# Patient Record
Sex: Male | Born: 1965 | Race: Black or African American | Hispanic: No | Marital: Married | Smoking: Former smoker
Health system: Southern US, Community
[De-identification: ages and names within clinical notes are randomized; demographics above are authoritative.]

## PROBLEM LIST (undated history)

## (undated) DIAGNOSIS — C9110 Chronic lymphocytic leukemia of B-cell type not having achieved remission: Secondary | ICD-10-CM

## (undated) DIAGNOSIS — T4145XA Adverse effect of unspecified anesthetic, initial encounter: Secondary | ICD-10-CM

## (undated) DIAGNOSIS — I10 Essential (primary) hypertension: Secondary | ICD-10-CM

## (undated) DIAGNOSIS — T8859XA Other complications of anesthesia, initial encounter: Secondary | ICD-10-CM

## (undated) DIAGNOSIS — N529 Male erectile dysfunction, unspecified: Secondary | ICD-10-CM

## (undated) DIAGNOSIS — E785 Hyperlipidemia, unspecified: Secondary | ICD-10-CM

## (undated) DIAGNOSIS — G4730 Sleep apnea, unspecified: Secondary | ICD-10-CM

## (undated) DIAGNOSIS — E119 Type 2 diabetes mellitus without complications: Secondary | ICD-10-CM

## (undated) HISTORY — PX: KNEE SURGERY: SHX244

## (undated) HISTORY — DX: Sleep apnea, unspecified: G47.30

## (undated) HISTORY — PX: ORIF PATELLA: SHX5033

## (undated) HISTORY — PX: COLONOSCOPY: SHX174

## (undated) HISTORY — PX: OTHER SURGICAL HISTORY: SHX169

## (undated) MED FILL — Dexamethasone Sodium Phosphate Inj 100 MG/10ML: INTRAMUSCULAR | Qty: 2 | Status: AC

---
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
## 2019-04-15 DIAGNOSIS — Z8616 Personal history of COVID-19: Secondary | ICD-10-CM

## 2019-04-15 HISTORY — DX: Personal history of COVID-19: Z86.16

## 2019-05-11 ENCOUNTER — Ambulatory Visit (HOSPITAL_COMMUNITY)
Admission: EM | Admit: 2019-05-11 | Discharge: 2019-05-11 | Disposition: A | Discharge status: Home / Self Care | Payer: 59 | Attending: Emergency Medicine | Admitting: Emergency Medicine

## 2019-05-11 ENCOUNTER — Other Ambulatory Visit: Payer: Self-pay

## 2019-05-11 ENCOUNTER — Encounter (HOSPITAL_COMMUNITY): Payer: Self-pay | Admitting: *Deleted

## 2019-05-11 DIAGNOSIS — J302 Other seasonal allergic rhinitis: Secondary | ICD-10-CM | POA: Insufficient documentation

## 2019-05-11 DIAGNOSIS — Z20828 Contact with and (suspected) exposure to other viral communicable diseases: Secondary | ICD-10-CM | POA: Diagnosis present

## 2019-05-11 DIAGNOSIS — Z20822 Contact with and (suspected) exposure to covid-19: Secondary | ICD-10-CM

## 2019-05-11 DIAGNOSIS — U071 COVID-19: Secondary | ICD-10-CM | POA: Insufficient documentation

## 2019-05-11 HISTORY — DX: Essential (primary) hypertension: I10

## 2019-05-11 HISTORY — DX: Hyperlipidemia, unspecified: E78.5

## 2019-05-11 HISTORY — DX: Type 2 diabetes mellitus without complications: E11.9

## 2019-05-11 NOTE — ED Provider Notes (Signed)
[place]    CSN: [id number] Arrival date & time: 05/11/19  3716     History   Chief Complaint No chief complaint on file.   HPI Ramces Shomaker is a 53 y.o. male with history of tobacco use, type 2 diabetes, hyperlipidemia, hypertension presenting for COVID testing.  States that his son, with whom he lives, tested positive on Saturday.  Patient has been well until 2 days ago, at which point he developed some nasal congestion, itchy/watery eyes.  Patient states that he sleeps under the fan at home and tends to get allergy flares around this time a year: Has not taken allergy medications yet.  Patient denies fever, myalgias, cough, shortness of breath.   Past Medical History:  Diagnosis Date  . Diabetes mellitus without complication ([place])   . Hyperlipidemia   . Hypertension     There are no active problems to display for this patient.   Past Surgical History:  Procedure Laterality Date  . ORIF PATELLA    . QUADRICEPS REPAIR         Home Medications    Prior to Admission medications   Medication Sig Start Date End Date Taking? Authorizing Provider  AMLODIPINE BENZOATE PO Take by mouth.   Yes [provider]  ASPIRIN PO Take by mouth.   Yes [provider]  Atorvastatin Calcium (LIPITOR PO) Take by mouth.   Yes [provider]  Empagliflozin (JARDIANCE PO) Take by mouth.   Yes [provider]  HYDROCHLOROTHIAZIDE PO Take by mouth.   Yes [provider]  Insulin Glargine (LANTUS [place]) Inject into the skin.   Yes [provider]  Liraglutide (VICTOZA [place]) Inject into the skin.   Yes [provider]  LOSARTAN POTASSIUM PO Take by mouth.   Yes [provider]  METFORMIN HCL PO Take by mouth.   Yes [provider]  METOPROLOL SUCCINATE PO Take by mouth.   Yes [provider]  SPIRONOLACTONE PO Take by mouth.   Yes [provider]  Tadalafil (CIALIS PO) Take by mouth.    Yes [provider]    Family History Family History  Problem Relation Age of Onset  . Cancer Mother   . Hypertension Mother   . Diabetes Mother   . Venous thrombosis Son     Social History Social History   Tobacco Use  . Smoking status: Current Every Day Smoker  . Smokeless tobacco: Never Used  Substance Use Topics  . Alcohol use: Yes    Comment: occasionally  . Drug use: Never     Allergies   Patient has no known allergies.   Review of Systems Review of Systems  Constitutional: Positive for fever. Negative for activity change and fatigue.  HENT: Positive for congestion, postnasal drip and rhinorrhea. Negative for dental problem, drooling, ear discharge, ear pain, facial swelling, hearing loss, mouth sores, nosebleeds, sinus pressure, sinus pain, sneezing, sore throat, tinnitus, trouble swallowing and voice change.   Eyes: Positive for discharge and itching. Negative for pain and redness.       Clear/watery discharge bilaterally  Respiratory: Negative for cough and shortness of breath.   Cardiovascular: Negative for chest pain and palpitations.  Gastrointestinal: Negative for abdominal pain, diarrhea, nausea and vomiting.  Endocrine: Negative for polydipsia, polyphagia and polyuria.  Genitourinary: Negative for dysuria, frequency and urgency.  Musculoskeletal: Negative for arthralgias and myalgias.  Skin: Negative for rash and wound.  Neurological: Negative for speech difficulty, light-headedness and headaches.  All other systems reviewed and are negative.    Physical Exam Triage Vital Signs ED Triage Vitals  Enc Vitals Group     BP      Pulse      Resp      Temp      Temp src      SpO2      Weight      Height      Head Circumference      Peak Flow      Pain Score      Pain Loc      Pain Edu?      Excl. in [place]?    No data found.  Updated Vital Signs BP 138/85   Pulse 74   Temp 99.5 F (37.5 C) (Temporal)   Resp 18   SpO2 99%     Physical Exam Constitutional:      General: He is not in acute distress. HENT:     Head: Normocephalic and atraumatic.     Right Ear: Tympanic membrane, ear canal and external ear normal.     Left Ear: Tympanic membrane, ear canal and external ear normal.     Nose: Rhinorrhea present.     Comments: Bilateral turbinate edema with mucosal pallor    Mouth/Throat:     Mouth: Mucous membranes are moist.     Pharynx: Oropharynx is clear. No oropharyngeal exudate or posterior oropharyngeal erythema.  Eyes:     General: No scleral icterus.    Extraocular Movements: Extraocular movements intact.     Pupils: Pupils are equal, round, and reactive to light.     Comments: Trace conjunctival injection bilaterally without edema or purulent discharge.  Neck:     Musculoskeletal: Neck supple. No muscular tenderness.  Cardiovascular:     Rate and Rhythm: Normal rate.  Pulmonary:     Effort: Pulmonary effort is normal.  Lymphadenopathy:     Cervical: No cervical adenopathy.  Skin:    Coloration: Skin is not jaundiced or pale.  Neurological:     Mental Status: He is alert and oriented to person, place, and time.      UC Treatments / Results  Labs (all labs ordered are listed, but only abnormal results are displayed) Labs Reviewed  NOVEL CORONAVIRUS, NAA (HOSPITAL ORDER, SEND-OUT TO REF LAB)    EKG   Radiology No results found.  Procedures Procedures (including critical care time)  Medications Ordered in UC Medications - No data to display  Initial Impression / Assessment and Plan / UC Course  I have reviewed the triage vital signs and the nursing notes.  Pertinent labs & imaging results that were available during my care of the patient were reviewed by me and considered in my medical decision making (see chart for details).     1.  Seasonal allergic rhinitis Will treat with OTC allergy occasions including Flonase, oral and topical ophthalmic antihistamines.  2.  Close  exposure to COVID-19 COVID test pending, encourage patient to take Tylenol for any elevated temperatures, or should he develop myalgias.  Return precautions discussed, patient verbalized understanding and is agreeable to plan. Final Clinical Impressions(s) / UC Diagnoses   Final diagnoses:  Seasonal allergic rhinitis, unspecified trigger  Close Exposure to Covid-19 Virus     Discharge Instructions     COVID test pending: Important to stay home and isolate until test results are back. Return if you develop fever, cough, shortness of breath.  Important to take allergy medication,  Flonase intranasal steroid, allergy eyedrops for symptom relief.    ED Prescriptions    None     Controlled Substance Prescriptions [place] Controlled Substance Registry consulted? Not Applicable   Quincy Sheehan, [place] 05/11/19 9791

## 2019-05-11 NOTE — ED Triage Notes (Addendum)
Requesting Covid test, as son (living in same household) tested positive.  Denies any c/o's, but noted bilat eye redness and some nasal congestion.

## 2019-05-11 NOTE — Discharge Instructions (Addendum)
COVID test pending: Important to stay home and isolate until test results are back. Return if you develop fever, cough, shortness of breath.  Important to take allergy medication, Flonase intranasal steroid, allergy eyedrops for symptom relief.

## 2019-05-13 LAB — NOVEL CORONAVIRUS, NAA (HOSP ORDER, SEND-OUT TO REF LAB; TAT 18-24 HRS): SARS-CoV-2, NAA: DETECTED — AB

## 2019-05-14 ENCOUNTER — Telehealth (HOSPITAL_COMMUNITY): Payer: Self-pay | Admitting: Emergency Medicine

## 2019-05-14 NOTE — Telephone Encounter (Signed)
Your test for COVID-19 was positive, meaning that you were infected with the novel coronavirus and could give the germ to others.  Please continue isolation at home, for at least 10 days since the start of your fever/cough/breathlessness and until you have had 3 consecutive days without fever (without taking a fever reducer) and with cough/breathlessness improving. Please continue good preventive care measures, including:  frequent hand-washing, avoid touching your face, cover coughs/sneezes, stay out of crowds and keep a 6 foot distance from others.  Recheck or go to the nearest hospital ED tent for re-assessment if fever/cough/breathlessness return.  Patient contacted and made aware of    results, all questions answered

## 2019-05-22 ENCOUNTER — Ambulatory Visit (HOSPITAL_COMMUNITY)
Admission: EM | Admit: 2019-05-22 | Discharge: 2019-05-22 | Disposition: A | Discharge status: Home / Self Care | Payer: 59 | Attending: Family Medicine | Admitting: Family Medicine

## 2019-05-22 ENCOUNTER — Other Ambulatory Visit: Payer: Self-pay

## 2019-05-22 ENCOUNTER — Encounter (HOSPITAL_COMMUNITY): Payer: Self-pay | Admitting: Emergency Medicine

## 2019-05-22 DIAGNOSIS — E119 Type 2 diabetes mellitus without complications: Secondary | ICD-10-CM | POA: Diagnosis not present

## 2019-05-22 DIAGNOSIS — Z7982 Long term (current) use of aspirin: Secondary | ICD-10-CM | POA: Insufficient documentation

## 2019-05-22 DIAGNOSIS — Z794 Long term (current) use of insulin: Secondary | ICD-10-CM | POA: Diagnosis not present

## 2019-05-22 DIAGNOSIS — U071 COVID-19: Secondary | ICD-10-CM | POA: Diagnosis present

## 2019-05-22 DIAGNOSIS — Z79899 Other long term (current) drug therapy: Secondary | ICD-10-CM | POA: Diagnosis not present

## 2019-05-22 DIAGNOSIS — Z20828 Contact with and (suspected) exposure to other viral communicable diseases: Secondary | ICD-10-CM

## 2019-05-22 DIAGNOSIS — I10 Essential (primary) hypertension: Secondary | ICD-10-CM

## 2019-05-22 DIAGNOSIS — F17200 Nicotine dependence, unspecified, uncomplicated: Secondary | ICD-10-CM | POA: Insufficient documentation

## 2019-05-22 DIAGNOSIS — Z20822 Contact with and (suspected) exposure to covid-19: Secondary | ICD-10-CM

## 2019-05-22 DIAGNOSIS — Z0289 Encounter for other administrative examinations: Secondary | ICD-10-CM | POA: Diagnosis not present

## 2019-05-22 DIAGNOSIS — E785 Hyperlipidemia, unspecified: Secondary | ICD-10-CM | POA: Insufficient documentation

## 2019-05-22 NOTE — Discharge Instructions (Signed)
MAY RETURN TO WORK NEXT WEEK ONCE CONFIRMATORY NEGATIVE TEST RETURNS

## 2019-05-22 NOTE — ED Provider Notes (Signed)
[place]    CSN: [id number] Arrival date & time: 05/22/19  9767      History   Chief Complaint Chief Complaint  Patient presents with  . covid test  . Letter for School/Work    HPI Lilburn Straw is a 53 y.o. male history of hypertension, hyperlipidemia, DM type II presenting today for evaluation of cover testing.  Patient tested positive on 7/27 has been quarantining at home for the past 2 weeks.  Initially had some mild URI symptoms and some mild diarrhea, but this resolved after 3 to 4 days and has felt normal over the past 7 to 10 days.  He denies any fevers.  Denies chills, body aches.  Tolerating oral intake.  Denies cough, congestion or sore throat.  His son is also tested positive, but recently had a negative result.  He is returning for COVID testing to ensure safety to return to work.   HPI  Past Medical History:  Diagnosis Date  . Diabetes mellitus without complication ([place])   . Hyperlipidemia   . Hypertension     There are no active problems to display for this patient.   Past Surgical History:  Procedure Laterality Date  . ORIF PATELLA    . QUADRICEPS REPAIR         Home Medications    Prior to Admission medications   Medication Sig Start Date End Date Taking? Authorizing Provider  AMLODIPINE BENZOATE PO Take by mouth.    [provider]  ASPIRIN PO Take by mouth.    [provider]  Atorvastatin Calcium (LIPITOR PO) Take by mouth.    [provider]  Empagliflozin (JARDIANCE PO) Take by mouth.    [provider]  HYDROCHLOROTHIAZIDE PO Take by mouth.    [provider]  Insulin Glargine (LANTUS [place]) Inject into the skin.    [provider]  Liraglutide (VICTOZA [place]) Inject into the skin.    [provider]  LOSARTAN POTASSIUM PO Take by mouth.    [provider]  METFORMIN HCL PO Take by mouth.    [provider]  METOPROLOL SUCCINATE PO Take by mouth.     [provider]  SPIRONOLACTONE PO Take by mouth.    [provider]  Tadalafil (CIALIS PO) Take by mouth.    [provider]    Family History Family History  Problem Relation Age of Onset  . Cancer Mother   . Hypertension Mother   . Diabetes Mother   . Venous thrombosis Son     Social History Social History   Tobacco Use  . Smoking status: Current Every Day Smoker  . Smokeless tobacco: Never Used  Substance Use Topics  . Alcohol use: Yes    Comment: occasionally  . Drug use: Never     Allergies   Patient has no known allergies.   Review of Systems Review of Systems  Constitutional: Negative for activity change, appetite change, chills, fatigue and fever.  HENT: Negative for congestion, ear pain, rhinorrhea, sinus pressure, sore throat and trouble swallowing.   Eyes: Negative for discharge and redness.  Respiratory: Negative for cough, chest tightness and shortness of breath.   Cardiovascular: Negative for chest pain.  Gastrointestinal: Negative for abdominal pain, diarrhea, nausea and vomiting.  Musculoskeletal: Negative for myalgias.  Skin: Negative for rash.  Neurological: Negative for dizziness, light-headedness and headaches.     Physical Exam Triage Vital Signs ED Triage Vitals [05/22/19 1022]  Enc Vitals Group  BP (!) 154/95     Pulse Rate 67     Resp 18     Temp 98.1 F (36.7 C)     Temp Source Temporal     SpO2 97 %     Weight      Height      Head Circumference      Peak Flow      Pain Score 0     Pain Loc      Pain Edu?      Excl. in [place]?    No data found.  Updated Vital Signs BP (!) 154/95 (BP Location: Left Arm)   Pulse 67   Temp 98.1 F (36.7 C) (Temporal)   Resp 18   SpO2 97%   Visual Acuity Right Eye Distance:   Left Eye Distance:   Bilateral Distance:    Right Eye Near:   Left Eye Near:    Bilateral Near:     Physical Exam Vitals signs and nursing note reviewed.  Constitutional:       Appearance: He is well-developed.     Comments: No acute distress, well-appearing  HENT:     Head: Normocephalic and atraumatic.  Eyes:     Conjunctiva/sclera: Conjunctivae normal.  Neck:     Musculoskeletal: Neck supple.  Cardiovascular:     Rate and Rhythm: Normal rate and regular rhythm.     Heart sounds: No murmur.  Pulmonary:     Effort: Pulmonary effort is normal. No respiratory distress.     Breath sounds: Normal breath sounds.     Comments: Breathing comfortably at rest, CTABL, no wheezing, rales or other adventitious sounds auscultated  No coughing during visit Abdominal:     Palpations: Abdomen is soft.     Tenderness: There is no abdominal tenderness.  Skin:    General: Skin is warm and dry.  Neurological:     Mental Status: He is alert.      UC Treatments / Results  Labs (all labs ordered are listed, but only abnormal results are displayed) Labs Reviewed  NOVEL CORONAVIRUS, NAA (HOSPITAL ORDER, SEND-OUT TO REF LAB)    EKG   Radiology No results found.  Procedures Procedures (including critical care time)  Medications Ordered in UC Medications - No data to display  Initial Impression / Assessment and Plan / UC Course  I have reviewed the triage vital signs and the nursing notes.  Pertinent labs & imaging results that were available during my care of the patient were reviewed by me and considered in my medical decision making (see chart for details).     Repeat COVID testing obtained.  Vital signs stable.  Advised patient he may return to remote pending negative testing.  Work forms filled out, feel patient is safe to return with negative testing.Discussed strict return precautions. Patient verbalized understanding and is agreeable with plan.  Final Clinical Impressions(s) / UC Diagnoses   Final diagnoses:  Suspected Covid-19 Virus Infection     Discharge Instructions     MAY RETURN TO WORK NEXT WEEK ONCE CONFIRMATORY NEGATIVE TEST RETURNS    ED Prescriptions    None     Controlled Substance Prescriptions [place] Controlled Substance Registry consulted? Not Applicable   Janith Lima, [place] 05/22/19 1049

## 2019-05-22 NOTE — ED Triage Notes (Signed)
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

## 2019-05-25 ENCOUNTER — Telehealth (HOSPITAL_COMMUNITY): Payer: Self-pay | Admitting: Emergency Medicine

## 2019-05-25 LAB — NOVEL CORONAVIRUS, NAA (HOSP ORDER, SEND-OUT TO REF LAB; TAT 18-24 HRS): SARS-CoV-2, NAA: DETECTED — AB

## 2019-05-25 NOTE — Telephone Encounter (Signed)
Spoke with patient on the phone, pt states he has no symptoms whatsoever and hasn't had symptoms for over ten days. Per CDC guidelines, detected after this time could be false positive from old virus, it does NOT mean pt is till contagious. Reviewed chart with Dr. Meda Coffee, pt meets criteria to return to work based on no symptoms for over 10 days, no fever, and symptoms 100% resolved. Wrote patient a work note stating he meets CDC guidelines and no longer needs to quarantine. All questions answered.

## 2019-07-09 ENCOUNTER — Other Ambulatory Visit: Payer: Self-pay

## 2019-07-13 ENCOUNTER — Ambulatory Visit: Payer: 59 | Admitting: Internal Medicine

## 2019-07-13 ENCOUNTER — Other Ambulatory Visit: Payer: Self-pay

## 2019-07-13 ENCOUNTER — Encounter: Payer: Self-pay | Admitting: Internal Medicine

## 2019-07-13 VITALS — BP 138/86 | HR 86 | Temp 98.3°F | Ht 71.89 in | Wt 265.8 lb

## 2019-07-13 DIAGNOSIS — R739 Hyperglycemia, unspecified: Secondary | ICD-10-CM

## 2019-07-13 DIAGNOSIS — E1165 Type 2 diabetes mellitus with hyperglycemia: Secondary | ICD-10-CM

## 2019-07-13 DIAGNOSIS — Z794 Long term (current) use of insulin: Secondary | ICD-10-CM | POA: Diagnosis not present

## 2019-07-13 LAB — POCT GLYCOSYLATED HEMOGLOBIN (HGB A1C): Hemoglobin A1C: 7.7 % — AB (ref 4.0–5.6)

## 2019-07-13 MED ORDER — OZEMPIC (0.25 OR 0.5 MG/DOSE) 2 MG/1.5ML ~~LOC~~ SOPN: 0.5000 mg | PEN_INJECTOR | SUBCUTANEOUS | 6 refills | Status: DC

## 2019-07-13 NOTE — Patient Instructions (Addendum)
-   Stop Victoza  - Start Ozempic 0.5 mg weekly  - Continue Jardiance 25 mg daily  - Continue Metformin 1000 mg Twice daily  - Decrease Lantus to 36 units daily     - Check sugar fasting and bedtime    - HOW TO TREAT LOW BLOOD SUGARS (Blood sugar LESS THAN 70 MG/DL)  Please follow the RULE OF 15 for the treatment of hypoglycemia treatment (when your (blood sugars are less than 70 mg/dL)    STEP 1: Take 15 grams of carbohydrates when your blood sugar is low, which includes:   3-4 GLUCOSE TABS  OR  3-4 OZ OF JUICE OR REGULAR SODA OR  ONE TUBE OF GLUCOSE GEL     STEP 2: RECHECK blood sugar in 15 MINUTES STEP 3: If your blood sugar is still low at the 15 minute recheck --> then, go back to STEP 1 and treat AGAIN with another 15 grams of carbohydrates.

## 2019-07-13 NOTE — Progress Notes (Signed)
Name: James Parrish  MRN/ DOB: [id number], 1966/07/21   Age/ Sex: 53 y.o., male    PCP: System, Provider Not In   Reason for Endocrinology Evaluation: Type 2 Diabetes Mellitus     Date of Initial Endocrinology Visit: 07/13/2019     PATIENT IDENTIFIER: James Parrish is a 53 y.o. male with a past medical history of HTN, Dyslipidemia . The patient presented for initial endocrinology clinic visit on 07/13/2019 for consultative assistance with his diabetes management.    HPI: James Parrish was    Diagnosed with DM > 10 yrs ago  Prior Medications tried/Intolerance: insulin started in 2013 Currently checking blood sugars 3 x / day,  before meals  Hypoglycemia episodes : no                Hemoglobin A1c has ranged from 7.2% in 2018, peaking at 8.8% in 2019. Patient required assistance for hypoglycemia: no Patient has required hospitalization within the last 1 year from hyper or hypoglycemia: no  In terms of diet, the patient eats 2-3 meals, snacks at bedtime . Avoids sugar-sweetened beverages.    Truck driver    HOME DIABETES REGIMEN: Lantus 40 units daily  Victoza 1.8 mg daily  Metformin 1000 mg BID  Jardiance 25 mg daily   Statin: Yes  ACE-I/ARB: Yes Prior Diabetic Education: No   METER DOWNLOAD SUMMARY:unable to download 102- 180's   Mg/dL   DIABETIC COMPLICATIONS: Microvascular complications:    Denies: CKD, retinopathy, neuropathy   Last eye exam: Completed 2020  Macrovascular complications:    Denies: CAD, PVD, CVA   PAST HISTORY: Past Medical History:  Past Medical History:  Diagnosis Date  . Diabetes mellitus without complication ([place])   . Hyperlipidemia   . Hypertension     Past Surgical History:  Past Surgical History:  Procedure Laterality Date  . ORIF PATELLA    . QUADRICEPS REPAIR        Social History:  reports that he has been smoking. He has never used smokeless tobacco. He reports current alcohol use. He reports that he does not use  drugs. Family History:  Family History  Problem Relation Age of Onset  . Cancer Mother   . Hypertension Mother   . Diabetes Mother   . Venous thrombosis Son       HOME MEDICATIONS: Allergies as of 07/13/2019   No Known Allergies     Medication List       Accurate as of July 13, 2019  2:55 PM. If you have any questions, ask your nurse or doctor.        AMLODIPINE BENZOATE PO Take by mouth.   ASPIRIN PO Take 81 mg by mouth daily.   CIALIS PO Take by mouth.   HYDROCHLOROTHIAZIDE PO Take by mouth.   JARDIANCE PO Take by mouth.   LANTUS [place] Inject 40 Units into the skin.   LIPITOR PO Take by mouth.   LOSARTAN POTASSIUM PO Take by mouth.   METFORMIN HCL PO Take 1,000 mg by mouth. 2 pills 2 times daily   METOPROLOL SUCCINATE PO Take by mouth.   SPIRONOLACTONE PO Take by mouth.   VICTOZA [place] Inject 1.8 Units into the skin.        ALLERGIES: No Known Allergies   REVIEW OF SYSTEMS: A comprehensive ROS was conducted with the patient and is negative except as per HPI and below:  Review of Systems  Constitutional: Negative for chills and fever.  HENT: Negative for congestion  and sore throat.   Respiratory: Negative for cough and hemoptysis.   Cardiovascular: Negative for chest pain and palpitations.  Gastrointestinal: Negative for nausea and vomiting.  Genitourinary: Negative for frequency.  Skin: Negative.   Neurological: Negative for tingling and tremors.  Endo/Heme/Allergies: Negative for polydipsia.  Psychiatric/Behavioral: Negative for depression.      OBJECTIVE:   VITAL SIGNS: BP 138/86 (BP Location: Left Arm, Patient Position: Sitting, Cuff Size: Large)   Pulse 86   Temp 98.3 F (36.8 C)   Ht 5' 11.89" (1.826 m)   Wt 265 lb 12.8 oz (120.6 kg)   SpO2 98%   BMI 36.16 kg/m    PHYSICAL EXAM:  General: Pt appears well and is in NAD  Hydration: Well-hydrated with moist mucous membranes and good skin turgor  HEENT: Head:  Unremarkable with good dentition. Oropharynx clear without exudate.  Eyes: External eye exam normal without stare, lid lag or exophthalmos.  EOM intact.  Neck: General: Supple without adenopathy or carotid bruits. Thyroid: Thyroid size normal.  No goiter or nodules appreciated. No thyroid bruit.  Lungs: Clear with good BS bilat with no rales, rhonchi, or wheezes  Heart: RRR with normal S1 and S2 and no gallops; no murmurs; no rub  Abdomen: Normoactive bowel sounds, soft, nontender, without masses or organomegaly palpable  Extremities:  Lower extremities - No pretibial edema. No lesions.  Skin: Normal texture and temperature to palpation. No rash noted. No Acanthosis nigricans/skin tags. Bilateral lower quadrant  lipohypertrophy.  Neuro: MS is good with appropriate affect, pt is alert and Ox3    DM foot exam: 07/13/2019  The skin of the feet is intact without sores or ulcerations. The pedal pulses are 2+ on right and 2+ on left. The sensation is intact to a screening 5.07, 10 gram monofilament bilaterally   DATA REVIEWED:    Results for James Parrish, James Parrish (MRN [id number]) as of 07/14/2019 15:04  Ref. Range 07/13/2019 16:09  Sodium Latest Ref Range: 135 - 145 mEq/L 136  Potassium Latest Ref Range: 3.5 - 5.1 mEq/L 3.9  Chloride Latest Ref Range: 96 - 112 mEq/L 101  CO2 Latest Ref Range: 19 - 32 mEq/L 24  Glucose Latest Ref Range: 70 - 99 mg/dL 127 (H)  BUN Latest Ref Range: 6 - 23 mg/dL 21  Creatinine Latest Ref Range: 0.40 - 1.50 mg/dL 1.10  Calcium Latest Ref Range: 8.4 - 10.5 mg/dL 10.1  GFR Latest Ref Range: >60.00 mL/min 83.92   ASSESSMENT / PLAN / RECOMMENDATIONS:   1) Type 2 Diabetes Mellitus, Sub-optimally controlled, Without complications - Most recent A1c of 7.7  %. Goal A1c < 7.0 %.    - Praised the patient on improved glucose control , his A1c has trended down from 8.2% - Pt does need dietary education, will refer him to our CDE.  - We discussed switching victoza to a once  weekly GLP-1 agonist, which he agreed to . - We also discussed avoiding the lower abdominal quadrants as injection sites for the next 6 months due to lipohyptrophy  MEDICATIONS: - Stop Victoza  - Start Ozempic 0.5 mg weekly  - Continue Jardiance 25 mg daily  - Continue Metformin 1000 mg Twice daily  - Decrease Lantus to 36 units daily   EDUCATION / INSTRUCTIONS:  BG monitoring instructions: Patient is instructed to check his blood sugars 2 times a day, fasting and bedtime.  Call [facility] if: BG persistently < 70 or > 300. . I reviewed the Rule of  15 for the treatment of hypoglycemia in detail with the patient. Literature supplied.   2) Diabetic complications:   Eye: Does not have known diabetic retinopathy.   Neuro/ Feet: Does not have known diabetic peripheral neuropathy.  Renal: Patient does not have known baseline CKD. He is on an ACEI/ARB at present.Check urine albumin/creatinine ratio yearly starting at time of diagnosis. If albuminuria is positive, treatment is geared toward better glucose, blood pressure control and use of ACE inhibitors or ARBs. Monitor electrolytes and creatinine once to twice yearly.   3) Lipids: Patient is  on a statin.    4) Hypertension: He is  at goal of < 140/90 mmHg.    F/u in 3 months    Signed electronically by: Mack Guise, MD  [facility] End[facility]  [facility] [address] [place], [place] [address] Phone: [phone] FAX: [phone]   CC: System, Provider Not In No address on file Phone: None  Fax: None    Return to Endocrinology clinic as below: Future Appointments  Date Time Provider [place]  07/13/2019  3:20 PM Shamleffer, Melanie Crazier, MD [facility] None

## 2019-07-14 ENCOUNTER — Encounter: Payer: Self-pay | Admitting: Internal Medicine

## 2019-07-14 LAB — BASIC METABOLIC PANEL
BUN: 21 mg/dL (ref 6–23)
CO2: 24 mEq/L (ref 19–32)
Calcium: 10.1 mg/dL (ref 8.4–10.5)
Chloride: 101 mEq/L (ref 96–112)
Creatinine, Ser: 1.1 mg/dL (ref 0.40–1.50)
GFR: 83.92 mL/min (ref >60.00)
Glucose, Bld: 127 mg/dL — ABNORMAL HIGH (ref 70–99)
Potassium: 3.9 mEq/L (ref 3.5–5.1)
Sodium: 136 mEq/L (ref 135–145)

## 2019-08-25 ENCOUNTER — Ambulatory Visit: Payer: 59 | Admitting: Registered"

## 2019-09-08 ENCOUNTER — Ambulatory Visit: Payer: 59 | Admitting: Dietician

## 2019-10-13 ENCOUNTER — Ambulatory Visit: Payer: 59 | Admitting: Internal Medicine

## 2019-10-13 ENCOUNTER — Other Ambulatory Visit: Payer: Self-pay

## 2019-10-13 VITALS — BP 138/92 | HR 93 | Temp 98.1°F | Ht 73.0 in | Wt 273.6 lb

## 2019-10-13 DIAGNOSIS — Z794 Long term (current) use of insulin: Secondary | ICD-10-CM | POA: Diagnosis not present

## 2019-10-13 DIAGNOSIS — E1165 Type 2 diabetes mellitus with hyperglycemia: Secondary | ICD-10-CM | POA: Diagnosis not present

## 2019-10-13 LAB — POCT GLYCOSYLATED HEMOGLOBIN (HGB A1C): Hemoglobin A1C: 7.2 % — AB (ref 4.0–5.6)

## 2019-10-13 NOTE — Progress Notes (Signed)
Name: Danial Rueff  Age/ Sex: 53 y.o., male   MRN/ DOB: [id number], April 02, 1966     PCP: Craig Guess, MD   Reason for Endocrinology Evaluation: Type 2 Diabetes Mellitus  Initial Endocrine Consultative Visit: 07/13/2019    PATIENT IDENTIFIER: Mr. Harjot Palmateer is a 53 y.o. male with a past medical history of T2Dm, HTN and Dyslipidemia. The patient has followed with Endocrinology clinic since 07/13/2019 for consultative assistance with management of his diabetes.  DIABETIC HISTORY:  Mr. Stallard was diagnosed with DM prior to 2010, was initially on oral glycemic agents, insulin started in 2013. His hemoglobin A1c has ranged from 7.2% in 2018, peaking at 8.8% in 2019.  On his initial visit to our clinic, his A1c 7.7% . He was on lantus, victoza, metformin and Jardiance. We switched Victoza to Ozempic.    He is a Administrator SUBJECTIVE:   During the last visit (07/13/2019): A1c 7.7% Switched Victoza to Ozempic, continued metformin, jardiance and lantus   Today (10/13/2019): Mr. Mcguigan is here for a 3 month follow up on diabetes.  He checks his blood sugars   2 times . The patient has not had hypoglycemic episodes since the last clinic visit. Otherwise, the patient has not required any recent emergency interventions for hypoglycemia and has not had recent hospitalizations secondary to hyper or hypoglycemic episodes.    ROS: As per HPI and as detailed below: Review of Systems  Constitutional: Negative for chills and fever.  Respiratory: Negative for cough and shortness of breath.   Cardiovascular: Negative for chest pain and palpitations.  Gastrointestinal: Negative for diarrhea and nausea.      HOME DIABETES REGIMEN:  Ozempic 0.5 mg weekly  Jardiance 25 mg daily  Metformin 1000 mg Twice daily  Lantus 36 units daily      DIABETIC COMPLICATIONS: Microvascular complications:   Denies: CKD, retinopathy, neuropathy  Last eye exam: Completed 2020  Macrovascular  complications:   Denies: CAD, PVD, CVA  METER DOWNLOAD SUMMARY: 12/2-12/29/20 Fingerstick Blood Glucose Tests = 42 Average Number Tests/Day = 1.5 Overall Mean FS Glucose = 138 Standard Deviation = 25  BG Ranges: Low = 88 High = 184  BG Target % Results: % In target = 98 % Over target = 2 % Under target = 0  Hypoglycemic Events/30 Days: BG < 50 = 0 Episodes of symptomatic severe hypoglycemia = 0    HISTORY:  Past Medical History:  Past Medical History:  Diagnosis Date  . Diabetes mellitus without complication ([place])   . Hyperlipidemia   . Hypertension    Past Surgical History:  Past Surgical History:  Procedure Laterality Date  . ORIF PATELLA    . QUADRICEPS REPAIR     Social History:  reports that he has been smoking. He has never used smokeless tobacco. He reports current alcohol use. He reports that he does not use drugs. Family History:  Family History  Problem Relation Age of Onset  . Cancer Mother   . Hypertension Mother   . Diabetes Mother   . Venous thrombosis Son      HOME MEDICATIONS: Allergies as of 10/13/2019   No Known Allergies     Medication List       Accurate as of October 13, 2019  3:43 PM. If you have any questions, ask your nurse or doctor.        AMLODIPINE BENZOATE PO Take by mouth.   ASPIRIN PO Take 81 mg by mouth daily.   CIALIS  PO Take by mouth.   HYDROCHLOROTHIAZIDE PO Take by mouth.   JARDIANCE PO Take by mouth.   LANTUS [place] Inject 36 Units into the skin.   LIPITOR PO Take by mouth.   LOSARTAN POTASSIUM PO Take by mouth.   METFORMIN HCL PO Take 1,000 mg by mouth. 2 pills 2 times daily   METOPROLOL SUCCINATE PO Take by mouth.   Ozempic (0.25 or 0.5 MG/DOSE) 2 MG/1.5ML Sopn Generic drug: Semaglutide(0.25 or 0.5MG /DOS) Inject 0.5 mg into the skin once a week.   SPIRONOLACTONE PO Take by mouth.   VICTOZA [place] Inject 1.8 Units into the skin.        OBJECTIVE:   Vital Signs: BP (!) 138/92 (BP  Location: Left Arm, Patient Position: Sitting, Cuff Size: Large)   Pulse 93   Temp 98.1 F (36.7 C)   Ht 6\' 1"  (1.854 m)   Wt 273 lb 9.6 oz (124.1 kg)   SpO2 96%   BMI 36.10 kg/m   Wt Readings from Last 3 Encounters:  10/13/19 273 lb 9.6 oz (124.1 kg)  07/13/19 265 lb 12.8 oz (120.6 kg)     Exam: General: Pt appears well and is in NAD  Neck: General: Supple without adenopathy. Thyroid: Thyroid size normal.  No goiter or nodules appreciated. No thyroid bruit.  Lungs: Clear with good BS bilat with no rales, rhonchi, or wheezes  Heart: RRR with normal S1 and S2 and no gallops; no murmurs; no rub  Abdomen: Normoactive bowel sounds, soft, nontender, without masses or organomegaly palpable  Extremities: No pretibial edema. No tremor. Normal strength and motion throughout. See detailed diabetic foot exam below.  Skin: Normal texture and temperature to palpation. No rash noted. No Acanthosis nigricans/skin tags. No lipohypertrophy.  Neuro: MS is good with appropriate affect, pt is alert and Ox3    DM foot exam: 07/13/2019  The skin of the feet is intact without sores or ulcerations. The pedal pulses are 2+ on right and 2+ on left. The sensation is intact to a screening 5.07, 10 gram monofilament bilaterally    DATA REVIEWED:  Lab Results  Component Value Date   HGBA1C 7.7 (A) 07/13/2019   Lab Results  Component Value Date   CREATININE 1.10 07/13/2019    08/05/19  Microalb/cr ratio 5.5  LDL 44 mg/dL   ASSESSMENT / PLAN / RECOMMENDATIONS:   1) Type 2 Diabetes Mellitus, acceptable control, Without  complications - Most recent A1c of 7.2 %. Goal A1c < 7.0 %.    - Praised pt on the great work in improving glycemic control. He believed there's room to improve especially at evening snacks.  - His current meter is not covered, he was instructed to contact the insurance company and notify us with the preferred glucose meter, will be happy to write a prescription for one.  -  We discussed low carb options for snacks or limiting amount of fruits at bedtime - No changes will be made today    MEDICATIONS: - Ozempic 0.5 mg weekly  - Jardiance 25 mg daily  - Metformin 1000 mg Twice daily  - Lantus 36 units daily    EDUCATION / INSTRUCTIONS: BG monitoring instructions: Patient is instructed to check his blood sugars 2 times a day, fasting and bedtime. Call [facility] if: BG persistently < 70 or > 300. I reviewed the Rule of 15 for the treatment of hypoglycemia in detail with the patient. Literature supplied.      F/U in 3  months    Signed electronically by: Mack Guise, MD  [facility] Endocrinology  [facility] [address] [place], [place] [address] Phone: [phone] FAX: [phone]   CC: Craig Guess, MD [place] [address] Phone: [phone]  Fax: [phone]  Return to Endocrinology clinic as below: No future appointments.

## 2019-10-13 NOTE — Patient Instructions (Signed)
-   Ozempic 0.5 mg weekly  - Jardiance 25 mg daily  - Metformin 1000 mg Twice daily  - Lantus 36 units daily       - HOW TO TREAT LOW BLOOD SUGARS (Blood sugar LESS THAN 70 MG/DL)  Please follow the RULE OF 15 for the treatment of hypoglycemia treatment (when your (blood sugars are less than 70 mg/dL)    STEP 1: Take 15 grams of carbohydrates when your blood sugar is low, which includes:   3-4 GLUCOSE TABS  OR  3-4 OZ OF JUICE OR REGULAR SODA OR  ONE TUBE OF GLUCOSE GEL     STEP 2: RECHECK blood sugar in 15 MINUTES STEP 3: If your blood sugar is still low at the 15 minute recheck --> then, go back to STEP 1 and treat AGAIN with another 15 grams of carbohydrates.

## 2019-10-14 ENCOUNTER — Encounter: Payer: Self-pay | Admitting: Internal Medicine

## 2019-10-27 ENCOUNTER — Ambulatory Visit (HOSPITAL_COMMUNITY)
Admission: EM | Admit: 2019-10-27 | Discharge: 2019-10-27 | Disposition: A | Discharge status: Home / Self Care | Payer: 59 | Attending: Family Medicine | Admitting: Family Medicine

## 2019-10-27 ENCOUNTER — Encounter (HOSPITAL_COMMUNITY): Payer: Self-pay

## 2019-10-27 ENCOUNTER — Other Ambulatory Visit: Payer: Self-pay

## 2019-10-27 DIAGNOSIS — Z20822 Contact with and (suspected) exposure to covid-19: Secondary | ICD-10-CM | POA: Diagnosis present

## 2019-10-27 DIAGNOSIS — Z8616 Personal history of COVID-19: Secondary | ICD-10-CM | POA: Insufficient documentation

## 2019-10-27 NOTE — Discharge Instructions (Signed)
If your Covid-19 test is positive, you will receive a phone call from [facility] regarding your results. Negative test results are not called. Both positive and negative results area always visible on MyChart. If you do not have a MyChart account, sign up instructions are in your discharge papers.   Persons who are directed to care for themselves at home may discontinue isolation under the following conditions:   At least 10 days have passed since symptom onset and  At least 24 hours have passed without running a fever (this means without the use of fever-reducing medications) and  Other symptoms have improved.  Persons infected with COVID-19 who never develop symptoms may discontinue isolation and other precautions 10 days after the date of their first positive COVID-19 test.  

## 2019-10-27 NOTE — ED Provider Notes (Signed)
[facility]    CSN: [id number] Arrival date & time: 10/27/19  1820      History   Chief Complaint No chief complaint on file.   HPI James Parrish is a 54 y.o. male.   Patient reports to urgent care today for covid testing due to recent exposure to covid positive person. His wife accompanies him for testing as well, and she has had some mild symptoms as well. The exposure was from a sick child in the family. Patient currently denies all symptoms.  Of note, he was covid positive in July of this year.      Past Medical History:  Diagnosis Date  . Diabetes mellitus without complication ([place])   . Hyperlipidemia   . Hypertension     There are no problems to display for this patient.   Past Surgical History:  Procedure Laterality Date  . ORIF PATELLA    . QUADRICEPS REPAIR         Home Medications    Prior to Admission medications   Medication Sig Start Date End Date Taking? Authorizing Provider  AMLODIPINE BENZOATE PO Take by mouth.    [provider]  ASPIRIN PO Take 81 mg by mouth daily.     [provider]  Atorvastatin Calcium (LIPITOR PO) Take by mouth.    [provider]  Empagliflozin (JARDIANCE PO) Take by mouth.    [provider]  HYDROCHLOROTHIAZIDE PO Take by mouth.    [provider]  Insulin Glargine (LANTUS [place]) Inject 36 Units into the skin.     [provider]  LOSARTAN POTASSIUM PO Take by mouth.    [provider]  METFORMIN HCL PO Take 1,000 mg by mouth. 2 pills 2 times daily    [provider]  METOPROLOL SUCCINATE PO Take by mouth.    [provider]  Semaglutide,0.25 or 0.5MG /DOS, (OZEMPIC, 0.25 OR 0.5 MG/DOSE,) 2 MG/1.5ML SOPN Inject 0.5 mg into the skin once a week. 07/13/19   Shamleffer, Melanie Crazier, MD  SPIRONOLACTONE PO Take by mouth.    [provider]  Tadalafil (CIALIS PO) Take by mouth.    [provider]    Family  History Family History  Problem Relation Age of Onset  . Cancer Mother   . Hypertension Mother   . Diabetes Mother   . Venous thrombosis Son     Social History Social History   Tobacco Use  . Smoking status: Current Every Day Smoker  . Smokeless tobacco: Never Used  Substance Use Topics  . Alcohol use: Yes    Comment: occasionally  . Drug use: Never     Allergies   Patient has no known allergies.   Review of Systems Review of Systems  Constitutional: Negative for chills and fever.  HENT: Negative for congestion, ear pain, rhinorrhea, sinus pressure, sinus pain and sore throat.   Eyes: Negative for pain and visual disturbance.  Respiratory: Negative for cough and shortness of breath.   Cardiovascular: Negative for chest pain and palpitations.  Gastrointestinal: Negative for abdominal pain, diarrhea, nausea and vomiting.  Genitourinary: Negative for dysuria and hematuria.  Musculoskeletal: Negative for arthralgias, back pain and myalgias.  Skin: Negative for color change and rash.  Neurological: Negative for seizures, syncope and headaches.  All other systems reviewed and are negative.    Physical Exam Triage Vital Signs ED Triage Vitals  Enc Vitals Group     BP      Pulse  Resp      Temp      Temp src      SpO2      Weight      Height      Head Circumference      Peak Flow      Pain Score      Pain Loc      Pain Edu?      Excl. in [place]?    No data found.  Updated Vital Signs There were no vitals taken for this visit.  Visual Acuity Right Eye Distance:   Left Eye Distance:   Bilateral Distance:    Right Eye Near:   Left Eye Near:    Bilateral Near:     Physical Exam Vitals and nursing note reviewed.  Constitutional:      General: He is not in acute distress.    Appearance: He is well-developed. He is not ill-appearing.  HENT:     Head: Normocephalic and atraumatic.  Eyes:     Conjunctiva/sclera: Conjunctivae normal.  Cardiovascular:       Rate and Rhythm: Normal rate and regular rhythm.     Heart sounds: No murmur. No friction rub. No gallop.   Pulmonary:     Effort: Pulmonary effort is normal. No respiratory distress.     Breath sounds: Normal breath sounds.  Abdominal:     Palpations: Abdomen is soft.     Tenderness: There is no abdominal tenderness.  Musculoskeletal:     Cervical back: Neck supple.     Right lower leg: No edema.     Left lower leg: No edema.  Skin:    General: Skin is warm and dry.  Neurological:     Mental Status: He is alert.  Psychiatric:        Mood and Affect: Mood normal.        Behavior: Behavior normal.        Thought Content: Thought content normal.        Judgment: Judgment normal.      UC Treatments / Results  Labs (all labs ordered are listed, but only abnormal results are displayed) Labs Reviewed - No data to display  EKG   Radiology No results found.  Procedures Procedures (including critical care time)  Medications Ordered in UC Medications - No data to display  Initial Impression / Assessment and Plan / UC Course  I have reviewed the triage vital signs and the nursing notes.  Pertinent labs & imaging results that were available during my care of the patient were reviewed by me and considered in my medical decision making (see chart for details).     #Covid Exposure #History of COVID - Covid history was discussed after testing. However, will send PCR due to unclear guidelines on natural immunity from disease. He is asymptomatic. Precautions discussed.    Final Clinical Impressions(s) / UC Diagnoses   Final diagnoses:  None   Discharge Instructions   None    ED Prescriptions    None     PDMP not reviewed this encounter.   Purnell Shoemaker, PA-C 10/27/19 2059

## 2019-10-27 NOTE — ED Triage Notes (Signed)
Pt presents to UC for COVID test after exposure 3 days ago. Pt denies any signs and symptoms.

## 2019-10-31 LAB — NOVEL CORONAVIRUS, NAA (HOSP ORDER, SEND-OUT TO REF LAB; TAT 18-24 HRS): SARS-CoV-2, NAA: NOT DETECTED

## 2019-11-09 ENCOUNTER — Encounter: Payer: Self-pay | Admitting: Internal Medicine

## 2019-11-10 ENCOUNTER — Other Ambulatory Visit: Payer: Self-pay

## 2019-11-10 MED ORDER — GLUCOSE BLOOD VI STRP: ORAL_STRIP | 12 refills | Status: AC

## 2019-12-15 ENCOUNTER — Other Ambulatory Visit: Payer: Self-pay | Admitting: Orthopaedic Surgery

## 2019-12-15 DIAGNOSIS — M25511 Pain in right shoulder: Secondary | ICD-10-CM

## 2019-12-16 ENCOUNTER — Telehealth: Payer: Self-pay | Admitting: Internal Medicine

## 2019-12-16 NOTE — Telephone Encounter (Signed)
Tashia,    I received a clearance paper for his shoulder surgery which is 4/21st.  His appointment with me is not until 4/9th  See if he wants to come sooner so we can have another A1c ?   Thanks    Abby Nena Jordan, MD  [facility] Endocrinology  [facility] [address] [place], [place] [address] Phone: [phone] FAX: [phone]

## 2019-12-16 NOTE — Telephone Encounter (Signed)
Rescheduled for 12/25/2019 @ 3:20

## 2019-12-17 ENCOUNTER — Ambulatory Visit
Admission: RE | Admit: 2019-12-17 | Discharge: 2019-12-17 | Disposition: A | Discharge status: Home / Self Care | Payer: 59 | Source: Ambulatory Visit | Attending: Orthopaedic Surgery | Admitting: Orthopaedic Surgery

## 2019-12-17 DIAGNOSIS — M25511 Pain in right shoulder: Secondary | ICD-10-CM

## 2019-12-23 ENCOUNTER — Other Ambulatory Visit: Payer: Self-pay

## 2019-12-25 ENCOUNTER — Ambulatory Visit (INDEPENDENT_AMBULATORY_CARE_PROVIDER_SITE_OTHER): Payer: 59 | Admitting: Internal Medicine

## 2019-12-25 ENCOUNTER — Encounter: Payer: Self-pay | Admitting: Internal Medicine

## 2019-12-25 ENCOUNTER — Other Ambulatory Visit: Payer: Self-pay

## 2019-12-25 VITALS — BP 138/88 | HR 96 | Temp 98.2°F | Ht 73.0 in | Wt 270.0 lb

## 2019-12-25 DIAGNOSIS — Z794 Long term (current) use of insulin: Secondary | ICD-10-CM

## 2019-12-25 DIAGNOSIS — E1165 Type 2 diabetes mellitus with hyperglycemia: Secondary | ICD-10-CM

## 2019-12-25 LAB — POCT GLYCOSYLATED HEMOGLOBIN (HGB A1C): Hemoglobin A1C: 7.5 % — AB (ref 4.0–5.6)

## 2019-12-25 NOTE — Patient Instructions (Addendum)
-   Increase  Ozempic back to  0.5 mg weekly  - Jardiance 25 mg daily  - Metformin 1000 mg Twice daily  - Lantus 38 units daily    - HOLD Jardiance 2 days before surgery      - HOW TO TREAT LOW BLOOD SUGARS (Blood sugar LESS THAN 70 MG/DL)  Please follow the RULE OF 15 for the treatment of hypoglycemia treatment (when your (blood sugars are less than 70 mg/dL)    STEP 1: Take 15 grams of carbohydrates when your blood sugar is low, which includes:   3-4 GLUCOSE TABS  OR  3-4 OZ OF JUICE OR REGULAR SODA OR  ONE TUBE OF GLUCOSE GEL     STEP 2: RECHECK blood sugar in 15 MINUTES STEP 3: If your blood sugar is still low at the 15 minute recheck --> then, go back to STEP 1 and treat AGAIN with another 15 grams of carbohydrates.

## 2019-12-25 NOTE — Progress Notes (Signed)
Name: James Parrish  Age/ Sex: 54 y.o., male   MRN/ DOB: [id number], 08/23/1966     PCP: Sueanne Margarita, DO   Reason for Endocrinology Evaluation: Type 2 Diabetes Mellitus  Initial Endocrine Consultative Visit: 07/13/2019    PATIENT IDENTIFIER: [name] is a 54 y.o. male with a past medical history of T2Dm, HTN and Dyslipidemia. The patient has followed with Endocrinology clinic since 07/13/2019 for consultative assistance with management of his diabetes.  DIABETIC HISTORY:  [name] was diagnosed with DM prior to 2010, was initially on oral glycemic agents, insulin started in 2013. His hemoglobin A1c has ranged from 7.2% in 2018, peaking at 8.8% in 2019.  On his initial visit to our clinic, his A1c 7.7% . He was on lantus, victoza, metformin and Jardiance. We switched Victoza to Ozempic.    He is a truck driver SUBJECTIVE:   During the last visit (10/13/2019): A1c 7.2% . We continued Ozempic, Jardiance, Metformin and Lantus   Today (12/28/2019): [name] is here for a 3 month follow up on diabetes.  He checks his blood sugars 2 times . The patient has not had hypoglycemic episodes since the last clinic visit. Otherwise, the patient has not required any recent emergency interventions for hypoglycemia and has not had recent hospitalizations secondary to hyper or hypoglycemic episodes.    He is pending right  shoulder surgery. Last week he injured left shoulder   ROS: As per HPI and as detailed below: Review of Systems  Constitutional: Negative for chills and fever.  Respiratory: Negative for cough and shortness of breath.   Cardiovascular: Negative for chest pain and palpitations.  Gastrointestinal: Negative for diarrhea and nausea.      HOME DIABETES REGIMEN:  Ozempic 0.5 mg weekly ( Monday ) - taking 0.25 mg  Jardiance 25 mg daily  Metformin 1000 mg Twice daily  Lantus 36 units daily     DIABETIC COMPLICATIONS: Microvascular complications:   Denies:  CKD, retinopathy, neuropathy  Last eye exam: Completed 2020  Macrovascular complications:   Denies: CAD, PVD, CVA  METER DOWNLOAD SUMMARY: 2/13-3/09/2020 Fingerstick Blood Glucose Tests = 48 Average Number Tests/Day = 1.7 Overall Mean FS Glucose = 129 Standard Deviation = 25.3  BG Ranges: Low = 87 High = 203  BG Target % Results: % In target = 96 % Over target = 4 % Under target = 0  Hypoglycemic Events/30 Days: BG < 50 = 0 Episodes of symptomatic severe hypoglycemia = 0    HISTORY:  Past Medical History:  Past Medical History:  Diagnosis Date  . Diabetes mellitus without complication ([place])   . Hyperlipidemia   . Hypertension    Past Surgical History:  Past Surgical History:  Procedure Laterality Date  . ORIF PATELLA    . QUADRICEPS REPAIR     Social History:  reports that he has been smoking. He has never used smokeless tobacco. He reports current alcohol use. He reports that he does not use drugs. Family History:  Family History  Problem Relation Age of Onset  . Cancer Mother   . Hypertension Mother   . Diabetes Mother   . Venous thrombosis Son      HOME MEDICATIONS: Allergies as of 12/25/2019   No Known Allergies     Medication List       Accurate as of December 25, 2019 11:59 PM. If you have any questions, ask your nurse or doctor.        AMLODIPINE BENZOATE PO  Take by mouth.   ASPIRIN PO Take 81 mg by mouth daily.   CIALIS PO Take by mouth.   glucose blood test strip Use as instructed to test blood sugar 2 times daily E11.65    Onetouch strips   HYDROCHLOROTHIAZIDE PO Take by mouth.   JARDIANCE PO Take by mouth.   LANTUS [place] Inject 38 Units into the skin.   LIPITOR PO Take by mouth.   LOSARTAN POTASSIUM PO Take by mouth.   METFORMIN HCL PO Take 1,000 mg by mouth. 2 pills 2 times daily   METOPROLOL SUCCINATE PO Take by mouth.   Ozempic (0.25 or 0.5 MG/DOSE) 2 MG/1.5ML Sopn Generic drug: Semaglutide(0.25 or  0.5MG /DOS) Inject 0.5 mg into the skin once a week.   SPIRONOLACTONE PO Take by mouth.        OBJECTIVE:   Vital Signs: BP 138/88   Pulse 96   Temp 98.2 F (36.8 C) (Oral)   Ht 6\' 1"  (1.854 m)   Wt 270 lb (122.5 kg)   SpO2 96%   BMI 35.62 kg/m   Wt Readings from Last 3 Encounters:  12/25/19 270 lb (122.5 kg)  10/13/19 273 lb 9.6 oz (124.1 kg)  07/13/19 265 lb 12.8 oz (120.6 kg)     Exam: General: Pt appears well and is in NAD  Neck: General: Supple without adenopathy. Thyroid: Thyroid size normal.  No goiter or nodules appreciated. No thyroid bruit.  Lungs: Clear with good BS bilat with no rales, rhonchi, or wheezes  Heart: RRR with normal S1 and S2 and no gallops; no murmurs; no rub  Abdomen: Normoactive bowel sounds, soft, nontender, without masses or organomegaly palpable  Extremities: No pretibial edema. No tremor. Normal strength and motion throughout. See detailed diabetic foot exam below.  Skin: Normal texture and temperature to palpation. No rash noted. No Acanthosis nigricans/skin tags. No lipohypertrophy.  Neuro: MS is good with appropriate affect, pt is alert and Ox3    DM foot exam: 12/28/2019 The skin of the feet is intact without sores or ulcerations. The pedal pulses are 2+ on right and 2+ on left. The sensation is intact to a screening 5.07, 10 gram monofilament bilaterally    DATA REVIEWED:  Lab Results  Component Value Date   HGBA1C 7.5 (A) 12/25/2019   HGBA1C 7.2 (A) 10/13/2019   HGBA1C 7.7 (A) 07/13/2019   Lab Results  Component Value Date   CREATININE 1.10 07/13/2019    08/05/19  Microalb/cr ratio 5.5  LDL 44 mg/dL   ASSESSMENT / PLAN / RECOMMENDATIONS:   1) Type 2 Diabetes Mellitus, acceptable control, Without  complications - Most recent A1c of 7.5 %. Goal A1c < 7.0 %.    -Minimal elevation in his A1c from 7.2% to 7.5%, patient may proceed with shoulder surgery -Somehow he started taking 0.25 mg weekly of Ozempic instead of  the 0.5 mg weekly, he is not sure how that happened which is the reason probably for his minimal elevation in A1c, we will increase Ozempic back to 0.5 mg weekly. -He was advised to hold the Jardiance 48 hours prior to his surgery -I will also increase his Lantus dose as I have noted his morning BG's tend to be higher than bedtime.  MEDICATIONS: -Increase Ozempic back to 0.5 mg weekly  -Continue Jardiance 25 mg daily  -Continue Metformin 1000 mg Twice daily  -Increase Lantus to 38 units daily    EDUCATION / INSTRUCTIONS: BG monitoring instructions: Patient is instructed to check his  blood sugars 2 times a day, fasting and bedtime. Call [facility] clinic if: BG persistently < 70 or > 300. I reviewed the Rule of 15 for the treatment of hypoglycemia in detail with the patient. Literature supplied.      F/U in 3 months    Signed electronically by: Mack Guise, MD  [facility] Endocrinology  [facility] [address] [place], [place] [address] Phone: [phone] FAX: [phone]   CC: Sueanne Margarita, [place] [address] Phone: [phone]  Fax: [phone]  Return to Endocrinology clinic as below: Future Appointments  Date Time Provider [place]  01/25/2020 10:00 AM [facility] PAT 1 [facility] None  01/25/2020 11:30 AM [facility] PAT 6 [facility] None  05/06/2020  3:20 PM Kaliope Quinonez, Melanie Crazier, MD [facility] None

## 2019-12-28 ENCOUNTER — Encounter: Payer: Self-pay | Admitting: Internal Medicine

## 2020-01-16 ENCOUNTER — Ambulatory Visit: Payer: 59

## 2020-01-22 ENCOUNTER — Ambulatory Visit: Payer: 59 | Admitting: Internal Medicine

## 2020-01-22 ENCOUNTER — Encounter (HOSPITAL_COMMUNITY): Payer: Self-pay

## 2020-01-22 NOTE — Patient Instructions (Addendum)
DUE TO COVID-19 ONLY TWO VISITORS ARE ALLOWED TO COME WITH YOU AND STAY IN THE WAITING ROOM ONLY DURING PRE OP AND PROCEDURE. THE TWO VISITORS MAY VISIT WITH YOU IN YOUR PRIVATE ROOM DURING VISITING HOURS ONLY!!   COVID SWAB TESTING MUST BE COMPLETED ON:  Saturday, January 30, 2020 at 9:20 AM   [address], [place] -[facility] enter pre surgical testing line (Must self quarantine after testing. Follow instructions on handout.)             Your procedure is scheduled on: Wednesday, February 03, 2020   Report to [facility] Main  Entrance    Report to admitting at 6:30 AM   Call this number if you have problems the morning of surgery [phone]   Do not eat food:After Midnight.   May have liquids until 5:30 AM day of surgery   CLEAR LIQUID DIET  Foods Allowed                                                                     Foods Excluded  Water, Black Coffee and tea, regular and decaf                             liquids that you cannot  Plain Jell-O in any flavor  (No red)                                           see through such as: Fruit ices (not with fruit pulp)                                     milk, soups, orange juice  Iced Popsicles (No red)                                    All solid food Carbonated beverages, regular and diet                                    Apple juices Sports drinks like Gatorade (No red) Lightly seasoned clear broth or consume(fat free) Sugar, honey syrup  Sample Menu Breakfast                                Lunch                                     Supper Cranberry juice                    Beef broth                            Chicken broth Jell-O  Grape juice                           Apple juice Coffee or tea                        Jell-O                                      Popsicle                                                Coffee or tea                         Coffee or tea   Complete one G2 drink the morning of surgery at 5:30 AM the day of surgery.   Oral Hygiene is also important to reduce your risk of infection.                                    Remember - BRUSH YOUR TEETH THE MORNING OF SURGERY WITH YOUR REGULAR TOOTHPASTE   Do NOT smoke after Midnight   Take these medicines the morning of surgery with A SIP OF WATER: Metoprolol, Atorvastatin   Do NOT take Jardiance the day before surgery   Take 1/2 (half) Insulin Glargine the night before surgery (18 units)   Do NOT take Cialis 24 hours prior to surgery  DO NOT TAKE ANY ORAL DIABETIC MEDICATIONS DAY OF YOUR SURGERY                               You may not have any metal on your body including  jewelry, and body piercings             Do not wear  lotions, powders, perfumes/cologne, or deodorant                          Men may shave face and neck.   Do not bring valuables to the hospital. [place].   Contacts, dentures or bridgework may not be worn into surgery.    Patients discharged the day of surgery will not be allowed to drive home.   Special Instructions: Bring a copy of your healthcare power of attorney and living will documents         the day of surgery if you haven't scanned them in before.              Please read over the following fact sheets you were given: IF YOU HAVE QUESTIONS ABOUT YOUR PRE OP [place] [phone]  How to Manage Your Diabetes Before and After Surgery  Why is it important to control my blood sugar before and after surgery? . Improving blood sugar levels before and after surgery helps healing and can limit problems. . A way of improving blood sugar control is eating a healthy diet by: o  Eating less sugar and carbohydrates o  Increasing activity/exercise o  Talking with your doctor about reaching your blood sugar goals . High blood sugars (greater than 180 mg/dL) can raise  your risk of infections and slow your recovery, so you will need to focus on controlling your diabetes during the weeks before surgery. . Make sure that the doctor who takes care of your diabetes knows about your planned surgery including the date and location.  How do I manage my blood sugar before surgery? . Check your blood sugar at least 4 times a day, starting 2 days before surgery, to make sure that the level is not too high or low. o Check your blood sugar the morning of your surgery when you wake up and every 2 hours until you get to the Short Stay unit. . If your blood sugar is less than 70 mg/dL, you will need to treat for low blood sugar: o Do not take insulin. o Treat a low blood sugar (less than 70 mg/dL) with  cup of clear juice (cranberry or apple), 4 glucose tablets, OR glucose gel. o Recheck blood sugar in 15 minutes after treatment (to make sure it is greater than 70 mg/dL). If your blood sugar is not greater than 70 mg/dL on recheck, call [phone] for further instructions. . Report your blood sugar to the short stay nurse when you get to Short Stay.  . If you are admitted to the hospital after surgery: o Your blood sugar will be checked by the staff and you will probably be given insulin after surgery (instead of oral diabetes medicines) to make sure you have good blood sugar levels. o The goal for blood sugar control after surgery is 80-180 mg/dL.   WHAT DO I DO ABOUT MY DIABETES MEDICATION?  . Do NOT take Jardiance the day before surgery  . Do not take oral diabetes medicines (pills) the morning of surgery.  . THE NIGHT BEFORE SURGERY, take  18   units of   Glargine    insulin.      . The day of surgery, do not take other diabetes injectables, including Byetta (exenatide), Bydureon (exenatide ER), Victoza (liraglutide), or Trulicity (dulaglutide).  Reviewed and Endorsed by [facility] Patient Education Committee, August 2015   [facility]- Preparing for Total  Shoulder Arthroplasty    Before surgery, you can play an important role. Because skin is not sterile, your skin needs to be as free of germs as possible. You can reduce the number of germs on your skin by using the following products. . Benzoyl Peroxide Gel o Reduces the number of germs present on the skin o Applied twice a day to shoulder area starting two days before surgery    ==================================================================  Please follow these instructions carefully:  BENZOYL PEROXIDE 5% GEL  Please do not use if you have an allergy to benzoyl peroxide.   If your skin becomes reddened/irritated stop using the benzoyl peroxide.  Starting two days before surgery, apply as follows: 1. Apply benzoyl peroxide in the morning and at night. Apply after taking a shower. If you are not taking a shower clean entire shoulder front, back, and side along with the armpit with a clean wet washcloth.  2. Place a quarter-sized dollop on your shoulder and rub in thoroughly, making sure to cover the front, back, and side of your shoulder, along with the armpit.   2 days before ____ AM   ____ PM  1 day before ____ AM   ____ PM                         3. Do this twice a day for two days.  (Last application is the night before surgery, AFTER using the CHG soap as described below).  4. Do NOT apply benzoyl peroxide gel on the day of surgery.   [place] - Preparing for Surgery Before surgery, you can play an important role.  Because skin is not sterile, your skin needs to be as free of germs as possible.  You can reduce the number of germs on your skin by washing with CHG (chlorahexidine gluconate) soap before surgery.  CHG is an antiseptic cleaner which kills germs and bonds with the skin to continue killing germs even after washing. Please DO NOT use if you have an allergy to CHG or antibacterial soaps.  If your skin becomes reddened/irritated stop using the CHG and inform  your nurse when you arrive at Short Stay. Do not shave (including legs and underarms) for at least 48 hours prior to the first CHG shower.  You may shave your face/neck.  Please follow these instructions carefully:  1.  Shower with CHG Soap the night before surgery and the  morning of surgery.  2.  If you choose to wash your hair, wash your hair first as usual with your normal  shampoo.  3.  After you shampoo, rinse your hair and body thoroughly to remove the shampoo.                             4.  Use CHG as you would any other liquid soap.  You can apply chg directly to the skin and wash.  Gently with a scrungie or clean washcloth.  5.  Apply the CHG Soap to your body ONLY FROM THE NECK DOWN.   Do   not use on face/ open                           Wound or open sores. Avoid contact with eyes, ears mouth and   genitals (private parts).                       Wash face,  Genitals (private parts) with your normal soap.             6.  Wash thoroughly, paying special attention to the area where your    surgery  will be performed.  7.  Thoroughly rinse your body with warm water from the neck down.  8.  DO NOT shower/wash with your normal soap after using and rinsing off the CHG Soap.                9.  Pat yourself dry with a clean towel.            10.  Wear clean pajamas.            11.  Place clean sheets on your bed the night of your first shower and do not  sleep with pets. Day of Surgery : Do not apply any lotions/deodorants the morning of surgery.  Please wear clean clothes to the hospital/surgery center.  FAILURE TO FOLLOW THESE INSTRUCTIONS MAY RESULT IN THE CANCELLATION OF YOUR SURGERY  PATIENT SIGNATURE_________________________________  NURSE SIGNATURE__________________________________  ________________________________________________________________________   Adam Phenix  An incentive spirometer is a tool that can help keep your lungs clear and active. This tool  measures how well you are filling your lungs with each breath. Taking long deep breaths may help reverse or decrease the chance of developing breathing (pulmonary) problems (especially infection) following:  A long period of time when you are unable to move or be active. BEFORE THE PROCEDURE   If the spirometer includes an indicator to show your best effort, your nurse or respiratory therapist will set it to a desired goal.  If possible, sit up straight or lean slightly forward. Try not to slouch.  Hold the incentive spirometer in an upright position. INSTRUCTIONS FOR USE  1. Sit on the edge of your bed if possible, or sit up as far as you can in bed or on a chair. 2. Hold the incentive spirometer in an upright position. 3. Breathe out normally. 4. Place the mouthpiece in your mouth and seal your lips tightly around it. 5. Breathe in slowly and as deeply as possible, raising the piston or the ball toward the top of the column. 6. Hold your breath for 3-5 seconds or for as long as possible. Allow the piston or ball to fall to the bottom of the column. 7. Remove the mouthpiece from your mouth and breathe out normally. 8. Rest for a few seconds and repeat Steps 1 through 7 at least 10 times every 1-2 hours when you are awake. Take your time and take a few normal breaths between deep breaths. 9. The spirometer may include an indicator to show your best effort. Use the indicator as a goal to work toward during each repetition. 10. After each set of 10 deep breaths, practice coughing to be sure your lungs are clear. If you have an incision (the cut made at the time of surgery), support your incision when coughing by placing a pillow or rolled up towels firmly against it. Once you are able to get out of bed, walk around indoors and cough well. You may stop using the incentive spirometer when instructed by your caregiver.  RISKS AND COMPLICATIONS  Take your time so you do not get dizzy or  light-headed.  If you are in pain, you may need to take or ask for pain medication before doing incentive spirometry. It is harder to take a deep breath if you are having pain. AFTER USE  Rest and breathe slowly and easily.  It can be helpful to keep track of a log of your progress. Your caregiver can provide you with a simple table to help with this. If you are using the spirometer at home, follow these instructions: [place] IF:   You are having difficultly using the spirometer.  You have trouble using the spirometer as often as instructed.  Your pain medication is not giving enough relief while using the spirometer.  You develop fever of 100.5 F (38.1 C) or higher. SEEK IMMEDIATE MEDICAL CARE IF:   You cough up bloody sputum that had not been present before.  You develop fever of 102 F (38.9 C) or greater.  You develop worsening pain at or near the incision site. MAKE SURE YOU:   Understand these instructions.  Will watch your condition.  Will get help right away if you are not doing well or get worse. Document Released: 02/11/2007 Document Revised: 12/24/2011 Document Reviewed: 04/14/2007 Exit[employer] Patient Information 2014 [employer], [place].   ________________________________________________________________________  WHAT IS A  BLOOD TRANSFUSION? Blood Transfusion Information  A transfusion is the replacement of blood or some of its parts. Blood is made up of multiple cells which provide different functions.  Red blood cells carry oxygen and are used for blood loss replacement.  White blood cells fight against infection.  Platelets control bleeding.  Plasma helps clot blood.  Other blood products are available for specialized needs, such as hemophilia or other clotting disorders. BEFORE THE TRANSFUSION  Who gives blood for transfusions?   Healthy volunteers who are fully evaluated to make sure their blood is safe. This is blood bank  blood. Transfusion therapy is the safest it has ever been in the practice of medicine. Before blood is taken from a donor, a complete history is taken to make sure that person has no history of diseases nor engages in risky social behavior (examples are intravenous drug use or sexual activity with multiple partners). The donor's travel history is screened to minimize risk of transmitting infections, such as malaria. The donated blood is tested for signs of infectious diseases, such as HIV and hepatitis. The blood is then tested to be sure it is compatible with you in order to minimize the chance of a transfusion reaction. If you or a relative donates blood, this is often done in anticipation of surgery and is not appropriate for emergency situations. It takes many days to process the donated blood. RISKS AND COMPLICATIONS Although transfusion therapy is very safe and saves many lives, the main dangers of transfusion include:   Getting an infectious disease.  Developing a transfusion reaction. This is an allergic reaction to something in the blood you were given. Every precaution is taken to prevent this. The decision to have a blood transfusion has been considered carefully by your caregiver before blood is given. Blood is not given unless the benefits outweigh the risks. AFTER THE TRANSFUSION  Right after receiving a blood transfusion, you will usually feel much better and more energetic. This is especially true if your red blood cells have gotten low (anemic). The transfusion raises the level of the red blood cells which carry oxygen, and this usually causes an energy increase.  The nurse administering the transfusion will monitor you carefully for complications. HOME CARE INSTRUCTIONS  No special instructions are needed after a transfusion. You may find your energy is better. Speak with your caregiver about any limitations on activity for underlying diseases you may have. SEEK MEDICAL CARE IF:    Your condition is not improving after your transfusion.  You develop redness or irritation at the intravenous (IV) site. SEEK IMMEDIATE MEDICAL CARE IF:  Any of the following symptoms occur over the next 12 hours:  Shaking chills.  You have a temperature by mouth above 102 F (38.9 C), not controlled by medicine.  Chest, back, or muscle pain.  People around you feel you are not acting correctly or are confused.  Shortness of breath or difficulty breathing.  Dizziness and fainting.  You get a rash or develop hives.  You have a decrease in urine output.  Your urine turns a dark color or changes to pink, red, or brown. Any of the following symptoms occur over the next 10 days:  You have a temperature by mouth above 102 F (38.9 C), not controlled by medicine.  Shortness of breath.  Weakness after normal activity.  The white part of the eye turns yellow (jaundice).  You have a decrease in the amount of urine or are urinating less often.  Your urine turns a dark color or changes to pink, red, or brown. Document Released: 09/28/2000 Document Revised: 12/24/2011 Document Reviewed: 05/17/2008 [facility] Patient Information 2014 [place], [place].  _______________________________________________________________________

## 2020-01-25 ENCOUNTER — Encounter (HOSPITAL_COMMUNITY)
Admission: RE | Admit: 2020-01-25 | Discharge: 2020-01-25 | Disposition: A | Discharge status: Home / Self Care | Payer: 59 | Source: Ambulatory Visit | Attending: Orthopaedic Surgery | Admitting: Orthopaedic Surgery

## 2020-01-25 ENCOUNTER — Encounter (HOSPITAL_COMMUNITY): Payer: Self-pay

## 2020-01-25 ENCOUNTER — Encounter (HOSPITAL_COMMUNITY): Payer: Self-pay | Admitting: Physician Assistant

## 2020-01-25 ENCOUNTER — Other Ambulatory Visit: Payer: Self-pay

## 2020-01-25 DIAGNOSIS — Z01812 Encounter for preprocedural laboratory examination: Secondary | ICD-10-CM | POA: Diagnosis present

## 2020-01-25 DIAGNOSIS — Z01810 Encounter for preprocedural cardiovascular examination: Secondary | ICD-10-CM | POA: Diagnosis present

## 2020-01-25 DIAGNOSIS — R9431 Abnormal electrocardiogram [ECG] [EKG]: Secondary | ICD-10-CM | POA: Insufficient documentation

## 2020-01-25 HISTORY — DX: Other complications of anesthesia, initial encounter: T88.59XA

## 2020-01-25 HISTORY — DX: Male erectile dysfunction, unspecified: N52.9

## 2020-01-25 LAB — SURGICAL PCR SCREEN
MRSA, PCR: NEGATIVE
Staphylococcus aureus: NEGATIVE

## 2020-01-25 LAB — BASIC METABOLIC PANEL
Anion gap: 10 (ref 5–15)
BUN: 19 mg/dL (ref 6–20)
CO2: 24 mmol/L (ref 22–32)
Calcium: 9.1 mg/dL (ref 8.9–10.3)
Chloride: 104 mmol/L (ref 98–111)
Creatinine, Ser: 0.87 mg/dL (ref 0.61–1.24)
GFR calc Af Amer: >60 mL/min (ref >60)
GFR calc non Af Amer: >60 mL/min (ref >60)
Glucose, Bld: 137 mg/dL — ABNORMAL HIGH (ref 70–99)
Potassium: 4.4 mmol/L (ref 3.5–5.1)
Sodium: 138 mmol/L (ref 135–145)

## 2020-01-25 LAB — CBC
HCT: 49.5 % (ref 39.0–52.0)
Hemoglobin: 15.6 g/dL (ref 13.0–17.0)
MCH: 24.9 pg — ABNORMAL LOW (ref 26.0–34.0)
MCHC: 31.5 g/dL (ref 30.0–36.0)
MCV: 79.1 fL — ABNORMAL LOW (ref 80.0–100.0)
Platelets: 241 10*3/uL (ref 150–400)
RBC: 6.26 MIL/uL — ABNORMAL HIGH (ref 4.22–5.81)
RDW: 16 % — ABNORMAL HIGH (ref 11.5–15.5)
WBC: 9.6 10*3/uL (ref 4.0–10.5)
nRBC: 0 % (ref 0.0–0.2)

## 2020-01-25 LAB — GLUCOSE, CAPILLARY: Glucose-Capillary: 138 mg/dL — ABNORMAL HIGH (ref 70–99)

## 2020-01-25 NOTE — Progress Notes (Signed)
PCP - Dr. Dannielle Huh last office visit 2/21 Cardiologist - N/A  Chest x-ray - N/A EKG - 01/25/20 in epic Stress Test - greater than 2 years ECHO - greater than 2 years Cardiac Cath -  greater than 2 years  Sleep Study - greater than 2 years CPAP - N/A  Fasting Blood Sugar - 112-130's Checks Blood Sugar __2___ times a day HgbA1c 7.5 on 12/25/19 in epic  Blood Thinner Instructions:N/A Aspirin Instructions: Yes Last Dose: 02/01/20  Anesthesia review: Abnormal EKG 01/25/20, DM, HTN  Patient denies shortness of breath, fever, cough and chest pain at PAT appointment   Patient verbalized understanding of instructions that were given to them at the PAT appointment. Patient was also instructed that they will need to review over the PAT instructions again at home before surgery.

## 2020-01-25 NOTE — Progress Notes (Signed)
PCP - Dr. Dannielle Huh last office visit 2/21 Cardiologist - N/A  Chest x-ray - N/A EKG - 01/25/20 in epic Stress Test - greater than 2 years ECHO - greater than 2 years Cardiac Cath -  greater than 2 years  Sleep Study - greater than 2 years CPAP - N/A  Fasting Blood Sugar - 112-130's Checks Blood Sugar __2___ times a day HgbA1c 7.5 on 12/25/19 in epic  Blood Thinner Instructions:N/A Aspirin Instructions: Yes Last Dose: 02/01/20  Anesthesia review: DM, HTN  Patient denies shortness of breath, fever, cough and chest pain at PAT appointment   Patient verbalized understanding of instructions that were given to them at the PAT appointment. Patient was also instructed that they will need to review over the PAT instructions again at home before surgery.

## 2020-01-27 ENCOUNTER — Other Ambulatory Visit: Payer: Self-pay

## 2020-01-27 ENCOUNTER — Encounter (HOSPITAL_BASED_OUTPATIENT_CLINIC_OR_DEPARTMENT_OTHER): Payer: Self-pay | Admitting: Orthopaedic Surgery

## 2020-01-30 ENCOUNTER — Other Ambulatory Visit (HOSPITAL_COMMUNITY): Payer: 59

## 2020-02-01 ENCOUNTER — Other Ambulatory Visit (HOSPITAL_COMMUNITY)
Admission: RE | Admit: 2020-02-01 | Discharge: 2020-02-01 | Disposition: A | Discharge status: Home / Self Care | Payer: 59 | Source: Ambulatory Visit | Attending: Orthopaedic Surgery | Admitting: Orthopaedic Surgery

## 2020-02-01 DIAGNOSIS — Z01812 Encounter for preprocedural laboratory examination: Secondary | ICD-10-CM | POA: Diagnosis present

## 2020-02-01 DIAGNOSIS — Z20822 Contact with and (suspected) exposure to covid-19: Secondary | ICD-10-CM | POA: Diagnosis not present

## 2020-02-01 LAB — SARS CORONAVIRUS 2 (TAT 6-24 HRS): SARS Coronavirus 2: NEGATIVE

## 2020-02-03 ENCOUNTER — Ambulatory Visit (HOSPITAL_COMMUNITY): Admission: RE | Admit: 2020-02-03 | Payer: 59 | Source: Ambulatory Visit | Admitting: Orthopaedic Surgery

## 2020-02-03 ENCOUNTER — Encounter (HOSPITAL_COMMUNITY): Admission: RE | Payer: Self-pay | Source: Ambulatory Visit

## 2020-02-03 SURGERY — ARTHROPLASTY, SHOULDER, TOTAL, REVERSE
Anesthesia: Choice | Site: Shoulder | Laterality: Right

## 2020-02-03 NOTE — H&P (Signed)
PREOPERATIVE H&P  Chief Complaint: LEFT SHOULDER CARTLAGE DISORDER, IMPINGEMENT SYNDROME  ROTATOR CUFF TEAR. BICEP TENDONITIS  HPI: James Parrish is a 54 y.o. male who is scheduled for SHOULDER ARTHROSCOPY DEBRIDEMENT  WITH SUBACROMIAL DECOMPRESSION AND DISTAL CLAVICLE EXCISION SHOULDER ARTHROSCOPY WITH ROTATOR CUFF REPAIR WITH BICEP TENODESIS.   Patient has a past medical history significant for HTN, hyperlipidemia, and Diabetes mellitus.   Patient had a fall at work onto his left shoulder in the beginning of March. He had immediate pain and inability to raise his arm overhead following this.   His symptoms are rated as moderate to severe, and have been worsening.  This is significantly impairing activities of daily living.    Please see clinic note for further details on this patient's care.    He has elected for surgical management.   Past Medical History:  Diagnosis Date  . Complication of anesthesia    hard to wake up   . Diabetes mellitus without complication ([place])   . ED (erectile dysfunction)   . History of COVID-19 04/2019  . Hyperlipidemia   . Hypertension    Past Surgical History:  Procedure Laterality Date  . COLONOSCOPY    . finger surgery Right    Index  . KNEE SURGERY Left   . KNEE SURGERY Right   . ORIF PATELLA Right    Social History   Socioeconomic History  . Marital status: Married    Spouse name: Not on file  . Number of children: Not on file  . Years of education: Not on file  . Highest education level: Not on file  Occupational History  . Not on file  Tobacco Use  . Smoking status: Former Smoker    Packs/day: 1.00    Years: 30.00    Pack years: 30.00    Types: Cigarettes    Quit date: 12/25/2019    Years since quitting: 0.1  . Smokeless tobacco: Never Used  Substance and Sexual Activity  . Alcohol use: Not Currently    Comment: occasionally  . Drug use: Never  . Sexual activity: Not on file  Other Topics Concern  . Not on file   Social History Narrative  . Not on file   Social Determinants of Health   Financial Resource Strain:   . Difficulty of Paying Living Expenses:   Food Insecurity:   . Worried About Charity fundraiser in the Last Year:   . Arboriculturist in the Last Year:   Transportation Needs:   . Film/video editor (Medical):   [name] Kitchen Lack of Transportation (Non-Medical):   Physical Activity:   . Days of Exercise per Week:   . Minutes of Exercise per Session:   Stress:   . Feeling of Stress :   Social Connections:   . Frequency of Communication with Friends and Family:   . Frequency of Social Gatherings with Friends and Family:   . Attends Religious Services:   . Active Member of Clubs or Organizations:   . Attends Archivist Meetings:   [name] Kitchen Marital Status:    Family History  Problem Relation Age of Onset  . Cancer Mother   . Hypertension Mother   . Diabetes Mother   . Venous thrombosis Son    No Known Allergies Prior to Admission medications   Medication Sig Start Date End Date Taking? Authorizing Provider  amLODipine (NORVASC) 10 MG tablet Take 10 mg by mouth at bedtime. 12/15/19  Yes [provider]  aspirin 81 MG EC tablet Take 81 mg by mouth daily. 12/15/19  Yes [provider]  atorvastatin (LIPITOR) 10 MG tablet Take 10 mg by mouth daily.    Yes [provider]  CINNAMON PO Take 1,000 mg by mouth daily.   Yes [provider]  empagliflozin (JARDIANCE) 10 MG TABS tablet Take 10 mg by mouth daily.    Yes [provider]  hydrochlorothiazide (HYDRODIURIL) 25 MG tablet Take 25 mg by mouth daily.    Yes [provider]  Insulin Glargine (BASAGLAR KWIKPEN) 100 UNIT/ML Inject 38 Units into the skin at bedtime.   Yes [provider]  losartan (COZAAR) 100 MG tablet Take 100 mg by mouth daily.    Yes [provider]  metFORMIN (GLUCOPHAGE) 1000 MG tablet Take 1,000 mg by mouth in the morning and at bedtime.     Yes [provider]  metoprolol tartrate (LOPRESSOR) 100 MG tablet Take 100 mg by mouth in the morning and at bedtime. 12/15/19  Yes [provider]  Multiple Vitamin (MULTIVITAMIN WITH MINERALS) TABS tablet Take 1 tablet by mouth daily.   Yes [provider]  Semaglutide,0.25 or 0.5MG /DOS, (OZEMPIC, 0.25 OR 0.5 MG/DOSE,) 2 MG/1.5ML SOPN Inject 0.5 mg into the skin once a week. 07/13/19  Yes Shamleffer, Melanie Crazier, MD  spironolactone (ALDACTONE) 50 MG tablet Take 50 mg by mouth daily.    Yes [provider]  tadalafil (CIALIS) 20 MG tablet Take 20 mg by mouth daily as needed (ED).    Yes [provider]  Aspirin Buf,CaCarb-MgCarb-MgO, 81 MG TABS Take 81 mg by mouth daily.    [provider]  glucose blood test strip Use as instructed to test blood sugar 2 times daily E11.65    Onetouch strips 11/10/19   Shamleffer, Melanie Crazier, MD    ROS: All other systems have been reviewed and were otherwise negative with the exception of those mentioned in the HPI and as above.  Physical Exam: General: Alert, no acute distress Cardiovascular: No pedal edema Respiratory: No cyanosis, no use of accessory musculature GI: No organomegaly, abdomen is soft and non-tender Skin: No lesions in the area of chief complaint Neurologic: Sensation intact distally Psychiatric: Patient is competent for consent with normal mood and affect Lymphatic: No axillary or cervical lymphadenopathy  MUSCULOSKELETAL:  Left shoulder: extraordinary pain with supraspinatus testing.  Positive drop arm test.  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 He has some fatty atrophy but there is still some muscle bulk noted.  Assessment: LEFT SHOULDER CARTLAGE DISORDER, IMPINGEMENT SYNDROME  ROTATOR CUFF TEAR. BICEP  TENDONITIS  Plan: Plan for Procedure(s): SHOULDER ARTHROSCOPY DEBRIDEMENT  WITH SUBACROMIAL DECOMPRESSION AND DISTAL CLAVICLE EXCISION SHOULDER ARTHROSCOPY WITH ROTATOR CUFF REPAIR WITH BICEP TENODESIS  The risks benefits and alternatives were discussed with the patient including but not limited to the risks of nonoperative treatment, versus surgical intervention including infection, bleeding, nerve injury,  blood clots, cardiopulmonary complications, high risk for re-tear, morbidity, mortality, among others, and they were willing to proceed.   The patient acknowledged the explanation, agreed to proceed with the plan and consent was signed.   Operative Plan: Left shoulder scope with subacromial decompression, distal clavicle excision, biceps tenodesis versus tenotomy and rotator cuff repair versus superior capsular reconstruction Discharge Medications: Standard DVT Prophylaxis: None Physical Therapy: Outpatient PT Special Discharge  needs: Sling   Ethelda Chick, PA-C  02/03/2020 3:40 PM

## 2020-02-04 ENCOUNTER — Encounter (HOSPITAL_BASED_OUTPATIENT_CLINIC_OR_DEPARTMENT_OTHER): Payer: Self-pay | Admitting: Orthopaedic Surgery

## 2020-02-04 ENCOUNTER — Ambulatory Visit (HOSPITAL_BASED_OUTPATIENT_CLINIC_OR_DEPARTMENT_OTHER)
Admission: RE | Admit: 2020-02-04 | Discharge: 2020-02-04 | Disposition: A | Discharge status: Home / Self Care | Payer: 59 | Attending: Orthopaedic Surgery | Admitting: Orthopaedic Surgery

## 2020-02-04 ENCOUNTER — Encounter (HOSPITAL_BASED_OUTPATIENT_CLINIC_OR_DEPARTMENT_OTHER)
Admission: RE | Disposition: A | Discharge status: Home / Self Care | Payer: Self-pay | Source: Home / Self Care | Attending: Orthopaedic Surgery

## 2020-02-04 ENCOUNTER — Ambulatory Visit (HOSPITAL_BASED_OUTPATIENT_CLINIC_OR_DEPARTMENT_OTHER): Payer: 59 | Admitting: Physician Assistant

## 2020-02-04 ENCOUNTER — Other Ambulatory Visit: Payer: Self-pay

## 2020-02-04 DIAGNOSIS — Z87891 Personal history of nicotine dependence: Secondary | ICD-10-CM | POA: Diagnosis not present

## 2020-02-04 DIAGNOSIS — Z794 Long term (current) use of insulin: Secondary | ICD-10-CM | POA: Insufficient documentation

## 2020-02-04 DIAGNOSIS — N529 Male erectile dysfunction, unspecified: Secondary | ICD-10-CM | POA: Insufficient documentation

## 2020-02-04 DIAGNOSIS — M7542 Impingement syndrome of left shoulder: Secondary | ICD-10-CM | POA: Insufficient documentation

## 2020-02-04 DIAGNOSIS — E119 Type 2 diabetes mellitus without complications: Secondary | ICD-10-CM | POA: Diagnosis not present

## 2020-02-04 DIAGNOSIS — M19012 Primary osteoarthritis, left shoulder: Secondary | ICD-10-CM | POA: Diagnosis not present

## 2020-02-04 DIAGNOSIS — E785 Hyperlipidemia, unspecified: Secondary | ICD-10-CM | POA: Diagnosis not present

## 2020-02-04 DIAGNOSIS — Z8616 Personal history of COVID-19: Secondary | ICD-10-CM | POA: Insufficient documentation

## 2020-02-04 DIAGNOSIS — Z833 Family history of diabetes mellitus: Secondary | ICD-10-CM | POA: Insufficient documentation

## 2020-02-04 DIAGNOSIS — S43432A Superior glenoid labrum lesion of left shoulder, initial encounter: Secondary | ICD-10-CM | POA: Insufficient documentation

## 2020-02-04 DIAGNOSIS — Z8249 Family history of ischemic heart disease and other diseases of the circulatory system: Secondary | ICD-10-CM | POA: Insufficient documentation

## 2020-02-04 DIAGNOSIS — Y990 Civilian activity done for income or pay: Secondary | ICD-10-CM | POA: Insufficient documentation

## 2020-02-04 DIAGNOSIS — Z7982 Long term (current) use of aspirin: Secondary | ICD-10-CM | POA: Diagnosis not present

## 2020-02-04 DIAGNOSIS — W19XXXA Unspecified fall, initial encounter: Secondary | ICD-10-CM | POA: Diagnosis not present

## 2020-02-04 DIAGNOSIS — I10 Essential (primary) hypertension: Secondary | ICD-10-CM | POA: Diagnosis not present

## 2020-02-04 DIAGNOSIS — S46012A Strain of muscle(s) and tendon(s) of the rotator cuff of left shoulder, initial encounter: Secondary | ICD-10-CM | POA: Diagnosis present

## 2020-02-04 DIAGNOSIS — Z79899 Other long term (current) drug therapy: Secondary | ICD-10-CM | POA: Insufficient documentation

## 2020-02-04 HISTORY — PX: SHOULDER ARTHROSCOPY WITH ROTATOR CUFF REPAIR: SHX5685

## 2020-02-04 LAB — GLUCOSE, CAPILLARY
Glucose-Capillary: 144 mg/dL — ABNORMAL HIGH (ref 70–99)
Glucose-Capillary: 159 mg/dL — ABNORMAL HIGH (ref 70–99)

## 2020-02-04 SURGERY — SHOULDER ARTHROSCOPY WITH SUBACROMIAL DECOMPRESSION AND DISTAL CLAVICLE EXCISION
Anesthesia: General | Site: Shoulder | Laterality: Left

## 2020-02-04 MED ORDER — ROCURONIUM BROMIDE 100 MG/10ML IV SOLN
INTRAVENOUS | Status: DC | PRN
  Administered 2020-02-04: 20 mg via INTRAVENOUS
  Administered 2020-02-04: 80 mg via INTRAVENOUS

## 2020-02-04 MED ORDER — KETOROLAC TROMETHAMINE 30 MG/ML IJ SOLN: 30.0000 mg | Freq: Once | INTRAMUSCULAR | Status: DC | PRN

## 2020-02-04 MED ORDER — SUGAMMADEX SODIUM 200 MG/2ML IV SOLN
INTRAVENOUS | Status: DC | PRN
  Administered 2020-02-04: 200 mg via INTRAVENOUS

## 2020-02-04 MED ORDER — DEXTROSE 5 % IV SOLN
INTRAVENOUS | Status: DC | PRN
  Administered 2020-02-04: 3 g via INTRAVENOUS

## 2020-02-04 MED ORDER — DEXAMETHASONE SODIUM PHOSPHATE 10 MG/ML IJ SOLN
INTRAMUSCULAR | Status: DC | PRN
  Administered 2020-02-04: 5 mg via INTRAVENOUS

## 2020-02-04 MED ORDER — MIDAZOLAM HCL 2 MG/2ML IJ SOLN
INTRAMUSCULAR | Status: AC
  Filled 2020-02-04: qty 2

## 2020-02-04 MED ORDER — LIDOCAINE 2% (20 MG/ML) 5 ML SYRINGE
INTRAMUSCULAR | Status: DC | PRN
  Administered 2020-02-04: 100 mg via INTRAVENOUS

## 2020-02-04 MED ORDER — CEFAZOLIN SODIUM 1 G IJ SOLR
INTRAMUSCULAR | Status: AC
  Filled 2020-02-04: qty 10

## 2020-02-04 MED ORDER — BUPIVACAINE LIPOSOME 1.3 % IJ SUSP
INTRAMUSCULAR | Status: DC | PRN
  Administered 2020-02-04: 10 mL via PERINEURAL

## 2020-02-04 MED ORDER — CEFAZOLIN SODIUM-DEXTROSE 2-4 GM/100ML-% IV SOLN
INTRAVENOUS | Status: AC
  Filled 2020-02-04: qty 100

## 2020-02-04 MED ORDER — ONDANSETRON HCL 4 MG/2ML IJ SOLN
INTRAMUSCULAR | Status: DC | PRN
  Administered 2020-02-04: 4 mg via INTRAVENOUS

## 2020-02-04 MED ORDER — ACETAMINOPHEN 500 MG PO TABS: 1000.0000 mg | ORAL_TABLET | Freq: Three times a day (TID) | ORAL | 0 refills | Status: AC

## 2020-02-04 MED ORDER — FENTANYL CITRATE (PF) 100 MCG/2ML IJ SOLN
50.0000 ug | INTRAMUSCULAR | Status: DC | PRN
  Administered 2020-02-04: 100 ug via INTRAVENOUS

## 2020-02-04 MED ORDER — MIDAZOLAM HCL 2 MG/2ML IJ SOLN
1.0000 mg | INTRAMUSCULAR | Status: DC | PRN
  Administered 2020-02-04: 2 mg via INTRAVENOUS

## 2020-02-04 MED ORDER — PROPOFOL 500 MG/50ML IV EMUL
INTRAVENOUS | Status: AC
  Filled 2020-02-04: qty 50

## 2020-02-04 MED ORDER — PHENYLEPHRINE 40 MCG/ML (10ML) SYRINGE FOR IV PUSH (FOR BLOOD PRESSURE SUPPORT)
PREFILLED_SYRINGE | INTRAVENOUS | Status: DC | PRN
  Administered 2020-02-04 (×2): 80 ug via INTRAVENOUS

## 2020-02-04 MED ORDER — ONDANSETRON HCL 4 MG/2ML IJ SOLN
INTRAMUSCULAR | Status: AC
  Filled 2020-02-04: qty 2

## 2020-02-04 MED ORDER — ONDANSETRON HCL 4 MG PO TABS: 4.0000 mg | ORAL_TABLET | Freq: Three times a day (TID) | ORAL | 1 refills | Status: AC | PRN

## 2020-02-04 MED ORDER — ROCURONIUM BROMIDE 10 MG/ML (PF) SYRINGE
PREFILLED_SYRINGE | INTRAVENOUS | Status: AC
  Filled 2020-02-04: qty 10

## 2020-02-04 MED ORDER — PROMETHAZINE HCL 25 MG/ML IJ SOLN: 6.2500 mg | INTRAMUSCULAR | Status: DC | PRN

## 2020-02-04 MED ORDER — FENTANYL CITRATE (PF) 100 MCG/2ML IJ SOLN
INTRAMUSCULAR | Status: AC
  Filled 2020-02-04: qty 2

## 2020-02-04 MED ORDER — MELOXICAM 7.5 MG PO TABS: 7.5000 mg | ORAL_TABLET | Freq: Every day | ORAL | 2 refills | Status: DC

## 2020-02-04 MED ORDER — LACTATED RINGERS IV SOLN: INTRAVENOUS | Status: DC

## 2020-02-04 MED ORDER — PHENYLEPHRINE HCL-NACL 10-0.9 MG/250ML-% IV SOLN
INTRAVENOUS | Status: DC | PRN
  Administered 2020-02-04: 40 ug/min via INTRAVENOUS

## 2020-02-04 MED ORDER — LIDOCAINE 2% (20 MG/ML) 5 ML SYRINGE
INTRAMUSCULAR | Status: AC
  Filled 2020-02-04: qty 5

## 2020-02-04 MED ORDER — FENTANYL CITRATE (PF) 100 MCG/2ML IJ SOLN
INTRAMUSCULAR | Status: DC | PRN
  Administered 2020-02-04 (×2): 50 ug via INTRAVENOUS

## 2020-02-04 MED ORDER — BUPIVACAINE HCL (PF) 0.5 % IJ SOLN
INTRAMUSCULAR | Status: DC | PRN
  Administered 2020-02-04: 15 mL via PERINEURAL

## 2020-02-04 MED ORDER — DEXAMETHASONE SODIUM PHOSPHATE 10 MG/ML IJ SOLN
INTRAMUSCULAR | Status: AC
  Filled 2020-02-04: qty 1

## 2020-02-04 MED ORDER — EPINEPHRINE PF 1 MG/ML IJ SOLN
INTRAMUSCULAR | Status: AC
  Filled 2020-02-04: qty 1

## 2020-02-04 MED ORDER — CEFAZOLIN SODIUM-DEXTROSE 2-4 GM/100ML-% IV SOLN: 2.0000 g | INTRAVENOUS | Status: DC

## 2020-02-04 MED ORDER — CEFAZOLIN SODIUM-DEXTROSE 1-4 GM/50ML-% IV SOLN
INTRAVENOUS | Status: AC
  Filled 2020-02-04: qty 50

## 2020-02-04 MED ORDER — PROPOFOL 10 MG/ML IV BOLUS
INTRAVENOUS | Status: DC | PRN
  Administered 2020-02-04: 100 mg via INTRAVENOUS
  Administered 2020-02-04: 200 mg via INTRAVENOUS

## 2020-02-04 MED ORDER — FENTANYL CITRATE (PF) 100 MCG/2ML IJ SOLN: 25.0000 ug | INTRAMUSCULAR | Status: DC | PRN

## 2020-02-04 MED ORDER — OXYCODONE HCL 5 MG PO TABS: ORAL_TABLET | ORAL | 0 refills | Status: AC

## 2020-02-04 SURGICAL SUPPLY — 74 items
ANCHOR SUT 1.8 FBRTK KNTLS 2SU (Anchor) ×15 IMPLANT
ANCHOR SUT BIO SW 4.75X19.1 (Anchor) ×3 IMPLANT
BLADE EXCALIBUR 4.0MM X 13CM (MISCELLANEOUS) ×1
BLADE EXCALIBUR 4.0X13 (MISCELLANEOUS) ×2 IMPLANT
BLADE SURG 10 STRL SS (BLADE) IMPLANT
BURR OVAL 8 FLU 4.0MM X 13CM (MISCELLANEOUS) ×1
BURR OVAL 8 FLU 4.0X13 (MISCELLANEOUS) ×2 IMPLANT
CANNULA 5.75X71 LONG (CANNULA) IMPLANT
CANNULA PASSPORT 5 (CANNULA) ×2 IMPLANT
CANNULA PASSPORT 5CM (CANNULA) ×1
CANNULA PASSPORT BUTTON (MISCELLANEOUS) ×1 IMPLANT
CANNULA PASSPORT BUTTON 10-40 (CANNULA) IMPLANT
CANNULA TWIST IN 8.25X7CM (CANNULA) IMPLANT
CHLORAPREP W/TINT 26 (MISCELLANEOUS) ×3 IMPLANT
CLOSURE STERI-STRIP 1/2X4 (GAUZE/BANDAGES/DRESSINGS) ×1
CLSR STERI-STRIP ANTIMIC 1/2X4 (GAUZE/BANDAGES/DRESSINGS) ×2 IMPLANT
COVER WAND RF STERILE (DRAPES) IMPLANT
DECANTER SPIKE VIAL GLASS SM (MISCELLANEOUS) IMPLANT
DISSECTOR 3.5MM X 13CM CVD (MISCELLANEOUS) IMPLANT
DISSECTOR 4.0MMX13CM CVD (MISCELLANEOUS) IMPLANT
DIVIDER PASSPORT (MISCELLANEOUS) ×3 IMPLANT
DRAPE IMP U-DRAPE 54X76 (DRAPES) ×3 IMPLANT
DRAPE INCISE IOBAN 66X45 STRL (DRAPES) IMPLANT
DRAPE SHOULDER BEACH CHAIR (DRAPES) ×3 IMPLANT
DRSG PAD ABDOMINAL 8X10 ST (GAUZE/BANDAGES/DRESSINGS) ×3 IMPLANT
DW OUTFLOW CASSETTE/TUBE SET (MISCELLANEOUS) ×3 IMPLANT
GAUZE SPONGE 4X4 12PLY STRL (GAUZE/BANDAGES/DRESSINGS) ×3 IMPLANT
GLOVE BIO SURGEON STRL SZ 6.5 (GLOVE) ×2 IMPLANT
GLOVE BIO SURGEONS STRL SZ 6.5 (GLOVE) ×1
GLOVE BIOGEL PI IND STRL 6.5 (GLOVE) ×1 IMPLANT
GLOVE BIOGEL PI IND STRL 7.0 (GLOVE) ×2 IMPLANT
GLOVE BIOGEL PI IND STRL 8 (GLOVE) ×1 IMPLANT
GLOVE BIOGEL PI INDICATOR 6.5 (GLOVE) ×2
GLOVE BIOGEL PI INDICATOR 7.0 (GLOVE) ×4
GLOVE BIOGEL PI INDICATOR 8 (GLOVE) ×2
GLOVE ECLIPSE 6.5 STRL STRAW (GLOVE) ×3 IMPLANT
GLOVE ECLIPSE 8.0 STRL XLNG CF (GLOVE) ×6 IMPLANT
GOWN STRL REUS W/ TWL LRG LVL3 (GOWN DISPOSABLE) ×3 IMPLANT
GOWN STRL REUS W/TWL LRG LVL3 (GOWN DISPOSABLE) ×6
GOWN STRL REUS W/TWL XL LVL3 (GOWN DISPOSABLE) ×3 IMPLANT
IMPL SPEEDBRIDGE KIT (Orthopedic Implant) ×1 IMPLANT
IMPLANT SPEEDBRIDGE KIT (Orthopedic Implant) ×3 IMPLANT
KIT PERC INSERT 3.0 KNTLS (KITS) ×3 IMPLANT
KIT STABILIZATION SHOULDER (MISCELLANEOUS) ×3 IMPLANT
KIT STR SPEAR 1.8 FBRTK DISP (KITS) ×6 IMPLANT
LASSO CRESCENT QUICKPASS (SUTURE) ×3 IMPLANT
MANIFOLD NEPTUNE II (INSTRUMENTS) ×3 IMPLANT
NDL SAFETY ECLIPSE 18X1.5 (NEEDLE) ×1 IMPLANT
NEEDLE HYPO 18GX1.5 SHARP (NEEDLE) ×2
NEEDLE SCORPION MULTI FIRE (NEEDLE) IMPLANT
PACK ARTHROSCOPY DSU (CUSTOM PROCEDURE TRAY) ×3 IMPLANT
PAD ORTHO SHOULDER 7X19 LRG (SOFTGOODS) ×3 IMPLANT
PASSPORT BUTTON CANNULA (MISCELLANEOUS) ×3
PORT APPOLLO RF 90DEGREE MULTI (SURGICAL WAND) ×3 IMPLANT
RESTRAINT HEAD UNIVERSAL NS (MISCELLANEOUS) ×3 IMPLANT
SET BASIN DAY SURGERY F.S. (CUSTOM PROCEDURE TRAY) ×3 IMPLANT
SHEET MEDIUM DRAPE 40X70 STRL (DRAPES) IMPLANT
SLEEVE SCD COMPRESS KNEE MED (MISCELLANEOUS) ×3 IMPLANT
SLING ARM FOAM STRAP LRG (SOFTGOODS) IMPLANT
SUT FIBERWIRE #2 38 T-5 BLUE (SUTURE)
SUT MNCRL AB 4-0 PS2 18 (SUTURE) ×3 IMPLANT
SUT PDS AB 1 CT  36 (SUTURE)
SUT PDS AB 1 CT 36 (SUTURE) IMPLANT
SUT TIGER TAPE 7 IN WHITE (SUTURE) IMPLANT
SUTURE FIBERWR #2 38 T-5 BLUE (SUTURE) IMPLANT
SUTURE TAPE TIGERLINK 1.3MM BL (SUTURE) IMPLANT
SUTURETAPE TIGERLINK 1.3MM BL (SUTURE)
SYR 5ML LL (SYRINGE) ×3 IMPLANT
TAPE FIBER 2MM 7IN #2 BLUE (SUTURE) ×3 IMPLANT
TISSUE ARTHOFLEX THICK 3MM (Tissue) ×3 IMPLANT
TOWEL GREEN STERILE FF (TOWEL DISPOSABLE) ×3 IMPLANT
TUBE CONNECTING 20'X1/4 (TUBING) ×1
TUBE CONNECTING 20X1/4 (TUBING) ×2 IMPLANT
TUBING ARTHROSCOPY IRRIG 16FT (MISCELLANEOUS) ×3 IMPLANT

## 2020-02-04 NOTE — Transfer of Care (Signed)
Immediate Anesthesia Transfer of Care Note  Patient: James Parrish  Procedure(s) Performed: SHOULDER ARTHROSCOPY DEBRIDEMENT  WITH SUBACROMIAL DECOMPRESSION AND DISTAL CLAVICLE EXCISION (Left Shoulder) SHOULDER ARTHROSCOPY WITH ROTATOR CUFF REPAIR WITH BICEP TENODESIS (Left Shoulder)  Patient Location: PACU  Anesthesia Type:General and Regional  Level of Consciousness: drowsy and patient cooperative  Airway & Oxygen Therapy: Patient Spontanous Breathing and Patient connected to face mask oxygen  Post-op Assessment: Report given to RN and Post -op Vital signs reviewed and stable  Post vital signs: Reviewed and stable  Last Vitals:  Vitals Value Taken Time  BP 146/81 02/04/20 1012  Temp    Pulse 82 02/04/20 1015  Resp 24 02/04/20 1015  SpO2 89 % 02/04/20 1015  Vitals shown include unvalidated device data.  Last Pain:  Vitals:   02/04/20 0642  TempSrc: Oral  PainSc: 5       Patients Stated Pain Goal: 3 ([id number])  Complications: No apparent anesthesia complications

## 2020-02-04 NOTE — Anesthesia Procedure Notes (Signed)
Procedure Name: Intubation Date/Time: 02/04/2020 7:35 AM Performed by: Gwyndolyn Saxon, CRNA Pre-anesthesia Checklist: Patient identified, Emergency Drugs available, Suction available and Patient being monitored Patient Re-evaluated:Patient Re-evaluated prior to induction Oxygen Delivery Method: Circle system utilized Preoxygenation: Pre-oxygenation with 100% oxygen Induction Type: IV induction Ventilation: Mask ventilation without difficulty and Oral airway inserted - appropriate to patient size Laryngoscope Size: Mac and 4 Grade View: Grade II Tube type: Oral Tube size: 7.5 mm Number of attempts: 1 Airway Equipment and Method: Patient positioned with wedge pillow and Stylet Placement Confirmation: ETT inserted through vocal cords under direct vision,  positive ETCO2 and breath sounds checked- equal and bilateral Secured at: 24 cm Tube secured with: Tape Dental Injury: Teeth and Oropharynx as per pre-operative assessment

## 2020-02-04 NOTE — Interval H&P Note (Signed)
History and Physical Interval Note:  02/04/2020 7:19 AM  James Parrish  has presented today for surgery, with the diagnosis of LEFT SHOULDER CARTLAGE DISORDER, IMPINGEMENT SYNDROME  ROTATOR CUFF TEAR. BICEP TENDONITIS.  The various methods of treatment have been discussed with the patient and family. After consideration of risks, benefits and other options for treatment, the patient has consented to  Procedure(s): SHOULDER ARTHROSCOPY DEBRIDEMENT  WITH SUBACROMIAL DECOMPRESSION AND DISTAL CLAVICLE EXCISION (Left) SHOULDER ARTHROSCOPY WITH ROTATOR CUFF REPAIR WITH BICEP TENODESIS (Left) as a surgical intervention.  The patient's history has been reviewed, patient examined, no change in status, stable for surgery.  I have reviewed the patient's chart and labs.  Questions were answered to the patient's satisfaction.     Hiram Gash

## 2020-02-04 NOTE — Anesthesia Procedure Notes (Signed)
Anesthesia Procedure Image    

## 2020-02-04 NOTE — Anesthesia Procedure Notes (Signed)
Anesthesia Regional Block: Interscalene brachial plexus block   Pre-Anesthetic Checklist: ,, timeout performed, Correct Patient, Correct Site, Correct Laterality, Correct Procedure, Correct Position, site marked, Risks and benefits discussed,  Surgical consent,  Pre-op evaluation,  At surgeon's request and post-op pain management  Laterality: Left  Prep: chloraprep       Needles:  Injection technique: Single-shot  Needle Type: Echogenic Needle     Needle Length: 9cm      Additional Needles:   Procedures:,,,, ultrasound used (permanent image in chart),,,,  Narrative:  Start time: 02/04/2020 6:53 AM End time: 02/04/2020 7:05 AM Injection made incrementally with aspirations every 5 mL.  Performed by: Personally  Anesthesiologist: Myrtie Soman, MD  Additional Notes: Patient tolerated the procedure well without complications

## 2020-02-04 NOTE — Anesthesia Preprocedure Evaluation (Signed)
Anesthesia Evaluation  Patient identified by MRN, date of birth, ID band Patient awake    Reviewed: Allergy & Precautions, NPO status , Patient's Chart, lab work & pertinent test results  Airway Mallampati: II  TM Distance: >3 FB Neck ROM: Full    Dental no notable dental hx.    Pulmonary neg pulmonary ROS, former smoker,    Pulmonary exam normal breath sounds clear to auscultation       Cardiovascular hypertension, Normal cardiovascular exam Rhythm:Regular Rate:Normal     Neuro/Psych negative neurological ROS  negative psych ROS   GI/Hepatic negative GI ROS, Neg liver ROS,   Endo/Other  diabetes  Renal/GU negative Renal ROS  negative genitourinary   Musculoskeletal negative musculoskeletal ROS (+)   Abdominal   Peds negative pediatric ROS (+)  Hematology negative hematology ROS (+)   Anesthesia Other Findings   Reproductive/Obstetrics negative OB ROS                             Anesthesia Physical Anesthesia Plan  ASA: II  Anesthesia Plan: General   Post-op Pain Management:  Regional for Post-op pain   Induction: Intravenous  PONV Risk Score and Plan: 2 and Ondansetron, Dexamethasone and Treatment may vary due to age or medical condition  Airway Management Planned: Oral ETT  Additional Equipment:   Intra-op Plan:   Post-operative Plan: Extubation in OR  Informed Consent: I have reviewed the patients History and Physical, chart, labs and discussed the procedure including the risks, benefits and alternatives for the proposed anesthesia with the patient or authorized representative who has indicated his/her understanding and acceptance.     Dental advisory given  Plan Discussed with: CRNA and Surgeon  Anesthesia Plan Comments:         Anesthesia Quick Evaluation

## 2020-02-04 NOTE — Progress Notes (Signed)
Assisted Dr. Rose with left, ultrasound guided, interscalene  block. Side rails up, monitors on throughout procedure. See vital signs in flow sheet. Tolerated Procedure well.  

## 2020-02-04 NOTE — Anesthesia Postprocedure Evaluation (Signed)
Anesthesia Post Note  Patient: James Parrish  Procedure(s) Performed: SHOULDER ARTHROSCOPY DEBRIDEMENT  WITH SUBACROMIAL DECOMPRESSION AND DISTAL CLAVICLE EXCISION (Left Shoulder) SHOULDER ARTHROSCOPY WITH ROTATOR CUFF REPAIR WITH BICEP TENODESIS (Left Shoulder)     Patient location during evaluation: PACU Anesthesia Type: General Level of consciousness: awake and alert Pain management: pain level controlled Vital Signs Assessment: post-procedure vital signs reviewed and stable Respiratory status: spontaneous breathing, nonlabored ventilation, respiratory function stable and patient connected to nasal cannula oxygen Cardiovascular status: blood pressure returned to baseline and stable Postop Assessment: no apparent nausea or vomiting Anesthetic complications: no    Last Vitals:  Vitals:   02/04/20 1031 02/04/20 1045  BP: (!) 160/97 118/86  Pulse: 80 85  Resp: 18 19  Temp:    SpO2: 94% (!) 89%    Last Pain:  Vitals:   02/04/20 1042  TempSrc:   PainSc: 0-No pain                 Harland Aguiniga S

## 2020-02-04 NOTE — Op Note (Signed)
Orthopaedic Surgery Operative Note (CSN: [id number])  CINCH ORMOND  04/15/1966 Date of Surgery: 02/04/2020   Diagnoses:  Left shoulder rotator cuff tear, SLAP tear, distal clavicle arthrosis, impingement  Procedure: Arthroscopic extensive debridement Arthroscopic subacromial decompression Arthroscopic rotator cuff repair Arthroscopic biceps tenodesis Arthroscopic distal clavicle excision Arthroscopic superior capsular reconstruction  Modifier 22 for rotator cuff repair x 2 -patient superior capsular reconstruction which is billed as a rotator cuff repair in addition to a subscapularis repair meant that this was much more difficult time-consuming procedure as compared to normal.   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  Successful completion of the planned procedure.  Patient's tear was clearly an acute on chronic type issue and perhaps his subscapularis was the more acute portion that happened while he was at work.  His superior cuff was completely irreparable but his joint looked reasonable.  Based on this we had good fixation of his superior capsular reconstruction.  This is a much more difficult and time-consuming procedure than a typical rotator cuff repair.  We felt that modifier 22 was appropriate based on this.   Post-operative plan: The patient will be non-weightbearing in a sling for 6 weeks  with therapy to start in a delayed fashion.  The patient will be discharged home.  DVT prophylaxis not indicated in ambulatory upper extremity patient without known risk factors.   Pain control with PRN pain medication preferring oral medicines.  Follow up plan will be scheduled in approximately 7 days for incision check and XR.  If he fails this he would be a candidate for reverse total shoulder arthroplasty as he is on the contralateral side.  Post-Op Diagnosis: Same Surgeons:Primary: Hiram Gash, MD Assistants:Caroline McBane PA-C Location: [facility] OR ROOM 6 Anesthesia: General with Exparel interscalene block Antibiotics: Ancef 3 g Tourniquet time: None Estimated Blood Loss: Minimal Complications: None Specimens: None Implants: Implant Name Type Inv. Item Serial No. Manufacturer Lot No. LRB No. Used Action  IMPLANT SPEEDBRIDGE KIT - [id number] Orthopedic Implant IMPLANT SPEEDBRIDGE KIT  [employer] [id number] Left 1 Implanted  ANCHOR SUT 1.8 FIBERTAK 2 SUT - [id number] Anchor ANCHOR SUT 1.8 FIBERTAK 2 SUT  [employer] [id number] Left 1 Implanted  ANCHOR SUT 1.8 FIBERTAK 2 SUT - [id number] Anchor ANCHOR SUT 1.8 FIBERTAK 2 SUT  [employer] [id number] Left 1 Implanted  ANCHOR SUT 1.8 FIBERTAK 2 SUT - [id number] Anchor ANCHOR SUT 1.8 FIBERTAK 2 SUT  [employer] [id number] Left 1 Implanted  ANCHOR SUT BIO SW 4.75X19.1 - [id number] Anchor ANCHOR SUT BIO SW 4.75X19.1  [employer] [id number] Left 1 Implanted  TISSUE ARTHOFLEX THICK 3MM - [id number] Tissue TISSUE ARTHOFLEX THICK 3MM [id number] [employer] [facility] [id number] Left 1 Implanted  ANCHOR SUT 1.8 FIBERTAK 2 SUT - [id number] Anchor ANCHOR SUT 1.8 FIBERTAK 2 SUT  [employer] [id number] Left 1 Implanted  ANCHOR SUT 1.8 FIBERTAK 2 SUT - [id number] Anchor ANCHOR SUT 1.8 FIBERTAK 2 SUT  [employer] [id number] Left 1 Implanted  Indications for Surgery:   James Parrish is a 54 y.o. male with continued shoulder pain refractory to nonoperative  measures for extended period of time.  Patient had a work-related injury that was likely an acute injury on top of a chronic pathology.  The risks and benefits were explained at length including but not limited to continued pain, cuff failure, biceps tenodesis failure, stiffness, need for further surgery and infection.   Procedure:   Patient was correctly identified in the preoperative holding area and operative site marked.  Patient brought to OR and positioned beachchair on an Dearing table ensuring that all bony prominences were padded and the head was in an appropriate location.  Anesthesia was induced and the operative shoulder was prepped and draped in the usual sterile fashion.  Timeout was called preincision.  A standard posterior viewing portal was made after localizing the portal with a spinal needle.  An anterior accessory portal was also made.  After clearing the articular space the camera was positioned in the subacromial space.  Findings above.    Extensive debridement was performed of the anterior interval tissue, labral fraying and the bursa.  Subacromial decompression: We made a lateral portal with spinal needle guidance. We then proceeded to debride bursal tissue extensively with a shaver and arthrocare device. At that point we continued to identify the borders of the acromion and identify the spur. We then carefully preserved the deltoid fascia and used a burr to convert the acromion to a Type 1 flat acromion without issue.  Biceps tenodesis: We marked the tendon and then performed a tenotomy and debridement of the stump in the articular space. We then identified the biceps tendon in its groove suprapec with the arthroscope in the lateral portal taking care to move from lateral to medial to avoid injury to the subscapularis. At that point we unroofed the tendon itself and mobilized it. An accessory anterior portal was made in line with the tendon and we grasped it from the anterior  superior portal and worked from the accessory anterior portal. Two Fibertak 1.76m knotless anchors were placed in the groove and the tendon was secured in a luggage loop style fashion with a pass of the limb of suture through the tendon using a scorpion device to avoid pull-through.  Repair was completed with good tension on the tendon.  Residual stump of the tendon was removed after being resected with a RF ablator.  Distal Clavicle resection:  The scope was placed in the subacromial space from the posterior portal.  A hemostat was placed through the anterior portal and we spread at the [facility].  A burr was then inserted and 10 mm of distal clavicle was resected taking care to avoid damage to the capsule around the joint and avoiding overhanging bone posteriorly.    Subscapularis Repair: We identified a subscapularis tear that involved the upper 25% of the tendon.  Using a grasping device were able to demonstrate that the tendon could be reapproximated to the lesser tuberosity.  We felt that it was repairable.  We then used a RF ablator to open the rotator interval and released the MGHL to allow further translation of the subscapularis.  This point we cleared both anterior and posterior to the tendon taking care to avoid inferior migration to avoid the neurovascular structures as well as the muscular cutaneous nerve.  We then used a lasso to pass a fiber tape in a mattress fashion through the subscapularis and based 4.75 swivel  lock was used to repair back to the lesser tuberosity after prepping the tuberosity extensively.  We had good approximation of the tendon and a recreation of the rolled border.  Superior capsular reconstruction: Once we determined that the rotator cuff superiorly was irreparable and that the joint was amenable to it we decided to proceed with a superior capsular reconstruction.  We cleared the soft tissue and the remaining cuff from both the tuberosity as well as the superior aspect  of the glenoid.  Bone was repaired for healing with cortical abrasion using a shaver.  At that point we were able to start using percutaneously placed portals to place our fiber tack anchors into the glenoid.  These were knotless anchors and each placed x3 without issue encompassing both the posterior and anterior extents of the cuffs origin over the glenoid as well as a central anchor.  We then placed 2 swivel lock anchors at the medial border of the articular surface with tapes loaded from the speed bridge kit.  We placed the arm in neutral rotation and 20-30 degrees of abduction.At this point we used the Arthrex measuring device to measure the dimensions of the glenoid as well as the articular surface and took measurements to use to make the dermal graft.  We added 5 mm of with both to the anterior and posterior dimension of the graft as well as the medial aspect of the graft.  We added 10 to the lateral aspect.    We then prepared the graft and prepared our holes in the graft for our suture placement.  Once this was complete we took great care using a cannula divider to pass each of the medial glenoid based anchors through the graft in typical fashion before placing our medial row of our tuberosity fixation through the graft.  We shuttled the graft into the joint pushing it in with a grasper.  We then used a probe as well as sequentially tightening medial anchors to anchor the graft to the bone of the glenoid.  This point we performed a speed bridge repair in typical fashion forming a box an X configuration of the lateral footprint over the cuff.  We were able to secure the lateral row with 2 4.75 mm swivel locks in typical fashion.  This point we had a good repair and good recreation of the superior capsule.  We then used a scorpion to perform side-to-side repairs of the subscapularis and the posterior cuff with 2 sutures in the posterior cuff and one in the subscapularis.  We had a great overall  reconstruction and good tension   The incisions were closed with absorbable monocryl and steri strips.  A sterile dressing was placed along with a sling. The patient was awoken from general anesthesia and taken to the PACU in stable condition without complication.   Noemi Chapel, PA-C, present and scrubbed throughout the case, critical for completion in a timely fashion, and for retraction, instrumentation, closure.

## 2020-02-04 NOTE — Discharge Instructions (Signed)
Post Anesthesia Home Care Instructions  Activity: Get plenty of rest for the remainder of the day. A responsible individual must stay with you for 24 hours following the procedure.  For the next 24 hours, DO NOT: -Drive a car -Operate machinery -Drink alcoholic beverages -Take any medication unless instructed by your physician -Make any legal decisions or sign important papers.  Meals: Start with liquid foods such as gelatin or soup. Progress to regular foods as tolerated. Avoid greasy, spicy, heavy foods. If nausea and/or vomiting occur, drink only clear liquids until the nausea and/or vomiting subsides. Call your physician if vomiting continues.  Special Instructions/Symptoms: Your throat may feel dry or sore from the anesthesia or the breathing tube placed in your throat during surgery. If this causes discomfort, gargle with warm salt water. The discomfort should disappear within 24 hours.  If you had a scopolamine patch placed behind your ear for the management of post- operative nausea and/or vomiting:  1. The medication in the patch is effective for 72 hours, after which it should be removed.  Wrap patch in a tissue and discard in the trash. Wash hands thoroughly with soap and water. 2. You may remove the patch earlier than 72 hours if you experience unpleasant side effects which may include dry mouth, dizziness or visual disturbances. 3. Avoid touching the patch. Wash your hands with soap and water after contact with the patch.      Regional Anesthesia Blocks  1. Numbness or the inability to move the "blocked" extremity may last from 3-48 hours after placement. The length of time depends on the medication injected and your individual response to the medication. If the numbness is not going away after 48 hours, call your surgeon.  2. The extremity that is blocked will need to be protected until the numbness is gone and the  Strength has returned. Because you cannot feel it, you  will need to take extra care to avoid injury. Because it may be weak, you may have difficulty moving it or using it. You may not know what position it is in without looking at it while the block is in effect.  3. For blocks in the legs and feet, returning to weight bearing and walking needs to be done carefully. You will need to wait until the numbness is entirely gone and the strength has returned. You should be able to move your leg and foot normally before you try and bear weight or walk. You will need someone to be with you when you first try to ensure you do not fall and possibly risk injury.  4. Bruising and tenderness at the needle site are common side effects and will resolve in a few days.  5. Persistent numbness or new problems with movement should be communicated to the surgeon or the [facility] ([phone])/ [facility] ([phone]).  Information for Discharge Teaching: EXPAREL (bupivacaine liposome injectable suspension)   Your surgeon or anesthesiologist gave you EXPAREL(bupivacaine) to help control your pain after surgery.   EXPAREL is a local anesthetic that provides pain relief by numbing the tissue around the surgical site.  EXPAREL is designed to release pain medication over time and can control pain for up to 72 hours.  Depending on how you respond to EXPAREL, you may require less pain medication during your recovery.  Possible side effects:  Temporary loss of sensation or ability to move in the area where bupivacaine was injected.  Nausea, vomiting, constipation  Rarely,   numbness and tingling in your mouth or lips, lightheadedness, or anxiety may occur.  Call your doctor right away if you think you may be experiencing any of these sensations, or if you have other questions regarding possible side effects.  Follow all other discharge instructions given to you by your surgeon or nurse. Eat a healthy diet and drink plenty of water or other  fluids.  If you return to the hospital for any reason within 96 hours following the administration of EXPAREL, it is important for health care providers to know that you have received this anesthetic. A teal colored band has been placed on your arm with the date, time and amount of EXPAREL you have received in order to alert and inform your health care providers. Please leave this armband in place for the full 96 hours following administration, and then you may remove the band. 

## 2020-02-05 ENCOUNTER — Encounter: Payer: Self-pay | Admitting: *Deleted

## 2020-02-13 ENCOUNTER — Ambulatory Visit: Payer: 59 | Attending: Internal Medicine

## 2020-02-13 DIAGNOSIS — Z23 Encounter for immunization: Secondary | ICD-10-CM

## 2020-02-13 NOTE — Progress Notes (Signed)
   Covid-19 Vaccination Clinic  Name:  FENG DEVICH    MRN: [id number] DOB: Jan 24, 1966  02/13/2020  Mr. Puzon was observed post Covid-19 immunization for 15 minutes without incident. He was provided with Vaccine Information Sheet and instruction to access the V-Safe system.   Mr. Chrest was instructed to call 911 with any severe reactions post vaccine: [name] Kitchen Difficulty breathing  . Swelling of face and throat  . A fast heartbeat  . A bad rash all over body  . Dizziness and weakness   Immunizations Administered    Name Date Dose VIS Date Route   Pfizer COVID-19 Vaccine 02/13/2020 11:19 AM 0.3 mL 12/09/2018 Intramuscular   Manufacturer: [place]   Lot: [id number]   [place]: [id number]

## 2020-02-15 ENCOUNTER — Encounter: Payer: Self-pay | Admitting: Internal Medicine

## 2020-02-21 ENCOUNTER — Other Ambulatory Visit: Payer: Self-pay | Admitting: Internal Medicine

## 2020-03-07 ENCOUNTER — Ambulatory Visit: Payer: 59 | Attending: Internal Medicine

## 2020-03-07 DIAGNOSIS — Z23 Encounter for immunization: Secondary | ICD-10-CM

## 2020-03-07 NOTE — Progress Notes (Signed)
   Covid-19 Vaccination Clinic  Name:  James Parrish    MRN: [id number] DOB: Feb 08, 1966  03/07/2020  [name] was observed post Covid-19 immunization for 15 minutes without incident. He was provided with Vaccine Information Sheet and instruction to access the V-Safe system.   [name] was instructed to call 911 with any severe reactions post vaccine: [name] Kitchen Difficulty breathing  . Swelling of face and throat  . A fast heartbeat  . A bad rash all over body  . Dizziness and weakness   Immunizations Administered    Name Date Dose VIS Date Route   Pfizer COVID-19 Vaccine 03/07/2020 11:03 AM 0.3 mL 12/09/2018 Intramuscular   Manufacturer: [place]   Lot: [id number]   [place]: [id number]

## 2020-03-07 NOTE — Progress Notes (Signed)
   Covid-19 Vaccination Clinic  Name:  James Parrish    MRN: [id number] DOB: 1965/12/20  03/07/2020  [name] was observed post Covid-19 immunization for 15 minutes without incident. He was provided with Vaccine Information Sheet and instruction to access the V-Safe system.   [name] was instructed to call 911 with any severe reactions post vaccine: [name] Kitchen Difficulty breathing  . Swelling of face and throat  . A fast heartbeat  . A bad rash all over body  . Dizziness and weakness   Immunizations Administered    Name Date Dose VIS Date Route   Pfizer COVID-19 Vaccine 03/07/2020 11:03 AM 0.3 mL 12/09/2018 Intramuscular   Manufacturer: [place]   Lot: [id number]   [place]: [id number]

## 2020-04-13 ENCOUNTER — Other Ambulatory Visit (HOSPITAL_BASED_OUTPATIENT_CLINIC_OR_DEPARTMENT_OTHER): Payer: Self-pay

## 2020-04-13 DIAGNOSIS — R0681 Apnea, not elsewhere classified: Secondary | ICD-10-CM

## 2020-04-14 ENCOUNTER — Ambulatory Visit (HOSPITAL_BASED_OUTPATIENT_CLINIC_OR_DEPARTMENT_OTHER): Payer: 59 | Attending: Internal Medicine | Admitting: Cardiology

## 2020-04-14 ENCOUNTER — Other Ambulatory Visit: Payer: Self-pay

## 2020-04-14 DIAGNOSIS — G4733 Obstructive sleep apnea (adult) (pediatric): Secondary | ICD-10-CM | POA: Diagnosis present

## 2020-04-14 DIAGNOSIS — R0902 Hypoxemia: Secondary | ICD-10-CM | POA: Insufficient documentation

## 2020-04-14 DIAGNOSIS — R0681 Apnea, not elsewhere classified: Secondary | ICD-10-CM

## 2020-04-20 NOTE — Procedures (Signed)
   Patient Name: James Parrish, James Parrish Date: 04/14/2020 Gender: Male D.O.B: [birth year] Age (years): 54 Referring Provider: Sueanne Margarita DO Height (inches): 74 Interpreting Physician: Fransico Him MD, ABSM Weight (lbs): 267 RPSGT: Jonna Coup BMI: 34 MRN: [id number] Neck Size: 18.00  CLINICAL INFORMATION Sleep Study Type: HST  Indication for sleep study: N/A  Epworth Sleepiness Score: 11  SLEEP STUDY TECHNIQUE A multi-channel overnight portable sleep study was performed. The channels recorded were: nasal airflow, thoracic respiratory movement, and oxygen saturation with a pulse oximetry. Snoring was also monitored.  MEDICATIONS Patient self administered medications include: N/A.  SLEEP ARCHITECTURE Patient was studied for 415.7 minutes. The sleep efficiency was 99.9 % and the patient was supine for 99.6%. The arousal index was 0.0 per hour.  RESPIRATORY PARAMETERS The overall AHI was 79.1 per hour, with a central apnea index of 0.0 per hour.  The oxygen nadir was 77% during sleep.  CARDIAC DATA Mean heart rate during sleep was 73.0 bpm.  IMPRESSIONS - Severe obstructive sleep apnea occurred during this study (AHI = 79.1/h). - No significant central sleep apnea occurred during this study (CAI = 0.0/h). - Severe oxygen desaturation was noted during this study (Min O2 = 77%). - Patient snored 2.6% during the sleep.  DIAGNOSIS - Obstructive Sleep Apnea (327.23 [G47.33 ICD-10]) - Nocturnal Hypoxemia (327.26 [G47.36 ICD-10])  RECOMMENDATIONS - Recommend in lab CPAP titration due to severity of sleep disordered breathing and hypoxemia.  - Positional therapy avoiding supine position during sleep. - Avoid alcohol, sedatives and other CNS depressants that may worsen sleep apnea and disrupt normal sleep architecture. - Sleep hygiene should be reviewed to assess factors that may improve sleep quality. - Weight management and regular exercise should be initiated or  continued.  [Electronically signed] 04/20/2020 10:06 AM  Fransico Him MD, ABSM Diplomate, American Board of Sleep Medicine

## 2020-04-26 ENCOUNTER — Telehealth: Payer: Self-pay | Admitting: *Deleted

## 2020-04-26 NOTE — Telephone Encounter (Signed)
PA submitted to [facility] for CPAP titration via web portal.

## 2020-04-27 ENCOUNTER — Encounter: Payer: Self-pay | Admitting: Internal Medicine

## 2020-04-27 NOTE — Telephone Encounter (Signed)
Can you please reschedule this patient? 

## 2020-05-06 ENCOUNTER — Ambulatory Visit: Payer: 59 | Admitting: Internal Medicine

## 2020-05-11 ENCOUNTER — Ambulatory Visit: Payer: 59 | Admitting: Internal Medicine

## 2020-05-11 ENCOUNTER — Encounter: Payer: Self-pay | Admitting: Internal Medicine

## 2020-05-11 ENCOUNTER — Other Ambulatory Visit: Payer: Self-pay

## 2020-05-11 VITALS — BP 132/88 | HR 89 | Ht 74.0 in | Wt 273.8 lb

## 2020-05-11 DIAGNOSIS — Z794 Long term (current) use of insulin: Secondary | ICD-10-CM | POA: Insufficient documentation

## 2020-05-11 DIAGNOSIS — E119 Type 2 diabetes mellitus without complications: Secondary | ICD-10-CM | POA: Insufficient documentation

## 2020-05-11 LAB — TSH: TSH: 0.9 (ref 0.41–5.90)

## 2020-05-11 LAB — MICROALBUMIN, URINE: Microalb, Ur: 5

## 2020-05-11 LAB — POCT GLYCOSYLATED HEMOGLOBIN (HGB A1C): Hemoglobin A1C: 6.9 % — AB (ref 4.0–5.6)

## 2020-05-11 LAB — LIPID PANEL
Cholesterol: 125 (ref 0–200)
HDL: 29 — AB (ref 35–70)
LDL Cholesterol: 41
Triglycerides: 275 — AB (ref 40–160)

## 2020-05-11 LAB — PSA: PSA: 0.374

## 2020-05-11 MED ORDER — EMPAGLIFLOZIN 25 MG PO TABS: 25.0000 mg | ORAL_TABLET | Freq: Every day | ORAL | 3 refills | Status: DC

## 2020-05-11 MED ORDER — METFORMIN HCL 1000 MG PO TABS: 1000.0000 mg | ORAL_TABLET | Freq: Two times a day (BID) | ORAL | 3 refills | Status: DC

## 2020-05-11 MED ORDER — OZEMPIC (0.25 OR 0.5 MG/DOSE) 2 MG/1.5ML ~~LOC~~ SOPN: 0.5000 mg | PEN_INJECTOR | SUBCUTANEOUS | 2 refills | Status: DC

## 2020-05-11 MED ORDER — BASAGLAR KWIKPEN 100 UNIT/ML ~~LOC~~ SOPN: 38.0000 [IU] | PEN_INJECTOR | Freq: Every day | SUBCUTANEOUS | 6 refills | Status: DC

## 2020-05-11 NOTE — Patient Instructions (Addendum)
-   Keep Up the [place] !!! You have done an amazing work with an A1c of 6.9%    - Continue Ozempic 0.5 mg weekly  - Continue Basaglar 38 units daily  - Continue Metformin 1000 mg, 1 tablet twice daily  - Continue Jardiance 25 mg, 1 tablet with Breakfast      HOW TO TREAT LOW BLOOD SUGARS (Blood sugar LESS THAN 70 MG/DL)  Please follow the RULE OF 15 for the treatment of hypoglycemia treatment (when your (blood sugars are less than 70 mg/dL)    STEP 1: Take 15 grams of carbohydrates when your blood sugar is low, which includes:   3-4 GLUCOSE TABS  OR  3-4 OZ OF JUICE OR REGULAR SODA OR  ONE TUBE OF GLUCOSE GEL     STEP 2: RECHECK blood sugar in 15 MINUTES STEP 3: If your blood sugar is still low at the 15 minute recheck --> then, go back to STEP 1 and treat AGAIN with another 15 grams of carbohydrates.

## 2020-05-11 NOTE — Progress Notes (Signed)
Name: James Parrish  Age/ Sex: 54 y.o., male   MRN/ DOB: [id number], December 18, 1965     PCP: Sueanne Margarita, DO   Reason for Endocrinology Evaluation: Type 2 Diabetes Mellitus  Initial Endocrine Consultative Visit: 07/13/2019    PATIENT IDENTIFIER: James Parrish is a 54 y.o. male with a past medical history of T2Dm, HTN and Dyslipidemia. The patient has followed with Endocrinology clinic since 07/13/2019 for consultative assistance with management of his diabetes.  DIABETIC HISTORY:  James Parrish was diagnosed with DM prior to 2010, was initially on oral glycemic agents, insulin started in 2013. His hemoglobin A1c has ranged from 7.2% in 2018, peaking at 8.8% in 2019.  On his initial visit to our clinic, his A1c 7.7% . He was on lantus, victoza, metformin and Jardiance. We switched Victoza to Ozempic.    He is a truck driver SUBJECTIVE:   During the last visit (12/25/2019): A1c 7.5% . We increased Ozempic, and Lantus, continued Jardiance,and  Metformin  Today (05/11/2020): James Parrish is here for a 4 month follow up on diabetes.  He checks his blood sugars 1 times . The patient has not had hypoglycemic episodes since the last clinic visit.   He had a successful left shoulder surgery, having PT. Pending right shoulder surgery.      HOME DIABETES REGIMEN:  Ozempic 0.5 mg weekly ( Monday )  Jardiance 25 mg daily  Metformin 1000 mg Twice daily  Lantus 38 units daily     DIABETIC COMPLICATIONS: Microvascular complications:    Denies: CKD, retinopathy, neuropathy   Last eye exam: Completed 2020  Macrovascular complications:    Denies: CAD, PVD, CVA  METER DOWNLOAD SUMMARY: 7/15-7/28/2021 Fingerstick Blood Glucose Tests = 18 Average Number Tests/Day = 1.3 Overall Mean FS Glucose = 133   BG Ranges: Low = 102 High = 162   Hypoglycemic Events/30 Days: BG < 50 = 0 Episodes of symptomatic severe hypoglycemia = 0    HISTORY:  Past Medical History:   Past Medical History:  Diagnosis Date   Complication of anesthesia    hard to wake up    Diabetes mellitus without complication [facility])    ED (erectile dysfunction)    History of COVID-19 04/2019   Hyperlipidemia    Hypertension    Past Surgical History:  Past Surgical History:  Procedure Laterality Date   COLONOSCOPY     finger surgery Right    Index   KNEE SURGERY Left    KNEE SURGERY Right    ORIF PATELLA Right    SHOULDER ARTHROSCOPY WITH ROTATOR CUFF REPAIR Left 02/04/2020   Procedure: SHOULDER ARTHROSCOPY WITH ROTATOR CUFF REPAIR WITH BICEP TENODESIS;  Surgeon: Hiram Gash, MD;  Location: [facility];  Service: Orthopedics;  Laterality: Left;    Social History:  reports that he quit smoking about 4 months ago. His smoking use included cigarettes. He has a 30.00 pack-year smoking history. He has never used smokeless tobacco. He reports previous alcohol use. He reports that he does not use drugs. Family History:  Family History  Problem Relation Age of Onset   Cancer Mother    Hypertension Mother    Diabetes Mother    Venous thrombosis Son      HOME MEDICATIONS: Allergies as of 05/11/2020   No Known Allergies     Medication List       Accurate as of May 11, 2020  1:38 PM. If you have any questions,  ask your nurse or doctor.        amLODipine 10 MG tablet Commonly known as: NORVASC Take 10 mg by mouth at bedtime.   aspirin 81 MG EC tablet Take 81 mg by mouth daily.   Aspirin Buf(CaCarb-MgCarb-MgO) 81 MG Tabs Take 81 mg by mouth daily.   Basaglar KwikPen 100 UNIT/ML Inject 38 Units into the skin at bedtime.   Cialis 20 MG tablet Generic drug: tadalafil Take 20 mg by mouth daily as needed (ED).   CINNAMON PO Take 1,000 mg by mouth daily.   glucose blood test strip Use as instructed to test blood sugar 2 times daily E11.65    Onetouch strips   hydrochlorothiazide 25 MG tablet Commonly known as: HYDRODIURIL Take 25  mg by mouth daily.   Jardiance 10 MG Tabs tablet Generic drug: empagliflozin Take 10 mg by mouth daily.   Lipitor 10 MG tablet Generic drug: atorvastatin Take 10 mg by mouth daily.   losartan 100 MG tablet Commonly known as: COZAAR Take 100 mg by mouth daily.   meloxicam 7.5 MG tablet Commonly known as: Mobic Take 1 tablet (7.5 mg total) by mouth daily.   metFORMIN 1000 MG tablet Commonly known as: GLUCOPHAGE Take 1,000 mg by mouth in the morning and at bedtime.   metoprolol tartrate 100 MG tablet Commonly known as: LOPRESSOR Take 100 mg by mouth in the morning and at bedtime.   multivitamin with minerals Tabs tablet Take 1 tablet by mouth daily.   Ozempic (0.25 or 0.5 MG/DOSE) 2 MG/1.5ML Sopn Generic drug: Semaglutide(0.25 or 0.5MG /DOS) DIAL AND INJECT UNDER THE SKIN 0.5 MG WEEKLY   spironolactone 50 MG tablet Commonly known as: ALDACTONE Take 50 mg by mouth daily.        OBJECTIVE:   Vital Signs: BP (!) 132/88 (BP Location: Right Arm, Patient Position: Sitting, Cuff Size: Large)    Pulse 89    Ht 6\' 2"  (1.88 m)    Wt (!) 273 lb 12.8 oz (124.2 kg)    SpO2 97%    BMI 35.15 kg/m   Wt Readings from Last 3 Encounters:  05/11/20 (!) 273 lb 12.8 oz (124.2 kg)  04/14/20 267 lb (121.1 kg)  02/04/20 275 lb 5.7 oz (124.9 kg)     Exam: General: Pt appears well and is in NAD  Lungs: Clear with good BS bilat with no rales, rhonchi, or wheezes  Heart: RRR with normal S1 and S2 and no gallops; no murmurs; no rub  Abdomen: Normoactive bowel sounds, soft, nontender, without masses or organomegaly palpable  Extremities: No pretibial edema.   Neuro: MS is good with appropriate affect, pt is alert and Ox3    DM foot exam: 12/28/2019 The skin of the feet is intact without sores or ulcerations. The pedal pulses are 2+ on right and 2+ on left. The sensation is intact to a screening 5.07, 10 gram monofilament bilaterally     DATA REVIEWED:  Lab Results  Component Value  Date   HGBA1C 6.9 (A) 05/11/2020   HGBA1C 7.5 (A) 12/25/2019   HGBA1C 7.2 (A) 10/13/2019   Lab Results  Component Value Date   CREATININE 0.87 01/25/2020    08/05/19  Microalb/cr ratio 5.5  LDL 44 mg/dL   ASSESSMENT / PLAN / RECOMMENDATIONS:   1) Type 2 Diabetes Mellitus, Optimally controlled, Without  complications - Most recent A1c of 6.9 %. Goal A1c < 7.0 %.    - I have praised the pt on optimal glucose  control - He just returned from vacation and is motivated to start working out again and go back on low carb diet.  - He was advised to contact me should he notice hyperglycemia, so I can increase his Ozempic to 1 mg weekly     MEDICATIONS: -Continue Ozempic back to 0.5 mg weekly  -Continue Jardiance 25 mg daily  -Continue Metformin 1000 mg Twice daily  -Continue Basaglar 38 units daily    EDUCATION / INSTRUCTIONS:  BG monitoring instructions: Patient is instructed to check his blood sugars 1 time a day.  Call [facility] if: BG persistently < 70   I reviewed the Rule of 15 for the treatment of hypoglycemia in detail with the patient. Literature supplied.      F/U in 6 months    Signed electronically by: Mack Guise, MD  [facility] Endocrinology  [facility] [address] [place], [place] [address] Phone: [phone] FAX: [phone]   CC: Sueanne Margarita, [place] [address] Phone: [phone]  Fax: [phone]  Return to Endocrinology clinic as below: Future Appointments  Date Time Provider [place]  06/06/2020  9:00 AM Rigoberto Noel, MD [facility] None

## 2020-05-17 ENCOUNTER — Encounter: Payer: Self-pay | Admitting: Internal Medicine

## 2020-05-18 ENCOUNTER — Telehealth: Payer: Self-pay | Admitting: *Deleted

## 2020-05-18 ENCOUNTER — Telehealth: Payer: Self-pay

## 2020-05-18 DIAGNOSIS — G4733 Obstructive sleep apnea (adult) (pediatric): Secondary | ICD-10-CM

## 2020-05-18 DIAGNOSIS — Z794 Long term (current) use of insulin: Secondary | ICD-10-CM

## 2020-05-18 NOTE — Telephone Encounter (Signed)
Per Dr. Theodosia Blender recommendation patient will need a CPAP titration. Pre-cert initiated on 3/49/17. Sent staff message to Sleep precert to get a status update on patients precert. Order for Titration is in.

## 2020-05-18 NOTE — Telephone Encounter (Signed)
Staff message sent to Gae Bon ok to schedule CPAP titration. [employer] Auth received.Auth #[id number].

## 2020-05-19 ENCOUNTER — Telehealth: Payer: Self-pay | Admitting: Gastroenterology

## 2020-05-19 NOTE — Telephone Encounter (Signed)
Hey Dr. Rush Landmark, this patient is being referred for a colonoscopy. Patient had last colonoscopy in 02/2015. The records are in epic. Please advise on scheduling. Thank you!

## 2020-05-19 NOTE — Telephone Encounter (Signed)
Prior guidelines would have suggested in setting of history of adenomatous colon polyps and also finding of a colon polyp a 5-year follow up would be recommended. Guidelines have changed recently. I still recommend we keep the previous recommendation, since I have not personally done his last colonoscopy. Plan for Colonoscopy now and updated guidelines to proceed based on findings on this procedure. Thank you. GM

## 2020-05-23 ENCOUNTER — Encounter: Payer: Self-pay | Admitting: Gastroenterology

## 2020-05-23 ENCOUNTER — Telehealth: Payer: Self-pay | Admitting: *Deleted

## 2020-05-23 NOTE — Telephone Encounter (Signed)
-----   Message from Lauralee Evener, [place] sent at 05/18/2020  2:56 PM EDT ----- Ok to schedule CPAP titration. Received [employer] auth today. Auth# [id number].  ----- Message ----- From: Lauralee Evener, CMA Sent: 04/26/2020  10:07 AM EDT To: [facility]  CPAP titration

## 2020-05-23 NOTE — Telephone Encounter (Signed)
Patient scheduled for 10/19.

## 2020-05-23 NOTE — Telephone Encounter (Signed)
Patient is scheduled for CPAP Titration on 06/28/20. Patient understands his titration study will be done at [facility] sleep lab. Patient understands he will receive a letter in a week or so detailing appointment, date, time, and location. Patient understands to call if he does not receive the letter  in a timely manner. Patient agrees with treatment and thanked me for call.

## 2020-06-06 ENCOUNTER — Ambulatory Visit (INDEPENDENT_AMBULATORY_CARE_PROVIDER_SITE_OTHER): Payer: 59 | Admitting: Pulmonary Disease

## 2020-06-06 ENCOUNTER — Encounter: Payer: Self-pay | Admitting: Pulmonary Disease

## 2020-06-06 ENCOUNTER — Other Ambulatory Visit: Payer: Self-pay

## 2020-06-06 DIAGNOSIS — G4733 Obstructive sleep apnea (adult) (pediatric): Secondary | ICD-10-CM | POA: Diagnosis not present

## 2020-06-06 NOTE — Assessment & Plan Note (Addendum)
He has severe OSA  The pathophysiology of obstructive sleep apnea , it's cardiovascular consequences & modes of treatment including CPAP were discused with the patient in detail & they evidenced understanding. CPAP titration study has already been scheduled.  Based on this we will get him started on CPAP machine with appropriate mask interface. Desaturations are severe and unclear if COPD plays a role, will ensure that this is corrected on CPAP  Weight loss encouraged, compliance with goal of at least 4-6 hrs every night is the expectation. 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

## 2020-06-06 NOTE — Patient Instructions (Signed)
CPAP titration study has been scheduled. Based on this we will get you started on a CPAP machine Usage of at least 4 to 6 hours would be expected to clear DOT physical

## 2020-06-06 NOTE — Progress Notes (Signed)
Subjective:    Patient ID: James Parrish, male    DOB: 04-20-1966, 54 y.o.   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 He went for his DOT physical and based on his BMI and neck circumference was told that he required a sleep study. He underwent home sleep test which showed severe OSA and presents for further evaluation. Loud snoring has been noted by his wife of 30 years and apneas have been witnessed. He is out of work due to left shoulder surgery and will not be going back to work for at least a few months.  Right shoulder surgery is also planned in the future. Epworth sleepiness score is 8  During his workday, bedtime is around 7 PM and he wakes up around 3 AM.  Now that he is not working, bedtime is between 9:52 PM, sleep latency is 30 minutes to an hour, he sleeps on his side with 3 pillows, reports 1-2 nocturnal awakenings, out of bed around 7 AM feeling tired and exhausted with dryness of mouth but denies headaches.  He has gained 15 pounds in the last 2 years.  There is no history suggestive of cataplexy, sleep paralysis or parasomnias  PMH -diabetes, insulin requiring, hypertension requiring 4 medications Half pack per day smoker, 20 pack years.    Significant tests/ events reviewed  HST 04/2020 severe OSA, AHI 79/hour, lowest desaturation 77%   Past Medical History:  Diagnosis Date  . Complication of anesthesia    hard to wake up   . Diabetes mellitus without complication ([place])   . ED (erectile dysfunction)   . History of COVID-19 04/2019  . Hyperlipidemia   . Hypertension     Past Surgical History:  Procedure Laterality Date  . COLONOSCOPY    . finger surgery Right    Index  . KNEE SURGERY Left   . KNEE SURGERY Right   . ORIF PATELLA Right   . SHOULDER ARTHROSCOPY WITH ROTATOR CUFF REPAIR Left 02/04/2020   Procedure: SHOULDER ARTHROSCOPY WITH ROTATOR CUFF REPAIR WITH BICEP TENODESIS;  Surgeon:  Hiram Gash, MD;  Location: [facility];  Service: Orthopedics;  Laterality: Left;    No Known Allergies  Social History   Socioeconomic History  . Marital status: Married    Spouse name: Not on file  . Number of children: Not on file  . Years of education: Not on file  . Highest education level: Not on file  Occupational History  . Not on file  Tobacco Use  . Smoking status: Former Smoker    Packs/day: 1.00    Years: 30.00    Pack years: 30.00    Types: Cigarettes    Quit date: 12/25/2019    Years since quitting: 0.4  . Smokeless tobacco: Never Used  Vaping Use  . Vaping Use: Never used  Substance and Sexual Activity  . Alcohol use: Not Currently    Comment: occasionally  . Drug use: Never  . Sexual activity: Not on file  Other Topics Concern  . Not on file  Social History Narrative  . Not on file   Social Determinants of Health   Financial Resource Strain:   . Difficulty of Paying Living Expenses: Not on file  Food Insecurity:   . Worried About Charity fundraiser in the Last Year: Not on file  . Ran Out of Food in the Last Year: Not on file  Transportation Needs:   .  Lack of Transportation (Medical): Not on file  . Lack of Transportation (Non-Medical): Not on file  Physical Activity:   . Days of Exercise per Week: Not on file  . Minutes of Exercise per Session: Not on file  Stress:   . Feeling of Stress : Not on file  Social Connections:   . Frequency of Communication with Friends and Family: Not on file  . Frequency of Social Gatherings with Friends and Family: Not on file  . Attends Religious Services: Not on file  . Active Member of Clubs or Organizations: Not on file  . Attends Archivist Meetings: Not on file  . Marital Status: Not on file  Intimate Partner Violence:   . Fear of Current or Ex-Partner: Not on file  . Emotionally Abused: Not on file  . Physically Abused: Not on file  . Sexually Abused: Not on file     Family History  Problem Relation Age of Onset  . Cancer Mother   . Hypertension Mother   . Diabetes Mother   . Venous thrombosis Son       Review of Systems   Constitutional: negative for anorexia, fevers and sweats  Eyes: negative for irritation, redness and visual disturbance  Ears, nose, mouth, throat, and face: negative for earaches, epistaxis, nasal congestion and sore throat  Respiratory: negative for cough, dyspnea on exertion, sputum and wheezing  Cardiovascular: negative for chest pain, dyspnea, lower extremity edema, orthopnea, palpitations and syncope  Gastrointestinal: negative for abdominal pain, constipation, diarrhea, melena, nausea and vomiting  Genitourinary:negative for dysuria, frequency and hematuria  Hematologic/lymphatic: negative for bleeding, easy bruising and lymphadenopathy  Musculoskeletal:negative for arthralgias, muscle weakness and stiff joints  Neurological: negative for coordination problems, gait problems, headaches and weakness  Endocrine: negative for diabetic symptoms including polydipsia, polyuria and weight loss     Objective:   Physical Exam  Gen. Pleasant, obese, in no distress, normal affect ENT - no pallor,icterus, no post nasal drip, class 2-3 airway Neck: No JVD, no thyromegaly, no carotid bruits Lungs: no use of accessory muscles, no dullness to percussion, decreased without rales or rhonchi  Cardiovascular: Rhythm regular, heart sounds  normal, no murmurs or gallops, no peripheral edema Abdomen: soft and non-tender, no hepatosplenomegaly, BS normal. Musculoskeletal: No deformities, no cyanosis or clubbing Neuro:  alert, non focal, no tremors       Assessment & Plan:

## 2020-06-06 NOTE — Addendum Note (Signed)
Addended by: Satira Sark D on: 06/06/2020 10:29 AM   Modules accepted: Orders

## 2020-06-07 ENCOUNTER — Other Ambulatory Visit: Payer: Self-pay

## 2020-06-07 MED ORDER — OZEMPIC (0.25 OR 0.5 MG/DOSE) 2 MG/1.5ML ~~LOC~~ SOPN: 0.5000 mg | PEN_INJECTOR | SUBCUTANEOUS | 0 refills | Status: DC

## 2020-06-28 ENCOUNTER — Ambulatory Visit (HOSPITAL_BASED_OUTPATIENT_CLINIC_OR_DEPARTMENT_OTHER): Payer: 59 | Attending: Cardiology | Admitting: Cardiology

## 2020-06-28 ENCOUNTER — Other Ambulatory Visit: Payer: Self-pay

## 2020-06-28 DIAGNOSIS — G4733 Obstructive sleep apnea (adult) (pediatric): Secondary | ICD-10-CM | POA: Diagnosis present

## 2020-06-28 DIAGNOSIS — E119 Type 2 diabetes mellitus without complications: Secondary | ICD-10-CM | POA: Insufficient documentation

## 2020-06-28 DIAGNOSIS — Z794 Long term (current) use of insulin: Secondary | ICD-10-CM | POA: Diagnosis not present

## 2020-06-30 NOTE — Procedures (Signed)
   Patient Name: James Parrish, James Parrish Date: 06/28/2020 Gender: Male D.O.B: Nov 11, 1965 Age (years): 54 Referring Provider: Sueanne Margarita DO Height (inches): 74 Interpreting Physician: Fransico Him MD, ABSM Weight (lbs): 269 RPSGT: Zadie Rhine BMI: 35 MRN: [id number] Neck Size: 19.00  CLINICAL INFORMATION The patient is referred for a CPAP titration to treat sleep apnea.  SLEEP STUDY TECHNIQUE As per the AASM Manual for the Scoring of Sleep and Associated Events v2.3 (April 2016) with a hypopnea requiring 4% desaturations.  The channels recorded and monitored were frontal, central and occipital EEG, electrooculogram (EOG), submentalis EMG (chin), nasal and oral airflow, thoracic and abdominal wall motion, anterior tibialis EMG, snore microphone, electrocardiogram, and pulse oximetry. Continuous positive airway pressure (CPAP) was initiated at the beginning of the study and titrated to treat sleep-disordered breathing.  MEDICATIONS Medications self-administered by patient taken the night of the study : NORVASC, METFORMIN, LOPRESSOR  TECHNICIAN COMMENTS Comments added by technician: NO REST ROOM VISTED. Patient was restless all through the night. Patient requested to end study early due to discomfort with equipment. Comments added by scorer: N/A  RESPIRATORY PARAMETERS Optimal PAP Pressure (cm):16  AHI at Optimal Pressure (/hr):0.0 Overall Minimal O2 (%):86.0  Supine % at Optimal Pressure (%):0 Minimal O2 at Optimal Pressure (%): 89.0   SLEEP ARCHITECTURE The study was initiated at 10:19:08 PM and ended at 4:17:04 AM.  Sleep onset time was 30.9 minutes and the sleep efficiency was 49.0%. The total sleep time was 175.5 minutes.  The patient spent 23.1% of the night in stage N1 sleep, 62.7% in stage N2 sleep, 0.0% in stage N3 and 14.3% in REM.Stage REM latency was 124.0 minutes  Wake after sleep onset was 151.5. Alpha intrusion was absent. Supine sleep was 45.57%.  CARDIAC  DATA The 2 lead EKG demonstrated sinus rhythm. The mean heart rate was 77.4 beats per minute. Other EKG findings include: None.  LEG MOVEMENT DATA The total Periodic Limb Movements of Sleep (PLMS) were 0. The PLMS index was 0.0. A PLMS index of <15 is considered normal in adults.  IMPRESSIONS - The optimal PAP pressure was 16 cm of water. - Central sleep apnea was not noted during this titration (CAI = 0.3/h). - Moderate oxygen desaturations were observed during this titration (min O2 = 86.0%). - No snoring was audible during this study. - No cardiac abnormalities were observed during this study. - Clinically significant periodic limb movements were not noted during this study. Arousals associated with PLMs were rare.  DIAGNOSIS - Obstructive Sleep Apnea (G47.33)  RECOMMENDATIONS - Trial of CPAP therapy on 16 cm H2O with a Large size Resmed Full Face Mask AirFit F20 mask and heated humidification. - Avoid alcohol, sedatives and other CNS depressants that may worsen sleep apnea and disrupt normal sleep architecture. - Sleep hygiene should be reviewed to assess factors that may improve sleep quality. - Weight management and regular exercise should be initiated or continued. - Return to Sleep Center for re-evaluation after 8 weeks of therapy  [Electronically signed] 06/30/2020 05:02 PM  Fransico Him MD, ABSM Diplomate, American Board of Sleep Medicine

## 2020-07-04 ENCOUNTER — Telehealth: Payer: Self-pay | Admitting: *Deleted

## 2020-07-04 NOTE — Telephone Encounter (Signed)
Informed patient of sleep study results and patient understanding was verbalized. Patient understands his sleep study showed they had a successful PAP titration and let DME know that orders are in EPIC. Please set up 8 week OV with me.  Pt is aware and agreeable to her results.  Upon patient request DME selection is [facility]. Patient understands HE will be contacted by [facility] to set up his cpap. Patient understands to call if [facility] does not contact him with new setup in a timely manner. Patient understands they will be called once confirmation has been received from [facility] that they have received their new machine to schedule 10 week follow up appointment.  [facility] notified of new cpap order  Please add to airview Patient was grateful for the call and thanked me.

## 2020-07-04 NOTE — Telephone Encounter (Signed)
-----   Message from Sueanne Margarita, MD sent at 06/30/2020  5:05 PM EDT ----- Please let patient know that they had a successful PAP titration and let DME know that orders are in EPIC.  Please set up 8 week OV with me.

## 2020-07-19 ENCOUNTER — Other Ambulatory Visit: Payer: Self-pay

## 2020-07-19 ENCOUNTER — Ambulatory Visit (AMBULATORY_SURGERY_CENTER): Payer: Self-pay | Admitting: *Deleted

## 2020-07-19 VITALS — Ht 74.0 in | Wt 267.0 lb

## 2020-07-19 DIAGNOSIS — Z86010 Personal history of colonic polyps: Secondary | ICD-10-CM

## 2020-07-19 NOTE — Progress Notes (Signed)

## 2020-07-22 NOTE — Telephone Encounter (Signed)
Patient has a 10 week follow up appointment scheduled for 09/06/20. Patient understands he needs to keep this appointment for insurance compliance. Patient was grateful for the call and thanked me.

## 2020-08-02 ENCOUNTER — Other Ambulatory Visit: Payer: Self-pay

## 2020-08-02 ENCOUNTER — Encounter: Payer: Self-pay | Admitting: Gastroenterology

## 2020-08-02 ENCOUNTER — Ambulatory Visit (AMBULATORY_SURGERY_CENTER): Payer: 59 | Admitting: Gastroenterology

## 2020-08-02 VITALS — BP 128/95 | HR 72 | Temp 97.3°F | Resp 16 | Ht 74.0 in | Wt 267.0 lb

## 2020-08-02 DIAGNOSIS — Z86010 Personal history of colonic polyps: Secondary | ICD-10-CM | POA: Diagnosis present

## 2020-08-02 DIAGNOSIS — D124 Benign neoplasm of descending colon: Secondary | ICD-10-CM | POA: Diagnosis not present

## 2020-08-02 MED ORDER — SODIUM CHLORIDE 0.9 % IV SOLN: 500.0000 mL | Freq: Once | INTRAVENOUS | Status: DC

## 2020-08-02 NOTE — Patient Instructions (Signed)
Handouts provided on polyps, diverticulosis, hemorrhoids and high-fiber diet.   Recommend a High-fiber diet.  Use FiberCon 1-2 tablets by mouth daily to keep your stools soft and easy to pass to prevent complications from the diverticulosis and hemorrhoids.   YOU HAD AN ENDOSCOPIC PROCEDURE TODAY AT [facility]:   Refer to the procedure report that was given to you for any specific questions about what was found during the examination.  If the procedure report does not answer your questions, please call your gastroenterologist to clarify.  If you requested that your care partner not be given the details of your procedure findings, then the procedure report has been included in a sealed envelope for you to review at your convenience later.  YOU SHOULD EXPECT: Some feelings of bloating in the abdomen. Passage of more gas than usual.  Walking can help get rid of the air that was put into your GI tract during the procedure and reduce the bloating. If you had a lower endoscopy (such as a colonoscopy or flexible sigmoidoscopy) you may notice spotting of blood in your stool or on the toilet paper. If you underwent a bowel prep for your procedure, you may not have a normal bowel movement for a few days.  Please Note:  You might notice some irritation and congestion in your nose or some drainage.  This is from the oxygen used during your procedure.  There is no need for concern and it should clear up in a day or so.  SYMPTOMS TO REPORT IMMEDIATELY:   Following lower endoscopy (colonoscopy or flexible sigmoidoscopy):  Excessive amounts of blood in the stool  Significant tenderness or worsening of abdominal pains  Swelling of the abdomen that is new, acute  Fever of 100F or higher  For urgent or emergent issues, a gastroenterologist can be reached at any hour by calling [phone]. Do not use MyChart messaging for urgent concerns.    DIET:  We do recommend a small meal at first,  but then you may proceed to your regular diet.  Drink plenty of fluids but you should avoid alcoholic beverages for 24 hours.  ACTIVITY:  You should plan to take it easy for the rest of today and you should NOT DRIVE or use heavy machinery until tomorrow (because of the sedation medicines used during the test).    FOLLOW UP: Our staff will call the number listed on your records 48-72 hours following your procedure to check on you and address any questions or concerns that you may have regarding the information given to you following your procedure. If we do not reach you, we will leave a message.  We will attempt to reach you two times.  During this call, we will ask if you have developed any symptoms of COVID 19. If you develop any symptoms (ie: fever, flu-like symptoms, shortness of breath, cough etc.) before then, please call [phone].  If you test positive for Covid 19 in the 2 weeks post procedure, please call and report this information to [place].    If any biopsies were taken you will be contacted by phone or by letter within the next 1-3 weeks.  Please call us at [phone] if you have not heard about the biopsies in 3 weeks.    SIGNATURES/CONFIDENTIALITY: You and/or your care partner have signed paperwork which will be entered into your electronic medical record.  These signatures attest to the fact that that the information above on your After Visit  Summary has been reviewed and is understood.  Full responsibility of the confidentiality of this discharge information lies with you and/or your care-partner.

## 2020-08-02 NOTE — Progress Notes (Signed)
To PACU, VSS. Report to [email or web address] 

## 2020-08-02 NOTE — Progress Notes (Signed)
Vital signs checked by:SP  The patient states no changes in medical or surgical history since pre-visit screening on 07/19/20.

## 2020-08-02 NOTE — Progress Notes (Signed)
Called to room to assist during endoscopic procedure.  Patient ID and intended procedure confirmed with present staff. Received instructions for my participation in the procedure from the performing physician.  

## 2020-08-02 NOTE — Op Note (Signed)
[facility] Patient Name: James Parrish Procedure Date: 08/02/2020 10:40 AM MRN: [id number] Endoscopist: Justice Britain , MD Age: 54 Referring MD:  Date of Birth: [birth year] Gender: Male Account #: [id number] Procedure:                Colonoscopy Indications:              Surveillance: Personal history of adenomatous                            polyps on last colonoscopy 5 years ago Medicines:                Monitored Anesthesia Care Procedure:                Pre-Anesthesia Assessment:                           - Prior to the procedure, a History and Physical                            was performed, and patient medications and                            allergies were reviewed. The patient's tolerance of                            previous anesthesia was also reviewed. The risks                            and benefits of the procedure and the sedation                            options and risks were discussed with the patient.                            All questions were answered, and informed consent                            was obtained. Prior Anticoagulants: The patient has                            taken no previous anticoagulant or antiplatelet                            agents except for aspirin. ASA Grade Assessment:                            III - A patient with severe systemic disease. After                            reviewing the risks and benefits, the patient was                            deemed in satisfactory condition to undergo the  procedure.                           After obtaining informed consent, the colonoscope                            was passed under direct vision. Throughout the                            procedure, the patient's blood pressure, pulse, and                            oxygen saturations were monitored continuously. The                            Colonoscope was introduced through the anus and                             advanced to the the cecum, identified by                            appendiceal orifice and ileocecal valve. The                            colonoscopy was somewhat difficult due to                            significant looping. Successful completion of the                            procedure was aided by changing the patient's                            position, using manual pressure, withdrawing and                            reinserting the scope, straightening and shortening                            the scope to obtain bowel loop reduction and using                            scope torsion. The quality of the bowel preparation                            was adequate. The ileocecal valve, appendiceal                            orifice, and rectum were photographed. Scope In: 10:51:34 AM Scope Out: 11:11:31 AM Scope Withdrawal Time: 0 hours 12 minutes 54 seconds  Total Procedure Duration: 0 hours 19 minutes 57 seconds  Findings:                 The digital rectal exam findings include  hemorrhoids. Pertinent negatives include no                            palpable rectal lesions.                           Extensive amounts of liquid semi-liquid stool was                            found in the entire colon, interfering with                            visualization. Lavage of the area was performed                            using copious amounts, resulting in clearance with                            adequate visualization.                           A 4 mm polyp was found in the descending colon. The                            polyp was sessile. The polyp was removed with a                            cold snare. Resection and retrieval were complete.                           A few small-mouthed diverticula were found in the                            sigmoid colon and descending colon.                           Normal mucosa was  found in the entire colon                            otherwise.                           Non-bleeding non-thrombosed internal hemorrhoids                            were found during retroflexion, during perianal                            exam and during digital exam. The hemorrhoids were                            Grade II (internal hemorrhoids that prolapse but                            reduce spontaneously). Complications:  No immediate complications. Estimated Blood Loss:     Estimated blood loss was minimal. Impression:               - Hemorrhoids found on digital rectal exam.                           - Stool in the entire examined colon.                           - One 4 mm polyp in the descending colon, removed                            with a cold snare. Resected and retrieved.                           - Diverticulosis in the sigmoid colon and in the                            descending colon.                           - Normal mucosa in the entire examined colon                            otherwise.                           - Non-bleeding non-thrombosed internal hemorrhoids. Recommendation:           - The patient will be observed post-procedure,                            until all discharge criteria are met.                           - Discharge patient to home.                           - Patient has a contact number available for                            emergencies. The signs and symptoms of potential                            delayed complications were discussed with the                            patient. Return to normal activities tomorrow.                            Written discharge instructions were provided to the                            patient.                           -  High fiber diet.                           - Use FiberCon 1-2 tablets PO daily.                           - Continue present medications.                           -  Await pathology results.                           - Repeat colonoscopy in 02/18/09 years for                            surveillance based on pathology results and                            findings of adenomatous/serrated tissue.                           - The findings and recommendations were discussed                            with the patient. Justice Britain, MD 08/02/2020 11:18:48 AM

## 2020-08-04 ENCOUNTER — Telehealth: Payer: Self-pay

## 2020-08-04 NOTE — Telephone Encounter (Signed)
  Follow up Call-  Call back number 08/02/2020  Post procedure Call Back phone  # [phone]  Permission to leave phone message Yes     Patient questions:  Do you have a fever, pain , or abdominal swelling? No. Pain Score  0 *  Have you tolerated food without any problems? Yes.    Have you been able to return to your normal activities? Yes.    Do you have any questions about your discharge instructions: Diet   No. Medications  No. Follow up visit  No.  Do you have questions or concerns about your Care? No.  Actions: * If pain score is 4 or above: No action needed, pain <4.  1. Have you developed a fever since your procedure? no  2.   Have you had an respiratory symptoms (SOB or cough) since your procedure? no  3.   Have you tested positive for COVID 19 since your procedure no  4.   Have you had any family members/close contacts diagnosed with the COVID 19 since your procedure?  no   If yes to any of these questions please route to Joylene John, RN and Joella Prince, RN

## 2020-08-05 ENCOUNTER — Encounter: Payer: Self-pay | Admitting: Gastroenterology

## 2020-08-08 ENCOUNTER — Ambulatory Visit: Payer: 59 | Admitting: Pulmonary Disease

## 2020-08-11 ENCOUNTER — Other Ambulatory Visit: Payer: Self-pay

## 2020-08-11 ENCOUNTER — Encounter: Payer: Self-pay | Admitting: Pulmonary Disease

## 2020-08-11 ENCOUNTER — Ambulatory Visit (INDEPENDENT_AMBULATORY_CARE_PROVIDER_SITE_OTHER): Payer: 59 | Admitting: Pulmonary Disease

## 2020-08-11 VITALS — BP 122/82 | HR 82 | Temp 97.8°F | Ht 74.0 in | Wt 277.4 lb

## 2020-08-11 DIAGNOSIS — G4733 Obstructive sleep apnea (adult) (pediatric): Secondary | ICD-10-CM

## 2020-08-11 DIAGNOSIS — Z0000 Encounter for general adult medical examination without abnormal findings: Secondary | ICD-10-CM | POA: Insufficient documentation

## 2020-08-11 DIAGNOSIS — Z87891 Personal history of nicotine dependence: Secondary | ICD-10-CM

## 2020-08-11 DIAGNOSIS — Z23 Encounter for immunization: Secondary | ICD-10-CM

## 2020-08-11 NOTE — Patient Instructions (Addendum)
You were seen today by Lauraine Rinne, NP  for:   1. OSA (obstructive sleep apnea)  We have contacted your DME company to obtain a compliance report  We will also send an order to DME company to have them follow-up with you regarding how to set the humidity on your machine  We recommend that you continue using your CPAP daily >>>Keep up the hard work using your device >>> Goal should be wearing this for the entire night that you are sleeping, at least 4 to 6 hours  Remember:  . Do not drive or operate heavy machinery if tired or drowsy.  . Please notify the supply company and office if you are unable to use your device regularly due to missing supplies or machine being broken.  . Work on maintaining a healthy weight and following your recommended nutrition plan  . Maintain proper daily exercise and movement  . Maintaining proper use of your device can also help improve management of other chronic illnesses such as: Blood pressure, blood sugars, and weight management.   BiPAP/ CPAP Cleaning:  >>>Clean weekly, with Dawn soap, and bottle brush.  Set up to air dry. >>> Wipe mask out daily with wet wipe or towelette  2. Former smoker  - Ambulatory Referral for [employer]  We will refer you today to our lung cancer screening program >>>This is based off of your 30 pack-year smoking history >>> This is a recommendation from the [place] preventative services task force (USPSTF) >>>The USPSTF recommends annual screening for lung cancer with low-dose computed tomography (LDCT) in adults aged 63 to 80 years who have a 20 pack-year smoking history and currently smoke or have quit within the past 15 years. Screening should be discontinued once a person has not smoked for 15 years or develops a health problem that substantially limits life expectancy or the ability or willingness to have curative lung surgery.   Our office will call you and set up an appointment with Eric Form (Nurse Practitioner)  who leads this program.  This appointment takes place in our office.  After completing this meeting with Eric Form NP you will get a low-dose CT as the screening >>>We will call you with those results    3. Healthcare maintenance  Flu vaccine today    We recommend today:  Orders Placed This Encounter  Procedures  . Flu Vaccine QUAD 36+ mos IM (Fluarix, Quad PF)  . Ambulatory Referral for Lung Cancer Scre    Referral Priority:   Routine    Referral Type:   Consultation    Referral Reason:   Specialty Services Required    Number of Visits Requested:   1   Orders Placed This Encounter  Procedures  . Flu Vaccine QUAD 36+ mos IM (Fluarix, Quad PF)  . Ambulatory Referral for Lung Cancer Scre   No orders of the defined types were placed in this encounter.   Follow Up:    Return in about 3 months (around 11/11/2020), or if symptoms worsen or fail to improve, for Follow up with Dr. Elsworth Soho.   Notification of test results are managed in the following manner: If there are  any recommendations or changes to the  plan of care discussed in office today,  we will contact you and let you know what they are. If you do not hear from [place], then your results are normal and you can view them through your  MyChart account , or a letter will  be sent to you. Thank you again for trusting [place] with your care  - Thank you, [facility]    It is flu season:   >>> Best ways to protect herself from the flu: Receive the yearly flu vaccine, practice good hand hygiene washing with soap and also using hand sanitizer when available, eat a nutritious meals, get adequate rest, hydrate appropriately       Please contact the office if your symptoms worsen or you have concerns that you are not improving.   Thank you for choosing [facility] for your healthcare, and for allowing [place] to partner with you on your healthcare journey. I am thankful to be able to provide care to you today.   Wyn Quaker  FNP-C    Sleep Apnea Sleep apnea affects breathing during sleep. It causes breathing to stop for a short time or to become shallow. It can also increase the risk of:  Heart attack.  Stroke.  Being very overweight (obese).  Diabetes.  Heart failure.  Irregular heartbeat. The goal of treatment is to help you breathe normally again. What are the causes? There are three kinds of sleep apnea:  Obstructive sleep apnea. This is caused by a blocked or collapsed airway.  Central sleep apnea. This happens when the brain does not send the right signals to the muscles that control breathing.  Mixed sleep apnea. This is a combination of obstructive and central sleep apnea. The most common cause of this condition is a collapsed or blocked airway. This can happen if:  Your throat muscles are too relaxed.  Your tongue and tonsils are too large.  You are overweight.  Your airway is too small. What increases the risk?  Being overweight.  Smoking.  Having a small airway.  Being older.  Being male.  Drinking alcohol.  Taking medicines to calm yourself (sedatives or tranquilizers).  Having family members with the condition. What are the signs or symptoms?  Trouble staying asleep.  Being sleepy or tired during the day.  Getting angry a lot.  Loud snoring.  Headaches in the morning.  Not being able to focus your mind (concentrate).  Forgetting things.  Less interest in sex.  Mood swings.  Personality changes.  Feelings of sadness (depression).  Waking up a lot during the night to pee (urinate).  Dry mouth.  Sore throat. How is this diagnosed?  Your medical history.  A physical exam.  A test that is done when you are sleeping (sleep study). The test is most often done in a sleep lab but may also be done at home. How is this treated?   Sleeping on your side.  Using a medicine to get rid of mucus in your nose (decongestant).  Avoiding the use of  alcohol, medicines to help you relax, or certain pain medicines (narcotics).  Losing weight, if needed.  Changing your diet.  Not smoking.  Using a machine to open your airway while you sleep, such as: ? An oral appliance. This is a mouthpiece that shifts your lower jaw forward. ? A CPAP device. This device blows air through a mask when you breathe out (exhale). ? An EPAP device. This has valves that you put in each nostril. ? A BPAP device. This device blows air through a mask when you breathe in (inhale) and breathe out.  Having surgery if other treatments do not work. It is important to get treatment for sleep apnea. Without treatment, it can lead to:  High  blood pressure.  Coronary artery disease.  In men, not being able to have an erection (impotence).  Reduced thinking ability. Follow these instructions at home: Lifestyle  Make changes that your doctor recommends.  Eat a healthy diet.  Lose weight if needed.  Avoid alcohol, medicines to help you relax, and some pain medicines.  Do not use any products that contain nicotine or tobacco, such as cigarettes, e-cigarettes, and chewing tobacco. If you need help quitting, ask your doctor. General instructions  Take over-the-counter and prescription medicines only as told by your doctor.  If you were given a machine to use while you sleep, use it only as told by your doctor.  If you are having surgery, make sure to tell your doctor you have sleep apnea. You may need to bring your device with you.  Keep all follow-up visits as told by your doctor. This is important. Contact a doctor if:  The machine that you were given to use during sleep bothers you or does not seem to be working.  You do not get better.  You get worse. Get help right away if:  Your chest hurts.  You have trouble breathing in enough air.  You have an uncomfortable feeling in your back, arms, or stomach.  You have trouble talking.  One side  of your body feels weak.  A part of your face is hanging down. These symptoms may be an emergency. Do not wait to see if the symptoms will go away. Get medical help right away. Call your local emergency services (911 in the [place] Do not drive yourself to the hospital. Summary  This condition affects breathing during sleep.  The most common cause is a collapsed or blocked airway.  The goal of treatment is to help you breathe normally while you sleep. This information is not intended to replace advice given to you by your health care provider. Make sure you discuss any questions you have with your health care provider. Document Revised: 07/18/2018 Document Reviewed: 05/27/2018 Elsevier Patient Education  [place].   Influenza Virus Vaccine injection What is this medicine? INFLUENZA VIRUS VACCINE (in floo EN zuh VAHY ruhs vak SEEN) helps to reduce the risk of getting influenza also known as the flu. The vaccine only helps protect you against some strains of the flu. This medicine may be used for other purposes; ask your health care provider or pharmacist if you have questions. COMMON BRAND NAME(S): Afluria, Afluria Quadrivalent, Agriflu, Alfuria, FLUAD, Fluarix, Fluarix Quadrivalent, Flublok, Flublok Quadrivalent, FLUCELVAX, FLUCELVAX Quadrivalent, Flulaval, Flulaval Quadrivalent, Fluvirin, Fluzone, Fluzone High-Dose, Fluzone Intradermal, Fluzone Quadrivalent What should I tell my health care provider before I take this medicine? They need to know if you have any of these conditions:  bleeding disorder like hemophilia  fever or infection  Guillain-Barre syndrome or other neurological problems  immune system problems  infection with the human immunodeficiency virus (HIV) or AIDS  low blood platelet counts  multiple sclerosis  an unusual or allergic reaction to influenza virus vaccine, latex, other medicines, foods, dyes, or preservatives. Different brands of vaccines  contain different allergens. Some may contain latex or eggs. Talk to your doctor about your allergies to make sure that you get the right vaccine.  pregnant or trying to get pregnant  breast-feeding How should I use this medicine? This vaccine is for injection into a muscle or under the skin. It is given by a health care professional. A copy of Vaccine Information Statements will be given  before each vaccination. Read this sheet carefully each time. The sheet may change frequently. Talk to your healthcare provider to see which vaccines are right for you. Some vaccines should not be used in all age groups. Overdosage: If you think you have taken too much of this medicine contact a poison control center or emergency room at once. NOTE: This medicine is only for you. Do not share this medicine with others. What if I miss a dose? This does not apply. What may interact with this medicine?  chemotherapy or radiation therapy  medicines that lower your immune system like etanercept, anakinra, infliximab, and adalimumab  medicines that treat or prevent blood clots like warfarin  phenytoin  steroid medicines like prednisone or cortisone  theophylline  vaccines This list may not describe all possible interactions. Give your health care provider a list of all the medicines, herbs, non-prescription drugs, or dietary supplements you use. Also tell them if you smoke, drink alcohol, or use illegal drugs. Some items may interact with your medicine. What should I watch for while using this medicine? Report any side effects that do not go away within 3 days to your doctor or health care professional. Call your health care provider if any unusual symptoms occur within 6 weeks of receiving this vaccine. You may still catch the flu, but the illness is not usually as bad. You cannot get the flu from the vaccine. The vaccine will not protect against colds or other illnesses that may cause fever. The vaccine  is needed every year. What side effects may I notice from receiving this medicine? Side effects that you should report to your doctor or health care professional as soon as possible:  allergic reactions like skin rash, itching or hives, swelling of the face, lips, or tongue Side effects that usually do not require medical attention (report to your doctor or health care professional if they continue or are bothersome):  fever  headache  muscle aches and pains  pain, tenderness, redness, or swelling at the injection site  tiredness This list may not describe all possible side effects. Call your doctor for medical advice about side effects. You may report side effects to FDA at 1-800-FDA-1088. Where should I keep my medicine? The vaccine will be given by a health care professional in a clinic, pharmacy, doctor's office, or other health care setting. You will not be given vaccine doses to store at home. NOTE: This sheet is a summary. It may not cover all possible information. If you have questions about this medicine, talk to your doctor, pharmacist, or health care provider.  2020 Elsevier/Gold Standard (2018-08-26 08:45:43)

## 2020-08-11 NOTE — Assessment & Plan Note (Signed)
History of known severe obstructive sleep apnea No CPAP compliance report on file today from DME Reviewed CPAP compliance on patient's phone which showed adequately controlled AHI less than 1  Plan: Continue CPAP therapy Will follow up with patient if any abnormalities are seen on CPAP compliance report

## 2020-08-11 NOTE — Progress Notes (Signed)
@Patient  ID: James Parrish, male    DOB: 12/18/1965, 54 y.o.   MRN: [id number]  Chief Complaint  Patient presents with  . Follow-up    OSA, using CPAP with no issues    Referring provider: Sueanne Margarita, DO  HPI:  54 year old former smoker followed in our office for obstructive sleep apnea  PMH: Type 2 diabetes Smoker/ Smoking History: Former Smoker. March / 2021. 30 pack year smoker.  Maintenance: None Pt of: Dr. Elsworth Soho   08/11/2020  - Visit   54 year old male former smoker followed in our office for obstructive sleep apnea.  He is established with Dr. Elsworth Soho.  Patient was last seen in August/2021.  Plan of care at that office visit was for the patient to obtain a CPAP titration study.  The CPAP titration study was read by Dr. Radford Pax.  Cardiology/Dr. Radford Pax put in new CPAP orders.  Patient is not in [place].  We have contacted the DME company to get [employer] updated.  I have reviewed patient's compliance on his phone.  It shows good compliance as well as a adequately controlled AHI.  Patient recently had left shoulder surgery is currently working with physical therapy.  They are planning on doing a right shoulder surgery when he recovers from his left.  Patient has not yet received his seasonal flu vaccine.  Patient has not in the lung cancer screening program.  We will discuss both of these today.  He has no respiratory complaints today. Questionaires / Pulmonary Flowsheets:   ACT:  No flowsheet data found.  MMRC: No flowsheet data found.  Epworth:  Results of the Epworth flowsheet 06/06/2020  Sitting and reading 3  Watching TV 1  Sitting, inactive in a public place (e.g. a theatre or a meeting) 0  As a passenger in a car for an hour without a break 0  Lying down to rest in the afternoon when circumstances permit 3  Sitting and talking to someone 0  Sitting quietly after a lunch without alcohol 0  In a car, while stopped for a few minutes in traffic 0  Total score 7     Tests:   FENO:  No results found for: NITRICOXIDE  PFT: No flowsheet data found.  WALK:  No flowsheet data found.  Imaging: No results found.  Lab Results:  CBC    Component Value Date/Time   WBC 9.6 01/25/2020 1147   RBC 6.26 (H) 01/25/2020 1147   HGB 15.6 01/25/2020 1147   HCT 49.5 01/25/2020 1147   PLT 241 01/25/2020 1147   MCV 79.1 (L) 01/25/2020 1147   MCH 24.9 (L) 01/25/2020 1147   MCHC 31.5 01/25/2020 1147   RDW 16.0 (H) 01/25/2020 1147    BMET    Component Value Date/Time   NA 138 01/25/2020 1147   K 4.4 01/25/2020 1147   CL 104 01/25/2020 1147   CO2 24 01/25/2020 1147   GLUCOSE 137 (H) 01/25/2020 1147   BUN 19 01/25/2020 1147   CREATININE 0.87 01/25/2020 1147   CALCIUM 9.1 01/25/2020 1147   GFRNONAA >60 01/25/2020 1147   GFRAA >60 01/25/2020 1147    BNP No results found for: BNP  ProBNP No results found for: PROBNP  Specialty Problems      Pulmonary Problems   OSA (obstructive sleep apnea)      No Known Allergies  Immunization History  Administered Date(s) Administered  . Influenza, Seasonal, Injecte, Preservative Fre 08/15/2012, 08/26/2013  . Influenza,inj,Quad PF,6+ Mos 08/07/2017, 08/11/2020  .  Influenza,inj,quad, With Preservative 09/29/2014, 08/10/2015  . PFIZER SARS-COV-2 Vaccination 02/13/2020, 03/07/2020  . Tdap 10/16/2007, 11/24/2015    Past Medical History:  Diagnosis Date  . Complication of anesthesia    hard to wake up   . Diabetes mellitus without complication ([place])   . ED (erectile dysfunction)   . History of COVID-19 04/2019  . Hyperlipidemia   . Hypertension   . Sleep apnea     Tobacco History: Social History   Tobacco Use  Smoking Status Former Smoker  . Packs/day: 1.00  . Years: 30.00  . Pack years: 30.00  . Types: Cigarettes  . Quit date: 12/25/2019  . Years since quitting: 0.6  Smokeless Tobacco Never Used   Counseling given: Not Answered   Continue to not smoke  Outpatient Encounter  Medications as of 08/11/2020  Medication Sig  . amLODipine (NORVASC) 10 MG tablet Take 10 mg by mouth at bedtime.  [name] Kitchen aspirin 81 MG EC tablet Take 81 mg by mouth daily.  [name] Kitchen atorvastatin (LIPITOR) 10 MG tablet Take 10 mg by mouth daily.   [name] Kitchen CINNAMON PO Take 1,000 mg by mouth daily.  . empagliflozin (JARDIANCE) 25 MG TABS tablet Take 1 tablet (25 mg total) by mouth daily before breakfast.  . glucose blood test strip Use as instructed to test blood sugar 2 times daily E11.65    Onetouch strips  . hydrochlorothiazide (HYDRODIURIL) 25 MG tablet Take 25 mg by mouth daily.   . Insulin Glargine (BASAGLAR KWIKPEN) 100 UNIT/ML Inject 0.38 mLs (38 Units total) into the skin at bedtime.  [name] Kitchen losartan (COZAAR) 100 MG tablet Take 100 mg by mouth daily.   . metFORMIN (GLUCOPHAGE) 1000 MG tablet Take 1 tablet (1,000 mg total) by mouth in the morning and at bedtime.  . metoprolol tartrate (LOPRESSOR) 100 MG tablet Take 100 mg by mouth in the morning and at bedtime.  . Multiple Vitamin (MULTIVITAMIN WITH MINERALS) TABS tablet Take 1 tablet by mouth daily.  . nicotine (NICODERM CQ - DOSED IN MG/24 HR) 7 mg/24hr patch Place 7 mg onto the skin daily.  . Semaglutide,0.25 or 0.5MG /DOS, (OZEMPIC, 0.25 OR 0.5 MG/DOSE,) 2 MG/1.5ML SOPN Inject 0.375 mLs (0.5 mg total) into the skin once a week.  . spironolactone (ALDACTONE) 50 MG tablet Take 50 mg by mouth daily.   . tadalafil (CIALIS) 20 MG tablet Take 20 mg by mouth daily as needed (ED).    Facility-Administered Encounter Medications as of 08/11/2020  Medication  . 0.9 %  sodium chloride infusion     Review of Systems  Review of Systems  Constitutional: Negative for activity change, chills, fatigue, fever and unexpected weight change.  HENT: Negative for postnasal drip, rhinorrhea, sinus pressure, sinus pain and sore throat.   Eyes: Negative.   Respiratory: Negative for cough, shortness of breath and wheezing.   Cardiovascular: Negative for chest pain and  palpitations.  Gastrointestinal: Negative for constipation, diarrhea, nausea and vomiting.  Endocrine: Negative.   Genitourinary: Negative.   Musculoskeletal: Negative.   Skin: Negative.   Neurological: Negative for dizziness and headaches.  Psychiatric/Behavioral: Negative.  Negative for dysphoric mood. The patient is not nervous/anxious.   All other systems reviewed and are negative.    Physical Exam  BP 122/82 (BP Location: Left Arm, Cuff Size: Normal)   Pulse 82   Temp 97.8 F (36.6 C) (Oral)   Ht 6\' 2"  (1.88 m)   Wt 277 lb 6.4 oz (125.8 kg)   SpO2 97%   BMI 35.62  kg/m   Wt Readings from Last 5 Encounters:  08/11/20 277 lb 6.4 oz (125.8 kg)  08/02/20 267 lb (121.1 kg)  07/19/20 267 lb (121.1 kg)  06/28/20 269 lb (122 kg)  06/06/20 276 lb 3.2 oz (125.3 kg)    BMI Readings from Last 5 Encounters:  08/11/20 35.62 kg/m  08/02/20 34.28 kg/m  07/19/20 34.28 kg/m  06/28/20 34.54 kg/m  06/06/20 35.46 kg/m     Physical Exam Vitals and nursing note reviewed.  Constitutional:      General: He is not in acute distress.    Appearance: Normal appearance. He is obese.  HENT:     Head: Normocephalic and atraumatic.     Right Ear: Hearing and external ear normal.     Left Ear: Hearing and external ear normal.     Nose: No mucosal edema.     Right Turbinates: Not enlarged.     Left Turbinates: Not enlarged.     Mouth/Throat:     Mouth: Mucous membranes are dry.     Pharynx: Oropharynx is clear. No oropharyngeal exudate.     Comments: MP2 Eyes:     Pupils: Pupils are equal, round, and reactive to light.  Cardiovascular:     Rate and Rhythm: Normal rate and regular rhythm.     Pulses: Normal pulses.     Heart sounds: Normal heart sounds. No murmur heard.   Pulmonary:     Effort: Pulmonary effort is normal.     Breath sounds: Normal breath sounds. No decreased breath sounds, wheezing or rales.  Musculoskeletal:     Cervical back: Normal range of motion.      Right lower leg: No edema.     Left lower leg: No edema.  Lymphadenopathy:     Cervical: No cervical adenopathy.  Skin:    General: Skin is warm and dry.     Capillary Refill: Capillary refill takes less than 2 seconds.     Findings: No erythema or rash.  Neurological:     General: No focal deficit present.     Mental Status: He is alert and oriented to person, place, and time.     Motor: No weakness.     Coordination: Coordination normal.     Gait: Gait is intact. Gait normal.  Psychiatric:        Mood and Affect: Mood normal.        Behavior: Behavior normal. Behavior is cooperative.        Thought Content: Thought content normal.        Judgment: Judgment normal.       Assessment & Plan:   OSA (obstructive sleep apnea) History of known severe obstructive sleep apnea No CPAP compliance report on file today from DME Reviewed CPAP compliance on patient's phone which showed adequately controlled AHI less than 1  Plan: Continue CPAP therapy Will follow up with patient if any abnormalities are seen on CPAP compliance report   Healthcare maintenance Plan: Flu vaccine today  Former smoker 30-pack-year smoker Quit in March/2021  Plan: Referral to lung cancer screening program Flu vaccine today    Return in about 3 months (around 11/11/2020), or if symptoms worsen or fail to improve, for Follow up with Dr. Elsworth Soho.   Lauraine Rinne, NP 08/11/2020   This appointment required 32 minutes of patient care (this includes precharting, chart review, review of results, face-to-face care, etc.).

## 2020-08-11 NOTE — Assessment & Plan Note (Signed)
30-pack-year smoker Quit in March/2021  Plan: Referral to lung cancer screening program Flu vaccine today

## 2020-08-11 NOTE — Assessment & Plan Note (Signed)
Plan: °Flu vaccine today °

## 2020-08-24 ENCOUNTER — Other Ambulatory Visit: Payer: Self-pay

## 2020-08-24 ENCOUNTER — Encounter (HOSPITAL_BASED_OUTPATIENT_CLINIC_OR_DEPARTMENT_OTHER): Payer: Self-pay | Admitting: Orthopaedic Surgery

## 2020-08-27 ENCOUNTER — Inpatient Hospital Stay (HOSPITAL_COMMUNITY): Admission: RE | Admit: 2020-08-27 | Payer: 59 | Source: Ambulatory Visit

## 2020-08-29 ENCOUNTER — Other Ambulatory Visit (HOSPITAL_COMMUNITY)
Admission: RE | Admit: 2020-08-29 | Discharge: 2020-08-29 | Disposition: A | Discharge status: Home / Self Care | Payer: 59 | Source: Ambulatory Visit | Attending: Orthopaedic Surgery | Admitting: Orthopaedic Surgery

## 2020-08-29 ENCOUNTER — Encounter (HOSPITAL_BASED_OUTPATIENT_CLINIC_OR_DEPARTMENT_OTHER)
Admission: RE | Admit: 2020-08-29 | Discharge: 2020-08-29 | Disposition: A | Discharge status: Home / Self Care | Payer: 59 | Source: Ambulatory Visit | Attending: Orthopaedic Surgery | Admitting: Orthopaedic Surgery

## 2020-08-29 DIAGNOSIS — Z01812 Encounter for preprocedural laboratory examination: Secondary | ICD-10-CM | POA: Insufficient documentation

## 2020-08-29 DIAGNOSIS — Z20822 Contact with and (suspected) exposure to covid-19: Secondary | ICD-10-CM | POA: Diagnosis not present

## 2020-08-29 LAB — BASIC METABOLIC PANEL
Anion gap: 10 (ref 5–15)
BUN: 12 mg/dL (ref 6–20)
CO2: 25 mmol/L (ref 22–32)
Calcium: 9 mg/dL (ref 8.9–10.3)
Chloride: 104 mmol/L (ref 98–111)
Creatinine, Ser: 0.89 mg/dL (ref 0.61–1.24)
GFR, Estimated: >60 mL/min (ref >60)
Glucose, Bld: 115 mg/dL — ABNORMAL HIGH (ref 70–99)
Potassium: 4.4 mmol/L (ref 3.5–5.1)
Sodium: 139 mmol/L (ref 135–145)

## 2020-08-29 LAB — SURGICAL PCR SCREEN
MRSA, PCR: NEGATIVE
Staphylococcus aureus: NEGATIVE

## 2020-08-29 LAB — SARS CORONAVIRUS 2 (TAT 6-24 HRS): SARS Coronavirus 2: NEGATIVE

## 2020-08-29 NOTE — H&P (Signed)
PREOPERATIVE H&P  Chief Complaint: OA RIGHT SHOULDER  HPI: James Parrish is a 54 y.o. male who is scheduled for Procedure(s): REVERSE SHOULDER ARTHROPLASTY.   Patient has a past medical history significant for sleep apnea, HTN, HLD, DM type 2.   He is a 54 year old male who recently moved to the area in the last couple of years. He has had pain in his right shoulder for many years.  He was in [place] and he had an MRI. He was told he had a rotator cuff tear on the right side ten years ago. He never did anything about it. 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 He has trouble sleeping. He has tried medications and injections in the past.   His smptoms are rated as moderate to severe, and have been worsening.  This is significantly impairing activities of daily living.    Please see clinic note for further details on this patient's care.    He has elected for surgical management.   Past Medical History:  Diagnosis Date   Complication of anesthesia    hard to wake up    Diabetes mellitus without complication [facility])    ED (erectile dysfunction)    History of COVID-19 04/2019   Hyperlipidemia    Hypertension    Sleep apnea    uses CPAP nightly   Past Surgical History:  Procedure Laterality Date   COLONOSCOPY     finger surgery Right    Index   KNEE SURGERY Left    KNEE SURGERY Right    ORIF PATELLA Right    SHOULDER ARTHROSCOPY WITH ROTATOR CUFF REPAIR Left 02/04/2020   Procedure: SHOULDER ARTHROSCOPY WITH ROTATOR CUFF REPAIR WITH BICEP TENODESIS;  Surgeon: Hiram Gash, MD;  Location: [facility];  Service: Orthopedics;  Laterality: Left;   Social History   Socioeconomic History   Marital status: Married    Spouse name: Not on file   Number of children: Not on file   Years of education: Not on file   Highest education level: Not on file  Occupational History   Not on file  Tobacco Use   Smoking status:  Former Smoker    Packs/day: 1.00    Years: 30.00    Pack years: 30.00    Types: Cigarettes    Quit date: 12/25/2019    Years since quitting: 0.6   Smokeless tobacco: Never Used  Vaping Use   Vaping Use: Never used  Substance and Sexual Activity   Alcohol use: Yes    Comment: occasionally   Drug use: Never   Sexual activity: Yes  Other Topics Concern   Not on file  Social History Narrative   Not on file   Social Determinants of Health   Financial Resource Strain:    Difficulty of Paying Living Expenses: Not on file  Food Insecurity:    Worried About Charity fundraiser in the Last Year: Not on file   [employer] of Food in the Last Year: Not on file  Transportation Needs:    Lack of Transportation (Medical): Not on file   Lack of Transportation (Non-Medical): Not on file  Physical Activity:    Days of Exercise per Week: Not on file   Minutes of Exercise per Session: Not on file  Stress:    Feeling of Stress : Not on file  Social Connections:    Frequency of Communication with Friends and  Family: Not on file   Frequency of Social Gatherings with Friends and Family: Not on file   Attends Religious Services: Not on file   Active Member of Clubs or Organizations: Not on file   Attends Archivist Meetings: Not on file   Marital Status: Not on file   Family History  Problem Relation Age of Onset   Cancer Mother    Hypertension Mother    Diabetes Mother    Venous thrombosis Son    Colon cancer Neg Hx    Esophageal cancer Neg Hx    Stomach cancer Neg Hx    Rectal cancer Neg Hx    No Known Allergies Prior to Admission medications   Medication Sig Start Date End Date Taking? Authorizing Provider  amLODipine (NORVASC) 10 MG tablet Take 10 mg by mouth at bedtime. 12/15/19  Yes [provider]  aspirin 81 MG EC tablet Take 81 mg by mouth daily. 12/15/19  Yes [provider]  atorvastatin (LIPITOR) 10 MG tablet Take 10 mg by  mouth daily.    Yes [provider]  CINNAMON PO Take 1,000 mg by mouth daily.   Yes [provider]  empagliflozin (JARDIANCE) 25 MG TABS tablet Take 1 tablet (25 mg total) by mouth daily before breakfast. 05/11/20  Yes Shamleffer, Melanie Crazier, MD  hydrochlorothiazide (HYDRODIURIL) 25 MG tablet Take 25 mg by mouth daily.    Yes [provider]  Insulin Glargine (BASAGLAR KWIKPEN) 100 UNIT/ML Inject 0.38 mLs (38 Units total) into the skin at bedtime. 05/11/20  Yes Shamleffer, Melanie Crazier, MD  losartan (COZAAR) 100 MG tablet Take 100 mg by mouth daily.    Yes [provider]  metFORMIN (GLUCOPHAGE) 1000 MG tablet Take 1 tablet (1,000 mg total) by mouth in the morning and at bedtime. 05/11/20  Yes Shamleffer, Melanie Crazier, MD  metoprolol tartrate (LOPRESSOR) 100 MG tablet Take 100 mg by mouth in the morning and at bedtime. 12/15/19  Yes [provider]  Multiple Vitamin (MULTIVITAMIN WITH MINERALS) TABS tablet Take 1 tablet by mouth daily.   Yes [provider]  nicotine (NICODERM CQ - DOSED IN MG/24 HR) 7 mg/24hr patch Place 7 mg onto the skin daily. 05/12/20  Yes [provider]  Semaglutide,0.25 or 0.5MG /DOS, (OZEMPIC, 0.25 OR 0.5 MG/DOSE,) 2 MG/1.5ML SOPN Inject 0.375 mLs (0.5 mg total) into the skin once a week. 06/07/20  Yes Shamleffer, Melanie Crazier, MD  spironolactone (ALDACTONE) 50 MG tablet Take 50 mg by mouth daily.    Yes [provider]  glucose blood test strip Use as instructed to test blood sugar 2 times daily E11.65    Onetouch strips 11/10/19   Shamleffer, Melanie Crazier, MD  tadalafil (CIALIS) 20 MG tablet Take 20 mg by mouth daily as needed (ED).     [provider]    ROS: All other systems have been reviewed and were otherwise negative with the exception of those mentioned in the HPI and as above.  Physical Exam: General: Alert, no acute distress Cardiovascular: No pedal edema Respiratory:  No cyanosis, no use of accessory musculature GI: No organomegaly, abdomen is soft and non-tender Skin: No lesions in the area of chief complaint Neurologic: Sensation intact distally Psychiatric: Patient is competent for consent with normal mood and affect Lymphatic: No axillary or cervical lymphadenopathy  MUSCULOSKELETAL:  Right shoulder demonstrates active forward elevation to 160, external rotation 45 and internal rotation to L2. Cuff strength is extraordinarily weak on the right.  Teres minor appears to be intact.   Imaging: X-rays were reviewed of the right shoulder demonstrating a rotator cuff arthropathy with a Miles Costain 4A  Assessment: OA RIGHT SHOULDER  Plan: Plan for Procedure(s): REVERSE SHOULDER ARTHROPLASTY  The risks benefits and alternatives were discussed with the patient including but not limited to the risks of nonoperative treatment, versus surgical intervention including infection, bleeding, nerve injury,  blood clots, cardiopulmonary complications, morbidity, mortality, among others, and they were willing to proceed.   We additionally specifically discussed risks of axillary nerve injury, infection, periprosthetic fracture, continued pain and longevity of implants prior to beginning procedure.    Patient will be closely monitored in PACU for medical stabilization and pain control. If found stable in PACU, patient may be discharged home with outpatient follow-up. If any concerns regarding patient's stabilization patient will be admitted for observation after surgery. The patient is planning to be discharged home with outpatient PT.   The patient acknowledged the explanation, agreed to proceed with the plan and consent was signed.   Operative Plan: Right reverse total shoulder arthroplasty  Discharge Medications: Tylenol, Celebrex, Oxycodone, Zofran, omeprazole DVT Prophylaxis: Aspirin Physical Therapy: Outpatient PT Special Discharge needs: [place], PA-C  08/29/2020 2:07 PM

## 2020-08-29 NOTE — Progress Notes (Signed)
      Enhanced Recovery after Surgery for Orthopedics Enhanced Recovery after Surgery is a protocol used to improve the stress on your body and your recovery after surgery.  Patient Instructions  . The night before surgery:  o No food after midnight. ONLY clear liquids after midnight  . The day of surgery (if you do NOT have diabetes):  o Drink ONE (1) Pre-Surgery Clear Ensure as directed.   o This drink was given to you during your hospital  pre-op appointment visit. o The pre-op nurse will instruct you on the time to drink the  Pre-Surgery Ensure depending on your surgery time. o Finish the drink at the designated time by the pre-op nurse.  o Nothing else to drink after completing the  Pre-Surgery Clear Ensure.  . The day of surgery (if you have diabetes): o Drink ONE (1) Gatorade 2 (G2) as directed. o This drink was given to you during your hospital  pre-op appointment visit.  o The pre-op nurse will instruct you on the time to drink the   Gatorade 2 (G2) depending on your surgery time. o Color of the Gatorade may vary. Red is not allowed. o Nothing else to drink after completing the  Gatorade 2 (G2).         If you have questions, please contact your surgeon's office. Surgical soap given with instructions, pt verbalized understanding. Benzoyl peroxide gel given with instructions, pt verbalized understanding.

## 2020-08-31 ENCOUNTER — Encounter (HOSPITAL_BASED_OUTPATIENT_CLINIC_OR_DEPARTMENT_OTHER): Payer: Self-pay | Admitting: Orthopaedic Surgery

## 2020-08-31 ENCOUNTER — Ambulatory Visit (HOSPITAL_COMMUNITY): Payer: 59

## 2020-08-31 ENCOUNTER — Ambulatory Visit (HOSPITAL_BASED_OUTPATIENT_CLINIC_OR_DEPARTMENT_OTHER)
Admission: RE | Admit: 2020-08-31 | Discharge: 2020-08-31 | Disposition: A | Discharge status: Home / Self Care | Payer: 59 | Source: Ambulatory Visit | Attending: Orthopaedic Surgery | Admitting: Orthopaedic Surgery

## 2020-08-31 ENCOUNTER — Ambulatory Visit (HOSPITAL_BASED_OUTPATIENT_CLINIC_OR_DEPARTMENT_OTHER): Payer: 59 | Admitting: Certified Registered Nurse Anesthetist

## 2020-08-31 ENCOUNTER — Encounter (HOSPITAL_BASED_OUTPATIENT_CLINIC_OR_DEPARTMENT_OTHER)
Admission: RE | Disposition: A | Discharge status: Home / Self Care | Payer: Self-pay | Source: Ambulatory Visit | Attending: Orthopaedic Surgery

## 2020-08-31 ENCOUNTER — Other Ambulatory Visit: Payer: Self-pay

## 2020-08-31 DIAGNOSIS — G4730 Sleep apnea, unspecified: Secondary | ICD-10-CM | POA: Insufficient documentation

## 2020-08-31 DIAGNOSIS — Z7982 Long term (current) use of aspirin: Secondary | ICD-10-CM | POA: Insufficient documentation

## 2020-08-31 DIAGNOSIS — Z87891 Personal history of nicotine dependence: Secondary | ICD-10-CM | POA: Diagnosis not present

## 2020-08-31 DIAGNOSIS — E785 Hyperlipidemia, unspecified: Secondary | ICD-10-CM | POA: Insufficient documentation

## 2020-08-31 DIAGNOSIS — E119 Type 2 diabetes mellitus without complications: Secondary | ICD-10-CM | POA: Insufficient documentation

## 2020-08-31 DIAGNOSIS — I10 Essential (primary) hypertension: Secondary | ICD-10-CM | POA: Insufficient documentation

## 2020-08-31 DIAGNOSIS — Z8616 Personal history of COVID-19: Secondary | ICD-10-CM | POA: Diagnosis not present

## 2020-08-31 DIAGNOSIS — Z794 Long term (current) use of insulin: Secondary | ICD-10-CM | POA: Insufficient documentation

## 2020-08-31 DIAGNOSIS — Z09 Encounter for follow-up examination after completed treatment for conditions other than malignant neoplasm: Secondary | ICD-10-CM

## 2020-08-31 DIAGNOSIS — Z79899 Other long term (current) drug therapy: Secondary | ICD-10-CM | POA: Diagnosis not present

## 2020-08-31 DIAGNOSIS — M19011 Primary osteoarthritis, right shoulder: Secondary | ICD-10-CM | POA: Insufficient documentation

## 2020-08-31 HISTORY — PX: REVERSE SHOULDER ARTHROPLASTY: SHX5054

## 2020-08-31 LAB — GLUCOSE, CAPILLARY
Glucose-Capillary: 129 mg/dL — ABNORMAL HIGH (ref 70–99)
Glucose-Capillary: 150 mg/dL — ABNORMAL HIGH (ref 70–99)

## 2020-08-31 SURGERY — ARTHROPLASTY, SHOULDER, TOTAL, REVERSE
Anesthesia: General | Site: Shoulder | Laterality: Right

## 2020-08-31 MED ORDER — FENTANYL CITRATE (PF) 100 MCG/2ML IJ SOLN
INTRAMUSCULAR | Status: AC
  Filled 2020-08-31: qty 2

## 2020-08-31 MED ORDER — DEXAMETHASONE SODIUM PHOSPHATE 10 MG/ML IJ SOLN
INTRAMUSCULAR | Status: AC
  Filled 2020-08-31: qty 1

## 2020-08-31 MED ORDER — TRANEXAMIC ACID-NACL 1000-0.7 MG/100ML-% IV SOLN
1000.0000 mg | INTRAVENOUS | Status: AC
  Administered 2020-08-31: 1000 mg via INTRAVENOUS

## 2020-08-31 MED ORDER — OMEPRAZOLE 20 MG PO CPDR: 20.0000 mg | DELAYED_RELEASE_CAPSULE | Freq: Every day | ORAL | 0 refills | Status: DC

## 2020-08-31 MED ORDER — PHENYLEPHRINE 40 MCG/ML (10ML) SYRINGE FOR IV PUSH (FOR BLOOD PRESSURE SUPPORT)
PREFILLED_SYRINGE | INTRAVENOUS | Status: DC | PRN
  Administered 2020-08-31: 80 ug via INTRAVENOUS
  Administered 2020-08-31 (×2): 120 ug via INTRAVENOUS
  Administered 2020-08-31: 80 ug via INTRAVENOUS

## 2020-08-31 MED ORDER — MIDAZOLAM HCL 2 MG/2ML IJ SOLN
INTRAMUSCULAR | Status: AC
  Filled 2020-08-31: qty 2

## 2020-08-31 MED ORDER — MEPERIDINE HCL 25 MG/ML IJ SOLN: 6.2500 mg | INTRAMUSCULAR | Status: DC | PRN

## 2020-08-31 MED ORDER — TRANEXAMIC ACID-NACL 1000-0.7 MG/100ML-% IV SOLN
INTRAVENOUS | Status: AC
  Filled 2020-08-31: qty 100

## 2020-08-31 MED ORDER — BUPIVACAINE HCL (PF) 0.5 % IJ SOLN
INTRAMUSCULAR | Status: DC | PRN
  Administered 2020-08-31: 20 mL via PERINEURAL

## 2020-08-31 MED ORDER — ACETAMINOPHEN 500 MG PO TABS: 1000.0000 mg | ORAL_TABLET | Freq: Three times a day (TID) | ORAL | 0 refills | Status: AC

## 2020-08-31 MED ORDER — HYDROMORPHONE HCL 1 MG/ML IJ SOLN: 0.2500 mg | INTRAMUSCULAR | Status: DC | PRN

## 2020-08-31 MED ORDER — VANCOMYCIN HCL 1000 MG IV SOLR
INTRAVENOUS | Status: DC | PRN
  Administered 2020-08-31: 1000 mg via TOPICAL

## 2020-08-31 MED ORDER — ONDANSETRON HCL 4 MG/2ML IJ SOLN
INTRAMUSCULAR | Status: DC | PRN
  Administered 2020-08-31: 4 mg via INTRAVENOUS

## 2020-08-31 MED ORDER — CEFAZOLIN SODIUM 1 G IJ SOLR
INTRAMUSCULAR | Status: AC
  Filled 2020-08-31: qty 10

## 2020-08-31 MED ORDER — ASPIRIN 81 MG PO CHEW: 81.0000 mg | CHEWABLE_TABLET | Freq: Two times a day (BID) | ORAL | 0 refills | Status: AC

## 2020-08-31 MED ORDER — CELECOXIB 100 MG PO CAPS: 100.0000 mg | ORAL_CAPSULE | Freq: Two times a day (BID) | ORAL | 0 refills | Status: AC

## 2020-08-31 MED ORDER — BUPIVACAINE LIPOSOME 1.3 % IJ SUSP
INTRAMUSCULAR | Status: DC | PRN
  Administered 2020-08-31: 10 mL via PERINEURAL

## 2020-08-31 MED ORDER — ROCURONIUM BROMIDE 10 MG/ML (PF) SYRINGE
PREFILLED_SYRINGE | INTRAVENOUS | Status: DC | PRN
  Administered 2020-08-31: 100 mg via INTRAVENOUS

## 2020-08-31 MED ORDER — ONDANSETRON HCL 4 MG PO TABS: 4.0000 mg | ORAL_TABLET | Freq: Three times a day (TID) | ORAL | 1 refills | Status: AC | PRN

## 2020-08-31 MED ORDER — OXYCODONE HCL 5 MG PO TABS
ORAL_TABLET | ORAL | 0 refills | Status: AC
Start: 2020-08-31 — End: 2020-09-05

## 2020-08-31 MED ORDER — SODIUM CHLORIDE 0.9 % IV SOLN
INTRAVENOUS | Status: AC | PRN
  Administered 2020-08-31: 1000 mL

## 2020-08-31 MED ORDER — OXYCODONE HCL 5 MG/5ML PO SOLN: 5.0000 mg | Freq: Once | ORAL | Status: DC | PRN

## 2020-08-31 MED ORDER — PROPOFOL 10 MG/ML IV BOLUS
INTRAVENOUS | Status: DC | PRN
  Administered 2020-08-31: 200 mg via INTRAVENOUS

## 2020-08-31 MED ORDER — OXYCODONE HCL 5 MG PO TABS: 5.0000 mg | ORAL_TABLET | Freq: Once | ORAL | Status: DC | PRN

## 2020-08-31 MED ORDER — PHENYLEPHRINE HCL (PRESSORS) 10 MG/ML IV SOLN
INTRAVENOUS | Status: AC
  Filled 2020-08-31: qty 1

## 2020-08-31 MED ORDER — VANCOMYCIN HCL 500 MG IV SOLR
INTRAVENOUS | Status: AC
  Filled 2020-08-31: qty 500

## 2020-08-31 MED ORDER — DEXAMETHASONE SODIUM PHOSPHATE 10 MG/ML IJ SOLN
INTRAMUSCULAR | Status: DC | PRN
  Administered 2020-08-31: 4 mg via INTRAVENOUS

## 2020-08-31 MED ORDER — PROMETHAZINE HCL 25 MG/ML IJ SOLN: 6.2500 mg | INTRAMUSCULAR | Status: DC | PRN

## 2020-08-31 MED ORDER — LACTATED RINGERS IV SOLN: INTRAVENOUS | Status: DC

## 2020-08-31 MED ORDER — ONDANSETRON HCL 4 MG/2ML IJ SOLN
INTRAMUSCULAR | Status: AC
  Filled 2020-08-31: qty 2

## 2020-08-31 MED ORDER — AMISULPRIDE (ANTIEMETIC) 5 MG/2ML IV SOLN: 10.0000 mg | Freq: Once | INTRAVENOUS | Status: DC | PRN

## 2020-08-31 MED ORDER — VANCOMYCIN HCL 1000 MG IV SOLR
INTRAVENOUS | Status: AC
  Filled 2020-08-31: qty 1000

## 2020-08-31 MED ORDER — PROPOFOL 500 MG/50ML IV EMUL
INTRAVENOUS | Status: AC
  Filled 2020-08-31: qty 50

## 2020-08-31 MED ORDER — SUGAMMADEX SODIUM 200 MG/2ML IV SOLN
INTRAVENOUS | Status: DC | PRN
  Administered 2020-08-31: 300 mg via INTRAVENOUS

## 2020-08-31 MED ORDER — MIDAZOLAM HCL 2 MG/2ML IJ SOLN
2.0000 mg | Freq: Once | INTRAMUSCULAR | Status: AC
  Administered 2020-08-31: 2 mg via INTRAVENOUS

## 2020-08-31 MED ORDER — FENTANYL CITRATE (PF) 100 MCG/2ML IJ SOLN
100.0000 ug | Freq: Once | INTRAMUSCULAR | Status: AC
  Administered 2020-08-31: 100 ug via INTRAVENOUS

## 2020-08-31 MED ORDER — LIDOCAINE 2% (20 MG/ML) 5 ML SYRINGE
INTRAMUSCULAR | Status: AC
  Filled 2020-08-31: qty 5

## 2020-08-31 MED ORDER — PHENYLEPHRINE HCL-NACL 10-0.9 MG/250ML-% IV SOLN
INTRAVENOUS | Status: DC | PRN
  Administered 2020-08-31: 20 ug/min via INTRAVENOUS

## 2020-08-31 MED ORDER — LIDOCAINE 2% (20 MG/ML) 5 ML SYRINGE
INTRAMUSCULAR | Status: DC | PRN
  Administered 2020-08-31: 80 mg via INTRAVENOUS

## 2020-08-31 MED ORDER — CEFAZOLIN SODIUM-DEXTROSE 2-4 GM/100ML-% IV SOLN
2.0000 g | INTRAVENOUS | Status: AC
  Administered 2020-08-31: 3 g via INTRAVENOUS

## 2020-08-31 MED ORDER — FENTANYL CITRATE (PF) 100 MCG/2ML IJ SOLN
INTRAMUSCULAR | Status: DC | PRN
Start: 2020-08-31 — End: 2020-08-31
  Administered 2020-08-31 (×2): 50 ug via INTRAVENOUS

## 2020-08-31 MED ORDER — PHENYLEPHRINE 40 MCG/ML (10ML) SYRINGE FOR IV PUSH (FOR BLOOD PRESSURE SUPPORT)
PREFILLED_SYRINGE | INTRAVENOUS | Status: AC
  Filled 2020-08-31: qty 10

## 2020-08-31 MED ORDER — ROCURONIUM BROMIDE 10 MG/ML (PF) SYRINGE
PREFILLED_SYRINGE | INTRAVENOUS | Status: AC
  Filled 2020-08-31: qty 10

## 2020-08-31 MED ORDER — CEFAZOLIN SODIUM-DEXTROSE 2-4 GM/100ML-% IV SOLN
INTRAVENOUS | Status: AC
  Filled 2020-08-31: qty 100

## 2020-08-31 SURGICAL SUPPLY — 75 items
AID PSTN UNV HD RSTRNT DISP (MISCELLANEOUS) ×1
APL PRP STRL LF DISP 70% ISPRP (MISCELLANEOUS) ×1
BASEPLATE REV SHOULDER 29 OD (Plate) ×2 IMPLANT
BIT DRILL 3.2 PERIPHERAL SCREW (BIT) ×2 IMPLANT
BLADE HEX COATED 2.75 (ELECTRODE) IMPLANT
BLADE SAW SAG 73X25 THK (BLADE) ×1
BLADE SAW SGTL 73X25 THK (BLADE) ×1 IMPLANT
BLADE SURG 10 STRL SS (BLADE) IMPLANT
BLADE SURG 15 STRL LF DISP TIS (BLADE) IMPLANT
BLADE SURG 15 STRL SS (BLADE)
BNDG COHESIVE 4X5 TAN STRL (GAUZE/BANDAGES/DRESSINGS) IMPLANT
BSPLAT GLND STD 29 RVRS SHLDR (Plate) ×1 IMPLANT
CHLORAPREP W/TINT 26 (MISCELLANEOUS) ×2 IMPLANT
CLSR STERI-STRIP ANTIMIC 1/2X4 (GAUZE/BANDAGES/DRESSINGS) ×4 IMPLANT
COOLER ICEMAN CLASSIC (MISCELLANEOUS) ×2 IMPLANT
COVER BACK TABLE 60X90IN (DRAPES) ×2 IMPLANT
COVER MAYO STAND STRL (DRAPES) ×2 IMPLANT
COVER WAND RF STERILE (DRAPES) IMPLANT
DECANTER SPIKE VIAL GLASS SM (MISCELLANEOUS) IMPLANT
DRAPE IMP U-DRAPE 54X76 (DRAPES) IMPLANT
DRAPE INCISE IOBAN 66X45 STRL (DRAPES) ×2 IMPLANT
DRAPE U-SHAPE 76X120 STRL (DRAPES) ×4 IMPLANT
DRSG AQUACEL AG ADV 3.5X 6 (GAUZE/BANDAGES/DRESSINGS) ×2 IMPLANT
ELECT BLADE 4.0 EZ CLEAN MEGAD (MISCELLANEOUS) ×4
ELECT REM PT RETURN 9FT ADLT (ELECTROSURGICAL) ×2
ELECTRODE BLDE 4.0 EZ CLN MEGD (MISCELLANEOUS) ×2 IMPLANT
ELECTRODE REM PT RTRN 9FT ADLT (ELECTROSURGICAL) ×1 IMPLANT
GLENOSPHERE STANDARD 39 (Joint) ×2 IMPLANT
GLENOSPHERE STD 39 (Joint) ×1 IMPLANT
GLOVE BIO SURGEON STRL SZ 6.5 (GLOVE) ×4 IMPLANT
GLOVE BIO SURGEON STRL SZ7 (GLOVE) ×2 IMPLANT
GLOVE BIO SURGEON STRL SZ7.5 (GLOVE) ×2 IMPLANT
GLOVE BIOGEL PI IND STRL 6.5 (GLOVE) ×1 IMPLANT
GLOVE BIOGEL PI IND STRL 7.0 (GLOVE) ×3 IMPLANT
GLOVE BIOGEL PI IND STRL 8 (GLOVE) ×1 IMPLANT
GLOVE BIOGEL PI INDICATOR 6.5 (GLOVE) ×1
GLOVE BIOGEL PI INDICATOR 7.0 (GLOVE) ×3
GLOVE BIOGEL PI INDICATOR 8 (GLOVE) ×1
GLOVE ECLIPSE 6.5 STRL STRAW (GLOVE) ×4 IMPLANT
GLOVE ECLIPSE 8.0 STRL XLNG CF (GLOVE) ×4 IMPLANT
GOWN STRL REUS W/ TWL LRG LVL3 (GOWN DISPOSABLE) ×3 IMPLANT
GOWN STRL REUS W/TWL LRG LVL3 (GOWN DISPOSABLE) ×6
GOWN STRL REUS W/TWL XL LVL3 (GOWN DISPOSABLE) ×2 IMPLANT
GUIDEWIRE GLENOID 2.5X220 (WIRE) ×2 IMPLANT
HANDPIECE INTERPULSE COAX TIP (DISPOSABLE) ×2
IMPL REVERSE SHOULDER 0X3.5 (Shoulder) ×1 IMPLANT
IMPLANT REVERSE SHOULDER 0X3.5 (Shoulder) ×2 IMPLANT
INSERT REV KIT SHOULDER 6X39 (Screw) ×2 IMPLANT
KIT STABILIZATION SHOULDER (MISCELLANEOUS) ×2 IMPLANT
MANIFOLD NEPTUNE II (INSTRUMENTS) ×2 IMPLANT
PACK BASIN DAY SURGERY FS (CUSTOM PROCEDURE TRAY) ×2 IMPLANT
PACK SHOULDER (CUSTOM PROCEDURE TRAY) ×2 IMPLANT
PAD COLD SHLDR WRAP-ON (PAD) ×2 IMPLANT
PENCIL SMOKE EVACUATOR (MISCELLANEOUS) ×2 IMPLANT
RESTRAINT HEAD UNIVERSAL NS (MISCELLANEOUS) ×2 IMPLANT
SCREW 5.5X22 (Screw) ×2 IMPLANT
SCREW BONE THREAD 6.5X35 (Screw) ×2 IMPLANT
SCREW PERIPHERAL 5.0X34 (Screw) ×2 IMPLANT
SET HNDPC FAN SPRY TIP SCT (DISPOSABLE) ×1 IMPLANT
SHEET MEDIUM DRAPE 40X70 STRL (DRAPES) ×2 IMPLANT
SLEEVE SCD COMPRESS KNEE MED (MISCELLANEOUS) ×2 IMPLANT
SPONGE LAP 18X18 RF (DISPOSABLE) IMPLANT
STEM HUMERAL SZ2B STND 70 PTC (Stem) ×2 IMPLANT
STEM HUMERAL SZ2BSTD 70 PTC (Stem) ×1 IMPLANT
SUT ETHIBOND 2 V 37 (SUTURE) ×2 IMPLANT
SUT ETHIBOND NAB CT1 #1 30IN (SUTURE) ×2 IMPLANT
SUT FIBERWIRE #5 38 CONV NDL (SUTURE) ×8
SUT MNCRL AB 4-0 PS2 18 (SUTURE) ×2 IMPLANT
SUT VIC AB 3-0 SH 27 (SUTURE) ×2
SUT VIC AB 3-0 SH 27X BRD (SUTURE) ×1 IMPLANT
SUTURE FIBERWR #5 38 CONV NDL (SUTURE) ×4 IMPLANT
SYR BULB IRRIG 60ML STRL (SYRINGE) IMPLANT
TOWEL GREEN STERILE FF (TOWEL DISPOSABLE) ×6 IMPLANT
TUBE CONNECTING 20X1/4 (TUBING) ×2 IMPLANT
TUBE SUCTION HIGH CAP CLEAR NV (SUCTIONS) ×2 IMPLANT

## 2020-08-31 NOTE — Anesthesia Postprocedure Evaluation (Signed)
Anesthesia Post Note  Patient: James Parrish  Procedure(s) Performed: REVERSE SHOULDER ARTHROPLASTY (Right Shoulder)     Patient location during evaluation: PACU Anesthesia Type: General Level of consciousness: awake and alert Pain management: pain level controlled Vital Signs Assessment: post-procedure vital signs reviewed and stable Respiratory status: spontaneous breathing, nonlabored ventilation and respiratory function stable Cardiovascular status: blood pressure returned to baseline and stable Postop Assessment: no apparent nausea or vomiting Anesthetic complications: no   No complications documented.  Last Vitals:  Vitals:   08/31/20 1038 08/31/20 1045  BP:  (!) 122/92  Pulse:  88  Resp:  (!) 21  Temp:    SpO2: 94% 94%    Last Pain:  Vitals:   08/31/20 1045  TempSrc:   PainSc: 0-No pain                 Lynda Rainwater

## 2020-08-31 NOTE — Interval H&P Note (Signed)
History and Physical Interval Note:  08/31/2020 8:13 AM  James Parrish  has presented today for surgery, with the diagnosis of OA RIGHT SHOULDER.  The various methods of treatment have been discussed with the patient and family. After consideration of risks, benefits and other options for treatment, the patient has consented to  Procedure(s): REVERSE SHOULDER ARTHROPLASTY (Right) as a surgical intervention.  The patient's history has been reviewed, patient examined, no change in status, stable for surgery.  I have reviewed the patient's chart and labs.  Questions were answered to the patient's satisfaction.     Hiram Gash

## 2020-08-31 NOTE — Discharge Instructions (Signed)
°Post Anesthesia Home Care Instructions ° °Activity: °Get plenty of rest for the remainder of the day. A responsible individual must stay with you for 24 hours following the procedure.  °For the next 24 hours, DO NOT: °-Drive a car °-Operate machinery °-Drink alcoholic beverages °-Take any medication unless instructed by your physician °-Make any legal decisions or sign important papers. ° °Meals: °Start with liquid foods such as gelatin or soup. Progress to regular foods as tolerated. Avoid greasy, spicy, heavy foods. If nausea and/or vomiting occur, drink only clear liquids until the nausea and/or vomiting subsides. Call your physician if vomiting continues. ° °Special Instructions/Symptoms: °Your throat may feel dry or sore from the anesthesia or the breathing tube placed in your throat during surgery. If this causes discomfort, gargle with warm salt water. The discomfort should disappear within 24 hours. ° °If you had a scopolamine patch placed behind your ear for the management of post- operative nausea and/or vomiting: ° °1. The medication in the patch is effective for 72 hours, after which it should be removed.  Wrap patch in a tissue and discard in the trash. Wash hands thoroughly with soap and water. °2. You may remove the patch earlier than 72 hours if you experience unpleasant side effects which may include dry mouth, dizziness or visual disturbances. °3. Avoid touching the patch. Wash your hands with soap and water after contact with the patch. °   ° ° °Information for Discharge Teaching: °EXPAREL (bupivacaine liposome injectable suspension)  ° °Your surgeon or anesthesiologist gave you EXPAREL(bupivacaine) to help control your pain after surgery.  °· EXPAREL is a local anesthetic that provides pain relief by numbing the tissue around the surgical site. °· EXPAREL is designed to release pain medication over time and can control pain for up to 72 hours. °· Depending on how you respond to EXPAREL, you may  require less pain medication during your recovery. ° °Possible side effects: °· Temporary loss of sensation or ability to move in the area where bupivacaine was injected. °· Nausea, vomiting, constipation °· Rarely, numbness and tingling in your mouth or lips, lightheadedness, or anxiety may occur. °· Call your doctor right away if you think you may be experiencing any of these sensations, or if you have other questions regarding possible side effects. ° °Follow all other discharge instructions given to you by your surgeon or nurse. Eat a healthy diet and drink plenty of water or other fluids. ° °If you return to the hospital for any reason within 96 hours following the administration of EXPAREL, it is important for health care providers to know that you have received this anesthetic. A teal colored band has been placed on your arm with the date, time and amount of EXPAREL you have received in order to alert and inform your health care providers. Please leave this armband in place for the full 96 hours following administration, and then you may remove the band. ° ° °Regional Anesthesia Blocks ° °1. Numbness or the inability to move the "blocked" extremity may last from 3-48 hours after placement. The length of time depends on the medication injected and your individual response to the medication. If the numbness is not going away after 48 hours, call your surgeon. ° °2. The extremity that is blocked will need to be protected until the numbness is gone and the  Strength has returned. Because you cannot feel it, you will need to take extra care to avoid injury. Because it may be weak, you   may have difficulty moving it or using it. You may not know what position it is in without looking at it while the block is in effect. ° °3. For blocks in the legs and feet, returning to weight bearing and walking needs to be done carefully. You will need to wait until the numbness is entirely gone and the strength has returned. You  should be able to move your leg and foot normally before you try and bear weight or walk. You will need someone to be with you when you first try to ensure you do not fall and possibly risk injury. ° °4. Bruising and tenderness at the needle site are common side effects and will resolve in a few days. ° °5. Persistent numbness or new problems with movement should be communicated to the surgeon or the  Surgery Center ([phone])/ [facility] ([phone]). °

## 2020-08-31 NOTE — Op Note (Signed)
Orthopaedic Surgery Operative Note (CSN: [id number])  James Parrish  1966-01-27 Date of Surgery: 08/31/2020   Diagnoses:  Right shoulder rotator cuff arthropathy  Procedure: Right reverse total Shoulder Arthroplasty   Operative Finding Successful completion of planned procedure.  Good fixation and stable construct with no major abnormalities during surgery.  Cuff was irreparably torn.  Good bone quality.  Post-operative plan: The patient will be NWB in sling.  The patient will be will be discharged from PACU if continues to be stable as was plan prior to surgery.  DVT prophylaxis Aspirin 81 mg twice daily for 6 weeks.  Pain control with PRN pain medication preferring oral medicines.  Follow up plan will be scheduled in approximately 7 days for incision check and XR.  Physical therapy to start after first visit.  Implants: Tornier flex to B stem, high offset 0+ tray, 39+639 standard sphere, 29 baseplate, 6.5 x 35 mm center screw Post-Op Diagnosis: Same Surgeons:Primary: Hiram Gash, MD Assistants:Caroline McBane PA-C Location: [place] OR ROOM 5 Anesthesia: General with Exparel Interscalene Antibiotics: Ancef 2g preop, Vancomycin 1029m locally Tourniquet time: None Estimated Blood Loss: 1381Complications: None Specimens: None Implants: Implant Name Type Inv. Item Serial No. Manufacturer Lot No. LRB No. Used Action  BASEPLATE REV SHOULDER 277NHOD - [id number] BASEPLATE REV SHOULDER 283ANOD [id number]  Right 1 Implanted  BONE SCREW THREAD 6.5X35MM - [id number] BONE SCREW THREAD 6.5X35MM  [employer] STERILIZED ON SET Right 1 Implanted  SCREW PERIPHERAL 5.0X34 - [id number] SCREW PERIPHERAL 5.0X34  [employer] STERILIZED ON SET Right 1 Implanted  SCREW 5.5X22 - [id number] SCREW 5.5X22  [employer] STERILIZED ON SET Right 1 Implanted  Standard Glenosphere   [id number]  Right 1 Implanted  Reversed Inset   [id number]  Right 1 Implanted   IMPLANT REVERSE SHOULDER 0X3.5 - [id number] IMPLANT REVERSE SHOULDER 0X3.5 [id number]  Right 1 Implanted  STEM HUMERAL SZ2B STND 70 PTC - [id number] STEM HUMERAL SZ2B STND 70 PTC [id number]  Right 1 Implanted    Indications for Surgery:   James FLOREAis a 54y.o. male with irreparable rotator cuff tear and early rotator cuff arthropathy pain surgery secondary to failed nonoperative measures.  Benefits and risks of operative and nonoperative management were discussed prior to surgery with patient/guardian(s) and informed consent form was completed.  Infection and need for further surgery were discussed as was prosthetic stability and cuff issues.  We additionally specifically discussed risks of axillary nerve injury, infection, periprosthetic fracture, continued pain and longevity of implants prior to beginning procedure.      Procedure:   The patient was identified in the preoperative holding area where the surgical site was marked. Block placed by anesthesia with exparel.  The patient was taken to the OR where a procedural timeout was called and the above noted anesthesia was induced.  The patient was positioned beachchair on allen table with spider arm positioner.  Preoperative antibiotics were dosed.  The patient's right shoulder was prepped and draped in the usual sterile fashion.  A second preoperative timeout was called.       Standard deltopectoral approach was performed with a #10 blade. We dissected down to the subcutaneous tissues and the cephalic vein was taken laterally with the deltoid. Clavipectoral fascia was incised in line with the incision. Deep retractors were placed. The long of the biceps tendon was identified and there was significant tenosynovitis  present.  Tenodesis was performed to the pectoralis tendon with #2 Ethibond. The remaining biceps was followed up into the rotator interval where it was released.   The subscapularis was taken  down in a full thickness layer with capsule along the humeral neck extending inferiorly around the humeral head. We continued releasing the capsule directly off of the osteophytes inferiorly all the way around the corner. This allowed [place] to dislocate the humeral head.   The humeral head had evidence of severe osteoarthritic wear with full-thickness cartilage loss and exposed subchondral bone.  The rotator cuff was carefully examined and noted to be irreperably torn.  The decision was confirmed that a reverse total shoulder was indicated for this patient.  There were osteophytes along the inferior humeral neck. The osteophytes were removed with an osteotome and a rongeur.  Osteophytes were removed with a rongeur and an osteotome and the anatomic neck was well visualized.     A humeral cutting guide was inserted down the intramedullary canal. The version was set at 20 of retroversion. Humeral osteotomy was performed with an oscillating saw. The head fragment was passed off the back table. A starter awl was used to open the humeral canal. We next used T-handle straight sound reamers to ream up to an appropriate fit. A chisel was used to remove proximal humeral bone. We then broached starting with a size one broach and broaching up to 2 which obtained an appropriate fit. The broach handle was removed. A cut protector was placed. The broach handle was removed and a cut protector was placed. The humerus was retracted posteriorly and we turned our attention to glenoid exposure.  The subscapularis was again identified and immediately we took care to palpate the axillary nerve anteriorly and verify its position with gentle palpation as well as the tug test.  We then released the SGHL with bovie cautery prior to placing a curved mayo at the junction of the anterior glenoid well above the axillary nerve and bluntly dissecting the subscapularis from the capsule.  We then carefully protected the axillary nerve as we  gently released the inferior capsule to fully mobilize the subscapularis.  An anterior deltoid retractor was then placed as well as a small Hohmann retractor superiorly.   Glenoid relatively intact in setting irreparable rotator cuff tear  The glenoid drill guide was placed and used to drill a guide pin in the center, inferior position. The glenoid face was then reamed concentrically over the guide wire. The center hole was drilled over the guidepin in a near anatomic angle of version. Next the  glenoid vault was drilled back to a depth of 40 mm.  We tapped and then placed a 103m size baseplate with 241mlateralization was selected with a 6.5 mm x 35 mm length central screw.  We decided to put in a slightly shorter screw as we were not able to get bicortical and a longer screw may cause issues as we were right down the middle of the scapular body.  Bone quality was such robust bone that we are worried that we may fracture or incompletely seen our baseplate.  The base plate was screwed into the glenoid vault obtaining secure fixation. We next placed superior and inferior locking screws for additional fixation.  Next a 39 mm glenosphere was selected and impacted onto the baseplate. The center screw was tightened.  We turned attention back to the humeral side. The cut protector was removed. We trialed with multiple size tray and  polyethylene options and selected a 6 which provided good stability and range of motion without excess soft tissue tension. The offset was dialed in to match the normal anatomy. The shoulder was trialed.  There was good ROM in all planes and the shoulder was stable with no inferior translation.  The real humeral implants were opened after again confirming sizes.  The trial was removed. #5 Fiberwire x4 sutures passed through the humeral neck for subscap repair. The humeral component was press-fit obtaining a secure fit. A +0 high offset tray was selected and impacted onto the stem.  A  39+6 polyethylene liner was impacted onto the stem.  The joint was reduced and thoroughly irrigated with pulsatile lavage. Subscap was repaired back with #5 Fiberwire sutures through bone tunnels. Hemostasis was obtained. The deltopectoral interval was reapproximated with #1 Ethibond. The subcutaneous tissues were closed with 2-0 Vicryl and the skin was closed with running monocryl.    The wounds were cleaned and dried and an Aquacel dressing was placed. The drapes taken down. The arm was placed into sling with abduction pillow. Patient was awakened, extubated, and transferred to the recovery room in stable condition. There were no intraoperative complications. The sponge, needle, and attention counts were  correct at the end of the case.        Noemi Chapel, PA-C, present and scrubbed throughout the case, critical for completion in a timely fashion, and for retraction, instrumentation, closure.

## 2020-08-31 NOTE — Anesthesia Preprocedure Evaluation (Signed)
Anesthesia Evaluation  Patient identified by MRN, date of birth, ID band Patient awake    Reviewed: Allergy & Precautions, NPO status , Patient's Chart, lab work & pertinent test results, reviewed documented beta blocker date and time   Airway Mallampati: II  TM Distance: >3 FB Neck ROM: Full    Dental no notable dental hx.    Pulmonary sleep apnea , former smoker,    Pulmonary exam normal breath sounds clear to auscultation       Cardiovascular hypertension, Pt. on medications and Pt. on home beta blockers Normal cardiovascular exam Rhythm:Regular Rate:Normal     Neuro/Psych negative neurological ROS  negative psych ROS   GI/Hepatic negative GI ROS, Neg liver ROS,   Endo/Other  diabetes, Type 2, Oral Hypoglycemic Agents  Renal/GU negative Renal ROS  negative genitourinary   Musculoskeletal negative musculoskeletal ROS (+)   Abdominal (+) + obese,   Peds negative pediatric ROS (+)  Hematology negative hematology ROS (+)   Anesthesia Other Findings   Reproductive/Obstetrics negative OB ROS                             Anesthesia Physical  Anesthesia Plan  ASA: III  Anesthesia Plan: General   Post-op Pain Management:  Regional for Post-op pain   Induction: Intravenous  PONV Risk Score and Plan: 2 and Ondansetron, Treatment may vary due to age or medical condition and Midazolam  Airway Management Planned: Oral ETT  Additional Equipment:   Intra-op Plan:   Post-operative Plan: Extubation in OR  Informed Consent: I have reviewed the patients History and Physical, chart, labs and discussed the procedure including the risks, benefits and alternatives for the proposed anesthesia with the patient or authorized representative who has indicated his/her understanding and acceptance.     Dental advisory given  Plan Discussed with: CRNA and Surgeon  Anesthesia Plan Comments:          Anesthesia Quick Evaluation

## 2020-08-31 NOTE — Anesthesia Procedure Notes (Signed)
Anesthesia Regional Block: Interscalene brachial plexus block   Pre-Anesthetic Checklist: ,, timeout performed, Correct Patient, Correct Site, Correct Laterality, Correct Procedure, Correct Position, site marked, Risks and benefits discussed,  Surgical consent,  Pre-op evaluation,  At surgeon's request and post-op pain management  Laterality: Right  Prep: chloraprep       Needles:  Injection technique: Single-shot  Needle Type: Stimiplex     Needle Length: 9cm  Needle Gauge: 21     Additional Needles:   Procedures:,,,, ultrasound used (permanent image in chart),,,,  Narrative:  Start time: 08/31/2020 8:14 AM End time: 08/31/2020 8:19 AM Injection made incrementally with aspirations every 5 mL.  Performed by: Personally  Anesthesiologist: Lynda Rainwater, MD

## 2020-08-31 NOTE — Transfer of Care (Signed)
Immediate Anesthesia Transfer of Care Note  Patient: James Parrish  Procedure(s) Performed: REVERSE SHOULDER ARTHROPLASTY (Right Shoulder)  Patient Location: PACU  Anesthesia Type:GA combined with regional for post-op pain  Level of Consciousness: awake, alert  and oriented  Airway & Oxygen Therapy: Patient Spontanous Breathing and Patient connected to face mask oxygen  Post-op Assessment: Report given to RN and Post -op Vital signs reviewed and stable  Post vital signs: Reviewed and stable  Last Vitals:  Vitals Value Taken Time  BP 146/104 08/31/20 1013  Temp    Pulse 85   Resp 29 08/31/20 1014  SpO2 98%   Vitals shown include unvalidated device data.  Last Pain:  Vitals:   08/31/20 0704  TempSrc: Oral  PainSc: 0-No pain         Complications: No complications documented.

## 2020-08-31 NOTE — Anesthesia Procedure Notes (Signed)

## 2020-08-31 NOTE — Progress Notes (Signed)
Assisted Dr. Miller with right, ultrasound guided, interscalene  block. Side rails up, monitors on throughout procedure. See vital signs in flow sheet. Tolerated Procedure well. 

## 2020-09-01 ENCOUNTER — Encounter (HOSPITAL_BASED_OUTPATIENT_CLINIC_OR_DEPARTMENT_OTHER): Payer: Self-pay | Admitting: Orthopaedic Surgery

## 2020-09-06 ENCOUNTER — Other Ambulatory Visit: Payer: Self-pay

## 2020-09-06 ENCOUNTER — Telehealth (INDEPENDENT_AMBULATORY_CARE_PROVIDER_SITE_OTHER): Payer: 59 | Admitting: Cardiology

## 2020-09-06 ENCOUNTER — Encounter: Payer: Self-pay | Admitting: Cardiology

## 2020-09-06 VITALS — BP 165/100 | HR 86 | Ht 74.0 in | Wt 267.0 lb

## 2020-09-06 DIAGNOSIS — E669 Obesity, unspecified: Secondary | ICD-10-CM | POA: Diagnosis not present

## 2020-09-06 DIAGNOSIS — I10 Essential (primary) hypertension: Secondary | ICD-10-CM | POA: Diagnosis not present

## 2020-09-06 DIAGNOSIS — G4733 Obstructive sleep apnea (adult) (pediatric): Secondary | ICD-10-CM

## 2020-09-06 NOTE — Patient Instructions (Signed)

## 2020-09-06 NOTE — Progress Notes (Signed)
Virtual Visit via Telephone Note   This visit type was conducted due to national recommendations for restrictions regarding the COVID-19 Pandemic (e.g. social distancing) in an effort to limit this patient's exposure and mitigate transmission in our community.  Due to his co-morbid illnesses, this patient is at least at moderate risk for complications without adequate follow up.  This format is felt to be most appropriate for this patient at this time.  The patient did not have access to video technology/had technical difficulties with video requiring transitioning to audio format only (telephone).  All issues noted in this document were discussed and addressed.  No physical exam could be performed with this format.  Please refer to the patient's chart for his  consent to telehealth for [facility].    Date:  09/06/2020   ID:  James Parrish, DOB Dec 28, 1965, MRN [id number] The patient was identified using 2 identifiers.  Patient Location: Home Provider Location: Clinic Office  PCP:  Sueanne Margarita, DO   Evaluation Performed:  Consultation - HISAO DOO was referred by Maebelle Munroe, DO for the evaluation of OSA.  Chief Complaint:  OSA  History of Present Illness:    James Parrish is a 54 y.o. male with a hx of DM, HLD, HTN and recently was referred for home sleep study by Georgina Pillion DO due to excessive daytime sleepiness with an Epworth Sleepiness Score of 11.  He tells me that he went for a DOT PE and took a measurement of his neck along with the high ESS and recommended HST before clearance.  He tells me that he felt tired at the end of the day but did not have any problems falling asleep during the day.  He was told by his wife that he would snore stop breathing during his sleep.  He underwent home sleep study showing severe OSA with an AHI of 79.1/hr and nocturnal hypoxemia with a nadir O2 sat of 77%.  Due to severity of OSA, he was referred for CPAP titration and was titrated  to 16cm H2O.    He is doing well with his CPAP device and thinks that he has gotten used to it.  He tolerates the mask and feels the pressure is adequate.  Since going on CPAP he feels rested in the am and has no significant daytime sleepiness.  He has some problems with mouth dryness.  He does not think that he snores.    The patient does not have symptoms concerning for COVID-19 infection (fever, chills, cough, or new shortness of breath).   Past Medical History:  Diagnosis Date  . Complication of anesthesia    hard to wake up   . Diabetes mellitus without complication ([place])   . ED (erectile dysfunction)   . History of COVID-19 04/2019  . Hyperlipidemia   . Hypertension   . Sleep apnea    uses CPAP nightly   Past Surgical History:  Procedure Laterality Date  . COLONOSCOPY    . finger surgery Right    Index  . KNEE SURGERY Left   . KNEE SURGERY Right   . ORIF PATELLA Right   . REVERSE SHOULDER ARTHROPLASTY Right 08/31/2020   Procedure: REVERSE SHOULDER ARTHROPLASTY;  Surgeon: Hiram Gash, MD;  Location: [facility];  Service: Orthopedics;  Laterality: Right;  . SHOULDER ARTHROSCOPY WITH ROTATOR CUFF REPAIR Left 02/04/2020   Procedure: SHOULDER ARTHROSCOPY WITH ROTATOR CUFF REPAIR WITH BICEP TENODESIS;  Surgeon: Hiram Gash, MD;  Location: [facility];  Service: Orthopedics;  Laterality: Left;     Current Meds  Medication Sig  . acetaminophen (TYLENOL) 500 MG tablet Take 2 tablets (1,000 mg total) by mouth every 8 (eight) hours for 14 days.  [name] Kitchen amLODipine (NORVASC) 10 MG tablet Take 10 mg by mouth at bedtime.  [name] Kitchen aspirin (ASPIRIN CHILDRENS) 81 MG chewable tablet Chew 1 tablet (81 mg total) by mouth 2 (two) times daily. For 6 weeks for DVT prophylaxis after surgery  . atorvastatin (LIPITOR) 10 MG tablet Take 10 mg by mouth daily.   . celecoxib (CELEBREX) 100 MG capsule Take 1 capsule (100 mg total) by mouth 2 (two) times daily.  [name] Kitchen CINNAMON PO Take  1,000 mg by mouth daily.  . empagliflozin (JARDIANCE) 25 MG TABS tablet Take 1 tablet (25 mg total) by mouth daily before breakfast.  . glucose blood test strip Use as instructed to test blood sugar 2 times daily E11.65    Onetouch strips  . hydrochlorothiazide (HYDRODIURIL) 25 MG tablet Take 25 mg by mouth daily.   . Insulin Glargine (BASAGLAR KWIKPEN) 100 UNIT/ML Inject 0.38 mLs (38 Units total) into the skin at bedtime.  [name] Kitchen losartan (COZAAR) 100 MG tablet Take 100 mg by mouth daily.   . metFORMIN (GLUCOPHAGE) 1000 MG tablet Take 1 tablet (1,000 mg total) by mouth in the morning and at bedtime.  . metoprolol tartrate (LOPRESSOR) 100 MG tablet Take 100 mg by mouth in the morning and at bedtime.  . Multiple Vitamin (MULTIVITAMIN WITH MINERALS) TABS tablet Take 1 tablet by mouth daily.  . nicotine (NICODERM CQ - DOSED IN MG/24 HR) 7 mg/24hr patch Place 7 mg onto the skin daily.  [name] Kitchen omeprazole (PRILOSEC) 20 MG capsule Take 1 capsule (20 mg total) by mouth daily. 30 days for gastroprotection while taking NSAIDs.  [name] Kitchen ondansetron (ZOFRAN) 4 MG tablet Take 1 tablet (4 mg total) by mouth every 8 (eight) hours as needed for up to 7 days for nausea or vomiting.  . Semaglutide,0.25 or 0.5MG /DOS, (OZEMPIC, 0.25 OR 0.5 MG/DOSE,) 2 MG/1.5ML SOPN Inject 0.375 mLs (0.5 mg total) into the skin once a week.  . spironolactone (ALDACTONE) 50 MG tablet Take 50 mg by mouth daily.   . tadalafil (CIALIS) 20 MG tablet Take 20 mg by mouth daily as needed (ED).      Allergies:   Patient has no known allergies.   Social History   Tobacco Use  . Smoking status: Former Smoker    Packs/day: 1.00    Years: 30.00    Pack years: 30.00    Types: Cigarettes    Quit date: 12/25/2019    Years since quitting: 0.7  . Smokeless tobacco: Never Used  Vaping Use  . Vaping Use: Never used  Substance Use Topics  . Alcohol use: Yes    Comment: occasionally  . Drug use: Never     Family Hx: The patient's family history includes  Cancer in his mother; Diabetes in his mother; Hypertension in his mother; Venous thrombosis in his son. There is no history of Colon cancer, Esophageal cancer, Stomach cancer, or Rectal cancer.  ROS:   Please see the history of present illness.     All other systems reviewed and are negative.   Prior CV studies:   The following studies were reviewed today:  Home sleep study, CPAP titration and PAP compliance download  Labs/Other Tests and Data Reviewed:    EKG:  No ECG reviewed.  Recent Labs:  01/25/2020: Hemoglobin 15.6; Platelets 241 08/29/2020: BUN 12; Creatinine, Ser 0.89; Potassium 4.4; Sodium 139   Recent Lipid Panel No results found for: CHOL, TRIG, HDL, CHOLHDL, LDLCALC, LDLDIRECT  Wt Readings from Last 3 Encounters:  09/06/20 267 lb (121.1 kg)  08/31/20 274 lb 11.1 oz (124.6 kg)  08/11/20 277 lb 6.4 oz (125.8 kg)     Risk Assessment/Calculations:      Objective:    Vital Signs:  BP (!) 165/100   Pulse 86   Ht 6\' 2"  (1.88 m)   Wt 267 lb (121.1 kg)   BMI 34.28 kg/m    ASSESSMENT & PLAN:    OSA - The pathophysiology of obstructive sleep apnea , it's cardiovascular consequences & modes of treatment including CPAP were discused with the patient in detail & they evidenced understanding.  The patient is tolerating PAP therapy well without any problems. The PAP download was reviewed today and showed an AHI of 1/hr on 16 cm H2O with 70% compliance in using more than 4 hours nightly.  The patient has been using and benefiting from PAP use and will continue to benefit from therapy.  -encouraged him to read his booklet to learn how to adjust the humidity to help with his mouth dryness  2.  HTN -BP poorly controlled on exam today but he had not taken his meds yet>>repeat this am was 138/19mmHg after sitting down and has not taken his meds.  Normally it is 130/80's. -continue amlodipine 10mg  daily, HCTZ 25mg  daily, Losartan 100mg  daily, Lopressor 100mg  BID and spiro 50mg   daily  3.  Obesity -I have encouraged him to get into a routine exercise program and cut back on carbs and portions.   Shared Decision Making/Informed Consent       COVID-19 Education: The signs and symptoms of COVID-19 were discussed with the patient and how to seek care for testing (follow up with PCP or arrange E-visit).  The importance of social distancing was discussed today.  Time:   Today, I have spent 20 minutes with the patient with telehealth technology discussing the above problems.     Medication Adjustments/Labs and Tests Ordered: Current medicines are reviewed at length with the patient today.  Concerns regarding medicines are outlined above.   Tests Ordered: No orders of the defined types were placed in this encounter.   Medication Changes: No orders of the defined types were placed in this encounter.   Follow Up:  In Person in 1 year(s)  Signed, Fransico Him, MD  09/06/2020 8:44 AM    [place]

## 2020-09-16 ENCOUNTER — Ambulatory Visit: Payer: 59 | Attending: Internal Medicine

## 2020-09-16 DIAGNOSIS — Z23 Encounter for immunization: Secondary | ICD-10-CM

## 2020-09-16 NOTE — Progress Notes (Signed)
   Covid-19 Vaccination Clinic  Name:  DEZMEN ALCOCK    MRN: [id number] DOB: July 29, 1966  09/16/2020  Mr. Dhaliwal was observed post Covid-19 immunization for 15 minutes without incident. He was provided with Vaccine Information Sheet and instruction to access the V-Safe system.   Mr. Ostlund was instructed to call 911 with any severe reactions post vaccine: [name] Kitchen Difficulty breathing  . Swelling of face and throat  . A fast heartbeat  . A bad rash all over body  . Dizziness and weakness   Immunizations Administered    Name Date Dose VIS Date Route   Pfizer COVID-19 Vaccine 09/16/2020  2:56 PM 0.3 mL 08/03/2020 Intramuscular   Manufacturer: [place]   Lot: [id number]   NDC: [address]-3

## 2020-10-03 DIAGNOSIS — G4733 Obstructive sleep apnea (adult) (pediatric): Secondary | ICD-10-CM

## 2020-10-04 ENCOUNTER — Telehealth: Payer: Self-pay | Admitting: Pulmonary Disease

## 2020-10-04 DIAGNOSIS — G4733 Obstructive sleep apnea (adult) (pediatric): Secondary | ICD-10-CM

## 2020-10-04 NOTE — Telephone Encounter (Signed)
No calls were made regarding any results. Per chart pt has a recall for 10/2020 with Dr. Elsworth Soho, it appears that the call was regarding this.   Scheduled rov with pt on 2/3 as there are no available appts with RA in January.   While on the phone pt asked [place] to place an order to [facility] for a new cpap mask. RA ok to place order? Thanks!

## 2020-10-04 NOTE — Telephone Encounter (Signed)
Okay for new CPAP mask. He also follows with Fransico Him from cardiology for sleep apnea -please let him know that he does not need to doctors for the same problem.  Okay to cancel appointment with me if he wants to keep follow-up with Dr. Radford Pax

## 2020-10-05 NOTE — Telephone Encounter (Signed)
Spoke with the pt and notified of recs per Dr Elsworth Soho  He verbalized understanding   He prefers to follow here with [place] for his OSA and will not keep any planned appt with Dr Radford Pax  Order sent to [facility] for mask  Nothing further needed

## 2020-10-21 ENCOUNTER — Telehealth: Payer: Self-pay | Admitting: Pulmonary Disease

## 2020-10-24 NOTE — Telephone Encounter (Signed)
Spoke with the pt  He states his insurance changed and [facility] took his CPAP back b/c they are not in contract with his new ins- [place]  He is wanting [place] to order a new CPAP through another DME  His new insurance not active until 11/15/20  Dr Halford Chessman- is this okay to place the order? Thanks!

## 2020-10-25 NOTE — Telephone Encounter (Signed)
So if his new insurance is not active until 2/1 does he want [place] to wait until then to send in the new order?

## 2020-10-25 NOTE — Telephone Encounter (Signed)
It seems like Dr. Radford Pax from cardiology is managing his CPAP  -will defer to them

## 2020-10-25 NOTE — Telephone Encounter (Signed)
Called and spoke with patient who states that we should wait for his new insurance to start on 11/15/20 so that it doesn't come out of his pocket. Advised patient that we would wait to place the order. Reminder has been set to place order.  Nothing further needed at this time.

## 2020-10-25 NOTE — Telephone Encounter (Signed)
Called and spoke with patient who states that he is no longer seeing Dr. Radford Pax to manage his CPAP and wants our office to do it. Stated that on 10/04/2020, see message below. Last OV with our office was 08/11/20.  Dr. Elsworth Soho please advise  Rosana Berger, CMA     10:26 AM Note Spoke with the pt and notified of recs per Dr Elsworth Soho  He verbalized understanding   He prefers to follow here with [place] for his OSA and will not keep any planned appt with Dr Radford Pax  Order sent to [facility] for mask  Nothing further needed

## 2020-10-25 NOTE — Telephone Encounter (Signed)
This is a patient of Dr. Elsworth Soho.  Will route message to Dr. Elsworth Soho.

## 2020-11-02 ENCOUNTER — Encounter: Payer: Self-pay | Admitting: Internal Medicine

## 2020-11-17 ENCOUNTER — Other Ambulatory Visit: Payer: Self-pay

## 2020-11-17 ENCOUNTER — Encounter: Payer: Self-pay | Admitting: Pulmonary Disease

## 2020-11-17 ENCOUNTER — Ambulatory Visit: Payer: 59 | Admitting: Pulmonary Disease

## 2020-11-17 VITALS — BP 124/74 | HR 86 | Temp 97.8°F | Ht 74.0 in | Wt 270.8 lb

## 2020-11-17 DIAGNOSIS — G4733 Obstructive sleep apnea (adult) (pediatric): Secondary | ICD-10-CM

## 2020-11-17 DIAGNOSIS — Z87891 Personal history of nicotine dependence: Secondary | ICD-10-CM | POA: Diagnosis not present

## 2020-11-17 LAB — HM DIABETES EYE EXAM
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
## 2020-11-17 NOTE — Assessment & Plan Note (Signed)
He has finally quit smoking but remains at risk for relapse. We will refer him to the lung cancer screening program

## 2020-11-17 NOTE — Assessment & Plan Note (Signed)
Since this is a new insurance, we will send in prescription for auto CPAP 10 to 18 cm with a full facemask.  We will check a download on the settings and ensure that this is okay.  CPAP titration study showed requirement of 16 cm.  Weight loss encouraged, compliance with goal of at least 4-6 hrs every night is the expectation. Advised against medications with sedative side effects Cautioned against driving when sleepy - understanding that sleepiness will vary on a day to day basis

## 2020-11-17 NOTE — Patient Instructions (Signed)
  We will send in prescription for auto CPAP 10 to 18 cm to DME. We will check report after 1 month of usage to confirm that settings are working well for you.  Referral to lung cancer screening program

## 2020-11-30 ENCOUNTER — Other Ambulatory Visit: Payer: Self-pay

## 2020-11-30 NOTE — Progress Notes (Signed)
Name: James Parrish  Age/ Sex: 55 y.o., male   MRN/ DOB: [id number], November 21, 1965     PCP: Sueanne Margarita, DO   Reason for Endocrinology Evaluation: Type 2 Diabetes Mellitus  Initial Endocrine Consultative Visit: 07/13/2019    PATIENT IDENTIFIER: James Parrish is a 55 y.o. male with a past medical history of T2Dm, HTN and Dyslipidemia. The patient has followed with Endocrinology clinic since 07/13/2019 for consultative assistance with management of his diabetes.  DIABETIC HISTORY:  James Parrish was diagnosed with DM prior to 2010, was initially on oral glycemic agents, insulin started in 2013. His hemoglobin A1c has ranged from 7.2% in 2018, peaking at 8.8% in 2019.  On his initial visit to our clinic, his A1c 7.7% . He was on lantus, victoza, metformin and Jardiance. We switched Victoza to Ozempic.    He is a truck driver SUBJECTIVE:   During the last visit (05/11/2020): A1c 6.9% . We continued Ozempic, Lantus, Jardiance,and  Metformin  Today (12/01/2020): James Parrish is here for a 4 month follow up on diabetes.  He checks his blood sugars 1 times . The patient has not had hypoglycemic episodes since the last clinic visit.   He has both shoulder sx , recovering well   Denies nausea or diarrhea    HOME DIABETES REGIMEN:  Ozempic 0.5 mg weekly ( Sundays  )  Jardiance 25 mg daily  Metformin 1000 mg Twice daily  Basaglar 38 units daily     DIABETIC COMPLICATIONS: Microvascular complications:   Denies: CKD, retinopathy, neuropathy  Last eye exam: Completed 11/2020  Macrovascular complications:   Denies: CAD, PVD, CVA  METER DOWNLOAD SUMMARY:2/4-2/17/2022 Fingerstick Blood Glucose Tests = 14 Average Number Tests/Day = 1 Overall Mean FS Glucose = 123   BG Ranges: Low = 99 High = 150   Hypoglycemic Events/30 Days: BG < 50 = 0 Episodes of symptomatic severe hypoglycemia = 0    HISTORY:  Past Medical History:  Past Medical History:  Diagnosis Date   . Complication of anesthesia    hard to wake up   . Diabetes mellitus without complication ([place])   . ED (erectile dysfunction)   . History of COVID-19 04/2019  . Hyperlipidemia   . Hypertension   . Sleep apnea    uses CPAP nightly   Past Surgical History:  Past Surgical History:  Procedure Laterality Date  . COLONOSCOPY    . finger surgery Right    Index  . KNEE SURGERY Left   . KNEE SURGERY Right   . ORIF PATELLA Right   . REVERSE SHOULDER ARTHROPLASTY Right 08/31/2020   Procedure: REVERSE SHOULDER ARTHROPLASTY;  Surgeon: Hiram Gash, MD;  Location: [facility];  Service: Orthopedics;  Laterality: Right;  . SHOULDER ARTHROSCOPY WITH ROTATOR CUFF REPAIR Left 02/04/2020   Procedure: SHOULDER ARTHROSCOPY WITH ROTATOR CUFF REPAIR WITH BICEP TENODESIS;  Surgeon: Hiram Gash, MD;  Location: [facility];  Service: Orthopedics;  Laterality: Left;   Social History:  reports that he quit smoking about 11 months ago. His smoking use included cigarettes. He has a 30.00 pack-year smoking history. He has never used smokeless tobacco. He reports current alcohol use. He reports that he does not use drugs. Family History:  Family History  Problem Relation Age of Onset  . Cancer Mother   . Hypertension Mother   . Diabetes Mother   . Venous thrombosis Son   . Colon cancer Neg Hx   .  Esophageal cancer Neg Hx   . Stomach cancer Neg Hx   . Rectal cancer Neg Hx      HOME MEDICATIONS: Allergies as of 12/01/2020   No Known Allergies     Medication List       Accurate as of December 01, 2020  9:20 AM. If you have any questions, ask your nurse or doctor.        STOP taking these medications   Ozempic (0.25 or 0.5 MG/DOSE) 2 MG/1.5ML Sopn Generic drug: Semaglutide(0.25 or 0.5MG /DOS) Stopped by: Dorita Sciara, MD     TAKE these medications   amLODipine 10 MG tablet Commonly known as: NORVASC Take 10 mg by mouth at bedtime.   atorvastatin 10 MG  tablet Commonly known as: LIPITOR Take 10 mg by mouth daily.   Basaglar KwikPen 100 UNIT/ML Inject 0.38 mLs (38 Units total) into the skin at bedtime.   CINNAMON PO Take 1,000 mg by mouth daily.   empagliflozin 25 MG Tabs tablet Commonly known as: Jardiance Take 1 tablet (25 mg total) by mouth daily before breakfast.   glucose blood test strip Use as instructed to test blood sugar 2 times daily E11.65    Onetouch strips   hydrochlorothiazide 25 MG tablet Commonly known as: HYDRODIURIL Take 25 mg by mouth daily.   losartan 100 MG tablet Commonly known as: COZAAR Take 100 mg by mouth daily.   metFORMIN 1000 MG tablet Commonly known as: GLUCOPHAGE Take 1 tablet (1,000 mg total) by mouth in the morning and at bedtime.   metoprolol tartrate 100 MG tablet Commonly known as: LOPRESSOR Take 100 mg by mouth in the morning and at bedtime.   multivitamin with minerals Tabs tablet Take 1 tablet by mouth daily.   nicotine 7 mg/24hr patch Commonly known as: NICODERM CQ - dosed in mg/24 hr Place 7 mg onto the skin daily.   spironolactone 50 MG tablet Commonly known as: ALDACTONE Take 50 mg by mouth daily.   tadalafil 20 MG tablet Commonly known as: CIALIS Take 20 mg by mouth daily as needed (ED).        OBJECTIVE:   Vital Signs: BP (!) 146/90   Pulse 93   Ht 6\' 2"  (1.88 m)   Wt 268 lb 4 oz (121.7 kg)   SpO2 98%   BMI 34.44 kg/m   Wt Readings from Last 3 Encounters:  12/01/20 268 lb 4 oz (121.7 kg)  11/17/20 270 lb 12.8 oz (122.8 kg)  09/06/20 267 lb (121.1 kg)     Exam: General: Pt appears well and is in NAD  Lungs: Clear with good BS bilat with no rales, rhonchi, or wheezes  Heart: RRR with normal S1 and S2 and no gallops; no murmurs; no rub  Abdomen: Normoactive bowel sounds, soft, nontender, without masses or organomegaly palpable  Extremities: No pretibial edema.   Neuro: MS is good with appropriate affect, pt is alert and Ox3    DM foot exam:  12/01/2020 The skin of the feet is intact without sores or ulcerations. The pedal pulses are 2+ on right and 2+ on left. The sensation is intact to a screening 5.07, 10 gram monofilament bilaterally     DATA REVIEWED:  Lab Results  Component Value Date   HGBA1C 7.0 (A) 12/01/2020   HGBA1C 6.9 (A) 05/11/2020   HGBA1C 7.5 (A) 12/25/2019   Lab Results  Component Value Date   CREATININE 0.89 08/29/2020    05/11/2020  Microalb/cr ratio 8.9  LDL  41mg /dL  BUN/Cr 12/0.890  ASSESSMENT / PLAN / RECOMMENDATIONS:   1) Type 2 Diabetes Mellitus, Optimally controlled, Without  complications - Most recent A1c of 7.0 %. Goal A1c < 7.0 %.    - A1c continues to be optimal but its at goal and due to concerns of worsening glucose on the future.  - Will increase Ozempic and decrease insulin as below   - DOT forms have been filled out today    MEDICATIONS: -Increase Ozempic 1.0 mg weekly  -Continue Jardiance 25 mg daily  -Continue Metformin 1000 mg Twice daily  -Decrease  Basaglar 35 units daily    EDUCATION / INSTRUCTIONS: BG monitoring instructions: Patient is instructed to check his blood sugars 1 time a day. Call [facility] if: BG persistently < 70  I reviewed the Rule of 15 for the treatment of hypoglycemia in detail with the patient. Literature supplied.      F/U in 4 months    Signed electronically by: Mack Guise, MD  [facility]  [facility] [address] [place], [place] [address] Phone: [phone] FAX: [phone]   CC: Sueanne Margarita, [place] [address] Phone: [phone]  Fax: [phone]  Return to Endocrinology clinic as below: Future Appointments  Date Time Provider [place]  12/20/2020  2:00 PM Janith Lima, MD [facility] None  05/18/2021  2:30 PM Parrett, Fonnie Mu, NP [facility] None

## 2020-12-01 ENCOUNTER — Ambulatory Visit: Payer: 59 | Admitting: Internal Medicine

## 2020-12-01 ENCOUNTER — Encounter: Payer: Self-pay | Admitting: Internal Medicine

## 2020-12-01 ENCOUNTER — Other Ambulatory Visit: Payer: Self-pay | Admitting: *Deleted

## 2020-12-01 VITALS — BP 146/90 | HR 93 | Ht 74.0 in | Wt 268.2 lb

## 2020-12-01 DIAGNOSIS — E1165 Type 2 diabetes mellitus with hyperglycemia: Secondary | ICD-10-CM | POA: Diagnosis not present

## 2020-12-01 DIAGNOSIS — Z794 Long term (current) use of insulin: Secondary | ICD-10-CM

## 2020-12-01 DIAGNOSIS — Z87891 Personal history of nicotine dependence: Secondary | ICD-10-CM

## 2020-12-01 LAB — POCT GLYCOSYLATED HEMOGLOBIN (HGB A1C): Hemoglobin A1C: 7 % — AB (ref 4.0–5.6)

## 2020-12-01 MED ORDER — OZEMPIC (1 MG/DOSE) 4 MG/3ML ~~LOC~~ SOPN: 1.0000 mg | PEN_INJECTOR | SUBCUTANEOUS | 3 refills | Status: DC

## 2020-12-01 MED ORDER — BASAGLAR KWIKPEN 100 UNIT/ML ~~LOC~~ SOPN
35.0000 [IU] | PEN_INJECTOR | Freq: Every day | SUBCUTANEOUS | 6 refills | Status: DC
Start: 2020-12-01 — End: 2020-12-05

## 2020-12-01 NOTE — Patient Instructions (Addendum)
-   Increase Ozempic 1 mg weekly  - Decrease Basaglar 35 units daily  - Continue Metformin 1000 mg, 1 tablet twice daily  - Continue Jardiance 25 mg, 1 tablet with Breakfast      HOW TO TREAT LOW BLOOD SUGARS (Blood sugar LESS THAN 70 MG/DL)  Please follow the RULE OF 15 for the treatment of hypoglycemia treatment (when your (blood sugars are less than 70 mg/dL)    STEP 1: Take 15 grams of carbohydrates when your blood sugar is low, which includes:   3-4 GLUCOSE TABS  OR  3-4 OZ OF JUICE OR REGULAR SODA OR  ONE TUBE OF GLUCOSE GEL     STEP 2: RECHECK blood sugar in 15 MINUTES STEP 3: If your blood sugar is still low at the 15 minute recheck --> then, go back to STEP 1 and treat AGAIN with another 15 grams of carbohydrates.

## 2020-12-05 ENCOUNTER — Telehealth: Payer: Self-pay | Admitting: Internal Medicine

## 2020-12-05 MED ORDER — TRESIBA FLEXTOUCH 100 UNIT/ML ~~LOC~~ SOPN: 35.0000 [IU] | PEN_INJECTOR | Freq: Every day | SUBCUTANEOUS | 3 refills | Status: DC

## 2020-12-05 NOTE — Telephone Encounter (Signed)
Spoken to patient and notified Dr Shamleffer's comments. Verbalized understanding.   

## 2020-12-05 NOTE — Telephone Encounter (Signed)
Received fax from [employer]. Basaglar 100 units/mL is not cover under patient's insurance.  Formulary alternatives include, but not limited to: Lantus, Elenora Fender, Toujeo  Please advise

## 2020-12-06 ENCOUNTER — Encounter: Payer: Self-pay | Admitting: Internal Medicine

## 2020-12-06 NOTE — Telephone Encounter (Signed)
No, he needs a new prescription for the 1 mg, he needs to  pick it up after he is done with the current pens.

## 2020-12-20 ENCOUNTER — Other Ambulatory Visit: Payer: Self-pay

## 2020-12-20 ENCOUNTER — Encounter: Payer: Self-pay | Admitting: Internal Medicine

## 2020-12-20 ENCOUNTER — Ambulatory Visit (INDEPENDENT_AMBULATORY_CARE_PROVIDER_SITE_OTHER): Payer: 59 | Admitting: Internal Medicine

## 2020-12-20 VITALS — BP 160/102 | HR 90 | Temp 98.3°F | Resp 16 | Ht 74.0 in | Wt 273.0 lb

## 2020-12-20 DIAGNOSIS — Z794 Long term (current) use of insulin: Secondary | ICD-10-CM

## 2020-12-20 DIAGNOSIS — E785 Hyperlipidemia, unspecified: Secondary | ICD-10-CM | POA: Diagnosis not present

## 2020-12-20 DIAGNOSIS — E119 Type 2 diabetes mellitus without complications: Secondary | ICD-10-CM | POA: Diagnosis not present

## 2020-12-20 DIAGNOSIS — I10 Essential (primary) hypertension: Secondary | ICD-10-CM

## 2020-12-20 DIAGNOSIS — Z0001 Encounter for general adult medical examination with abnormal findings: Secondary | ICD-10-CM | POA: Insufficient documentation

## 2020-12-20 MED ORDER — LOSARTAN POTASSIUM 100 MG PO TABS: 100.0000 mg | ORAL_TABLET | Freq: Every day | ORAL | 1 refills | Status: DC

## 2020-12-20 MED ORDER — NEBIVOLOL HCL 20 MG PO TABS: 1.0000 | ORAL_TABLET | Freq: Every day | ORAL | 0 refills | Status: DC

## 2020-12-20 MED ORDER — INDAPAMIDE 1.25 MG PO TABS: 1.2500 mg | ORAL_TABLET | Freq: Every day | ORAL | 0 refills | Status: DC

## 2020-12-20 MED ORDER — SPIRONOLACTONE 50 MG PO TABS: 50.0000 mg | ORAL_TABLET | Freq: Every day | ORAL | 1 refills | Status: DC

## 2020-12-20 MED ORDER — AMLODIPINE BESYLATE 10 MG PO TABS: 10.0000 mg | ORAL_TABLET | Freq: Every day | ORAL | 1 refills | Status: DC

## 2020-12-20 NOTE — Progress Notes (Signed)
Subjective:  Patient ID: James Parrish, male    DOB: 18-Apr-1966  Age: 55 y.o. MRN: [id number]  CC: Hypertension  This visit occurred during the SARS-CoV-2 public health emergency.  Safety protocols were in place, including screening questions prior to the visit, additional usage of staff PPE, and extensive cleaning of exam room while observing appropriate contact time as indicated for disinfecting solutions.    HPI James Parrish presents for establishing.  He has a hx of OSA but is not currently using CPAP - He recently reordered supplies and will restart treatment soon. He tells me that he is complaint with his antihypertensives. He is active and denies CP/DOE/Palpitations/edema/fatigue.  History James Parrish has a past medical history of Complication of anesthesia, Diabetes mellitus without complication ([place]), ED (erectile dysfunction), History of COVID-19 (04/2019), Hyperlipidemia, Hypertension, and Sleep apnea.   He has a past surgical history that includes ORIF patella (Right); finger surgery (Right); Knee surgery (Left); Knee surgery (Right); Colonoscopy; Shoulder arthroscopy with rotator cuff repair (Left, 02/04/2020); and Reverse shoulder arthroplasty (Right, 08/31/2020).   His family history includes Cancer in his mother; Diabetes in his mother; Hypertension in his mother; Venous thrombosis in his son.He reports that he quit smoking about a year ago. His smoking use included cigarettes. He has a 30.00 pack-year smoking history. He has never used smokeless tobacco. He reports current alcohol use. He reports that he does not use drugs.  Outpatient Medications Prior to Visit  Medication Sig Dispense Refill  . atorvastatin (LIPITOR) 10 MG tablet Take 10 mg by mouth daily.     [name] Kitchen CINNAMON PO Take 1,000 mg by mouth daily.    . empagliflozin (JARDIANCE) 25 MG TABS tablet Take 1 tablet (25 mg total) by mouth daily before breakfast. 90 tablet 3  . glucose blood test strip Use as instructed to  test blood sugar 2 times daily E11.65    Onetouch strips 100 each 12  . insulin degludec (TRESIBA FLEXTOUCH) 100 UNIT/ML FlexTouch Pen Inject 35 Units into the skin daily. 30 mL 3  . metFORMIN (GLUCOPHAGE) 1000 MG tablet Take 1 tablet (1,000 mg total) by mouth in the morning and at bedtime. 180 tablet 3  . Multiple Vitamin (MULTIVITAMIN WITH MINERALS) TABS tablet Take 1 tablet by mouth daily.    . Semaglutide, 1 MG/DOSE, (OZEMPIC, 1 MG/DOSE,) 4 MG/3ML SOPN Inject 1 mg into the skin once a week. 9 mL 3  . tadalafil (CIALIS) 20 MG tablet Take 20 mg by mouth daily as needed (ED).     [name] Kitchen amLODipine (NORVASC) 10 MG tablet Take 10 mg by mouth at bedtime.    . hydrochlorothiazide (HYDRODIURIL) 25 MG tablet Take 25 mg by mouth daily.     [name] Kitchen losartan (COZAAR) 100 MG tablet Take 100 mg by mouth daily.     . metoprolol tartrate (LOPRESSOR) 100 MG tablet Take 100 mg by mouth in the morning and at bedtime.    . nicotine (NICODERM CQ - DOSED IN MG/24 HR) 7 mg/24hr patch Place 7 mg onto the skin daily.    [name] Kitchen spironolactone (ALDACTONE) 50 MG tablet Take 50 mg by mouth daily.      No facility-administered medications prior to visit.    ROS Review of Systems  Constitutional: Positive for unexpected weight change (wt gain). Negative for appetite change, chills, diaphoresis and fatigue.  HENT: Negative.   Eyes: Negative for visual disturbance.  Respiratory: Positive for apnea. Negative for cough, shortness of breath and wheezing.   Cardiovascular:  Negative for chest pain, palpitations and leg swelling.  Gastrointestinal: Negative for abdominal pain, diarrhea, nausea and vomiting.  Endocrine: Negative.   Genitourinary: Negative.  Negative for difficulty urinating and dysuria.  Musculoskeletal: Negative.  Negative for arthralgias and myalgias.  Skin: Negative.   Neurological: Negative.  Negative for dizziness, weakness and light-headedness.  Hematological: Negative for adenopathy. Does not bruise/bleed easily.   Psychiatric/Behavioral: Negative.     Objective:  BP (!) 160/102   Pulse 90   Temp 98.3 F (36.8 C) (Oral)   Resp 16   Ht 6\' 2"  (1.88 m)   Wt 273 lb (123.8 kg)   SpO2 98%   BMI 35.05 kg/m   Physical Exam Vitals reviewed.  Constitutional:      Appearance: He is obese.  HENT:     Nose: Nose normal.     Mouth/Throat:     Mouth: Mucous membranes are moist.  Eyes:     General: No scleral icterus.    Conjunctiva/sclera: Conjunctivae normal.  Cardiovascular:     Rate and Rhythm: Normal rate and regular rhythm.     Heart sounds: No murmur heard.     Comments: EKG- NSR 90 bpm TWI in lateral leads - no change from the prior EKG No LVH or Q waves Pulmonary:     Effort: Pulmonary effort is normal.     Breath sounds: No stridor. No wheezing, rhonchi or rales.  Abdominal:     General: Abdomen is protuberant. Bowel sounds are normal. There is no distension.     Palpations: Abdomen is soft. There is no hepatomegaly, splenomegaly or mass.     Tenderness: There is no abdominal tenderness.  Musculoskeletal:     Cervical back: Neck supple.     Right lower leg: No edema.     Left lower leg: No edema.  Lymphadenopathy:     Cervical: No cervical adenopathy.  Skin:    General: Skin is warm and dry.     Coloration: Skin is not pale.  Neurological:     General: No focal deficit present.     Mental Status: He is alert.  Psychiatric:        Mood and Affect: Mood normal.        Behavior: Behavior normal.     Lab Results  Component Value Date   WBC 9.6 01/25/2020   HGB 15.6 01/25/2020   HCT 49.5 01/25/2020   PLT 241 01/25/2020   GLUCOSE 115 (H) 08/29/2020   CHOL 125 05/11/2020   TRIG 275 (A) 05/11/2020   HDL 29 (A) 05/11/2020   LDLCALC 41 05/11/2020   NA 139 08/29/2020   K 4.4 08/29/2020   CL 104 08/29/2020   CREATININE 0.89 08/29/2020   BUN 12 08/29/2020   CO2 25 08/29/2020   TSH 0.90 05/11/2020   PSA 0.374 05/11/2020   HGBA1C 7.0 (A) 12/01/2020   MICROALBUR 5.0  05/11/2020      Assessment & Plan:   James Parrish was seen today for hypertension.  Diagnoses and all orders for this visit:  Type 2 diabetes mellitus without complication, with long-term current use of insulin ([place])- His last A1C was 7.0%. His blood sugar is adequately well controlled. -     losartan (COZAAR) 100 MG tablet; Take 1 tablet (100 mg total) by mouth daily. -     HM Diabetes Foot Exam  Hyperlipidemia with target LDL less than 100- He has achieved his LDL goal and is doing well on the statin.  Encounter for  general adult medical examination with abnormal findings  Primary hypertension- His BP is not well controlled. Will upgrade to a more potent thiazide and Beta-blocker. Will continue the current doses of the other meds. He will restart the use of CPAP. -     EKG 12-Lead -     amLODipine (NORVASC) 10 MG tablet; Take 1 tablet (10 mg total) by mouth at bedtime. -     losartan (COZAAR) 100 MG tablet; Take 1 tablet (100 mg total) by mouth daily. -     spironolactone (ALDACTONE) 50 MG tablet; Take 1 tablet (50 mg total) by mouth daily. -     Nebivolol HCl 20 MG TABS; Take 1 tablet (20 mg total) by mouth daily. -     indapamide (LOZOL) 1.25 MG tablet; Take 1 tablet (1.25 mg total) by mouth daily.   I have discontinued Laurier Jasperson. [place]worth's hydrochlorothiazide, metoprolol tartrate, and nicotine. I have also changed his amLODipine, losartan, and spironolactone. Additionally, I am having him start on Nebivolol HCl and indapamide. Lastly, I am having him maintain his tadalafil, atorvastatin, glucose blood, multivitamin with minerals, CINNAMON PO, metFORMIN, empagliflozin, Ozempic (1 MG/DOSE), and Tresiba FlexTouch.  Meds ordered this encounter  Medications  . amLODipine (NORVASC) 10 MG tablet    Sig: Take 1 tablet (10 mg total) by mouth at bedtime.    Dispense:  90 tablet    Refill:  1  . losartan (COZAAR) 100 MG tablet    Sig: Take 1 tablet (100 mg total) by mouth daily.    Dispense:  90  tablet    Refill:  1  . spironolactone (ALDACTONE) 50 MG tablet    Sig: Take 1 tablet (50 mg total) by mouth daily.    Dispense:  90 tablet    Refill:  1  . Nebivolol HCl 20 MG TABS    Sig: Take 1 tablet (20 mg total) by mouth daily.    Dispense:  90 tablet    Refill:  0  . indapamide (LOZOL) 1.25 MG tablet    Sig: Take 1 tablet (1.25 mg total) by mouth daily.    Dispense:  90 tablet    Refill:  0     Follow-up: Return in about 6 weeks (around 01/31/2021).  Scarlette Calico, MD

## 2020-12-20 NOTE — Patient Instructions (Signed)

## 2020-12-21 ENCOUNTER — Telehealth: Payer: Self-pay

## 2020-12-21 NOTE — Telephone Encounter (Signed)
Key: [id number]

## 2020-12-22 ENCOUNTER — Telehealth: Payer: Self-pay | Admitting: Pulmonary Disease

## 2020-12-22 NOTE — Telephone Encounter (Signed)
Pt states [facility] took his cpap machine because they are not in-network with [facility].  They told him [facility] could take him.  They are in-network with [facility].  I gave him [facility]'s phone # so he would have it and am sending cpap order to them.  Nothing further needed.

## 2020-12-30 ENCOUNTER — Telehealth: Payer: Self-pay | Admitting: Pulmonary Disease

## 2020-12-30 ENCOUNTER — Encounter: Payer: Self-pay | Admitting: Internal Medicine

## 2020-12-30 NOTE — Telephone Encounter (Addendum)
Called pt about sending order to [facility].  He states he has someone on hold now from [facility] [employer] & he wants order sent to them.  He is going to pay for it himself.  He was going to go back & finish talking to them and then call me with fax #.

## 2020-12-30 NOTE — Telephone Encounter (Signed)
Pt called back & states to fax order to [facility] - fax # [phone], order # [id number].  Order faxed. Nothing further needed.

## 2021-01-03 NOTE — Telephone Encounter (Signed)
Approved  01/02/21-01/01/22  Determination sent to scan.

## 2021-01-04 ENCOUNTER — Other Ambulatory Visit: Payer: Self-pay

## 2021-01-04 ENCOUNTER — Ambulatory Visit (INDEPENDENT_AMBULATORY_CARE_PROVIDER_SITE_OTHER): Payer: 59 | Admitting: Acute Care

## 2021-01-04 ENCOUNTER — Encounter: Payer: Self-pay | Admitting: Acute Care

## 2021-01-04 ENCOUNTER — Ambulatory Visit
Admission: RE | Admit: 2021-01-04 | Discharge: 2021-01-04 | Disposition: A | Discharge status: Home / Self Care | Payer: 59 | Source: Ambulatory Visit | Attending: Acute Care | Admitting: Acute Care

## 2021-01-04 VITALS — BP 130/80 | HR 84 | Temp 97.3°F | Ht 74.0 in | Wt 270.4 lb

## 2021-01-04 DIAGNOSIS — Z87891 Personal history of nicotine dependence: Secondary | ICD-10-CM | POA: Diagnosis not present

## 2021-01-04 DIAGNOSIS — Z122 Encounter for screening for malignant neoplasm of respiratory organs: Secondary | ICD-10-CM

## 2021-01-04 NOTE — Patient Instructions (Signed)
Thank you for participating in the [facility]. It was our pleasure to meet you today. We will call you with the results of your scan within the next few days. Your scan will be assigned a Lung RADS category score by the physicians reading the scans.  This Lung RADS score determines follow up scanning.  See below for description of categories, and follow up screening recommendations. We will be in touch to schedule your follow up screening annually or based on recommendations of our providers. We will fax a copy of your scan results to your Primary Care Physician, or the physician who referred you to the program, to ensure they have the results. Please call the office if you have any questions or concerns regarding your scanning experience or results.  Our office number is [phone]. Please speak with Denise Phelps, RN. She is our Lung Cancer Screening RN. If she is unavailable when you call, please have the office staff send her a message. She will return your call at her earliest convenience. Remember, if your scan is normal, we will scan you annually as long as you continue to meet the criteria for the program. (Age 55-77, Current smoker or smoker who has quit within the last 15 years). If you are a smoker, remember, quitting is the single most powerful action that you can take to decrease your risk of lung cancer and other pulmonary, breathing related problems. We know quitting is hard, and we are here to help.  Please let us know if there is anything we can do to help you meet your goal of quitting. If you are a former smoker, congratulations. We are proud of you! Remain smoke free! Remember you can refer friends or family members through the number above.  We will screen them to make sure they meet criteria for the program. Thank you for helping us take better care of you by participating in Lung Screening.  Lung RADS Categories:  Lung RADS 1: no nodules  or definitely non-concerning nodules.  Recommendation is for a repeat annual scan in 12 months.  Lung RADS 2:  nodules that are non-concerning in appearance and behavior with a very low likelihood of becoming an active cancer. Recommendation is for a repeat annual scan in 12 months.  Lung RADS 3: nodules that are probably non-concerning , includes nodules with a low likelihood of becoming an active cancer.  Recommendation is for a 6-month repeat screening scan. Often noted after an upper respiratory illness. We will be in touch to make sure you have no questions, and to schedule your 6-month scan.  Lung RADS 4 A: nodules with concerning findings, recommendation is most often for a follow up scan in 3 months or additional testing based on our provider's assessment of the scan. We will be in touch to make sure you have no questions and to schedule the recommended 3 month follow up scan.  Lung RADS 4 B:  indicates findings that are concerning. We will be in touch with you to schedule additional diagnostic testing based on our provider's  assessment of the scan.   

## 2021-01-04 NOTE — Progress Notes (Signed)
Shared Decision Making Visit Lung Cancer Screening Program [id number])   Eligibility:  Age 55 y.o.  Pack Years Smoking History Calculation 30 pack years (# packs/per year x # years smoked)  Recent History of coughing up blood  no  Unexplained weight loss? no ( >Than 15 pounds within the last 6 months )  Prior History Lung / other cancer no (Diagnosis within the last 5 years already requiring surveillance chest CT Scans).  Smoking Status Former Smoker  Former Smokers: Years since quit: < 1 year  Quit Date: 04/2020  Visit Components:  Discussion included one or more decision making aids. yes  Discussion included risk/benefits of screening. yes  Discussion included potential follow up diagnostic testing for abnormal scans. yes  Discussion included meaning and risk of over diagnosis. yes  Discussion included meaning and risk of False Positives. yes  Discussion included meaning of total radiation exposure. yes  Counseling Included:  Importance of adherence to annual lung cancer LDCT screening. yes  Impact of comorbidities on ability to participate in the program. yes  Ability and willingness to under diagnostic treatment. yes  Smoking Cessation Counseling:  Current Smokers:   Discussed importance of smoking cessation. yes  Information about tobacco cessation classes and interventions provided to patient. yes  Patient provided with "ticket" for LDCT Scan. yes  Symptomatic Patient. no  Counseling NA  Diagnosis Code: Tobacco Use Z72.0  Asymptomatic Patient yes  Counseling (Intermediate counseling: > three minutes counseling) L4650  Former Smokers:   Discussed the importance of maintaining cigarette abstinence. yes  Diagnosis Code: Personal History of Nicotine Dependence. P54.656  Information about tobacco cessation classes and interventions provided to patient. Yes  Patient provided with "ticket" for LDCT Scan. yes  Written Order for Lung Cancer Screening  with LDCT placed in Epic. Yes (CT Chest Lung Cancer Screening Low Dose W/O CM) [id number] Z12.2-Screening of respiratory organs Z87.891-Personal history of nicotine dependence  BP 130/80 (BP Location: Left Arm, Cuff Size: Normal)   Pulse 84   Temp (!) 97.3 F (36.3 C) (Oral)   Ht 6\' 2"  (1.88 m)   Wt 270 lb 6.4 oz (122.7 kg)   SpO2 96%   BMI 34.72 kg/m    I spent 25 minutes of face to face time with Mr. Colaizzi discussing the risks and benefits of lung cancer screening. We viewed a power point together that explained in detail the above noted topics. We took the time to pause the power point at intervals to allow for questions to be asked and answered to ensure understanding. We discussed that he had taken the single most powerful action possible to decrease his risk of developing lung cancer when he quit smoking. I counseled him to remain smoke free, and to contact me if he ever had the desire to smoke again so that I can provide resources and tools to help support the effort to remain smoke free. We discussed the time and location of the scan, and that either  Doroteo Glassman RN or I will call with the results within  24-48 hours of receiving them. He has my card and contact information in the event he needs to speak with me, in addition to a copy of the power point we reviewed as a resource. He verbalized understanding of all of the above and had no further questions upon leaving the office.     I explained to the patient that there has been a high incidence of coronary artery disease noted on these exams. I  explained that this is a non-gated exam therefore degree or severity cannot be determined. This patient is currently on statin therapy. I have asked the patient to follow-up with their PCP regarding any incidental finding of coronary artery disease and management with diet or medication as they feel is clinically indicated. The patient verbalized understanding of the above and had no further  questions.     Magdalen Spatz, NP 01/04/2021

## 2021-01-09 ENCOUNTER — Telehealth: Payer: Self-pay | Admitting: Pulmonary Disease

## 2021-01-09 NOTE — Telephone Encounter (Signed)
Please obtain a download from his CPAP machine and we can provide him letter on that basis

## 2021-01-09 NOTE — Telephone Encounter (Signed)
LMTCB

## 2021-01-09 NOTE — Telephone Encounter (Signed)
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

## 2021-01-10 NOTE — Telephone Encounter (Signed)
Pt came into the office asking for letter  Advised need DL done first  He will bring chip from CP[employer] 01/11/21 so we can obtain this

## 2021-01-11 NOTE — Telephone Encounter (Signed)
Letter typed and placed up front behind check in desk. Pt will pick up on Monday 01/16/21. Front staff please schedule pt with NP in 4 weeks to f/u on C-PAP. Nothing further needed at this time.

## 2021-01-11 NOTE — Telephone Encounter (Signed)
Pt brought in [place] card. Download given to Dr. Elsworth Soho as well as message routed to him.

## 2021-01-11 NOTE — Telephone Encounter (Signed)
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

## 2021-01-24 NOTE — Progress Notes (Signed)
Langley Gauss, I will call the patient with his results.  I will let you know if we need to do a PET scan.   Dr. Valeta Harms and Lamonte Sakai This is the patient with the mediastinal mass. Let me know your thoughts

## 2021-01-26 ENCOUNTER — Telehealth: Payer: Self-pay | Admitting: Acute Care

## 2021-01-26 DIAGNOSIS — J9859 Other diseases of mediastinum, not elsewhere classified: Secondary | ICD-10-CM

## 2021-01-26 NOTE — Progress Notes (Signed)
I have called the patient with the results of his low dose CT. I explained that from a lung cancer perspective, his scan was read as a Lung RADS 2: nodules that are benign in appearance and behavior with a very low likelihood of becoming a clinically active cancer due to size or lack of growth. Recommendation per radiology is for a repeat LDCT in 12 months. I explained that there was a concerning mass noted In his chest. I asked him if he has had any trouble swallowing. He states he has not. I told him we will order a PET scan to better evaluate the mass, and that once we have the results of the PET scan we can better determine what we are dealing with.  He is in agreement with this plan. I gave him the office number to ensure he can call and ask questions if he needs to. I explained that I am placing the order for the PET scan now, and that he should get a call to get this scheduled today. He verbalized understanding of the above and had no further questions .

## 2021-01-26 NOTE — Telephone Encounter (Signed)
I have called the patient with the results of his low dose CT. I explained that from a lung cancer perspective, his scan was read as a Lung RADS 2: nodules that are benign in appearance and behavior with a very low likelihood of becoming a clinically active cancer due to size or lack of growth. Recommendation per radiology is for a repeat LDCT in 12 months. I explained that there was a concerning mass noted In his chest. I asked him if he has had any trouble swallowing. He states he has not. I told him we will order a PET scan to better evaluate the mass, and that once we have the results of the PET scan we can better determine what we are dealing with.  He is in agreement with this plan. I gave him the office number to ensure he can call and ask questions if he needs to. I explained that I am placing the order for the PET scan now, and that he should get a call to get this scheduled today. He verbalized understanding of the above and had no further questions .

## 2021-01-30 ENCOUNTER — Other Ambulatory Visit: Payer: Self-pay

## 2021-01-31 ENCOUNTER — Other Ambulatory Visit: Payer: Self-pay

## 2021-01-31 ENCOUNTER — Encounter: Payer: Self-pay | Admitting: Internal Medicine

## 2021-01-31 ENCOUNTER — Ambulatory Visit (INDEPENDENT_AMBULATORY_CARE_PROVIDER_SITE_OTHER): Payer: 59 | Admitting: Internal Medicine

## 2021-01-31 VITALS — BP 136/88 | HR 92 | Temp 98.5°F | Resp 16 | Ht 74.0 in | Wt 276.0 lb

## 2021-01-31 DIAGNOSIS — E291 Testicular hypofunction: Secondary | ICD-10-CM | POA: Diagnosis not present

## 2021-01-31 DIAGNOSIS — N5201 Erectile dysfunction due to arterial insufficiency: Secondary | ICD-10-CM | POA: Insufficient documentation

## 2021-01-31 DIAGNOSIS — M545 Low back pain, unspecified: Secondary | ICD-10-CM | POA: Diagnosis not present

## 2021-01-31 MED ORDER — ETODOLAC ER 400 MG PO TB24: 400.0000 mg | ORAL_TABLET | Freq: Every day | ORAL | 0 refills | Status: DC

## 2021-01-31 NOTE — Patient Instructions (Signed)
Acute Back Pain, Adult Acute back pain is sudden and usually short-lived. It is often caused by an injury to the muscles and tissues in the back. The injury may result from:  A muscle or ligament getting overstretched or torn (strained). Ligaments are tissues that connect bones to each other. Lifting something improperly can cause a back strain.  Wear and tear (degeneration) of the spinal disks. Spinal disks are circular tissue that provide cushioning between the bones of the spine (vertebrae).  Twisting motions, such as while playing sports or doing yard work.  A hit to the back.  Arthritis. You may have a physical exam, lab tests, and imaging tests to find the cause of your pain. Acute back pain usually goes away with rest and home care. Follow these instructions at home: Managing pain, stiffness, and swelling  Treatment may include medicines for pain and inflammation that are taken by mouth or applied to the skin, prescription pain medicine, or muscle relaxants. Take over-the-counter and prescription medicines only as told by your health care provider.  Your health care provider may recommend applying ice during the first 24-48 hours after your pain starts. To do this: ? Put ice in a plastic bag. ? Place a towel between your skin and the bag. ? Leave the ice on for 20 minutes, 2-3 times a day.  If directed, apply heat to the affected area as often as told by your health care provider. Use the heat source that your health care provider recommends, such as a moist heat pack or a heating pad. ? Place a towel between your skin and the heat source. ? Leave the heat on for 20-30 minutes. ? Remove the heat if your skin turns bright red. This is especially important if you are unable to feel pain, heat, or cold. You have a greater risk of getting burned. Activity  Do not stay in bed. Staying in bed for more than 1-2 days can delay your recovery.  Sit up and stand up straight. Avoid leaning  forward when you sit or hunching over when you stand. ? If you work at a desk, sit close to it so you do not need to lean over. Keep your chin tucked in. Keep your neck drawn back, and keep your elbows bent at a 90-degree angle (right angle). ? Sit high and close to the steering wheel when you drive. Add lower back (lumbar) support to your car seat, if needed.  Take short walks on even surfaces as soon as you are able. Try to increase the length of time you walk each day.  Do not sit, drive, or stand in one place for more than 30 minutes at a time. Sitting or standing for long periods of time can put stress on your back.  Do not drive or use heavy machinery while taking prescription pain medicine.  Use proper lifting techniques. When you bend and lift, use positions that put less stress on your back: ? Bend your knees. ? Keep the load close to your body. ? Avoid twisting.  Exercise regularly as told by your health care provider. Exercising helps your back heal faster and helps prevent back injuries by keeping muscles strong and flexible.  Work with a physical therapist to make a safe exercise program, as recommended by your health care provider. Do any exercises as told by your physical therapist.   Lifestyle  Maintain a healthy weight. Extra weight puts stress on your back and makes it difficult to have   good posture.  Avoid activities or situations that make you feel anxious or stressed. Stress and anxiety increase muscle tension and can make back pain worse. Learn ways to manage anxiety and stress, such as through exercise. General instructions  Sleep on a firm mattress in a comfortable position. Try lying on your side with your knees slightly bent. If you lie on your back, put a pillow under your knees.  Follow your treatment plan as told by your health care provider. This may include: ? Cognitive or behavioral therapy. ? Acupuncture or massage therapy. ? Meditation or yoga. Contact  a health care provider if:  You have pain that is not relieved with rest or medicine.  You have increasing pain going down into your legs or buttocks.  Your pain does not improve after 2 weeks.  You have pain at night.  You lose weight without trying.  You have a fever or chills. Get help right away if:  You develop new bowel or bladder control problems.  You have unusual weakness or numbness in your arms or legs.  You develop nausea or vomiting.  You develop abdominal pain.  You feel faint. Summary  Acute back pain is sudden and usually short-lived.  Use proper lifting techniques. When you bend and lift, use positions that put less stress on your back.  Take over-the-counter and prescription medicines and apply heat or ice as directed by your health care provider. This information is not intended to replace advice given to you by your health care provider. Make sure you discuss any questions you have with your health care provider. Document Revised: 06/24/2020 Document Reviewed: 06/24/2020 Elsevier Patient Education  2021 [employer] Inc[employer]  

## 2021-01-31 NOTE — Progress Notes (Signed)
Subjective:  Patient ID: James Parrish, male    DOB: 07-14-1966  Age: 55 y.o. MRN: [id number]  CC: Hypertension and Back Pain  This visit occurred during the SARS-CoV-2 public health emergency.  Safety protocols were in place, including screening questions prior to the visit, additional usage of staff PPE, and extensive cleaning of exam room while observing appropriate contact time as indicated for disinfecting solutions.    HPI [name] presents for f/up -  He complains of ED and that cialis is not effective.  He also complains of recurrence of low back pain.  He has had this before.  He denies any recent trauma or injury.  The pain does not radiate into his extremities and he denies paresthesias.  He tells me his blood pressure is much better.  He denies dizziness, lightheadedness, chest pain, shortness of breath, or diaphoresis.  Outpatient Medications Prior to Visit  Medication Sig Dispense Refill  . amLODipine (NORVASC) 10 MG tablet Take 1 tablet (10 mg total) by mouth at bedtime. 90 tablet 1  . atorvastatin (LIPITOR) 10 MG tablet Take 10 mg by mouth daily.     [name] Kitchen CINNAMON PO Take 1,000 mg by mouth daily.    . empagliflozin (JARDIANCE) 25 MG TABS tablet Take 1 tablet (25 mg total) by mouth daily before breakfast. 90 tablet 3  . glucose blood test strip Use as instructed to test blood sugar 2 times daily E11.65    Onetouch strips 100 each 12  . indapamide (LOZOL) 1.25 MG tablet Take 1 tablet (1.25 mg total) by mouth daily. 90 tablet 0  . insulin degludec (TRESIBA FLEXTOUCH) 100 UNIT/ML FlexTouch Pen Inject 35 Units into the skin daily. 30 mL 3  . losartan (COZAAR) 100 MG tablet Take 1 tablet (100 mg total) by mouth daily. 90 tablet 1  . metFORMIN (GLUCOPHAGE) 1000 MG tablet Take 1 tablet (1,000 mg total) by mouth in the morning and at bedtime. 180 tablet 3  . Multiple Vitamin (MULTIVITAMIN WITH MINERALS) TABS tablet Take 1 tablet by mouth daily.    . Nebivolol HCl 20 MG TABS Take  1 tablet (20 mg total) by mouth daily. 90 tablet 0  . Semaglutide, 1 MG/DOSE, (OZEMPIC, 1 MG/DOSE,) 4 MG/3ML SOPN Inject 1 mg into the skin once a week. 9 mL 3  . spironolactone (ALDACTONE) 50 MG tablet Take 1 tablet (50 mg total) by mouth daily. 90 tablet 1  . tadalafil (CIALIS) 20 MG tablet Take 20 mg by mouth daily as needed (ED).      No facility-administered medications prior to visit.    ROS Review of Systems  Constitutional: Negative.  Negative for chills, diaphoresis, fatigue and fever.  HENT: Negative.   Eyes: Negative.   Respiratory: Negative for cough, chest tightness, shortness of breath and wheezing.   Cardiovascular: Negative for chest pain, palpitations and leg swelling.  Gastrointestinal: Negative for abdominal pain, constipation, diarrhea, nausea and vomiting.  Endocrine: Negative.   Genitourinary: Negative.  Negative for difficulty urinating and dysuria.  Musculoskeletal: Positive for back pain. Negative for myalgias.  Skin: Negative for color change and pallor.  Neurological: Negative.  Negative for dizziness, weakness, light-headedness, numbness and headaches.  Hematological: Negative for adenopathy. Does not bruise/bleed easily.  Psychiatric/Behavioral: Negative.     Objective:  BP 136/88 (BP Location: Right Arm, Patient Position: Sitting, Cuff Size: Large)   Pulse 92   Temp 98.5 F (36.9 C) (Oral)   Resp 16   Ht 6\' 2"  (1.88  m)   Wt 276 lb (125.2 kg)   SpO2 97%   BMI 35.44 kg/m   BP Readings from Last 3 Encounters:  01/31/21 136/88  01/04/21 130/80  12/20/20 (!) 160/102    Wt Readings from Last 3 Encounters:  01/31/21 276 lb (125.2 kg)  01/04/21 270 lb 6.4 oz (122.7 kg)  12/20/20 273 lb (123.8 kg)    Physical Exam Vitals reviewed.  HENT:     Nose: Nose normal.     Mouth/Throat:     Mouth: Mucous membranes are moist.  Eyes:     General: No scleral icterus.    Conjunctiva/sclera: Conjunctivae normal.  Cardiovascular:     Rate and Rhythm:  Normal rate and regular rhythm.     Heart sounds: No murmur heard.   Pulmonary:     Effort: Pulmonary effort is normal.     Breath sounds: No stridor. No wheezing, rhonchi or rales.  Abdominal:     General: Abdomen is flat. Bowel sounds are normal. There is no distension.     Palpations: Abdomen is soft. There is no hepatomegaly, splenomegaly or mass.  Musculoskeletal:        General: Normal range of motion.     Cervical back: Normal and neck supple.     Thoracic back: Normal.     Lumbar back: No swelling, deformity, tenderness or bony tenderness. Normal range of motion. Negative right straight leg raise test and negative left straight leg raise test.     Right lower leg: No edema.     Left lower leg: No edema.  Lymphadenopathy:     Cervical: No cervical adenopathy.  Skin:    General: Skin is warm and dry.  Neurological:     General: No focal deficit present.     Mental Status: He is alert.     Lab Results  Component Value Date   WBC 9.6 01/25/2020   HGB 15.6 01/25/2020   HCT 49.5 01/25/2020   PLT 241 01/25/2020   GLUCOSE 115 (H) 08/29/2020   CHOL 125 05/11/2020   TRIG 275 (A) 05/11/2020   HDL 29 (A) 05/11/2020   LDLCALC 41 05/11/2020   NA 139 08/29/2020   K 4.4 08/29/2020   CL 104 08/29/2020   CREATININE 0.89 08/29/2020   BUN 12 08/29/2020   CO2 25 08/29/2020   TSH 0.90 05/11/2020   PSA 0.374 05/11/2020   HGBA1C 7.0 (A) 12/01/2020   MICROALBUR 5.0 05/11/2020    CT CHEST LUNG CA SCREEN LOW DOSE W/O CM  Result Date: 01/05/2021 CLINICAL DATA:  55 year old male former smoker (quit 1 year ago) with 30 pack-year history of smoking. Lung cancer screening examination. EXAM: CT CHEST WITHOUT CONTRAST LOW-DOSE FOR LUNG CANCER SCREENING TECHNIQUE: Multidetector CT imaging of the chest was performed following the standard protocol without IV contrast. COMPARISON:  No priors. FINDINGS: Cardiovascular: Heart size is normal. There is no significant pericardial fluid, thickening  or pericardial calcification. No atherosclerotic calcifications in the thoracic aorta or the coronary arteries. Mediastinum/Nodes: Large soft tissue mass extending into the middle mediastinum along the left paratracheal region measuring 5.1 x 3.6 x 8.5 cm. This is intimately associated with the left lobe of the thyroid gland, however, on the coronal reconstructions, there does appear to be a potential intervening fat plane. Several other prominent but nonenlarged mediastinal lymph nodes are noted, largest of which measures up to 1.2 cm in the prevascular nodal station. Esophagus is unremarkable in appearance. No axillary lymphadenopathy. Esophagus is unremarkable in  appearance. Multiple borderline enlarged bilateral axillary lymph nodes with enlarged right subpectoral lymph node measuring 2 cm (axial image 12 of series 2). Lungs/Pleura: Tiny pulmonary nodule in the medial aspect of the right lower lobe (axial image 232 of series 3), with a volume derived mean diameter of 2.0 mm. No other larger more suspicious appearing pulmonary nodules or masses are noted. No acute consolidative airspace disease. No pleural effusions. Diffuse bronchial wall thickening with very mild centrilobular and paraseptal emphysema Upper Abdomen: Mild diffuse low attenuation throughout the hepatic parenchyma, indicative of hepatic steatosis. Musculoskeletal: Status post right shoulder arthroplasty. There are no aggressive appearing lytic or blastic lesions noted in the visualized portions of the skeleton. IMPRESSION: 1. Lung-RADS 2S, benign appearance or behavior. Continue annual screening with low-dose chest CT without contrast in 12 months. 2. The "S" modifier above refers to potentially clinically significant non lung cancer related findings. Specifically, lymphadenopathy noted in the mediastinal, axillary and subpectoral nodal stations, as above. Most significantly, there is what appears to be a large middle mediastinal mass measuring  5.1 x 3.6 x 8.5 cm in the left paratracheal nodal station. Underlying lymphoproliferative disease is suspected. Further evaluation with PET-CT should be considered. 3. Mild diffuse bronchial wall thickening with very mild centrilobular and paraseptal emphysema; imaging findings suggestive of underlying COPD. 4. Hepatic steatosis. Emphysema (ICD10-J43.9). Electronically Signed   By: Vinnie Langton M.D.   On: 01/05/2021 11:05    Assessment & Plan:   Suleman was seen today for hypertension and back pain.  Diagnoses and all orders for this visit:  Acute bilateral low back pain without sciatica -     etodolac (LODINE XL) 400 MG 24 hr tablet; Take 1 tablet (400 mg total) by mouth daily.  Erectile dysfunction due to arterial insufficiency- I recommended that he treat the low T. -     Testosterone Total,Free,Bio, Males; Future -     Testosterone Total,Free,Bio, Males  Hypogonadism male -     Testosterone 20.25 MG/ACT (1.62%) GEL; Place 2 Act onto the skin daily.   I am having Princella Pellegrini start on etodolac and Testosterone. I am also having him maintain his tadalafil, atorvastatin, glucose blood, multivitamin with minerals, CINNAMON PO, metFORMIN, empagliflozin, Ozempic (1 MG/DOSE), Tresiba FlexTouch, amLODipine, losartan, spironolactone, Nebivolol HCl, and indapamide.  Meds ordered this encounter  Medications  . etodolac (LODINE XL) 400 MG 24 hr tablet    Sig: Take 1 tablet (400 mg total) by mouth daily.    Dispense:  30 tablet    Refill:  0  . Testosterone 20.25 MG/ACT (1.62%) GEL    Sig: Place 2 Act onto the skin daily.    Dispense:  225 g    Refill:  1     Follow-up: Return in about 3 months (around 05/02/2021).  Scarlette Calico, MD

## 2021-02-01 ENCOUNTER — Telehealth: Payer: Self-pay

## 2021-02-01 DIAGNOSIS — E291 Testicular hypofunction: Secondary | ICD-10-CM | POA: Insufficient documentation

## 2021-02-01 LAB — TESTOSTERONE TOTAL,FREE,BIO, MALES
Albumin: 4.7 g/dL (ref 3.6–5.1)
Sex Hormone Binding: 13 nmol/L — ABNORMAL LOW (ref 22–77)
Testosterone: 131 ng/dL — ABNORMAL LOW (ref 250–827)

## 2021-02-01 MED ORDER — TESTOSTERONE 20.25 MG/ACT (1.62%) TD GEL: 2.0000 | Freq: Every day | TRANSDERMAL | 1 refills | Status: DC

## 2021-02-01 NOTE — Telephone Encounter (Signed)
Approved 02/01/2021 to 01/31/2022

## 2021-02-01 NOTE — Telephone Encounter (Signed)
Key: [id number]

## 2021-02-06 ENCOUNTER — Other Ambulatory Visit: Payer: Self-pay

## 2021-02-06 ENCOUNTER — Ambulatory Visit
Admission: RE | Admit: 2021-02-06 | Discharge: 2021-02-06 | Disposition: A | Discharge status: Home / Self Care | Payer: 59 | Source: Ambulatory Visit | Attending: Acute Care | Admitting: Acute Care

## 2021-02-06 DIAGNOSIS — R590 Localized enlarged lymph nodes: Secondary | ICD-10-CM | POA: Diagnosis not present

## 2021-02-06 DIAGNOSIS — I700 Atherosclerosis of aorta: Secondary | ICD-10-CM | POA: Insufficient documentation

## 2021-02-06 DIAGNOSIS — K760 Fatty (change of) liver, not elsewhere classified: Secondary | ICD-10-CM | POA: Insufficient documentation

## 2021-02-06 DIAGNOSIS — J9859 Other diseases of mediastinum, not elsewhere classified: Secondary | ICD-10-CM | POA: Diagnosis present

## 2021-02-06 LAB — GLUCOSE, CAPILLARY: Glucose-Capillary: 105 mg/dL — ABNORMAL HIGH (ref 70–99)

## 2021-02-06 MED ORDER — FLUDEOXYGLUCOSE F - 18 (FDG) INJECTION
14.3000 | Freq: Once | INTRAVENOUS | Status: AC | PRN
  Administered 2021-02-06: 14.53 via INTRAVENOUS

## 2021-02-09 ENCOUNTER — Telehealth: Payer: Self-pay | Admitting: Acute Care

## 2021-02-09 NOTE — Telephone Encounter (Signed)
Left detailed message for pt to call to schedule nodule consult with Dr Valeta Harms on 02/22/21.

## 2021-02-09 NOTE — Telephone Encounter (Signed)
I have called the patient with the results of his PET scan. I explained that his PET scan showed abnormal adenopathy in the neck. Chest and upper abdomen. Dr. Valeta Harms has reviewed the scan with Dr. Kipp Brood. Pt needs to be scheduled with Dr. Valeta Harms in a nodule slot on 5/11. Langley Gauss, can you please call the patient and schedule him. I have already told him the date, but if you can you get him scheduled for a specific time?  Thanks so much Also, leave on tickle list. If this is not cancer will need to follow up in 1 year with a scan. Thanks

## 2021-02-14 ENCOUNTER — Ambulatory Visit: Payer: 59 | Admitting: Pulmonary Disease

## 2021-02-17 NOTE — Progress Notes (Signed)
Pt. Is scheduled to see Dr. Valeta Harms 5/11 for follow up.  Please keep on tickle list in the event this is not cancer so he can get screened next year

## 2021-02-22 ENCOUNTER — Other Ambulatory Visit: Payer: Self-pay

## 2021-02-22 ENCOUNTER — Ambulatory Visit (INDEPENDENT_AMBULATORY_CARE_PROVIDER_SITE_OTHER): Payer: 59 | Admitting: Pulmonary Disease

## 2021-02-22 ENCOUNTER — Encounter: Payer: Self-pay | Admitting: Pulmonary Disease

## 2021-02-22 VITALS — BP 136/82 | HR 96 | Ht 74.0 in | Wt 273.0 lb

## 2021-02-22 DIAGNOSIS — G4733 Obstructive sleep apnea (adult) (pediatric): Secondary | ICD-10-CM | POA: Diagnosis not present

## 2021-02-22 DIAGNOSIS — Z6835 Body mass index (BMI) 35.0-35.9, adult: Secondary | ICD-10-CM | POA: Diagnosis not present

## 2021-02-22 DIAGNOSIS — Z87891 Personal history of nicotine dependence: Secondary | ICD-10-CM | POA: Diagnosis not present

## 2021-02-22 DIAGNOSIS — J9859 Other diseases of mediastinum, not elsewhere classified: Secondary | ICD-10-CM | POA: Diagnosis not present

## 2021-02-22 NOTE — Progress Notes (Signed)
Synopsis: Referred in May 2022 for mediastinal mass by Janith Lima, MD  Subjective:   PATIENT ID: James Parrish GENDER: male DOB: 04-26-1966, MRN: [id number]  Chief Complaint  Patient presents with  . Consult    Referred by lung cancer screening clinic for lung nodule.     This is a 55 year old gentleman that had his first lung cancer screening CT which revealed a mediastinal mass.  Patient has past medical history of hypertension, hyperlipidemia, COVID-19 also has sleep apnea on CPAP.  Patient's initial lung cancer screening CT was completed on 01/04/2021.  This revealed a 5.1 x 3.6 x 8.5 cm left paratracheal mediastinal mass.  And has tracheal deviation due to its large size concern for potential underlying lymphoproliferative disorder.  Patient had a nuclear medicine pet image completed on 02/06/2021.  This revealed a dominant left-sided left paratracheal mass with low-level activity.  They felt like based on the prior CT that there was a potential small fat plane separating this lesion from the thyroid gland and that this is a separate lesion and not related to the thyroid.   Past Medical History:  Diagnosis Date  . Complication of anesthesia    hard to wake up   . Diabetes mellitus without complication ([place])   . ED (erectile dysfunction)   . History of COVID-19 04/2019  . Hyperlipidemia   . Hypertension   . Sleep apnea    uses CPAP nightly     Family History  Problem Relation Age of Onset  . Cancer Mother        breast cancer   . Hypertension Mother   . Diabetes Mother   . Venous thrombosis Son   . Colon cancer Neg Hx   . Esophageal cancer Neg Hx   . Stomach cancer Neg Hx   . Rectal cancer Neg Hx      Past Surgical History:  Procedure Laterality Date  . COLONOSCOPY    . finger surgery Right    Index  . KNEE SURGERY Left   . KNEE SURGERY Right   . ORIF PATELLA Right   . REVERSE SHOULDER ARTHROPLASTY Right 08/31/2020   Procedure: REVERSE SHOULDER  ARTHROPLASTY;  Surgeon: Hiram Gash, MD;  Location: [facility];  Service: Orthopedics;  Laterality: Right;  . SHOULDER ARTHROSCOPY WITH ROTATOR CUFF REPAIR Left 02/04/2020   Procedure: SHOULDER ARTHROSCOPY WITH ROTATOR CUFF REPAIR WITH BICEP TENODESIS;  Surgeon: Hiram Gash, MD;  Location: [facility];  Service: Orthopedics;  Laterality: Left;    Social History   Socioeconomic History  . Marital status: Married    Spouse name: Not on file  . Number of children: Not on file  . Years of education: Not on file  . Highest education level: Not on file  Occupational History  . Not on file  Tobacco Use  . Smoking status: Former Smoker    Packs/day: 1.50    Years: 25.00    Pack years: 37.50    Types: Cigarettes    Quit date: 04/2020    Years since quitting: 0.8  . Smokeless tobacco: Never Used  Vaping Use  . Vaping Use: Never used  Substance and Sexual Activity  . Alcohol use: Yes    Comment: occasionally  . Drug use: Never  . Sexual activity: Yes  Other Topics Concern  . Not on file  Social History Narrative  . Not on file   Social Determinants of Health   Financial Resource Strain:  Not on file  Food Insecurity: Not on file  Transportation Needs: Not on file  Physical Activity: Not on file  Stress: Not on file  Social Connections: Not on file  Intimate Partner Violence: Not on file     No Known Allergies   Outpatient Medications Prior to Visit  Medication Sig Dispense Refill  . amLODipine (NORVASC) 10 MG tablet Take 1 tablet (10 mg total) by mouth at bedtime. 90 tablet 1  . atorvastatin (LIPITOR) 10 MG tablet Take 10 mg by mouth daily.     [name] Kitchen CINNAMON PO Take 1,000 mg by mouth daily.    . empagliflozin (JARDIANCE) 25 MG TABS tablet Take 1 tablet (25 mg total) by mouth daily before breakfast. 90 tablet 3  . etodolac (LODINE XL) 400 MG 24 hr tablet Take 1 tablet (400 mg total) by mouth daily. 30 tablet 0  . glucose blood test strip Use as  instructed to test blood sugar 2 times daily E11.65    Onetouch strips 100 each 12  . indapamide (LOZOL) 1.25 MG tablet Take 1 tablet (1.25 mg total) by mouth daily. 90 tablet 0  . insulin degludec (TRESIBA FLEXTOUCH) 100 UNIT/ML FlexTouch Pen Inject 35 Units into the skin daily. 30 mL 3  . losartan (COZAAR) 100 MG tablet Take 1 tablet (100 mg total) by mouth daily. 90 tablet 1  . metFORMIN (GLUCOPHAGE) 1000 MG tablet Take 1 tablet (1,000 mg total) by mouth in the morning and at bedtime. 180 tablet 3  . Multiple Vitamin (MULTIVITAMIN WITH MINERALS) TABS tablet Take 1 tablet by mouth daily.    . Nebivolol HCl 20 MG TABS Take 1 tablet (20 mg total) by mouth daily. 90 tablet 0  . Semaglutide, 1 MG/DOSE, (OZEMPIC, 1 MG/DOSE,) 4 MG/3ML SOPN Inject 1 mg into the skin once a week. 9 mL 3  . spironolactone (ALDACTONE) 50 MG tablet Take 1 tablet (50 mg total) by mouth daily. 90 tablet 1  . tadalafil (CIALIS) 20 MG tablet Take 20 mg by mouth daily as needed (ED).     . Testosterone 20.25 MG/ACT (1.62%) GEL Place 2 Act onto the skin daily. 225 g 1   No facility-administered medications prior to visit.    Review of Systems  Constitutional: Negative for chills, fever, malaise/fatigue and weight loss.  HENT: Negative for hearing loss, sore throat and tinnitus.   Eyes: Negative for blurred vision and double vision.  Respiratory: Negative for cough, hemoptysis, sputum production, shortness of breath, wheezing and stridor.   Cardiovascular: Negative for chest pain, palpitations, orthopnea, leg swelling and PND.  Gastrointestinal: Negative for abdominal pain, constipation, diarrhea, heartburn, nausea and vomiting.  Genitourinary: Negative for dysuria, hematuria and urgency.  Musculoskeletal: Negative for joint pain and myalgias.  Skin: Negative for itching and rash.  Neurological: Negative for dizziness, tingling, weakness and headaches.  Endo/Heme/Allergies: Negative for environmental allergies. Does not  bruise/bleed easily.  Psychiatric/Behavioral: Negative for depression. The patient is not nervous/anxious and does not have insomnia.   All other systems reviewed and are negative.    Objective:  Physical Exam Vitals reviewed.  Constitutional:      General: He is not in acute distress.    Appearance: He is well-developed. He is obese.  HENT:     Head: Normocephalic and atraumatic.  Eyes:     General: No scleral icterus.    Conjunctiva/sclera: Conjunctivae normal.     Pupils: Pupils are equal, round, and reactive to light.  Neck:  Vascular: No JVD.     Trachea: No tracheal deviation.  Cardiovascular:     Rate and Rhythm: Normal rate and regular rhythm.     Heart sounds: Normal heart sounds. No murmur heard.   Pulmonary:     Effort: Pulmonary effort is normal. No tachypnea, accessory muscle usage or respiratory distress.     Breath sounds: No stridor. No wheezing, rhonchi or rales.  Abdominal:     General: There is no distension.  Musculoskeletal:        General: No tenderness.     Cervical back: Neck supple.  Lymphadenopathy:     Cervical: No cervical adenopathy.  Skin:    General: Skin is warm and dry.     Capillary Refill: Capillary refill takes less than 2 seconds.     Findings: No rash.  Neurological:     Mental Status: He is alert and oriented to person, place, and time.  Psychiatric:        Behavior: Behavior normal.      Vitals:   02/22/21 1544  BP: 136/82  Pulse: 96  SpO2: 98%  Weight: 273 lb (123.8 kg)  Height: 6\' 2"  (1.88 m)   98% on RA BMI Readings from Last 3 Encounters:  02/22/21 35.05 kg/m  01/31/21 35.44 kg/m  01/04/21 34.72 kg/m   Wt Readings from Last 3 Encounters:  02/22/21 273 lb (123.8 kg)  01/31/21 276 lb (125.2 kg)  01/04/21 270 lb 6.4 oz (122.7 kg)     CBC    Component Value Date/Time   WBC 9.6 01/25/2020 1147   RBC 6.26 (H) 01/25/2020 1147   HGB 15.6 01/25/2020 1147   HCT 49.5 01/25/2020 1147   PLT 241 01/25/2020  1147   MCV 79.1 (L) 01/25/2020 1147   MCH 24.9 (L) 01/25/2020 1147   MCHC 31.5 01/25/2020 1147   RDW 16.0 (H) 01/25/2020 1147    Chest Imaging: 02/06/2021 nuclear medicine pet image: Left paratracheal mass, low-level PET uptake. Unclear if this lesion is related to thyroid gland or a separate mediastinal lesion. The patient's images have been independently reviewed by me.    Pulmonary Functions Testing Results: No flowsheet data found.  FeNO:   Pathology:   Echocardiogram:   Heart Catheterization:     Assessment & Plan:     ICD-10-CM   1. Mediastinal mass  J98.59 TSH    T3    T3, free    T3 Uptake    T4    T4, free    T4, free    T4    T3 Uptake    T3, free    T3    TSH  2. Personal history of tobacco use, presenting hazards to health  Z87.891   3. Former smoker  Z87.891   4. OSA (obstructive sleep apnea)  G47.33   5. BMI 35.0-35.9,adult  Z68.35     Discussion:  This is a 54 year old gentleman with a newly found mediastinal mass.  It appears per imaging that were dealing with 2 separate processes and is not contiguous with the left thyroid gland based on CT imaging.  Does appear there is a small fat plane in between the mediastinal mass on the thyroid gland.  However I do think we need to ensure that this is the case.  I looked at the images myself.  I reviewed the images today in the office with the patient and his wife.  I will also review this case in medical thoracic oncology conference.  I think we need to decide whether or not were going to need a thoracic MRI versus a nuclear medicine thyroid study.  Plan: I will discuss case with medical thoracic oncology group. Will need to review images and decide whether or not patient needs a MRI of the chest versus a nuclear thyroid study to help separate whether or not the mediastinal lesion is contiguous with the left thyroid gland. Especially before we made any decisions to consider endobronchial ultrasound with  needle sampling of the mediastinal mass.  That may be for steps on helping to determine what were dealing with if inside the mediastinum. I have reviewed case with thoracic surgery via phone as well. I have ordered labs for evaluation of the thyroid to include TSH, T3 and T4.    Current Outpatient Medications:  .  amLODipine (NORVASC) 10 MG tablet, Take 1 tablet (10 mg total) by mouth at bedtime., Disp: 90 tablet, Rfl: 1 .  atorvastatin (LIPITOR) 10 MG tablet, Take 10 mg by mouth daily. , Disp: , Rfl:  .  CINNAMON PO, Take 1,000 mg by mouth daily., Disp: , Rfl:  .  empagliflozin (JARDIANCE) 25 MG TABS tablet, Take 1 tablet (25 mg total) by mouth daily before breakfast., Disp: 90 tablet, Rfl: 3 .  etodolac (LODINE XL) 400 MG 24 hr tablet, Take 1 tablet (400 mg total) by mouth daily., Disp: 30 tablet, Rfl: 0 .  glucose blood test strip, Use as instructed to test blood sugar 2 times daily E11.65    Onetouch strips, Disp: 100 each, Rfl: 12 .  indapamide (LOZOL) 1.25 MG tablet, Take 1 tablet (1.25 mg total) by mouth daily., Disp: 90 tablet, Rfl: 0 .  insulin degludec (TRESIBA FLEXTOUCH) 100 UNIT/ML FlexTouch Pen, Inject 35 Units into the skin daily., Disp: 30 mL, Rfl: 3 .  losartan (COZAAR) 100 MG tablet, Take 1 tablet (100 mg total) by mouth daily., Disp: 90 tablet, Rfl: 1 .  metFORMIN (GLUCOPHAGE) 1000 MG tablet, Take 1 tablet (1,000 mg total) by mouth in the morning and at bedtime., Disp: 180 tablet, Rfl: 3 .  Multiple Vitamin (MULTIVITAMIN WITH MINERALS) TABS tablet, Take 1 tablet by mouth daily., Disp: , Rfl:  .  Nebivolol HCl 20 MG TABS, Take 1 tablet (20 mg total) by mouth daily., Disp: 90 tablet, Rfl: 0 .  Semaglutide, 1 MG/DOSE, (OZEMPIC, 1 MG/DOSE,) 4 MG/3ML SOPN, Inject 1 mg into the skin once a week., Disp: 9 mL, Rfl: 3 .  spironolactone (ALDACTONE) 50 MG tablet, Take 1 tablet (50 mg total) by mouth daily., Disp: 90 tablet, Rfl: 1 .  tadalafil (CIALIS) 20 MG tablet, Take 20 mg by mouth  daily as needed (ED). , Disp: , Rfl:  .  Testosterone 20.25 MG/ACT (1.62%) GEL, Place 2 Act onto the skin daily., Disp: 225 g, Rfl: 1  I spent 61 minutes dedicated to the care of this patient on the date of this encounter to include pre-visit review of records, face-to-face time with the patient discussing conditions above, post visit ordering of testing, clinical documentation with the electronic health record, making appropriate referrals as documented, and communicating necessary findings to members of the patients care team.   Garner Nash, [facility] 02/22/2021 4:12 PM

## 2021-02-22 NOTE — Patient Instructions (Addendum)
Thank you for visiting Dr. Valeta Harms at [facility]. Today we recommend the following:  Orders Placed This Encounter  Procedures  . TSH  . T3  . T3, free  . T3 Uptake  . T4  . T4, free   Return in about 3 months (around 05/25/2021).    Please do your part to reduce the spread of COVID-19.

## 2021-02-23 ENCOUNTER — Telehealth: Payer: Self-pay

## 2021-02-23 LAB — T3 UPTAKE: T3 Uptake: 32 % (ref 22–35)

## 2021-02-23 LAB — T4
Free Thyroxine Index: 2.8 (ref 1.4–3.8)
T4, Total: 8.6 ug/dL (ref 4.9–10.5)

## 2021-02-23 LAB — T3, FREE: T3, Free: 3.5 pg/mL (ref 2.3–4.2)

## 2021-02-23 LAB — TSH: TSH: 1.25 u[IU]/mL (ref 0.35–4.50)

## 2021-02-23 LAB — T4, FREE: Free T4: 0.98 ng/dL (ref 0.60–1.60)

## 2021-02-23 LAB — T3: T3, Total: 125 ng/dL (ref 76–181)

## 2021-02-23 NOTE — Addendum Note (Signed)
Addended by: Garner Nash on: 02/23/2021 08:08 AM   Modules accepted: Orders

## 2021-02-23 NOTE — Progress Notes (Signed)
ATC patient per DPR left detailed message and advised him to call with any questions or concerns

## 2021-02-23 NOTE — Progress Notes (Signed)
PCCM:  Case reviewed this at [facility] this AM. We will order a NM Thyroid Scan.   Garner Nash, DO [facility] 02/23/2021 8:07 AM

## 2021-02-23 NOTE — Telephone Encounter (Signed)
-----   Message from Garner Nash, DO sent at 02/23/2021  8:07 AM EDT ----- Please let patient know that his case was reviewed this morning in conference and I have ordered a nuclear medicine thyroid scan. PCCs, can we get this scheduled?  Thanks Lance Muss, DO [facility] 02/23/2021 8:07 AM

## 2021-02-23 NOTE — Telephone Encounter (Signed)
ATC patient per DPR left detailed message with information from Dr. Valeta Harms. Advised him to call with any questions or concerns. Advised him that he should hear from the [facility] to get him scheduled. Nothing further needed at this time.

## 2021-02-24 ENCOUNTER — Other Ambulatory Visit (HOSPITAL_COMMUNITY): Payer: Self-pay | Admitting: Pulmonary Disease

## 2021-02-24 DIAGNOSIS — J9859 Other diseases of mediastinum, not elsewhere classified: Secondary | ICD-10-CM

## 2021-03-03 ENCOUNTER — Other Ambulatory Visit: Payer: Self-pay | Admitting: Internal Medicine

## 2021-03-03 DIAGNOSIS — M545 Low back pain, unspecified: Secondary | ICD-10-CM

## 2021-03-09 ENCOUNTER — Telehealth: Payer: Self-pay | Admitting: Pulmonary Disease

## 2021-03-09 NOTE — Telephone Encounter (Signed)
Will route this to Dr. Valeta Harms as he is the one that ordered the test.  We received a denial on this test that was ordered and looking at the paper work it states they need a definitive dx with a adequate tissue sample I have the paper work from insurance in my office    By Harland German     Dr. Valeta Harms: please advise on order for NM SCAN TUMOR LOCALIZE WITH SPECT. Thanks!

## 2021-03-09 NOTE — Telephone Encounter (Signed)
Please contact radiology. This specific image was requested by the radiologist to help determine the question we are trying to answer.   Thanks  Garner Nash, DO [facility] 03/09/2021 5:50 PM

## 2021-03-10 ENCOUNTER — Ambulatory Visit (HOSPITAL_COMMUNITY): Payer: 59

## 2021-03-10 ENCOUNTER — Other Ambulatory Visit (HOSPITAL_COMMUNITY): Payer: 59

## 2021-03-10 NOTE — Telephone Encounter (Signed)
Left message for the patient that we are still working to get the approval and should be getting back to him soon and for him to call me if he has any questions

## 2021-03-10 NOTE — Telephone Encounter (Signed)
Hey Museum/gallery conservator Dr Valeta Harms wanted me to contact the radiologist

## 2021-03-14 NOTE — Telephone Encounter (Signed)
James Parrish,  could you call me at [phone]?  Thank you.

## 2021-03-15 NOTE — Telephone Encounter (Signed)
I attempted to call Amber but she is not in the office they asked me to email her so I did

## 2021-03-15 NOTE — Telephone Encounter (Signed)
Diagnosis: Thyroid Goiter  Garner Nash, DO [facility] 03/15/2021 4:23 PM

## 2021-03-15 NOTE — Telephone Encounter (Signed)
Dr Valeta Harms I spoke to [employer] she stated that we can try and just get the thyroid scan authorized but may need to work on the diagnosis said that might be why they are not covering it might need something saying about the thyroid

## 2021-03-16 NOTE — Telephone Encounter (Signed)
I have put the codes through the [employer] website waiting on there response

## 2021-03-21 ENCOUNTER — Other Ambulatory Visit: Payer: Self-pay | Admitting: Internal Medicine

## 2021-03-21 DIAGNOSIS — I10 Essential (primary) hypertension: Secondary | ICD-10-CM

## 2021-03-21 NOTE — Telephone Encounter (Signed)
I got this approved I have Insurance underwriter she is going to contact the patient and get him scheduled

## 2021-04-02 ENCOUNTER — Other Ambulatory Visit: Payer: Self-pay | Admitting: Internal Medicine

## 2021-04-02 DIAGNOSIS — I10 Essential (primary) hypertension: Secondary | ICD-10-CM

## 2021-04-06 ENCOUNTER — Ambulatory Visit: Payer: 59 | Admitting: Internal Medicine

## 2021-04-11 ENCOUNTER — Ambulatory Visit (INDEPENDENT_AMBULATORY_CARE_PROVIDER_SITE_OTHER): Payer: 59 | Admitting: Internal Medicine

## 2021-04-11 ENCOUNTER — Other Ambulatory Visit: Payer: Self-pay

## 2021-04-11 ENCOUNTER — Encounter: Payer: Self-pay | Admitting: Internal Medicine

## 2021-04-11 VITALS — BP 152/86 | HR 92 | Ht 74.0 in | Wt 270.0 lb

## 2021-04-11 DIAGNOSIS — Z794 Long term (current) use of insulin: Secondary | ICD-10-CM

## 2021-04-11 DIAGNOSIS — E119 Type 2 diabetes mellitus without complications: Secondary | ICD-10-CM

## 2021-04-11 DIAGNOSIS — J9859 Other diseases of mediastinum, not elsewhere classified: Secondary | ICD-10-CM

## 2021-04-11 LAB — POCT GLYCOSYLATED HEMOGLOBIN (HGB A1C): Hemoglobin A1C: 6.8 % — AB (ref 4.0–5.6)

## 2021-04-11 LAB — POCT GLUCOSE (DEVICE FOR HOME USE): POC Glucose: 135 mg/dl — AB (ref 70–99)

## 2021-04-11 NOTE — Progress Notes (Signed)
Name: James Parrish  Age/ Sex: 55 y.o., male   MRN/ DOB: [id number], [birth year]     PCP: Janith Lima, MD   Reason for Endocrinology Evaluation: Type 2 Diabetes Mellitus  Initial Endocrine Consultative Visit: 07/13/2019    PATIENT IDENTIFIER: James Parrish is a 55 y.o. male with a past medical history of T2Dm, HTN and Dyslipidemia. The patient has followed with Endocrinology clinic since 07/13/2019 for consultative assistance with management of his diabetes.  DIABETIC HISTORY:  James Parrish was diagnosed with DM prior to 2010, was initially on oral glycemic agents, insulin started in 2013. His hemoglobin A1c has ranged from 7.2% in 2018, peaking at 8.8% in 2019.  On his initial visit to our clinic, his A1c 7.7% . He was on lantus, victoza, metformin and Jardiance. We switched Victoza to Ozempic.    He is a truck driver SUBJECTIVE:   During the last visit (05/11/2020): A1c 6.9% . We continued Ozempic, Lantus, Jardiance,and  Metformin  Today (04/11/2021): James Parrish is here for a 4 month follow up on diabetes. He is accompanied by his wife James Parrish.  He checks his blood sugars 1 times . The patient has not had hypoglycemic episodes since the last clinic visit.   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  He is currently being followed by pulmonology, they are evaluating mediastinal mass, PET imaging has been done April/2022, currently pending thyroid uptake and scan.  Patient has no local neck symptoms He is clinically euthyroid  HOME DIABETES REGIMEN:  Ozempic 1.0 mg weekly ( Sundays  )  Jardiance 25 mg daily  Metformin 1000 mg Twice daily  Tresiba 34 units daily      DIABETIC COMPLICATIONS: Microvascular complications:    Denies: CKD, retinopathy, neuropathy  Last eye exam: Completed 11/2020   Macrovascular complications:    Denies: CAD, PVD, CVA  METER  DOWNLOAD SUMMARY:Did not bring     HISTORY:  Past Medical History:  Past Medical History:  Diagnosis Date   Complication of anesthesia    hard to wake up    Diabetes mellitus without complication ([place])    ED (erectile dysfunction)    History of COVID-19 04/2019   Hyperlipidemia    Hypertension    Sleep apnea    uses CPAP nightly   Past Surgical History:  Past Surgical History:  Procedure Laterality Date   COLONOSCOPY     finger surgery Right    Index   KNEE SURGERY Left    KNEE SURGERY Right    ORIF PATELLA Right    REVERSE SHOULDER ARTHROPLASTY Right 08/31/2020   Procedure: REVERSE SHOULDER ARTHROPLASTY;  Surgeon: Hiram Gash, MD;  Location: [place];  Service: Orthopedics;  Laterality: Right;   SHOULDER ARTHROSCOPY WITH ROTATOR CUFF REPAIR Left 02/04/2020   Procedure: SHOULDER ARTHROSCOPY WITH ROTATOR CUFF REPAIR WITH BICEP TENODESIS;  Surgeon: Hiram Gash, MD;  Location: [place];  Service: Orthopedics;  Laterality: Left;   Social History:  reports that he quit smoking about a year ago. His smoking use included cigarettes. He has a 37.50 pack-year smoking history. He has never used smokeless tobacco. He reports current alcohol use. He reports that he does not use drugs. Family History:  Family History  Problem Relation Age of Onset   Cancer Mother  breast cancer    Hypertension Mother    Diabetes Mother    Venous thrombosis Son    Colon cancer Neg Hx    Esophageal cancer Neg Hx    Stomach cancer Neg Hx    Rectal cancer Neg Hx      HOME MEDICATIONS: Allergies as of 04/11/2021   No Known Allergies      Medication List        Accurate as of April 11, 2021  3:27 PM. If you have any questions, ask your nurse or doctor.          amLODipine 10 MG tablet Commonly known as: NORVASC Take 1 tablet (10 mg total) by mouth at bedtime.   atorvastatin 10 MG tablet Commonly known as: LIPITOR Take 10 mg by mouth daily.    CINNAMON PO Take 1,000 mg by mouth daily.   empagliflozin 25 MG Tabs tablet Commonly known as: Jardiance Take 1 tablet (25 mg total) by mouth daily before breakfast.   etodolac 400 MG 24 hr tablet Commonly known as: LODINE XL TAKE ONE TABLET BY MOUTH DAILY   glucose blood test strip Use as instructed to test blood sugar 2 times daily E11.65    Onetouch strips   indapamide 1.25 MG tablet Commonly known as: LOZOL TAKE 1 TABLET BY MOUTH DAILY   losartan 100 MG tablet Commonly known as: COZAAR Take 1 tablet (100 mg total) by mouth daily.   metFORMIN 1000 MG tablet Commonly known as: GLUCOPHAGE Take 1 tablet (1,000 mg total) by mouth in the morning and at bedtime.   multivitamin with minerals Tabs tablet Take 1 tablet by mouth daily.   Nebivolol HCl 20 MG Tabs TAKE 1 TABLET BY MOUTH DAILY   Ozempic (1 MG/DOSE) 4 MG/3ML Sopn Generic drug: Semaglutide (1 MG/DOSE) Inject 1 mg into the skin once a week.   spironolactone 50 MG tablet Commonly known as: ALDACTONE Take 1 tablet (50 mg total) by mouth daily.   tadalafil 20 MG tablet Commonly known as: CIALIS Take 20 mg by mouth daily as needed (ED).   Testosterone 20.25 MG/ACT (1.62%) Gel Place 2 Act onto the skin daily.   Tyler Aas FlexTouch 100 UNIT/ML FlexTouch Pen Generic drug: insulin degludec Inject 35 Units into the skin daily.         OBJECTIVE:   Vital Signs: BP (!) 152/86   Pulse 92   Ht 6\' 2"  (1.88 m)   Wt 270 lb (122.5 kg)   SpO2 95%   BMI 34.67 kg/m   Wt Readings from Last 3 Encounters:  02/22/21 273 lb (123.8 kg)  01/31/21 276 lb (125.2 kg)  01/04/21 270 lb 6.4 oz (122.7 kg)     Exam: General: Pt appears well and is in NAD  Lungs: Clear with good BS bilat with no rales, rhonchi, or wheezes  Heart: RRR with normal S1 and S2 and no gallops; no murmurs; no rub  Abdomen: Normoactive bowel sounds, soft, nontender, without masses or organomegaly palpable  Extremities: No pretibial edema.    Neuro: MS is good with appropriate affect, pt is alert and Ox3    DM foot exam: 12/01/2020 The skin of the feet is intact without sores or ulcerations. The pedal pulses are 2+ on right and 2+ on left. The sensation is intact to a screening 5.07, 10 gram monofilament bilaterally     DATA REVIEWED:  Lab Results  Component Value Date   HGBA1C 7.0 (A) 12/01/2020   HGBA1C 6.9 (A) 05/11/2020  HGBA1C 7.5 (A) 12/25/2019   Lab Results  Component Value Date   MICROALBUR 5.0 05/11/2020   LDLCALC 41 05/11/2020   CREATININE 0.89 08/29/2020    05/11/2020  Microalb/cr ratio 8.9  LDL 41mg /dL  BUN/Cr 12/0.890  Results for KORAY, SOTER (MRN [id number]) as of 04/11/2021 15:38  Ref. Range 02/22/2021 16:19  TSH Latest Ref Range: 0.35 - 4.50 uIU/mL 1.25  Triiodothyronine,Free,Serum Latest Ref Range: 2.3 - 4.2 pg/mL 3.5  Triiodothyronine (T3) Latest Ref Range: 76 - 181 ng/dL 125  T4,Free(Direct) Latest Ref Range: 0.60 - 1.60 ng/dL 0.98  Thyroxine (T4) Latest Ref Range: 4.9 - 10.5 mcg/dL 8.6  Free Thyroxine Index Latest Ref Range: 1.4 - 3.8  2.8  T3 Uptake Latest Ref Range: 22 - 35 % 32       CT Scan Chest 01/06/2021   Cardiovascular: Heart size is normal. There is no significant pericardial fluid, thickening or pericardial calcification. No atherosclerotic calcifications in the thoracic aorta or the coronary arteries.   Mediastinum/Nodes: Large soft tissue mass extending into the middle mediastinum along the left paratracheal region measuring 5.1 x 3.6 x 8.5 cm. This is intimately associated with the left lobe of the thyroid gland, however, on the coronal reconstructions, there does appear to be a potential intervening fat plane. Several other prominent but nonenlarged mediastinal lymph nodes are noted, largest of which measures up to 1.2 cm in the prevascular nodal station. Esophagus is unremarkable in appearance. No axillary lymphadenopathy. Esophagus is unremarkable in  appearance. Multiple borderline enlarged bilateral axillary lymph nodes with enlarged right subpectoral lymph node measuring 2 cm (axial image 12 of series 2).   Lungs/Pleura: Tiny pulmonary nodule in the medial aspect of the right lower lobe (axial image 232 of series 3), with a volume derived mean diameter of 2.0 mm. No other larger more suspicious appearing pulmonary nodules or masses are noted. No acute consolidative airspace disease. No pleural effusions. Diffuse bronchial wall thickening with very mild centrilobular and paraseptal emphysema   Upper Abdomen: Mild diffuse low attenuation throughout the hepatic parenchyma, indicative of hepatic steatosis.   Musculoskeletal: Status post right shoulder arthroplasty. There are no aggressive appearing lytic or blastic lesions noted in the visualized portions of the skeleton.   IMPRESSION: 1. Lung-RADS 2S, benign appearance or behavior. Continue annual screening with low-dose chest CT without contrast in 12 months. 2. The "S" modifier above refers to potentially clinically significant non lung cancer related findings. Specifically, lymphadenopathy noted in the mediastinal, axillary and subpectoral nodal stations, as above. Most significantly, there is what appears to be a large middle mediastinal mass measuring 5.1 x 3.6 x 8.5 cm in the left paratracheal nodal station. Underlying lymphoproliferative disease is suspected. Further evaluation with PET-CT should be considered. 3. Mild diffuse bronchial wall thickening with very mild centrilobular and paraseptal emphysema; imaging findings suggestive of underlying COPD. 4. Hepatic steatosis.   Emphysema (ICD10-J43.9).   PET scan 02/06/2021 Mild to moderate degree of abnormal adenopathy in the neck, chest, and upper abdomen, Deauville 2. 2. The dominant left paratracheal mass also has Deauville 2 activity. Although difficult to separate from the left thyroid lobe I concur that on  prior chest CT there is potentially a small fat plane separating this lesion from the thyroid, and given the presence of the other Deauville 2 adenopathy, this lesion seems more likely to be due to lymphoproliferative process rather than a thyroid lesion. Tissue sampling would be further definitive. 3. Other imaging findings of potential clinical significance: Mild  cardiomegaly. Aortic Atherosclerosis (ICD10-I70.0). Suspected left subcoracoid bursitis. Diffuse hepatic steatosis.    ASSESSMENT / PLAN / RECOMMENDATIONS:   1) Type 2 Diabetes Mellitus, Optimally controlled, Without  complications - Most recent A1c of 6.8 %. Goal A1c < 7.0 %.    - A1c continues to be optimal -I have praised the patient on glycemic control, no changes today  MEDICATIONS: -Continue Ozempic 1.0 mg weekly  -Continue Jardiance 25 mg daily  -Continue Metformin 1000 mg Twice daily  -Continue Basaglar 34 units daily    EDUCATION / INSTRUCTIONS: BG monitoring instructions: Patient is instructed to check his blood sugars 1 time a day. Call [facility] if: BG persistently < 70  I reviewed the Rule of 15 for the treatment of hypoglycemia in detail with the patient. Literature supplied.    2) Left mediastinal mass  -This was an incidental finding on lung cancer screen in 12/2020, there is a question that this is an extension from the left thyroid lobe ,PET CT has been done which was unrevealing, per review of the chart there are planning on scheduling thyroid uptake and scan.  -I am going to proceed with thyroid ultrasound -He is clinically and biochemically euthyroid      F/U in 6 months    Signed electronically by: Mack Guise, MD  [facility]  [facility] [address] [place], [place] [address] Phone: [phone] FAX: [phone]   CC: Janith Lima, MD [place] [address] Phone: [phone]  Fax:  [phone]  Return to Endocrinology clinic as below: Future Appointments  Date Time Provider [place]  04/11/2021  3:40 PM Grisela Mesch, Melanie Crazier, MD [facility]  05/18/2021  2:30 PM Parrett, Fonnie Mu, NP [facility] None

## 2021-04-11 NOTE — Patient Instructions (Signed)
-   Keep Up The Good Work ! - Continue Ozempic 1 mg weekly  - Continue 34 units daily  - Continue Metformin 1000 mg, 1 tablet twice daily  - Continue Jardiance 25 mg, 1 tablet with Breakfast     HOW TO TREAT LOW BLOOD SUGARS (Blood sugar LESS THAN 70 MG/DL) Please follow the RULE OF 15 for the treatment of hypoglycemia treatment (when your (blood sugars are less than 70 mg/dL)   STEP 1: Take 15 grams of carbohydrates when your blood sugar is low, which includes:  3-4 GLUCOSE TABS  OR 3-4 OZ OF JUICE OR REGULAR SODA OR ONE TUBE OF GLUCOSE GEL    STEP 2: RECHECK blood sugar in 15 MINUTES STEP 3: If your blood sugar is still low at the 15 minute recheck --> then, go back to STEP 1 and treat AGAIN with another 15 grams of carbohydrates.

## 2021-04-12 ENCOUNTER — Telehealth: Payer: Self-pay | Admitting: Pulmonary Disease

## 2021-04-12 NOTE — Telephone Encounter (Signed)
I spoke with James Parrish and she stated that they have been trying to contact the pt to let him know who he needs to contact to get the MRI set up.   I have called the pt and he is aware that he will need to call James Parrish at [phone] to get it scheduled.  Pt voiced his understanding and nothing further is needed.

## 2021-04-12 NOTE — Telephone Encounter (Signed)
Called and spoke with patient who states that when he called the number this morning after talking to Marliss Czar it gave him a message that the [employer] number could not be completed at this time. I advised him that I double checked and that is the correct number he needs to call to talk to [employer] and for him to try it again tomorrow and possibly from a different phone cause since it was saying something about [employer] I wasn't sure if it was a cell phone error or range error. Patient expressed understanding. Nothing further needed at this time.

## 2021-04-12 NOTE — Telephone Encounter (Signed)
Called and spoke with pt and he stated that someone had reached out to him to get the MRI scheduled for him.  He stated that when they called him he could not talk and that person never called him back.  He wanted to make sure that this still needed to be done.  BI the only thing that I see ordered is the NM thyroid imaging and the NM scan tumor localize with spect.  Pt thought that it was for an MRI but I do not see that in the chart.  Please help.  Thanks  Plan from [place] visit on 5/11 with BI  Plan: I will discuss case with medical thoracic oncology group. Will need to review images and decide whether or not patient needs a MRI of the chest versus a nuclear thyroid study to help separate whether or not the mediastinal lesion is contiguous with the left thyroid gland. Especially before we made any decisions to consider endobronchial ultrasound with needle sampling of the mediastinal mass.  That may be for steps on helping to determine what were dealing with if inside the mediastinum. I have reviewed case with thoracic surgery via phone as well. I have ordered labs for evaluation of the thyroid to include TSH, T3 and T4.

## 2021-04-14 ENCOUNTER — Telehealth: Payer: Self-pay | Admitting: Pulmonary Disease

## 2021-04-14 ENCOUNTER — Telehealth: Payer: Self-pay

## 2021-04-14 NOTE — Telephone Encounter (Signed)
Called and spoke with patient and gave him the information I received from [employer] which I will post below. He expressed understanding. A mychart message has sent to patient as well with all of the information per his request because he was driving. Nothing further needed at this time.   I have him scheduled to arrive at 8:30 am on Friday July 15th for a 9 am appt @ [facility].  He will swallow a pill (no side effects) and return 4 hours later for imaging.  He should not eat or drink anything other than water the morning of the exam.  This is a specialty exam.  It doesn't even really exist.  What he needed, his insurance will not cover.  We are doing a Thyroid Scan and then adding a couple of additional images (at no charge to him) to get the info that Dr. Valeta Harms is needing.  If he can not make this appt at this date and time, he MUST speak with me or Theodosia Quay to cancel / reschedule.  If anyone else answers or if they try to send him to anyone else tell him to tell them that [employer] said he can only speak to [employer] or [employer].  I would encourage him to make EVERY effort possible to make this appt.  We have been attempting for at least 6 weeks to get in touch with him to get this scheduled.  As mentioned earlier, I will be out of town until July 12th and if he needs anything his contact person is Lovena Le at [phone].  Thanks.

## 2021-04-14 NOTE — Telephone Encounter (Signed)
Spoke with [employer] this morning over Epic chat about getting this patient scheduled and she gave me more information and a different number to give the patient to call to get him scheduled. Called and spoke with patient to give him updated information and the new phone number and asked him to please call before 1:30 so Amber could help get him scheduled. Patient expressed understanding and stated that he was going to call right now. Below is the message I received from [employer]:  Yes.  He should call [phone] to schedule.  That is Lovena Le our Government social research officer.  He will require specialty imaging and will need to be scheduled very specifically for the type of images that the Radiologist want.  If he calls today before 1:30p, I can assist the specialty scheduler with finding a spot to put him in.  The images that he needs, we dont ususally take with this exam so she will need my assistance to add him to the schedule.  I am leaving at 1:30p today and will return July 12th.  Please encourage him to call asap.  Thanks.  Nothing further needed at this time.

## 2021-04-14 NOTE — Telephone Encounter (Signed)
Called pt and no answer- LMTCB

## 2021-04-14 NOTE — Telephone Encounter (Signed)
Per Berna Spare:  So, [name] called and I was in the middle of a therapy.  My coworker sent him to centralized scheduling....  I tried to call him back and it went straight to voicemail.  I do not see that he got his exam scheduled.  I am thinking that I am going to just schedule his exam and let you know the date and time and he should try his best to make it work.     I called and spoke with patient and gave him the information I received from [employer] which I will post below. He expressed understanding. A mychart message has sent to patient as well with all of the information per his request because he was driving. Nothing further needed at this time.   I have him scheduled to arrive at 8:30 am on Friday July 15th for a 9 am appt @ [facility].  He will swallow a pill (no side effects) and return 4 hours later for imaging.  He should not eat or drink anything other than water the morning of the exam.  This is a specialty exam.  It doesn't even really exist.  What he needed, his insurance will not cover.  We are doing a Thyroid Scan and then adding a couple of additional images (at no charge to him) to get the info that Dr. Valeta Harms is needing.  If he can not make this appt at this date and time, he MUST speak with me or Theodosia Quay to cancel / reschedule.  If anyone else answers or if they try to send him to anyone else tell him to tell them that [employer] said he can only speak to [employer] or [employer].  I would encourage him to make EVERY effort possible to make this appt.  We have been attempting for at least 6 weeks to get in touch with him to get this scheduled.  As mentioned earlier, I will be out of town until July 12th and if he needs anything his contact person is Lovena Le at [phone].  Thanks.

## 2021-04-21 ENCOUNTER — Ambulatory Visit (HOSPITAL_COMMUNITY)
Admission: RE | Admit: 2021-04-21 | Discharge: 2021-04-21 | Disposition: A | Discharge status: Home / Self Care | Payer: 59 | Source: Ambulatory Visit | Attending: Internal Medicine | Admitting: Internal Medicine

## 2021-04-21 ENCOUNTER — Other Ambulatory Visit: Payer: Self-pay

## 2021-04-21 DIAGNOSIS — J9859 Other diseases of mediastinum, not elsewhere classified: Secondary | ICD-10-CM | POA: Diagnosis not present

## 2021-04-24 ENCOUNTER — Telehealth: Payer: Self-pay

## 2021-04-24 NOTE — Telephone Encounter (Signed)
Received message from [employer] this morning in regards to Mr. Daws upcoming scans. Will post her messages below:  I was just informed that our radiopharmacy is unable to get the dose needed for Mr. Renovato scan on Friday.  I have exhausted every resource that I am aware of to try to make this happen on Friday.  I am sorry to say that it looks like we will have to reschedule him to a different day.  I am SO sorry.  I will get a new appt scheduled and notify you of the date and time.  He seems to respond to you all when you call much better than [place].  Thank you. I have rescheduled his appt for Monday, July 18th.  8:30 arrival time for 9am appt with return at 1p for imaging.  I called him to personally apologize for the run around and make him aware of the appt change and the reasoning.  It went straight to voicemail so I left a voicemail. Also called his wife and left her a voicemail. Thanks for your help and sorry for this being an ordeal.  We are ready to get this taken care of for Mr. Valley as I know it has been in the works for a very long time and he is in need of some answers.     Called and spoke with patient to see if he got voicemail from [employer] he stated that he has not had time to check it as he is orientation. Advised him of the messaged from her and the new information. Patient asked for all information be sent to him in a mychart message. Message has been sent with messages from [employer]. Patient expressed understanding. Nothing further needed at this time.

## 2021-04-26 ENCOUNTER — Other Ambulatory Visit: Payer: Self-pay | Admitting: Internal Medicine

## 2021-04-26 DIAGNOSIS — E041 Nontoxic single thyroid nodule: Secondary | ICD-10-CM

## 2021-04-28 ENCOUNTER — Other Ambulatory Visit (HOSPITAL_COMMUNITY): Payer: 59

## 2021-05-01 ENCOUNTER — Other Ambulatory Visit: Payer: Self-pay

## 2021-05-01 ENCOUNTER — Ambulatory Visit (HOSPITAL_COMMUNITY)
Admission: RE | Admit: 2021-05-01 | Discharge: 2021-05-01 | Disposition: A | Discharge status: Home / Self Care | Payer: 59 | Source: Ambulatory Visit | Attending: Pulmonary Disease | Admitting: Pulmonary Disease

## 2021-05-01 DIAGNOSIS — J9859 Other diseases of mediastinum, not elsewhere classified: Secondary | ICD-10-CM | POA: Diagnosis not present

## 2021-05-01 MED ORDER — SODIUM IODIDE I-123 7.4 MBQ CAPS
450.0000 | ORAL_CAPSULE | Freq: Once | ORAL | Status: AC
  Administered 2021-05-01: 450 via ORAL

## 2021-05-03 ENCOUNTER — Other Ambulatory Visit: Payer: Self-pay | Admitting: Internal Medicine

## 2021-05-03 DIAGNOSIS — I10 Essential (primary) hypertension: Secondary | ICD-10-CM

## 2021-05-04 ENCOUNTER — Telehealth: Payer: Self-pay

## 2021-05-04 NOTE — Telephone Encounter (Signed)
pt has stated he is out of spironolactone (ALDACTONE) 50 MG tablet and needs a rx refill asap. Pt states he also needs to make an apptmnt to come in before 05/31/2021 which is the next aval due to starting a new job on 05/15/21.  *Sent to MA

## 2021-05-05 ENCOUNTER — Other Ambulatory Visit: Payer: Self-pay | Admitting: Internal Medicine

## 2021-05-05 DIAGNOSIS — I10 Essential (primary) hypertension: Secondary | ICD-10-CM

## 2021-05-05 MED ORDER — SPIRONOLACTONE 50 MG PO TABS: 50.0000 mg | ORAL_TABLET | Freq: Every day | ORAL | 0 refills | Status: DC

## 2021-05-08 ENCOUNTER — Other Ambulatory Visit: Payer: Self-pay | Admitting: Internal Medicine

## 2021-05-10 ENCOUNTER — Telehealth: Payer: Self-pay | Admitting: Internal Medicine

## 2021-05-10 MED ORDER — DAPAGLIFLOZIN PROPANEDIOL 10 MG PO TABS: 10.0000 mg | ORAL_TABLET | Freq: Every day | ORAL | 6 refills | Status: DC

## 2021-05-10 NOTE — Telephone Encounter (Signed)
Called and spoke with informed patient to contact his insurance to see what could be cover for him, Pt said that he was told go to [employer] to look this information up pt was confused on the information that he need to look, I ask him to call his insurance company back and ask them to provide him with name of medication that is stable for him so we can change his medication.

## 2021-05-10 NOTE — Telephone Encounter (Signed)
Patient insurance company will not cover the Jardiance 25 MG any longer. Patient needs a replacement for [name] that is covered by insurance. Please send Rx to [facility] on [address]

## 2021-05-10 NOTE — Telephone Encounter (Signed)
Pt called back after talking with his insurance company the only other medication that  they offered is farxiga and it still $150 for 30 day supply pt stated that he wasn't able to afford this. I told pt that we could possible look into patient assistant if this medication that the provider would to change it to. Explain about pt assistant and we will need info of income to complete forms for [place].

## 2021-05-10 NOTE — Telephone Encounter (Signed)
Called and advised patient of his information, pt will stop by tomorrow after his appt downstairs to pick up co-pay, I told him that this will be left up front for him around 3pm

## 2021-05-11 ENCOUNTER — Other Ambulatory Visit (HOSPITAL_COMMUNITY)
Admission: RE | Admit: 2021-05-11 | Discharge: 2021-05-11 | Disposition: A | Discharge status: Home / Self Care | Payer: 59 | Source: Ambulatory Visit | Attending: Internal Medicine | Admitting: Internal Medicine

## 2021-05-11 ENCOUNTER — Other Ambulatory Visit: Payer: Self-pay

## 2021-05-11 ENCOUNTER — Ambulatory Visit
Admission: RE | Admit: 2021-05-11 | Discharge: 2021-05-11 | Disposition: A | Discharge status: Home / Self Care | Payer: 59 | Source: Ambulatory Visit | Attending: Internal Medicine | Admitting: Internal Medicine

## 2021-05-11 DIAGNOSIS — D440 Neoplasm of uncertain behavior of thyroid gland: Secondary | ICD-10-CM | POA: Diagnosis not present

## 2021-05-11 DIAGNOSIS — E041 Nontoxic single thyroid nodule: Secondary | ICD-10-CM

## 2021-05-16 LAB — CYTOLOGY - NON PAP

## 2021-05-17 ENCOUNTER — Telehealth: Payer: Self-pay | Admitting: Internal Medicine

## 2021-05-17 NOTE — Telephone Encounter (Signed)
I have discussed with the patient the results of the most recent FNA of the left thyroid nodule    Unfortunately the cytology report is consistent with Bethesda category I   I have recommended proceeding with diagnostic lobectomy.  I have looked at the ultrasound images, but I am unable to ascertain  a clear left thyroid nodule, after discussing the case with Dr. Valeta Harms , it appears that the description has included the mediastinal mass as well.   He will discussed the case with surgery and we will consider a mediastinal biopsy  Thyroid uptake and scan with I-123 did not show uptake of the mediastinal mass   Patient is concerned about taking more time off from work as he just started a new job.     Abby Nena Jordan, MD  [facility] Endocrin[facility]  [facility] [address] [place], [place] [address] Phone: [phone] FAX: [phone]

## 2021-05-18 ENCOUNTER — Ambulatory Visit (INDEPENDENT_AMBULATORY_CARE_PROVIDER_SITE_OTHER): Payer: 59 | Admitting: Adult Health

## 2021-05-18 ENCOUNTER — Other Ambulatory Visit: Payer: Self-pay

## 2021-05-18 ENCOUNTER — Encounter: Payer: Self-pay | Admitting: Adult Health

## 2021-05-18 VITALS — BP 128/78 | HR 97 | Temp 98.3°F | Ht 74.0 in | Wt 272.4 lb

## 2021-05-18 DIAGNOSIS — G4733 Obstructive sleep apnea (adult) (pediatric): Secondary | ICD-10-CM

## 2021-05-18 DIAGNOSIS — J9859 Other diseases of mediastinum, not elsewhere classified: Secondary | ICD-10-CM | POA: Insufficient documentation

## 2021-05-18 NOTE — Assessment & Plan Note (Signed)
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  Patient underwent a thyroid nodule biopsy.  Results were nondiagnostic. Patient will need to undergo EBUS for tissue diagnosis.  Risk and benefits reviewed with patient and wife.  Plan  Patient Instructions  Continue on CPAP At bedtime   Wear all night long  Work on healthy weight  Do not drive if sleepy  Bring SD card in for download  Order for supplies to [facility]   Will call you regarding upcoming procedure.   Follow up with Dr. Elsworth Soho in 6 months and As needed

## 2021-05-18 NOTE — H&P (View-Only) (Signed)
$'@Patient's$  ID: James Parrish, male    DOB: November 03, 1965, 55 y.o.   MRN: [id number]  Chief Complaint  Patient presents with   Follow-up    Referring provider: Sueanne Margarita, DO  HPI: 55 year old male former smoker followed for obstructive sleep apnea and mediastinal mass Medical history significant for insulin-dependent diabetes, hypertension Patient is a truck driver   TEST/EVENTS :  Lung cancer screening CT January 04, 2021 showed tiny pulmonary nodule right lower lobe, no other suspicious pulmonary nodules.  Large soft tissue mass extending in the middle of the mediastinum along the left peritracheal region measuring 5.1 x 3.6 x 8.5 cm.  Intimately associated with the left lobe of the thyroid gland, several prominent but not enlarged mediastinal lymph nodes largest measuring 1.2 cm in the prevascular nodal station, multiple borderline axillary lymph nodes with an enlarged right subpectoral lymph node measuring 2 cm, hepatic steatosis, mild emphysema  PET scan February 06, 2021 mild to moderate degree of abnormal adenopathy in the neck chest and upper abdomen, dominant left peritracheal mass, suspicious for a lymphoproliferative process rather than a thyroid lesion-SUV about 2.3.  Thyroid biopsy July 28, [id number] follicular epithelial present, nondiagnostic material.  HST 04/2020 severe OSA, AHI 79/hour, lowest desaturation 77% 06/2020 CPAP titration >> 16 cm   05/18/2021 Follow up : OSA and Mediastinal Mass  Patient presents for a follow-up visit.  Patient has severe obstructive sleep apnea.  He is on nocturnal CPAP.  Patient has recently got a new CPAP machine.  He purchased this out-of-pocket.  Patient says he is trying to wear it each night.  Does have some issues currently with his mask and supplies.  Needs an order to his homecare company for renewal of supplies.  Patient will need to bring in his CPAP SD card for download.  He does have the my air app.  This does show okay compliance on  average about 2 to 5 hours of usage.  AHI is 0.5. Patient is encouraged on compliance and usage. Patient has been out of work for the last year and a half due to medical issues.  He is getting ready to restart work.  Patient had a low-dose lung cancer screening CT in March.  This showed a large mediastinal mass measuring up to 8.5 cm.  Patient underwent a PET scan on February 06, 2021 that showed mild to moderate degree of abnormal adenopathy in the neck, chest, upper abdomen with a dominant left peritracheal mass.  Suspicious for possible lymphoproliferative process.  This is in very close proximity to thyroid.  Patient underwent a thyroid ultrasound.  Heterogenous nodule along the inferior aspect of the left thyroid nodule the echogenicity is similar to the adjacent thyroid tissue and favor a large left thyroid nodule.  Patient underwent a thyroid biopsy on May 11, 2021 that showed scant follicular epithelial present.  Nondiagnostic material. We went over his test results and discussed neck step in order to get tissue diagnosis.  Case has been discussed with Dr. Valeta Harms .  Patient will need to undergo a EBUS for tissue diagnosis  Went over risk and benefits. Patient says he feels well.  He denies any hemoptysis, unintentional weight loss cough, shortness of breath or dysphagia. Patient is not on anticoagulation.   No Known Allergies  Immunization History  Administered Date(s) Administered   Influenza, Seasonal, Injecte, Preservative Fre 08/15/2012, 08/26/2013   Influenza,inj,Quad PF,6+ Mos 08/07/2017, 08/11/2020   Influenza,inj,quad, With Preservative 09/29/2014, 08/10/2015   PFIZER(Purple Top)SARS-COV-2 Vaccination 02/13/2020,  03/07/2020, 09/16/2020   Tdap 10/16/2007, 11/24/2015    Past Medical History:  Diagnosis Date   Complication of anesthesia    hard to wake up    Diabetes mellitus without complication [facility])    ED (erectile dysfunction)    History of COVID-19 04/2019   Hyperlipidemia     Hypertension    Sleep apnea    uses CPAP nightly    Tobacco History: Social History   Tobacco Use  Smoking Status Former   Packs/day: 1.50   Years: 25.00   Pack years: 37.50   Types: Cigarettes   Quit date: 04/2020   Years since quitting: 1.0  Smokeless Tobacco Never   Counseling given: Not Answered   Outpatient Medications Prior to Visit  Medication Sig Dispense Refill   amLODipine (NORVASC) 10 MG tablet Take 1 tablet (10 mg total) by mouth at bedtime. 90 tablet 1   atorvastatin (LIPITOR) 10 MG tablet Take 10 mg by mouth daily.      CINNAMON PO Take 1,000 mg by mouth daily.     dapagliflozin propanediol (FARXIGA) 10 MG TABS tablet Take 1 tablet (10 mg total) by mouth daily before breakfast. 30 tablet 6   etodolac (LODINE XL) 400 MG 24 hr tablet TAKE ONE TABLET BY MOUTH DAILY 30 tablet 0   glucose blood test strip Use as instructed to test blood sugar 2 times daily E11.65    Onetouch strips 100 each 12   indapamide (LOZOL) 1.25 MG tablet TAKE 1 TABLET BY MOUTH DAILY 90 tablet 0   insulin degludec (TRESIBA FLEXTOUCH) 100 UNIT/ML FlexTouch Pen Inject 35 Units into the skin daily. 30 mL 3   losartan (COZAAR) 100 MG tablet Take 1 tablet (100 mg total) by mouth daily. 90 tablet 1   metFORMIN (GLUCOPHAGE) 1000 MG tablet TAKE ONE TABLET BY MOUTH EVERY MORNING AND AT BEDTIME 180 tablet 3   Multiple Vitamin (MULTIVITAMIN WITH MINERALS) TABS tablet Take 1 tablet by mouth daily.     Nebivolol HCl 20 MG TABS TAKE 1 TABLET BY MOUTH DAILY 90 tablet 0   Semaglutide, 1 MG/DOSE, (OZEMPIC, 1 MG/DOSE,) 4 MG/3ML SOPN Inject 1 mg into the skin once a week. 9 mL 3   spironolactone (ALDACTONE) 50 MG tablet Take 1 tablet (50 mg total) by mouth daily. 90 tablet 0   tadalafil (CIALIS) 20 MG tablet Take 20 mg by mouth daily as needed (ED).      Testosterone 20.25 MG/ACT (1.62%) GEL Place 2 Act onto the skin daily. 225 g 1   No facility-administered medications prior to visit.     Review of  Systems:   Constitutional:   No  weight loss, night sweats,  Fevers, chills, fatigue, or  lassitude.  HEENT:   No headaches,  Difficulty swallowing,  Tooth/dental problems, or  Sore throat,                No sneezing, itching, ear ache, nasal congestion, post nasal drip,   CV:  No chest pain,  Orthopnea, PND, swelling in lower extremities, anasarca, dizziness, palpitations, syncope.   GI  No heartburn, indigestion, abdominal pain, nausea, vomiting, diarrhea, change in bowel habits, loss of appetite, bloody stools.   Resp: No shortness of breath with exertion or at rest.  No excess mucus, no productive cough,  No non-productive cough,  No coughing up of blood.  No change in color of mucus.  No wheezing.  No chest wall deformity  Skin: no rash or lesions.  GU:  no dysuria, change in color of urine, no urgency or frequency.  No flank pain, no hematuria   MS:  No joint pain or swelling.  No decreased range of motion.  No back pain.    Physical Exam  BP 128/78 (BP Location: Left Arm, Patient Position: Sitting, Cuff Size: Normal)   Pulse 97   Temp 98.3 F (36.8 C) (Oral)   Ht '6\' 2"'$  (1.88 m)   Wt 272 lb 6.4 oz (123.6 kg)   SpO2 97%   BMI 34.97 kg/m   GEN: A/Ox3; pleasant , NAD, well nourished    HEENT:  [place]/AT,  NOSE-clear, THROAT-clear, no lesions, no postnasal drip or exudate noted.   NECK:  Supple w/ fair ROM; no JVD; normal carotid impulses w/o bruits; no thyromegaly or nodules palpated; no lymphadenopathy.    RESP  Clear  P & A; w/o, wheezes/ rales/ or rhonchi. no accessory muscle use, no dullness to percussion  CARD:  RRR, no m/r/g, no peripheral edema, pulses intact, no cyanosis or clubbing.  GI:   Soft & nt; nml bowel sounds; no organomegaly or masses detected.   Musco: Warm bil, no deformities or joint swelling noted.   Neuro: alert, no focal deficits noted.    Skin: Warm, no lesions or rashes        BNP No results found for: BNP  ProBNP No results found  for: PROBNP  Imaging: NM THYROID IMAGING  Result Date: 05/01/2021 CLINICAL DATA:  Mediastinal mass. Evaluate for thyroid tissue origin of substernal mass. EXAM: THYROID SCAN with SPECT TECHNIQUE: Following the intravenous administration of radiopharmaceutical, pin hole collimated images were obtained of the thyroid gland. SPECT imaging of the neck and chest was subsequently performed. RADIOPHARMACEUTICALS:  450 microcuries I 123 COMPARISON:  PET-CT 02/06/2021, CT chest 01/04/2021 FINDINGS: There is no significant radiotracer accumulation associated with the mediastinal mass in deep anterior mediastinum LEFT adjacent to the trachea. There is intense radiotracer accumulation within the thyroid gland. IMPRESSION: No iodine isotope accumulation within the upper mediastinal mass which indicates that the mass is NOT differentiated thyroid tissue origin. Electronically Signed   By: Suzy Bouchard M.D.   On: 05/01/2021 16:16   [place] FNA BX THYROID 1ST LESION AFIRMA  Result Date: 05/11/2021 INDICATION: Patient with history of thyroid ultrasound on 04/21/2021 which revealed a 6 cm nodule along the inferior aspect of the left thyroid lobe which meets criteria for biopsy. He presents today for the procedure. EXAM: ULTRASOUND GUIDED FINE NEEDLE ASPIRATION BIOPSY OF LEFT INFERIOR THYROID NODULE COMPARISON:  Thyroid ultrasound dated 04/21/2021 MEDICATIONS: 1% lidocaine to skin and subcutaneous tissue COMPLICATIONS: None immediate. TECHNIQUE: Informed written consent was obtained from the patient after a discussion of the risks, benefits and alternatives to treatment. Questions regarding the procedure were encouraged and answered. A timeout was performed prior to the initiation of the procedure. Pre-procedural ultrasound scanning demonstrated unchanged size and appearance of the indeterminate nodule within the left inferior thyroid lobe The procedure was planned. The neck was prepped in the usual sterile fashion, and a  sterile drape was applied covering the operative field. A timeout was performed prior to the initiation of the procedure. Local anesthesia was provided with 1% lidocaine. Under direct ultrasound guidance, 6 FNA biopsies were performed of the left inferior thyroid nodule with 22 gauge Inrad and 25 gauge needles. Multiple ultrasound images were saved for procedural documentation purposes. The samples were prepared and submitted to pathology. Limited post procedural scanning was negative for hematoma or additional complication.  Dressings were placed. The patient tolerated the above procedures procedure well without immediate postprocedural complication. FINDINGS: Nodule reference number based on prior diagnostic ultrasound: 1 Maximum size: 6.0 cm Location: Left; Inferior ACR TI-RADS risk category: TR3 (3 points) Reason for biopsy: meets ACR TI-RADS criteria Ultrasound imaging confirms appropriate placement of the needles within the thyroid nodule. IMPRESSION: Technically successful ultrasound guided fine needle aspiration biopsy of left inferior thyroid nodule. Final pathology pending. Read by: Rowe Robert, PA-C Electronically Signed   By: Aletta Edouard M.D.   On: 05/11/2021 16:03   US THYROID  Result Date: 04/21/2021 CLINICAL DATA:  Cervical lymphadenopathy and indeterminate mediastinal mass. EXAM: THYROID ULTRASOUND TECHNIQUE: Ultrasound examination of the thyroid gland and adjacent soft tissues was performed. COMPARISON:  Chest CT 01/04/2021.  PET-CT 02/06/2021 FINDINGS: Parenchymal Echotexture: Mildly heterogenous Isthmus: 0.8 cm Right lobe: 4.9 x 2.4 x 1.8 cm Left lobe: 8.9 x 4.8 x 3.3 cm _________________________________________________________ Estimated total number of nodules >/= 1 cm: 1 Number of spongiform nodules >/=  2 cm not described below (TR1): 0 Number of mixed cystic and solid nodules >/= 1.5 cm not described below ([place]): 0 _________________________________________________________ No discrete  right thyroid nodules. Area of concern in the left inferior thyroid lobe is difficult to evaluate due to the substernal location. However, there is a slightly heterogeneous isoechoic nodule extending from the inferior left thyroid lobe. This is likely contiguous with the thyroid lobe and represents a large thyroid nodule. This nodular area measures roughly 6.0 x 4.0 x 4.0 cm. This is most compatible with a TR 3 nodule. **Given size (>/= 2.5 cm) and appearance, fine needle aspiration of this mildly suspicious nodule should be considered based on TI-RADS criteria. Enlarged cervical lymph nodes on both sides of the neck. Index lymph node in the right submandibular region measures 1.9 cm in the short axis. Index lymph node on left side of the neck measures 1.1 cm in the short axis. IMPRESSION: 1. Heterogeneous nodule along the inferior aspect of the left thyroid lobe. Although this area is very difficult to image, the echogenicity is similar to the adjacent thyroid tissue and favor a large left thyroid nodule. The entire nodule is not imaged on this examination. Based on the size and echogenicity, this is compatible with a TR 3 nodule and would meet criteria for biopsy. 2. Several enlarged lymph nodes on both sides of the neck. These findings are similar to the prior PET-CT imaging. The above is in keeping with the ACR TI-RADS recommendations - J Am Coll Radiol 2017;14:587-595. Electronically Signed   By: Markus Daft M.D.   On: 04/21/2021 16:26      No flowsheet data found.  No results found for: NITRICOXIDE      Assessment & Plan:   OSA (obstructive sleep apnea) Severe obstructive sleep apnea.  CPAP download dyspneic rested.  Patient will bring back his ST card for download.  Patient is encouraged on CPAP compliance and uses.  Order for CPAP supplies sent to his local DME company  Plan  Patient Instructions  Continue on CPAP At bedtime   Wear all night long  Work on healthy weight  Do not drive if  sleepy  Bring SD card in for download  Order for supplies to [facility]   Will call you regarding upcoming procedure.   Follow up with Dr. 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  Patient underwent  a thyroid nodule biopsy.  Results were nondiagnostic. Patient will need to undergo EBUS for tissue diagnosis.  Risk and benefits reviewed with patient and wife.  Plan  Patient Instructions  Continue on CPAP At bedtime   Wear all night long  Work on healthy weight  Do not drive if sleepy  Bring SD card in for download  Order for supplies to [facility]   Will call you regarding upcoming procedure.   Follow up with Dr. Elsworth Soho in 6 months and As needed         I spent   41 minutes dedicated to the care of this patient on the date of this encounter to include pre-visit review of records, face-to-face time with the patient discussing conditions above, post visit ordering of testing, clinical documentation with the electronic health record, making appropriate referrals as documented, and communicating necessary findings to members of the patients care team.    Rexene Edison, NP 05/18/2021

## 2021-05-18 NOTE — Progress Notes (Signed)
$'@Patient'd$  ID: James Parrish, male    DOB: Jan 23, 1966, 55 y.o.   MRN: [id number]  Chief Complaint  Patient presents with   Follow-up    Referring provider: Sueanne Margarita, DO  HPI: 55 year old male former smoker followed for obstructive sleep apnea and mediastinal mass Medical history significant for insulin-dependent diabetes, hypertension Patient is a truck driver   TEST/EVENTS :  Lung cancer screening CT January 04, 2021 showed tiny pulmonary nodule right lower lobe, no other suspicious pulmonary nodules.  Large soft tissue mass extending in the middle of the mediastinum along the left peritracheal region measuring 5.1 x 3.6 x 8.5 cm.  Intimately associated with the left lobe of the thyroid gland, several prominent but not enlarged mediastinal lymph nodes largest measuring 1.2 cm in the prevascular nodal station, multiple borderline axillary lymph nodes with an enlarged right subpectoral lymph node measuring 2 cm, hepatic steatosis, mild emphysema  PET scan February 06, 2021 mild to moderate degree of abnormal adenopathy in the neck chest and upper abdomen, dominant left peritracheal mass, suspicious for a lymphoproliferative process rather than a thyroid lesion-SUV about 2.3.  Thyroid biopsy July 28, [id number] follicular epithelial present, nondiagnostic material.  HST 04/2020 severe OSA, AHI 79/hour, lowest desaturation 77% 06/2020 CPAP titration >> 16 cm   05/18/2021 Follow up : OSA and Mediastinal Mass  Patient presents for a follow-up visit.  Patient has severe obstructive sleep apnea.  He is on nocturnal CPAP.  Patient has recently got a new CPAP machine.  He purchased this out-of-pocket.  Patient says he is trying to wear it each night.  Does have some issues currently with his mask and supplies.  Needs an order to his homecare company for renewal of supplies.  Patient will need to bring in his CPAP SD card for download.  He does have the my air app.  This does show okay compliance on  average about 2 to 5 hours of usage.  AHI is 0.5. Patient is encouraged on compliance and usage. Patient has been out of work for the last year and a half due to medical issues.  He is getting ready to restart work.  Patient had a low-dose lung cancer screening CT in March.  This showed a large mediastinal mass measuring up to 8.5 cm.  Patient underwent a PET scan on February 06, 2021 that showed mild to moderate degree of abnormal adenopathy in the neck, chest, upper abdomen with a dominant left peritracheal mass.  Suspicious for possible lymphoproliferative process.  This is in very close proximity to thyroid.  Patient underwent a thyroid ultrasound.  Heterogenous nodule along the inferior aspect of the left thyroid nodule the echogenicity is similar to the adjacent thyroid tissue and favor a large left thyroid nodule.  Patient underwent a thyroid biopsy on May 11, 2021 that showed scant follicular epithelial present.  Nondiagnostic material. We went over his test results and discussed neck step in order to get tissue diagnosis.  Case has been discussed with Dr. Valeta Harms .  Patient will need to undergo a EBUS for tissue diagnosis  Went over risk and benefits. Patient says he feels well.  He denies any hemoptysis, unintentional weight loss cough, shortness of breath or dysphagia. Patient is not on anticoagulation.   No Known Allergies  Immunization History  Administered Date(s) Administered   Influenza, Seasonal, Injecte, Preservative Fre 08/15/2012, 08/26/2013   Influenza,inj,Quad PF,6+ Mos 08/07/2017, 08/11/2020   Influenza,inj,quad, With Preservative 09/29/2014, 08/10/2015   PFIZER(Purple Top)SARS-COV-2 Vaccination 02/13/2020,  03/07/2020, 09/16/2020   Tdap 10/16/2007, 11/24/2015    Past Medical History:  Diagnosis Date   Complication of anesthesia    hard to wake up    Diabetes mellitus without complication [facility])    ED (erectile dysfunction)    History of COVID-19 04/2019   Hyperlipidemia     Hypertension    Sleep apnea    uses CPAP nightly    Tobacco History: Social History   Tobacco Use  Smoking Status Former   Packs/day: 1.50   Years: 25.00   Pack years: 37.50   Types: Cigarettes   Quit date: 04/2020   Years since quitting: 1.0  Smokeless Tobacco Never   Counseling given: Not Answered   Outpatient Medications Prior to Visit  Medication Sig Dispense Refill   amLODipine (NORVASC) 10 MG tablet Take 1 tablet (10 mg total) by mouth at bedtime. 90 tablet 1   atorvastatin (LIPITOR) 10 MG tablet Take 10 mg by mouth daily.      CINNAMON PO Take 1,000 mg by mouth daily.     dapagliflozin propanediol (FARXIGA) 10 MG TABS tablet Take 1 tablet (10 mg total) by mouth daily before breakfast. 30 tablet 6   etodolac (LODINE XL) 400 MG 24 hr tablet TAKE ONE TABLET BY MOUTH DAILY 30 tablet 0   glucose blood test strip Use as instructed to test blood sugar 2 times daily E11.65    Onetouch strips 100 each 12   indapamide (LOZOL) 1.25 MG tablet TAKE 1 TABLET BY MOUTH DAILY 90 tablet 0   insulin degludec (TRESIBA FLEXTOUCH) 100 UNIT/ML FlexTouch Pen Inject 35 Units into the skin daily. 30 mL 3   losartan (COZAAR) 100 MG tablet Take 1 tablet (100 mg total) by mouth daily. 90 tablet 1   metFORMIN (GLUCOPHAGE) 1000 MG tablet TAKE ONE TABLET BY MOUTH EVERY MORNING AND AT BEDTIME 180 tablet 3   Multiple Vitamin (MULTIVITAMIN WITH MINERALS) TABS tablet Take 1 tablet by mouth daily.     Nebivolol HCl 20 MG TABS TAKE 1 TABLET BY MOUTH DAILY 90 tablet 0   Semaglutide, 1 MG/DOSE, (OZEMPIC, 1 MG/DOSE,) 4 MG/3ML SOPN Inject 1 mg into the skin once a week. 9 mL 3   spironolactone (ALDACTONE) 50 MG tablet Take 1 tablet (50 mg total) by mouth daily. 90 tablet 0   tadalafil (CIALIS) 20 MG tablet Take 20 mg by mouth daily as needed (ED).      Testosterone 20.25 MG/ACT (1.62%) GEL Place 2 Act onto the skin daily. 225 g 1   No facility-administered medications prior to visit.     Review of  Systems:   Constitutional:   No  weight loss, night sweats,  Fevers, chills, fatigue, or  lassitude.  HEENT:   No headaches,  Difficulty swallowing,  Tooth/dental problems, or  Sore throat,                No sneezing, itching, ear ache, nasal congestion, post nasal drip,   CV:  No chest pain,  Orthopnea, PND, swelling in lower extremities, anasarca, dizziness, palpitations, syncope.   GI  No heartburn, indigestion, abdominal pain, nausea, vomiting, diarrhea, change in bowel habits, loss of appetite, bloody stools.   Resp: No shortness of breath with exertion or at rest.  No excess mucus, no productive cough,  No non-productive cough,  No coughing up of blood.  No change in color of mucus.  No wheezing.  No chest wall deformity  Skin: no rash or lesions.  GU:  no dysuria, change in color of urine, no urgency or frequency.  No flank pain, no hematuria   MS:  No joint pain or swelling.  No decreased range of motion.  No back pain.    Physical Exam  BP 128/78 (BP Location: Left Arm, Patient Position: Sitting, Cuff Size: Normal)   Pulse 97   Temp 98.3 F (36.8 C) (Oral)   Ht '6\' 2"'$  (1.88 m)   Wt 272 lb 6.4 oz (123.6 kg)   SpO2 97%   BMI 34.97 kg/m   GEN: A/Ox3; pleasant , NAD, well nourished    HEENT:  [place]/AT,  NOSE-clear, THROAT-clear, no lesions, no postnasal drip or exudate noted.   NECK:  Supple w/ fair ROM; no JVD; normal carotid impulses w/o bruits; no thyromegaly or nodules palpated; no lymphadenopathy.    RESP  Clear  P & A; w/o, wheezes/ rales/ or rhonchi. no accessory muscle use, no dullness to percussion  CARD:  RRR, no m/r/g, no peripheral edema, pulses intact, no cyanosis or clubbing.  GI:   Soft & nt; nml bowel sounds; no organomegaly or masses detected.   Musco: Warm bil, no deformities or joint swelling noted.   Neuro: alert, no focal deficits noted.    Skin: Warm, no lesions or rashes        BNP No results found for: BNP  ProBNP No results found  for: PROBNP  Imaging: NM THYROID IMAGING  Result Date: 05/01/2021 CLINICAL DATA:  Mediastinal mass. Evaluate for thyroid tissue origin of substernal mass. EXAM: THYROID SCAN with SPECT TECHNIQUE: Following the intravenous administration of radiopharmaceutical, pin hole collimated images were obtained of the thyroid gland. SPECT imaging of the neck and chest was subsequently performed. RADIOPHARMACEUTICALS:  450 microcuries I 123 COMPARISON:  PET-CT 02/06/2021, CT chest 01/04/2021 FINDINGS: There is no significant radiotracer accumulation associated with the mediastinal mass in deep anterior mediastinum LEFT adjacent to the trachea. There is intense radiotracer accumulation within the thyroid gland. IMPRESSION: No iodine isotope accumulation within the upper mediastinal mass which indicates that the mass is NOT differentiated thyroid tissue origin. Electronically Signed   By: Suzy Bouchard M.D.   On: 05/01/2021 16:16   [place] FNA BX THYROID 1ST LESION AFIRMA  Result Date: 05/11/2021 INDICATION: Patient with history of thyroid ultrasound on 04/21/2021 which revealed a 6 cm nodule along the inferior aspect of the left thyroid lobe which meets criteria for biopsy. He presents today for the procedure. EXAM: ULTRASOUND GUIDED FINE NEEDLE ASPIRATION BIOPSY OF LEFT INFERIOR THYROID NODULE COMPARISON:  Thyroid ultrasound dated 04/21/2021 MEDICATIONS: 1% lidocaine to skin and subcutaneous tissue COMPLICATIONS: None immediate. TECHNIQUE: Informed written consent was obtained from the patient after a discussion of the risks, benefits and alternatives to treatment. Questions regarding the procedure were encouraged and answered. A timeout was performed prior to the initiation of the procedure. Pre-procedural ultrasound scanning demonstrated unchanged size and appearance of the indeterminate nodule within the left inferior thyroid lobe The procedure was planned. The neck was prepped in the usual sterile fashion, and a  sterile drape was applied covering the operative field. A timeout was performed prior to the initiation of the procedure. Local anesthesia was provided with 1% lidocaine. Under direct ultrasound guidance, 6 FNA biopsies were performed of the left inferior thyroid nodule with 22 gauge Inrad and 25 gauge needles. Multiple ultrasound images were saved for procedural documentation purposes. The samples were prepared and submitted to pathology. Limited post procedural scanning was negative for hematoma or additional complication.  Dressings were placed. The patient tolerated the above procedures procedure well without immediate postprocedural complication. FINDINGS: Nodule reference number based on prior diagnostic ultrasound: 1 Maximum size: 6.0 cm Location: Left; Inferior ACR TI-RADS risk category: TR3 (3 points) Reason for biopsy: meets ACR TI-RADS criteria Ultrasound imaging confirms appropriate placement of the needles within the thyroid nodule. IMPRESSION: Technically successful ultrasound guided fine needle aspiration biopsy of left inferior thyroid nodule. Final pathology pending. Read by: Rowe Robert, PA-C Electronically Signed   By: Aletta Edouard M.D.   On: 05/11/2021 16:03   US THYROID  Result Date: 04/21/2021 CLINICAL DATA:  Cervical lymphadenopathy and indeterminate mediastinal mass. EXAM: THYROID ULTRASOUND TECHNIQUE: Ultrasound examination of the thyroid gland and adjacent soft tissues was performed. COMPARISON:  Chest CT 01/04/2021.  PET-CT 02/06/2021 FINDINGS: Parenchymal Echotexture: Mildly heterogenous Isthmus: 0.8 cm Right lobe: 4.9 x 2.4 x 1.8 cm Left lobe: 8.9 x 4.8 x 3.3 cm _________________________________________________________ Estimated total number of nodules >/= 1 cm: 1 Number of spongiform nodules >/=  2 cm not described below (TR1): 0 Number of mixed cystic and solid nodules >/= 1.5 cm not described below (Ezel): 0 _________________________________________________________ No discrete  right thyroid nodules. Area of concern in the left inferior thyroid lobe is difficult to evaluate due to the substernal location. However, there is a slightly heterogeneous isoechoic nodule extending from the inferior left thyroid lobe. This is likely contiguous with the thyroid lobe and represents a large thyroid nodule. This nodular area measures roughly 6.0 x 4.0 x 4.0 cm. This is most compatible with a TR 3 nodule. **Given size (>/= 2.5 cm) and appearance, fine needle aspiration of this mildly suspicious nodule should be considered based on TI-RADS criteria. Enlarged cervical lymph nodes on both sides of the neck. Index lymph node in the right submandibular region measures 1.9 cm in the short axis. Index lymph node on left side of the neck measures 1.1 cm in the short axis. IMPRESSION: 1. Heterogeneous nodule along the inferior aspect of the left thyroid lobe. Although this area is very difficult to image, the echogenicity is similar to the adjacent thyroid tissue and favor a large left thyroid nodule. The entire nodule is not imaged on this examination. Based on the size and echogenicity, this is compatible with a TR 3 nodule and would meet criteria for biopsy. 2. Several enlarged lymph nodes on both sides of the neck. These findings are similar to the prior PET-CT imaging. The above is in keeping with the ACR TI-RADS recommendations - J Am Coll Radiol 2017;14:587-595. Electronically Signed   By: Markus Daft M.D.   On: 04/21/2021 16:26      No flowsheet data found.  No results found for: NITRICOXIDE      Assessment & Plan:   OSA (obstructive sleep apnea) Severe obstructive sleep apnea.  CPAP download dyspneic rested.  Patient will bring back his ST card for download.  Patient is encouraged on CPAP compliance and uses.  Order for CPAP supplies sent to his local DME company  Plan  Patient Instructions  Continue on CPAP At bedtime   Wear all night long  Work on healthy weight  Do not drive if  sleepy  Bring SD card in for download  Order for supplies to [facility]   Will call you regarding upcoming procedure.   Follow up with Dr. 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  Patient underwent  a thyroid nodule biopsy.  Results were nondiagnostic. Patient will need to undergo EBUS for tissue diagnosis.  Risk and benefits reviewed with patient and wife.  Plan  Patient Instructions  Continue on CPAP At bedtime   Wear all night long  Work on healthy weight  Do not drive if sleepy  Bring SD card in for download  Order for supplies to [facility]   Will call you regarding upcoming procedure.   Follow up with Dr. Elsworth Soho in 6 months and As needed         I spent   41 minutes dedicated to the care of this patient on the date of this encounter to include pre-visit review of records, face-to-face time with the patient discussing conditions above, post visit ordering of testing, clinical documentation with the electronic health record, making appropriate referrals as documented, and communicating necessary findings to members of the patients care team.    Rexene Edison, NP 05/18/2021

## 2021-05-18 NOTE — Assessment & Plan Note (Signed)
Severe obstructive sleep apnea.  CPAP download dyspneic rested.  Patient will bring back his ST card for download.  Patient is encouraged on CPAP compliance and uses.  Order for CPAP supplies sent to his local DME company  Plan  Patient Instructions  Continue on CPAP At bedtime   Wear all night long  Work on healthy weight  Do not drive if sleepy  Bring SD card in for download  Order for supplies to [facility]   Will call you regarding upcoming procedure.   Follow up with Dr. Elsworth Soho in 6 months and As needed

## 2021-05-18 NOTE — Patient Instructions (Addendum)
Continue on CPAP At bedtime   Wear all night long  Work on healthy weight  Do not drive if sleepy  Bring SD card in for download  Order for supplies to [facility]   Will call you regarding upcoming procedure.   Follow up with Dr. Elsworth Soho in 6 months and As needed

## 2021-05-19 ENCOUNTER — Telehealth: Payer: Self-pay | Admitting: Pulmonary Disease

## 2021-05-19 DIAGNOSIS — J9859 Other diseases of mediastinum, not elsewhere classified: Secondary | ICD-10-CM

## 2021-05-19 NOTE — Telephone Encounter (Signed)
PCCM:  Discussed case with surgery, thoracics and endocrinology. We will plan for bronchoscopy with EBUS.   Garner Nash, DO [facility] 05/19/2021 5:28 PM

## 2021-05-22 NOTE — Telephone Encounter (Signed)
Pt has been scheduled for 8/15 at 1:15.  He will go for covid test on 8/12.  Spoke to pt & gave him appt info.

## 2021-05-26 ENCOUNTER — Encounter (HOSPITAL_COMMUNITY): Payer: Self-pay | Admitting: Pulmonary Disease

## 2021-05-26 ENCOUNTER — Other Ambulatory Visit: Payer: Self-pay | Admitting: Pulmonary Disease

## 2021-05-26 ENCOUNTER — Other Ambulatory Visit: Payer: Self-pay

## 2021-05-26 NOTE — Progress Notes (Signed)
Spoke with pt for pre-op call. Pt denies cardiac history. Pt is treated for HTN and Type 2 diabetes. Pt states his fasting blood sugar is usually between 100-120. Last A1C was 6.8 on 04/11/21. Instructed pt to take 1/2 of his regular dose of Tresiba Insulin Monday AM, he will take 17 units. Instructed pt to check his blood sugar when he gets up and every two hours until he leaves for the hospital. If blood sugar is 70 or below, treat with 1/2 cup of clear juice (apple or cranberry) and recheck blood sugar 15 minutes after drinking juice. If blood sugar continues to be 70 or below, call the Short Stay department and ask to speak to a nurse. Pt voiced understanding. Pt also instructed to hold Farxiga Sunday and Monday AM and hold Metformin Monday AM.   Covid test done today.

## 2021-05-27 LAB — SARS CORONAVIRUS 2 (TAT 6-24 HRS): SARS Coronavirus 2: NEGATIVE

## 2021-05-29 ENCOUNTER — Ambulatory Visit (HOSPITAL_COMMUNITY): Payer: 59 | Admitting: Certified Registered Nurse Anesthetist

## 2021-05-29 ENCOUNTER — Other Ambulatory Visit: Payer: Self-pay

## 2021-05-29 ENCOUNTER — Ambulatory Visit (HOSPITAL_COMMUNITY)
Admission: RE | Admit: 2021-05-29 | Discharge: 2021-05-29 | Disposition: A | Discharge status: Home / Self Care | Payer: 59 | Source: Ambulatory Visit | Attending: Pulmonary Disease | Admitting: Pulmonary Disease

## 2021-05-29 ENCOUNTER — Encounter (HOSPITAL_COMMUNITY)
Admission: RE | Disposition: A | Discharge status: Home / Self Care | Payer: Self-pay | Source: Ambulatory Visit | Attending: Pulmonary Disease

## 2021-05-29 ENCOUNTER — Encounter (HOSPITAL_COMMUNITY): Payer: Self-pay | Admitting: Pulmonary Disease

## 2021-05-29 DIAGNOSIS — Z9889 Other specified postprocedural states: Secondary | ICD-10-CM

## 2021-05-29 DIAGNOSIS — Z87891 Personal history of nicotine dependence: Secondary | ICD-10-CM | POA: Insufficient documentation

## 2021-05-29 DIAGNOSIS — E119 Type 2 diabetes mellitus without complications: Secondary | ICD-10-CM | POA: Diagnosis not present

## 2021-05-29 DIAGNOSIS — Z794 Long term (current) use of insulin: Secondary | ICD-10-CM | POA: Insufficient documentation

## 2021-05-29 DIAGNOSIS — Z8616 Personal history of COVID-19: Secondary | ICD-10-CM | POA: Diagnosis not present

## 2021-05-29 DIAGNOSIS — Z79899 Other long term (current) drug therapy: Secondary | ICD-10-CM | POA: Diagnosis not present

## 2021-05-29 DIAGNOSIS — R590 Localized enlarged lymph nodes: Secondary | ICD-10-CM | POA: Insufficient documentation

## 2021-05-29 DIAGNOSIS — J9859 Other diseases of mediastinum, not elsewhere classified: Secondary | ICD-10-CM | POA: Diagnosis present

## 2021-05-29 DIAGNOSIS — G4733 Obstructive sleep apnea (adult) (pediatric): Secondary | ICD-10-CM | POA: Diagnosis not present

## 2021-05-29 DIAGNOSIS — Z7984 Long term (current) use of oral hypoglycemic drugs: Secondary | ICD-10-CM | POA: Insufficient documentation

## 2021-05-29 HISTORY — PX: VIDEO BRONCHOSCOPY WITH ENDOBRONCHIAL ULTRASOUND: SHX6177

## 2021-05-29 HISTORY — PX: BRONCHIAL NEEDLE ASPIRATION BIOPSY: SHX5106

## 2021-05-29 LAB — GLUCOSE, CAPILLARY
Glucose-Capillary: 136 mg/dL — ABNORMAL HIGH (ref 70–99)
Glucose-Capillary: 138 mg/dL — ABNORMAL HIGH (ref 70–99)
Glucose-Capillary: 148 mg/dL — ABNORMAL HIGH (ref 70–99)

## 2021-05-29 LAB — CBC
HCT: 47.9 % (ref 39.0–52.0)
Hemoglobin: 15.4 g/dL (ref 13.0–17.0)
MCH: 24.7 pg — ABNORMAL LOW (ref 26.0–34.0)
MCHC: 32.2 g/dL (ref 30.0–36.0)
MCV: 76.9 fL — ABNORMAL LOW (ref 80.0–100.0)
Platelets: 203 10*3/uL (ref 150–400)
RBC: 6.23 MIL/uL — ABNORMAL HIGH (ref 4.22–5.81)
RDW: 15.8 % — ABNORMAL HIGH (ref 11.5–15.5)
WBC: 13.3 10*3/uL — ABNORMAL HIGH (ref 4.0–10.5)
nRBC: 0 % (ref 0.0–0.2)

## 2021-05-29 SURGERY — BRONCHOSCOPY, WITH EBUS
Anesthesia: General | Laterality: Left

## 2021-05-29 MED ORDER — CHLORHEXIDINE GLUCONATE 0.12 % MT SOLN
15.0000 mL | Freq: Once | OROMUCOSAL | Status: AC
  Administered 2021-05-29: 15 mL via OROMUCOSAL
  Filled 2021-05-29 (×2): qty 15

## 2021-05-29 MED ORDER — SUGAMMADEX SODIUM 200 MG/2ML IV SOLN
INTRAVENOUS | Status: DC | PRN
  Administered 2021-05-29: 100 mg via INTRAVENOUS
  Administered 2021-05-29: 200 mg via INTRAVENOUS

## 2021-05-29 MED ORDER — PROPOFOL 10 MG/ML IV BOLUS
INTRAVENOUS | Status: DC | PRN
  Administered 2021-05-29 (×2): 50 mg via INTRAVENOUS
  Administered 2021-05-29: 100 mg via INTRAVENOUS

## 2021-05-29 MED ORDER — DEXAMETHASONE SODIUM PHOSPHATE 10 MG/ML IJ SOLN
INTRAMUSCULAR | Status: DC | PRN
  Administered 2021-05-29: 10 mg via INTRAVENOUS

## 2021-05-29 MED ORDER — ONDANSETRON HCL 4 MG/2ML IJ SOLN
INTRAMUSCULAR | Status: DC | PRN
  Administered 2021-05-29: 4 mg via INTRAVENOUS

## 2021-05-29 MED ORDER — ROCURONIUM BROMIDE 10 MG/ML (PF) SYRINGE
PREFILLED_SYRINGE | INTRAVENOUS | Status: DC | PRN
  Administered 2021-05-29: 60 mg via INTRAVENOUS

## 2021-05-29 MED ORDER — FENTANYL CITRATE (PF) 100 MCG/2ML IJ SOLN
INTRAMUSCULAR | Status: DC | PRN
  Administered 2021-05-29 (×2): 50 ug via INTRAVENOUS

## 2021-05-29 MED ORDER — LACTATED RINGERS IV SOLN: INTRAVENOUS | Status: DC

## 2021-05-29 MED ORDER — LIDOCAINE 2% (20 MG/ML) 5 ML SYRINGE
INTRAMUSCULAR | Status: DC | PRN
  Administered 2021-05-29: 60 mg via INTRAVENOUS

## 2021-05-29 MED ORDER — MIDAZOLAM HCL 5 MG/5ML IJ SOLN
INTRAMUSCULAR | Status: DC | PRN
  Administered 2021-05-29 (×2): 1 mg via INTRAVENOUS

## 2021-05-29 SURGICAL SUPPLY — 29 items

## 2021-05-29 NOTE — Op Note (Signed)
Video Bronchoscopy with Endobronchial Ultrasound Procedure Note  Date of Operation: 05/29/2021  Pre-op Diagnosis: Paratracheal mass  Post-op Diagnosis: Paratracheal mass  Surgeon: Garner Nash, DO   Assistants: None  Anesthesia: General endotracheal anesthesia  Operation: Flexible video fiberoptic bronchoscopy with endobronchial ultrasound and biopsies.  Estimated Blood Loss: Minimal  Complications: None   Indications and History: James Parrish is a 55 y.o. male with paratracheal mass.  The risks, benefits, complications, treatment options and expected outcomes were discussed with the patient.  The possibilities of pneumothorax, pneumonia, reaction to medication, pulmonary aspiration, perforation of a viscus, bleeding, failure to diagnose a condition and creating a complication requiring transfusion or operation were discussed with the patient who freely signed the consent.    Description of Procedure: The patient was examined in the preoperative area and history and data from the preprocedure consultation were reviewed. It was deemed appropriate to proceed.  The patient was taken to [facility] endoscopy room 3, identified as James Parrish and the procedure verified as Flexible Video Fiberoptic Bronchoscopy.  A Time Out was held and the above information confirmed. After being taken to the operating room general anesthesia was initiated and the patient  was orally intubated. The video fiberoptic bronchoscope was introduced via the endotracheal tube and a general inspection was performed which showed normal right and left lung anatomy no evidence of endobronchial lesion. The standard scope was then withdrawn and the endobronchial ultrasound was used to identify and characterize the peritracheal, hilar and bronchial lymph nodes. Inspection showed enlarged paratracheal mass.  Lesion appeared solid under ultrasound, lesion was also inspected under Doppler.  There was no visible vessels within the  solid appearing lesion. Using real-time ultrasound guidance Wang needle biopsies were take from paratracheal mass and were sent for cytology.  Standard therapeutic bronchoscope was reinserted to the patient's airways for airway inspection.  There was blood tracking down and clot formation along the posterior wall into the left mainstem.  The bilateral mainstem's were aspirated clear and saline was used for irrigation for removal of any remaining blood clots and debris.  There is no evidence of active bleeding coming from the needle insertion site. The patient tolerated the procedure well without apparent complications. There was no significant blood loss. The bronchoscope was withdrawn. Anesthesia was reversed and the patient was taken to the PACU for recovery.   Samples: 1. Wang needle biopsies from paratracheal mass  Plans:  The patient will be discharged from the PACU to home when recovered from anesthesia. We will review the cytology, pathology and microbiology results with the patient when they become available. Outpatient followup will be with Garner Nash, DO.   Garner Nash, DO [facility] 05/29/2021 2:33 PM

## 2021-05-29 NOTE — Interval H&P Note (Signed)
History and Physical Interval Note:  05/29/2021 1:10 PM  James Parrish  has presented today for surgery, with the diagnosis of mediastinal mass.  The various methods of treatment have been discussed with the patient and family. After consideration of risks, benefits and other options for treatment, the patient has consented to  Procedure(s): [place] (Left) as a surgical intervention.  The patient's history has been reviewed, patient examined, no change in status, stable for surgery.  I have reviewed the patient's chart and labs.  Questions were answered to the patient's satisfaction.     [place]

## 2021-05-29 NOTE — Anesthesia Procedure Notes (Addendum)
Procedure Name: Intubation Date/Time: 05/29/2021 1:39 PM Performed by: Janene Harvey, CRNA Pre-anesthesia Checklist: Patient identified, Emergency Drugs available, Suction available and Patient being monitored Patient Re-evaluated:Patient Re-evaluated prior to induction Oxygen Delivery Method: Circle system utilized Preoxygenation: Pre-oxygenation with 100% oxygen Induction Type: IV induction Ventilation: Mask ventilation without difficulty and Oral airway inserted - appropriate to patient size Laryngoscope Size: Glidescope and 4 Grade View: Grade I Tube type: Oral Tube size: 8.5 mm Number of attempts: 1 Airway Equipment and Method: Stylet and Oral airway Placement Confirmation: ETT inserted through vocal cords under direct vision, positive ETCO2 and breath sounds checked- equal and bilateral Secured at: 24 cm Tube secured with: Tape Dental Injury: Teeth and Oropharynx as per pre-operative assessment  Comments: DL with MAC 4, grade III view/large epiglottis, unable to pass 8.5 ett. Pt masked with sevo, DL with glidescope 4, grade I, 8.5 ett passed

## 2021-05-29 NOTE — Discharge Instructions (Signed)
Flexible Bronchoscopy, Care After This sheet gives you information about how to care for yourself after your test. Your doctor may also give you more specific instructions. If you have problems or questions, contact your doctor. Follow these instructions at home: Eating and drinking Do not eat or drink anything (not even water) for 2 hours after your test, or until your numbing medicine (local anesthetic) wears off. When your numbness is gone and your cough and gag reflexes have come back, you may: Eat only soft foods. Slowly drink liquids. The day after the test, go back to your normal diet. Driving Do not drive for 24 hours if you were given a medicine to help you relax (sedative). Do not drive or use heavy machinery while taking prescription pain medicine. General instructions  Take over-the-counter and prescription medicines only as told by your doctor. Return to your normal activities as told. Ask what activities are safe for you. Do not use any products that have nicotine or tobacco in them. This includes cigarettes and e-cigarettes. If you need help quitting, ask your doctor. Keep all follow-up visits as told by your doctor. This is important. It is very important if you had a tissue sample (biopsy) taken. Get help right away if: You have shortness of breath that gets worse. You get light-headed. You feel like you are going to pass out (faint). You have chest pain. You cough up: More than a little blood. More blood than before. Summary Do not eat or drink anything (not even water) for 2 hours after your test, or until your numbing medicine wears off. Do not use cigarettes. Do not use e-cigarettes. Get help right away if you have chest pain.  This information is not intended to replace advice given to you by your health care provider. Make sure you discuss any questions you have with your health care provider. Document Released: 07/29/2009 Document Revised: 09/13/2017 Document  Reviewed: 10/19/2016 Elsevier Patient Education  2020 [employer].

## 2021-05-29 NOTE — Anesthesia Postprocedure Evaluation (Signed)
Anesthesia Post Note  Patient: James Parrish  Procedure(s) Performed: VIDEO BRONCHOSCOPY WITH ENDOBRONCHIAL ULTRASOUND (Left) BRONCHIAL NEEDLE ASPIRATION BIOPSIES     Patient location during evaluation: PACU Anesthesia Type: General Level of consciousness: awake and alert Pain management: pain level controlled Vital Signs Assessment: post-procedure vital signs reviewed and stable Respiratory status: spontaneous breathing, nonlabored ventilation and respiratory function stable Cardiovascular status: blood pressure returned to baseline and stable Postop Assessment: no apparent nausea or vomiting Anesthetic complications: no   No notable events documented.  Last Vitals:  Vitals:   05/29/21 1456 05/29/21 1508  BP: (!) 147/98 (!) 136/98  Pulse: 87 84  Resp: 16 10  Temp:    SpO2: 97% 96%    Last Pain:  Vitals:   05/29/21 1508  TempSrc:   PainSc: 0-No pain                 Willena Jeancharles,W. EDMOND

## 2021-05-29 NOTE — Transfer of Care (Signed)
Immediate Anesthesia Transfer of Care Note  Patient: James Parrish  Procedure(s) Performed: VIDEO BRONCHOSCOPY WITH ENDOBRONCHIAL ULTRASOUND (Left) BRONCHIAL NEEDLE ASPIRATION BIOPSIES  Patient Location: Endoscopy Unit  Anesthesia Type:General  Level of Consciousness: drowsy and patient cooperative  Airway & Oxygen Therapy: Patient Spontanous Breathing and Patient connected to face mask oxygen  Post-op Assessment: Report given to RN and Post -op Vital signs reviewed and stable  Post vital signs: Reviewed  Last Vitals:  Vitals Value Taken Time  BP 150/86 05/29/21 1436  Temp 36.5 C 05/29/21 1436  Pulse 86 05/29/21 1436  Resp 19 05/29/21 1436  SpO2 96 % 05/29/21 1436  Vitals shown include unvalidated device data.  Last Pain:  Vitals:   05/29/21 1436  TempSrc: Temporal  PainSc: 0-No pain      Patients Stated Pain Goal: 1 ([id number])  Complications: No notable events documented.

## 2021-05-29 NOTE — Anesthesia Preprocedure Evaluation (Signed)
Anesthesia Evaluation  Patient identified by MRN, date of birth, ID band Patient awake    Reviewed: Allergy & Precautions, NPO status , Patient's Chart, lab work & pertinent test results, reviewed documented beta blocker date and time   Airway Mallampati: II  TM Distance: >3 FB Neck ROM: Full    Dental no notable dental hx.    Pulmonary sleep apnea , former smoker,    Pulmonary exam normal breath sounds clear to auscultation       Cardiovascular hypertension, Pt. on medications and Pt. on home beta blockers Normal cardiovascular exam Rhythm:Regular Rate:Normal     Neuro/Psych negative neurological ROS  negative psych ROS   GI/Hepatic negative GI ROS, Neg liver ROS,   Endo/Other  diabetes, Type 2, Oral Hypoglycemic Agents  Renal/GU negative Renal ROS  negative genitourinary   Musculoskeletal negative musculoskeletal ROS (+)   Abdominal (+) + obese,   Peds negative pediatric ROS (+)  Hematology negative hematology ROS (+)   Anesthesia Other Findings   Reproductive/Obstetrics negative OB ROS                             Anesthesia Physical  Anesthesia Plan  ASA: III  Anesthesia Plan: General   Post-op Pain Management:    Induction: Intravenous  PONV Risk Score and Plan: 2 and Ondansetron, Treatment may vary due to age or medical condition and Midazolam  Airway Management Planned: Oral ETT  Additional Equipment:   Intra-op Plan:   Post-operative Plan: Extubation in OR  Informed Consent: I have reviewed the patients History and Physical, chart, labs and discussed the procedure including the risks, benefits and alternatives for the proposed anesthesia with the patient or authorized representative who has indicated his/her understanding and acceptance.     Dental advisory given  Plan Discussed with: CRNA and Surgeon  Anesthesia Plan Comments:         Anesthesia Quick  Evaluation

## 2021-05-30 ENCOUNTER — Encounter (HOSPITAL_COMMUNITY): Payer: Self-pay | Admitting: Pulmonary Disease

## 2021-05-30 LAB — CYTOLOGY - NON PAP

## 2021-06-01 NOTE — Progress Notes (Signed)
Hello all,  Path results from [facility] are negative for malignancy. Since this is PET negative and thyroid scan negative should we just follow this over time? Or consider removal? He is asymptomatic at baseline.   Thanks,  BLI  James Nash, DO [facility] 06/01/2021 5:01 PM

## 2021-06-12 ENCOUNTER — Telehealth: Payer: Self-pay | Admitting: Internal Medicine

## 2021-06-12 ENCOUNTER — Other Ambulatory Visit: Payer: Self-pay | Admitting: Internal Medicine

## 2021-06-12 NOTE — Telephone Encounter (Signed)
1.Medication Requested: tadalafil (CIALIS) 20 MG tablet  2. Pharmacy (Name, [place]): [facility] [id number] - [place], [place]  Phone:  [phone] Fax:  [phone]   3. On Med List: yes  4. Last Visit with PCP: 04.19.22  5. Next visit date with PCP: n/a   Agent: Please be advised that RX refills may take up to 3 business days. We ask that you follow-up with your pharmacy.

## 2021-06-13 NOTE — Telephone Encounter (Signed)
Called pt, LVM.   

## 2021-06-19 ENCOUNTER — Other Ambulatory Visit: Payer: Self-pay | Admitting: Internal Medicine

## 2021-06-19 DIAGNOSIS — I10 Essential (primary) hypertension: Secondary | ICD-10-CM

## 2021-06-20 ENCOUNTER — Other Ambulatory Visit: Payer: Self-pay | Admitting: Pulmonary Disease

## 2021-06-20 ENCOUNTER — Other Ambulatory Visit: Payer: Self-pay | Admitting: *Deleted

## 2021-06-20 DIAGNOSIS — Z87891 Personal history of nicotine dependence: Secondary | ICD-10-CM

## 2021-06-20 DIAGNOSIS — J9859 Other diseases of mediastinum, not elsewhere classified: Secondary | ICD-10-CM

## 2021-06-20 NOTE — Progress Notes (Signed)
I called patient to let him know about the referral and the results per Dr. Valeta Harms. Office number given.

## 2021-06-20 NOTE — Progress Notes (Signed)
Orders will be placed for referral to Dr. Kipp Brood.

## 2021-06-28 ENCOUNTER — Other Ambulatory Visit: Payer: Self-pay | Admitting: Internal Medicine

## 2021-06-28 ENCOUNTER — Telehealth: Payer: Self-pay | Admitting: Internal Medicine

## 2021-06-28 DIAGNOSIS — N5201 Erectile dysfunction due to arterial insufficiency: Secondary | ICD-10-CM

## 2021-06-28 MED ORDER — TADALAFIL 20 MG PO TABS: 20.0000 mg | ORAL_TABLET | Freq: Every day | ORAL | 1 refills | Status: DC | PRN

## 2021-06-28 NOTE — Telephone Encounter (Signed)
1.Medication Requested: tadalafil (CIALIS) 20 MG tablet  2. Pharmacy (Name, [place]): [facility] [id number] - [place], [place]  Phone:  [phone] Fax:  [phone]   3. On Med List: yes  4. Last Visit with PCP: 04.19.22  5. Next visit date with PCP: n/a   Agent: Please be advised that RX refills may take up to 3 business days. We ask that you follow-up with your pharmacy.

## 2021-06-29 ENCOUNTER — Other Ambulatory Visit: Payer: Self-pay | Admitting: Internal Medicine

## 2021-06-29 DIAGNOSIS — I10 Essential (primary) hypertension: Secondary | ICD-10-CM

## 2021-07-03 ENCOUNTER — Telehealth: Payer: Self-pay | Admitting: Pharmacy Technician

## 2021-07-03 NOTE — Telephone Encounter (Addendum)
Patient Advocate Encounter   Received notification from [place] that prior authorization for [facility] is required.   PA submitted on 07/03/2021 Key [id number]: [id number] Status is [facility] will continue to follow   Ronney Asters, CPhT Patient Advocate [facility] Phone: [phone] Fax:  [phone]

## 2021-07-04 NOTE — Progress Notes (Signed)
[address]       ,[place] [address]             [phone]                    Hardin E Boyar Arley Medical Record #[id number] Date of Birth: 07-18-1966  Referring: Garner Nash, DO Primary Care: Janith Lima, MD Primary Cardiologist: None  Chief Complaint:    Chief Complaint  Patient presents with   Mediastinal Mass    Surgical consult, Chest CT 01/04/21, PET Scan 02/06/21    History of Present Illness:    James Parrish 55 y.o. 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  The patient denies any symptoms of dysphagia or shortness of breath.  He denies any neurologic symptoms or muscle weakness.  He is undergone extensive work-up which is included a thyroid uptake scan which has been negative for uptake within this portion of the mass as well as a biopsy transbronchially which is also negative.  Patient states that he is concerned and would like this removed.      Zubrod Score: At the time of surgery this patient's most appropriate activity status/level should be described as: [x]     0    Normal activity, no symptoms []     1    Restricted in physical strenuous activity but ambulatory, able to do out light work []     2    Ambulatory and capable of self care, unable to do work activities, up and about               >50 % of waking hours                              []     3    Only limited self care, in bed greater than 50% of waking hours []     4    Completely disabled, no self care, confined to bed or chair []     5    Moribund   Past Medical History:  Diagnosis Date   Complication of anesthesia    hard to wake up    Diabetes mellitus without complication [facility])    ED (erectile dysfunction)    History of COVID-19 04/2019   Hyperlipidemia    Hypertension    Sleep apnea    uses CPAP nightly    Past Surgical History:  Procedure Laterality Date   BRONCHIAL  NEEDLE ASPIRATION BIOPSY  05/29/2021   Procedure: BRONCHIAL NEEDLE ASPIRATION BIOPSIES;  Surgeon: Garner Nash, DO;  Location: [facility];  Service: Pulmonary;;   COLONOSCOPY     finger surgery Right    Index   KNEE SURGERY Left    KNEE SURGERY Right    ORIF PATELLA Right    REVERSE SHOULDER ARTHROPLASTY Right 08/31/2020   Procedure: REVERSE SHOULDER ARTHROPLASTY;  Surgeon: Hiram Gash, MD;  Location: [facility];  Service: Orthopedics;  Laterality: Right;   SHOULDER ARTHROSCOPY WITH ROTATOR CUFF REPAIR Left 02/04/2020   Procedure: SHOULDER ARTHROSCOPY WITH ROTATOR CUFF REPAIR WITH BICEP TENODESIS;  Surgeon: Hiram Gash, MD;  Location: [facility];  Service: Orthopedics;  Laterality: Left;   VIDEO BRONCHOSCOPY WITH ENDOBRONCHIAL ULTRASOUND Left 05/29/2021   Procedure: VIDEO BRONCHOSCOPY WITH ENDOBRONCHIAL ULTRASOUND;  Surgeon: Garner Nash, DO;  Location: [facility];  Service: Pulmonary;  Laterality: Left;    Family History  Problem Relation Age of Onset   Cancer Mother        breast cancer    Hypertension Mother    Diabetes Mother    Venous thrombosis Son    Colon cancer Neg Hx    Esophageal cancer Neg Hx    Stomach cancer Neg Hx    Rectal cancer Neg Hx      Social History   Tobacco Use  Smoking Status Former   Packs/day: 1.50   Years: 25.00   Pack years: 37.50   Types: Cigarettes   Quit date: 04/2020   Years since quitting: 1.2  Smokeless Tobacco Never    Social History   Substance and Sexual Activity  Alcohol Use Yes   Comment: occasionally     No Known Allergies  Current Outpatient Medications  Medication Sig Dispense Refill   amLODipine (NORVASC) 10 MG tablet Take 1 tablet (10 mg total) by mouth at bedtime. 90 tablet 1   atorvastatin (LIPITOR) 10 MG tablet Take 10 mg by mouth daily.      Cinnamon 500 MG capsule Take 1,000 mg by mouth daily.     dapagliflozin propanediol (FARXIGA) 10 MG TABS tablet Take 1 tablet (10  mg total) by mouth daily before breakfast. 30 tablet 6   glucose blood test strip Use as instructed to test blood sugar 2 times daily E11.65    Onetouch strips 100 each 12   indapamide (LOZOL) 1.25 MG tablet TAKE ONE TABLET BY MOUTH DAILY 30 tablet 0   insulin degludec (TRESIBA FLEXTOUCH) 100 UNIT/ML FlexTouch Pen Inject 35 Units into the skin daily. 30 mL 3   losartan (COZAAR) 100 MG tablet Take 1 tablet (100 mg total) by mouth daily. 90 tablet 1   metFORMIN (GLUCOPHAGE) 1000 MG tablet TAKE ONE TABLET BY MOUTH EVERY MORNING AND AT BEDTIME 180 tablet 3   Multiple Vitamin (MULTIVITAMIN WITH MINERALS) TABS tablet Take 1 tablet by mouth daily.     Nebivolol HCl 20 MG TABS TAKE ONE TABLET BY MOUTH DAILY 90 tablet 0   Semaglutide, 1 MG/DOSE, (OZEMPIC, 1 MG/DOSE,) 4 MG/3ML SOPN Inject 1 mg into the skin once a week. 9 mL 3   spironolactone (ALDACTONE) 50 MG tablet Take 1 tablet (50 mg total) by mouth daily. 90 tablet 0   tadalafil (CIALIS) 20 MG tablet Take 1 tablet (20 mg total) by mouth daily as needed for erectile dysfunction (ED). 10 tablet 1   Testosterone 20.25 MG/ACT (1.62%) GEL Place 2 Act onto the skin daily. 225 g 1   No current facility-administered medications for this visit.    Review of Systems  Constitutional: Negative.   Respiratory: Negative.    Cardiovascular: Negative.   Neurological: Negative.   Endo/Heme/Allergies: Negative.     PHYSICAL EXAMINATION: BP (!) 155/95   Pulse 85   Resp 20   Ht 6\' 2"  (1.88 m)   Wt 270 lb (122.5 kg)   SpO2 94% Comment: RA  BMI 34.67 kg/m  Physical Exam Constitutional:      General: He is not in acute distress.    Appearance: Normal appearance. He is obese. He is not ill-appearing.  HENT:     Head: Normocephalic and atraumatic.  Eyes:     Extraocular Movements: Extraocular movements intact.  Cardiovascular:     Rate and Rhythm: Normal rate.  Pulmonary:     Effort: Pulmonary effort is normal. No respiratory  distress.  Abdominal:      General: There is no distension.  Musculoskeletal:        General: Normal range of motion.     Cervical back: Normal range of motion and neck supple.  Skin:    General: Skin is warm and dry.  Neurological:     General: No focal deficit present.     Mental Status: He is alert and oriented to person, place, and time.    Diagnostic Studies & Laboratory data:     Recent Radiology Findings:  PET CT: 02/06/21 IMPRESSION: 1. Mild to moderate degree of abnormal adenopathy in the neck, chest, and upper abdomen, Deauville 2. 2. The dominant left paratracheal mass also has Deauville 2 activity. Although difficult to separate from the left thyroid lobe I concur that on prior chest CT there is potentially a small fat plane separating this lesion from the thyroid, and given the presence of the other Deauville 2 adenopathy, this lesion seems more likely to be due to lymphoproliferative process rather than a thyroid lesion. Tissue sampling would be further definitive. 3. Other imaging findings of potential clinical significance: Mild cardiomegaly. Aortic Atherosclerosis (ICD10-I70.0). Suspected left subcoracoid bursitis. Diffuse hepatic steatosis.  05/01/21.  Thyroid scan IMPRESSION: No iodine isotope accumulation within the upper mediastinal mass which indicates that the mass is NOT differentiated thyroid tissue origin.  FNA 8/22 FINAL MICROSCOPIC DIAGNOSIS:  A. LUNG, PARATRACHEAL, FINE NEEDLE ASPIRATION:  - No malignant cells identified      I have independently reviewed the above radiology studies  and reviewed the findings with the patient.   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  On PET CT, it has increased uptake with an SUV of 2.3.  It does not have any uptake on thyroid scan, however I am concern that it is still attached the the left thyroid lobe.  A surgical approach would be challenging since it is in the middle mediastinum and track down bellow the aortic arch.  Likely the best approach would be from a cervical incision with thoracic back-up.  This would need to be done with ENT.  I will plan to send this patient to [facility] for further evaluation.    I  spent 25 minutes with  the patient face to face in counseling and coordination of care.    Lajuana Matte 07/07/2021 9:56 AM

## 2021-07-07 ENCOUNTER — Other Ambulatory Visit: Payer: Self-pay

## 2021-07-07 ENCOUNTER — Institutional Professional Consult (permissible substitution): Payer: Commercial Managed Care - PPO | Admitting: Thoracic Surgery (Cardiothoracic Vascular Surgery)

## 2021-07-07 VITALS — BP 155/95 | HR 85 | Resp 20 | Ht 74.0 in | Wt 270.0 lb

## 2021-07-07 DIAGNOSIS — J9859 Other diseases of mediastinum, not elsewhere classified: Secondary | ICD-10-CM

## 2021-07-19 ENCOUNTER — Other Ambulatory Visit: Payer: Self-pay | Admitting: Internal Medicine

## 2021-07-19 DIAGNOSIS — N5201 Erectile dysfunction due to arterial insufficiency: Secondary | ICD-10-CM

## 2021-07-22 ENCOUNTER — Other Ambulatory Visit: Payer: Self-pay | Admitting: Internal Medicine

## 2021-07-22 DIAGNOSIS — I10 Essential (primary) hypertension: Secondary | ICD-10-CM

## 2021-08-02 ENCOUNTER — Other Ambulatory Visit: Payer: Self-pay | Admitting: Internal Medicine

## 2021-08-02 DIAGNOSIS — I10 Essential (primary) hypertension: Secondary | ICD-10-CM

## 2021-08-03 ENCOUNTER — Other Ambulatory Visit: Payer: Self-pay | Admitting: Internal Medicine

## 2021-08-03 DIAGNOSIS — I10 Essential (primary) hypertension: Secondary | ICD-10-CM

## 2021-08-07 ENCOUNTER — Other Ambulatory Visit: Payer: Self-pay | Admitting: Internal Medicine

## 2021-08-07 DIAGNOSIS — I10 Essential (primary) hypertension: Secondary | ICD-10-CM

## 2021-08-11 ENCOUNTER — Other Ambulatory Visit: Payer: Self-pay | Admitting: Internal Medicine

## 2021-08-11 DIAGNOSIS — I10 Essential (primary) hypertension: Secondary | ICD-10-CM

## 2021-08-15 ENCOUNTER — Ambulatory Visit: Payer: 59 | Admitting: Internal Medicine

## 2021-08-16 ENCOUNTER — Other Ambulatory Visit: Payer: Self-pay | Admitting: Internal Medicine

## 2021-08-16 DIAGNOSIS — N5201 Erectile dysfunction due to arterial insufficiency: Secondary | ICD-10-CM

## 2021-09-05 ENCOUNTER — Other Ambulatory Visit: Payer: Self-pay | Admitting: Internal Medicine

## 2021-09-05 DIAGNOSIS — I10 Essential (primary) hypertension: Secondary | ICD-10-CM

## 2021-09-22 ENCOUNTER — Encounter: Payer: Self-pay | Admitting: Internal Medicine

## 2021-09-22 ENCOUNTER — Other Ambulatory Visit: Payer: Self-pay

## 2021-09-22 ENCOUNTER — Ambulatory Visit: Payer: Commercial Managed Care - PPO | Admitting: Internal Medicine

## 2021-09-22 VITALS — BP 120/80 | HR 82 | Resp 18 | Wt 269.2 lb

## 2021-09-22 DIAGNOSIS — E119 Type 2 diabetes mellitus without complications: Secondary | ICD-10-CM

## 2021-09-22 DIAGNOSIS — Z794 Long term (current) use of insulin: Secondary | ICD-10-CM | POA: Diagnosis not present

## 2021-09-22 DIAGNOSIS — E785 Hyperlipidemia, unspecified: Secondary | ICD-10-CM | POA: Diagnosis not present

## 2021-09-22 LAB — POCT GLYCOSYLATED HEMOGLOBIN (HGB A1C): Hemoglobin A1C: 6.8 % — AB (ref 4.0–5.6)

## 2021-09-22 LAB — BASIC METABOLIC PANEL
BUN: 16 mg/dL (ref 6–23)
CO2: 25 mEq/L (ref 19–32)
Calcium: 9.6 mg/dL (ref 8.4–10.5)
Chloride: 106 mEq/L (ref 96–112)
Creatinine, Ser: 0.93 mg/dL (ref 0.40–1.50)
GFR: 90.99 mL/min (ref >60.00)
Glucose, Bld: 105 mg/dL — ABNORMAL HIGH (ref 70–99)
Potassium: 4.1 mEq/L (ref 3.5–5.1)
Sodium: 140 mEq/L (ref 135–145)

## 2021-09-22 LAB — MICROALBUMIN / CREATININE URINE RATIO
Creatinine,U: 110.7 mg/dL
Microalb Creat Ratio: 0.6 mg/g (ref 0.0–30.0)
Microalb, Ur: <0.7 mg/dL (ref 0.0–1.9)

## 2021-09-22 LAB — LIPID PANEL
Cholesterol: 112 mg/dL (ref 0–200)
HDL: 29.4 mg/dL — ABNORMAL LOW (ref >39.00)
LDL Cholesterol: 47 mg/dL (ref 0–99)
NonHDL: 82.24
Total CHOL/HDL Ratio: 4
Triglycerides: 176 mg/dL — ABNORMAL HIGH (ref 0.0–149.0)
VLDL: 35.2 mg/dL (ref 0.0–40.0)

## 2021-09-22 MED ORDER — ATORVASTATIN CALCIUM 10 MG PO TABS: 10.0000 mg | ORAL_TABLET | Freq: Every day | ORAL | 3 refills | Status: DC

## 2021-09-22 MED ORDER — DAPAGLIFLOZIN PROPANEDIOL 10 MG PO TABS: 10.0000 mg | ORAL_TABLET | Freq: Every day | ORAL | 3 refills | Status: DC

## 2021-09-22 MED ORDER — METFORMIN HCL 1000 MG PO TABS: 1000.0000 mg | ORAL_TABLET | Freq: Two times a day (BID) | ORAL | 3 refills | Status: DC

## 2021-09-22 MED ORDER — TRESIBA FLEXTOUCH 100 UNIT/ML ~~LOC~~ SOPN: 34.0000 [IU] | PEN_INJECTOR | Freq: Every day | SUBCUTANEOUS | 3 refills | Status: DC

## 2021-09-22 MED ORDER — OZEMPIC (1 MG/DOSE) 4 MG/3ML ~~LOC~~ SOPN: 1.0000 mg | PEN_INJECTOR | SUBCUTANEOUS | 3 refills | Status: DC

## 2021-09-22 NOTE — Patient Instructions (Addendum)
-   Continue Ozempic 1 mg weekly  - Continue 34 units daily  - Continue Metformin 1000 mg, 1 tablet twice daily  - Continue Farxiga 10 mg , 1 tablet with Breakfast     HOW TO TREAT LOW BLOOD SUGARS (Blood sugar LESS THAN 70 MG/DL) Please follow the RULE OF 15 for the treatment of hypoglycemia treatment (when your (blood sugars are less than 70 mg/dL)   STEP 1: Take 15 grams of carbohydrates when your blood sugar is low, which includes:  3-4 GLUCOSE TABS  OR 3-4 OZ OF JUICE OR REGULAR SODA OR ONE TUBE OF GLUCOSE GEL    STEP 2: RECHECK blood sugar in 15 MINUTES STEP 3: If your blood sugar is still low at the 15 minute recheck --> then, go back to STEP 1 and treat AGAIN with another 15 grams of carbohydrates.

## 2021-09-22 NOTE — Progress Notes (Signed)
Name: James Parrish  Age/ Sex: 55 y.o., male   MRN/ DOB: [id number], [birth year]     PCP: Janith Lima, MD   Reason for Endocrinology Evaluation: Type 2 Diabetes Mellitus  Initial Endocrine Consultative Visit: 07/13/2019    PATIENT IDENTIFIER: [name] is a 55 y.o. male with a past medical history of T2Dm, HTN and Dyslipidemia. The patient has followed with Endocrinology clinic since 07/13/2019 for consultative assistance with management of his diabetes.  DIABETIC HISTORY:  [name] was diagnosed with DM prior to 2010, was initially on oral glycemic agents, insulin started in 2013. His hemoglobin A1c has ranged from 7.2% in 2018, peaking at 8.8% in 2019.  On his initial visit to our clinic, his A1c 7.7% . He was on lantus, victoza, metformin and Jardiance. We switched Victoza to Ozempic.    He is a truck driver SUBJECTIVE:   During the last visit (04/11/2021): A1c 6.8% . We continued Ozempic, Lantus, Jardiance,and  Metformin  Today (09/22/2021): James Parrish is here for a  follow up on diabetes. He is accompanied by his wife [name].  He checks his blood sugars 1 times . The patient has not had hypoglycemic episodes since the last clinic visit.   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  He is currently being followed by pulmonology, they are evaluating mediastinal mass, PET imaging has been done April/2022, thyroid scan was no non revealing as well as FNA of the mass.   He is schedule for thyroidectomy including substernal thyroid  10/19/2021     HOME DIABETES REGIMEN:  Ozempic 1.0 mg weekly ( Fridays )  Farxiga 5 mg daily  Metformin 1000 mg Twice daily  Tresiba 34 units daily      DIABETIC COMPLICATIONS: Microvascular complications:    Denies: CKD, retinopathy, neuropathy  Last eye exam: Completed 11/2020   Macrovascular complications:     Denies: CAD, PVD, CVA  METER DOWNLOAD SUMMARY:Did not bring     HISTORY:  Past Medical History:  Past Medical History:  Diagnosis Date   Complication of anesthesia    hard to wake up    Diabetes mellitus without complication ([place])    ED (erectile dysfunction)    History of COVID-19 04/2019   Hyperlipidemia    Hypertension    Sleep apnea    uses CPAP nightly   Past Surgical History:  Past Surgical History:  Procedure Laterality Date   BRONCHIAL NEEDLE ASPIRATION BIOPSY  05/29/2021   Procedure: BRONCHIAL NEEDLE ASPIRATION BIOPSIES;  Surgeon: Garner Nash, DO;  Location: [facility];  Service: Pulmonary;;   COLONOSCOPY     finger surgery Right    Index   KNEE SURGERY Left    KNEE SURGERY Right    ORIF PATELLA Right    REVERSE SHOULDER ARTHROPLASTY Right 08/31/2020   Procedure: REVERSE SHOULDER ARTHROPLASTY;  Surgeon: Hiram Gash, MD;  Location: [facility];  Service: Orthopedics;  Laterality: Right;   SHOULDER ARTHROSCOPY WITH ROTATOR CUFF REPAIR Left 02/04/2020   Procedure: SHOULDER ARTHROSCOPY WITH ROTATOR CUFF REPAIR WITH BICEP TENODESIS;  Surgeon: Hiram Gash, MD;  Location: [facility];  Service: Orthopedics;  Laterality: Left;   VIDEO BRONCHOSCOPY WITH ENDOBRONCHIAL ULTRASOUND Left 05/29/2021   Procedure: VIDEO BRONCHOSCOPY WITH ENDOBRONCHIAL ULTRASOUND;  Surgeon: Garner Nash, DO;  Location:  [facility];  Service: Pulmonary;  Laterality: Left;   Social History:  reports that he quit smoking about 17 months ago. His smoking use included cigarettes. He has a 37.50 pack-year smoking history. He has never used smokeless tobacco. He reports current alcohol use. He reports that he does not use drugs. Family History:  Family History  Problem Relation Age of Onset   Cancer Mother        breast cancer    Hypertension Mother    Diabetes Mother    Venous thrombosis Son    Colon cancer Neg Hx    Esophageal cancer Neg Hx    Stomach  cancer Neg Hx    Rectal cancer Neg Hx      HOME MEDICATIONS: Allergies as of 09/22/2021   No Known Allergies      Medication List        Accurate as of September 22, 2021  3:11 PM. If you have any questions, ask your nurse or doctor.          amLODipine 10 MG tablet Commonly known as: NORVASC Take 1 tablet (10 mg total) by mouth at bedtime.   atorvastatin 10 MG tablet Commonly known as: LIPITOR Take 10 mg by mouth daily.   Cinnamon 500 MG capsule Take 1,000 mg by mouth daily.   dapagliflozin propanediol 10 MG Tabs tablet Commonly known as: Farxiga Take 1 tablet (10 mg total) by mouth daily before breakfast.   glucose blood test strip Use as instructed to test blood sugar 2 times daily E11.65    Onetouch strips   indapamide 1.25 MG tablet Commonly known as: LOZOL TAKE ONE TABLET BY MOUTH DAILY   losartan 100 MG tablet Commonly known as: COZAAR Take 1 tablet (100 mg total) by mouth daily.   metFORMIN 1000 MG tablet Commonly known as: GLUCOPHAGE TAKE ONE TABLET BY MOUTH EVERY MORNING AND AT BEDTIME   multivitamin with minerals Tabs tablet Take 1 tablet by mouth daily.   Nebivolol HCl 20 MG Tabs TAKE ONE TABLET BY MOUTH DAILY   Ozempic (1 MG/DOSE) 4 MG/3ML Sopn Generic drug: Semaglutide (1 MG/DOSE) Inject 1 mg into the skin once a week.   spironolactone 50 MG tablet Commonly known as: ALDACTONE Take 1 tablet (50 mg total) by mouth daily.   tadalafil 20 MG tablet Commonly known as: CIALIS TAKE ONE TABLET BY MOUTH DAILY AS NEEDED FOR ERECTILE DYSFUNCTION   Testosterone 20.25 MG/ACT (1.62%) Gel Place 2 Act onto the skin daily.   Tyler Aas FlexTouch 100 UNIT/ML FlexTouch Pen Generic drug: insulin degludec Inject 35 Units into the skin daily.         OBJECTIVE:   Vital Signs: BP 120/80   Pulse 82   Resp 18   Wt 269 lb 3.2 oz (122.1 kg)   SpO2 96%   BMI 34.56 kg/m   Wt Readings from Last 3 Encounters:  09/22/21 269 lb 3.2 oz (122.1 kg)   07/07/21 270 lb (122.5 kg)  05/29/21 270 lb (122.5 kg)     Exam: General: Pt appears well and is in NAD  Lungs: Clear with good BS bilat with no rales, rhonchi, or wheezes  Heart: RRR with normal S1 and S2 and no gallops; no murmurs; no rub  Abdomen: Normoactive bowel sounds, soft, nontender, without masses or organomegaly palpable  Extremities: No pretibial edema.   Neuro: MS is good with appropriate affect, pt is alert and Ox3    DM foot exam: 12/01/2020 The skin of the  feet is intact without sores or ulcerations. The pedal pulses are 2+ on right and 2+ on left. The sensation is intact to a screening 5.07, 10 gram monofilament bilaterally     DATA REVIEWED:  Lab Results  Component Value Date   HGBA1C 6.8 (A) 09/22/2021   HGBA1C 6.8 (A) 04/11/2021   HGBA1C 7.0 (A) 12/01/2020   Lab Results  Component Value Date   MICROALBUR 5.0 05/11/2020   LDLCALC 41 05/11/2020   CREATININE 0.89 08/29/2020    05/11/2020  Microalb/cr ratio 8.9  LDL 41mg /dL  BUN/Cr 12/0.890  Results for James, Parrish (MRN [id number]) as of 04/11/2021 15:38  Ref. Range 02/22/2021 16:19  TSH Latest Ref Range: 0.35 - 4.50 uIU/mL 1.25  Triiodothyronine,Free,Serum Latest Ref Range: 2.3 - 4.2 pg/mL 3.5  Triiodothyronine (T3) Latest Ref Range: 76 - 181 ng/dL 125  T4,Free(Direct) Latest Ref Range: 0.60 - 1.60 ng/dL 0.98  Thyroxine (T4) Latest Ref Range: 4.9 - 10.5 mcg/dL 8.6  Free Thyroxine Index Latest Ref Range: 1.4 - 3.8  2.8  T3 Uptake Latest Ref Range: 22 - 35 % 32    FINAL MICROSCOPIC DIAGNOSIS: 05/29/2021 A. LUNG, PARATRACHEAL, FINE NEEDLE ASPIRATION:  - No malignant cells identified    Thyroid Uptake and Scan 05/01/2021  There is no significant radiotracer accumulation associated with the mediastinal mass in deep anterior mediastinum LEFT adjacent to the trachea. There is intense radiotracer accumulation within the thyroid gland.   IMPRESSION: No iodine isotope accumulation within the  upper mediastinal mass which indicates that the mass is NOT differentiated thyroid tissue origin.     Thyroid ultrasound 04/21/2021 Estimated total number of nodules >/= 1 cm: 1   Number of spongiform nodules >/=  2 cm not described below (TR1): 0   Number of mixed cystic and solid nodules >/= 1.5 cm not described below ([place]): 0   _________________________________________________________   No discrete right thyroid nodules.   Area of concern in the left inferior thyroid lobe is difficult to evaluate due to the substernal location. However, there is a slightly heterogeneous isoechoic nodule extending from the inferior left thyroid lobe. This is likely contiguous with the thyroid lobe and represents a large thyroid nodule. This nodular area measures roughly 6.0 x 4.0 x 4.0 cm. This is most compatible with a TR 3 nodule. **Given size (>/= 2.5 cm) and appearance, fine needle aspiration of this mildly suspicious nodule should be considered based on TI-RADS criteria.   Enlarged cervical lymph nodes on both sides of the neck. Index lymph node in the right submandibular region measures 1.9 cm in the short axis. Index lymph node on left side of the neck measures 1.1 cm in the short axis.   IMPRESSION: 1. Heterogeneous nodule along the inferior aspect of the left thyroid lobe. Although this area is very difficult to image, the echogenicity is similar to the adjacent thyroid tissue and favor a large left thyroid nodule. The entire nodule is not imaged on this examination. Based on the size and echogenicity, this is compatible with a TR 3 nodule and would meet criteria for biopsy. 2. Several enlarged lymph nodes on both sides of the neck. These findings are similar to the prior PET-CT imaging.      CT Scan Chest 01/06/2021   Cardiovascular: Heart size is normal. There is no significant pericardial fluid, thickening or pericardial calcification. No atherosclerotic calcifications in  the thoracic aorta or the coronary arteries.   Mediastinum/Nodes: Large soft tissue mass extending into the middle  mediastinum along the left paratracheal region measuring 5.1 x 3.6 x 8.5 cm. This is intimately associated with the left lobe of the thyroid gland, however, on the coronal reconstructions, there does appear to be a potential intervening fat plane. Several other prominent but nonenlarged mediastinal lymph nodes are noted, largest of which measures up to 1.2 cm in the prevascular nodal station. Esophagus is unremarkable in appearance. No axillary lymphadenopathy. Esophagus is unremarkable in appearance. Multiple borderline enlarged bilateral axillary lymph nodes with enlarged right subpectoral lymph node measuring 2 cm (axial image 12 of series 2).   Lungs/Pleura: Tiny pulmonary nodule in the medial aspect of the right lower lobe (axial image 232 of series 3), with a volume derived mean diameter of 2.0 mm. No other larger more suspicious appearing pulmonary nodules or masses are noted. No acute consolidative airspace disease. No pleural effusions. Diffuse bronchial wall thickening with very mild centrilobular and paraseptal emphysema   Upper Abdomen: Mild diffuse low attenuation throughout the hepatic parenchyma, indicative of hepatic steatosis.   Musculoskeletal: Status post right shoulder arthroplasty. There are no aggressive appearing lytic or blastic lesions noted in the visualized portions of the skeleton.   IMPRESSION: 1. Lung-RADS 2S, benign appearance or behavior. Continue annual screening with low-dose chest CT without contrast in 12 months. 2. The "S" modifier above refers to potentially clinically significant non lung cancer related findings. Specifically, lymphadenopathy noted in the mediastinal, axillary and subpectoral nodal stations, as above. Most significantly, there is what appears to be a large middle mediastinal mass measuring 5.1 x 3.6 x 8.5  cm in the left paratracheal nodal station. Underlying lymphoproliferative disease is suspected. Further evaluation with PET-CT should be considered. 3. Mild diffuse bronchial wall thickening with very mild centrilobular and paraseptal emphysema; imaging findings suggestive of underlying COPD. 4. Hepatic steatosis.   Emphysema (ICD10-J43.9).   PET scan 02/06/2021 Mild to moderate degree of abnormal adenopathy in the neck, chest, and upper abdomen, Deauville 2. 2. The dominant left paratracheal mass also has Deauville 2 activity. Although difficult to separate from the left thyroid lobe I concur that on prior chest CT there is potentially a small fat plane separating this lesion from the thyroid, and given the presence of the other Deauville 2 adenopathy, this lesion seems more likely to be due to lymphoproliferative process rather than a thyroid lesion. Tissue sampling would be further definitive. 3. Other imaging findings of potential clinical significance: Mild cardiomegaly. Aortic Atherosclerosis (ICD10-I70.0). Suspected left subcoracoid bursitis. Diffuse hepatic steatosis.    ASSESSMENT / PLAN / RECOMMENDATIONS:   1) Type 2 Diabetes Mellitus, Optimally controlled, Without  complications - Most recent A1c of 6.8 %. Goal A1c < 7.0 %.     - A1c continues to be optimal - I have praised the patient on glycemic control, no changes today - We again emphasized the importance of low carb diet    MEDICATIONS: -Continue Ozempic 1.0 mg weekly  -Continue Farxiga 10 mg daily  mg daily  -Continue Metformin 1000 mg Twice daily  -Continue Basaglar 34 units daily    EDUCATION / INSTRUCTIONS: BG monitoring instructions: Patient is instructed to check his blood sugars 1 time a day. Call [facility] if: BG persistently < 70  I reviewed the Rule of 15 for the treatment of hypoglycemia in detail with the patient. Literature supplied.    2) Left mediastinal mass  -This  was an incidental finding on lung cancer screen in 12/2020, there is a question that this is an  extension from the left thyroid lobe ,PET CT has been done which was unrevealing, thyroid uptake and scan showed that the medicinals mass was not a differentiated thyroid tissue.  -He is scheduled for left lobectomy and resection of mediastinal mass on 10/19/2021 -He understands that it is unclear at this time if LT-4 replacement will be needed, he will need a recheck of TFTs ~ 6 weeks after surgery   3)Dyslipidemia:  -TG is trending down -LDL at goal  Continue atorvastatin 10 mg daily  F/U in 3 months    Signed electronically by: Mack Guise, MD  [facility]  [facility] [address] [place],  [address] Phone: [phone] FAX: [phone]   CC: Janith Lima, MD [place] [address] Phone: [phone]  Fax: [phone]  Return to Endocrinology clinic as below: Future Appointments  Date Time Provider [place]  12/21/2021 10:30 AM Courtlynn Holloman, Melanie Crazier, MD [facility] None

## 2021-09-25 ENCOUNTER — Telehealth: Payer: Self-pay

## 2021-09-25 ENCOUNTER — Other Ambulatory Visit (HOSPITAL_COMMUNITY): Payer: Self-pay

## 2021-09-25 NOTE — Telephone Encounter (Signed)
Patient Advocate Encounter   Received notification from [facility] that prior authorization for Tresiba flextouch pen injector is required by his/her insurance [employer].  PA form was sent to the provider for signature w/attached chart notes.  Status is pending    [facility] will continue to follow:  Patient Advocate Fax:  [phone]

## 2021-09-26 ENCOUNTER — Other Ambulatory Visit (HOSPITAL_COMMUNITY): Payer: Self-pay

## 2021-09-27 ENCOUNTER — Telehealth: Payer: Self-pay

## 2021-09-27 NOTE — Telephone Encounter (Signed)
Authorization request formed has been faxed for the [place].

## 2021-09-28 ENCOUNTER — Other Ambulatory Visit: Payer: Self-pay | Admitting: Internal Medicine

## 2021-09-28 DIAGNOSIS — I10 Essential (primary) hypertension: Secondary | ICD-10-CM

## 2021-10-02 ENCOUNTER — Other Ambulatory Visit: Payer: Self-pay | Admitting: Internal Medicine

## 2021-10-02 MED ORDER — TOUJEO MAX SOLOSTAR 300 UNIT/ML ~~LOC~~ SOPN: 34.0000 [IU] | PEN_INJECTOR | Freq: Every day | SUBCUTANEOUS | 3 refills | Status: DC

## 2021-10-02 NOTE — Telephone Encounter (Signed)
Patient Advocate Encounter  Received notification from [employer] benefits that the request for prior authorization for James Parrish has been denied due to the preferred alt being [employer].     This encounter will continue to be updated until final determination.     Specialty Pharmacy Patient Advocate Fax:  [phone]

## 2021-10-02 NOTE — Telephone Encounter (Signed)
Mychart message sent to patient about change in medication.

## 2021-10-05 ENCOUNTER — Other Ambulatory Visit: Payer: Self-pay | Admitting: Internal Medicine

## 2021-10-05 DIAGNOSIS — I10 Essential (primary) hypertension: Secondary | ICD-10-CM

## 2021-10-12 ENCOUNTER — Telehealth: Payer: Self-pay | Admitting: Internal Medicine

## 2021-10-12 DIAGNOSIS — I10 Essential (primary) hypertension: Secondary | ICD-10-CM

## 2021-10-13 NOTE — Telephone Encounter (Signed)
Patient calling in  Scheduled med refill appt 02.02.23.[name] Kitchen wants to know if provider can do short supply refill until visit

## 2021-10-17 ENCOUNTER — Other Ambulatory Visit: Payer: Self-pay | Admitting: Internal Medicine

## 2021-10-17 DIAGNOSIS — I10 Essential (primary) hypertension: Secondary | ICD-10-CM

## 2021-10-17 MED ORDER — INDAPAMIDE 1.25 MG PO TABS: 1.2500 mg | ORAL_TABLET | Freq: Every day | ORAL | 0 refills | Status: DC

## 2021-10-17 MED ORDER — NEBIVOLOL HCL 20 MG PO TABS: 1.0000 | ORAL_TABLET | Freq: Every day | ORAL | 0 refills | Status: DC

## 2021-10-24 ENCOUNTER — Encounter (HOSPITAL_COMMUNITY): Payer: Self-pay | Admitting: Emergency Medicine

## 2021-10-24 ENCOUNTER — Ambulatory Visit (HOSPITAL_COMMUNITY)
Admission: EM | Admit: 2021-10-24 | Discharge: 2021-10-24 | Disposition: A | Discharge status: Home / Self Care | Payer: Commercial Managed Care - PPO | Attending: Family Medicine | Admitting: Family Medicine

## 2021-10-24 ENCOUNTER — Other Ambulatory Visit: Payer: Self-pay

## 2021-10-24 DIAGNOSIS — M79641 Pain in right hand: Secondary | ICD-10-CM

## 2021-10-24 MED ORDER — PREDNISONE 20 MG PO TABS: 40.0000 mg | ORAL_TABLET | Freq: Every day | ORAL | 0 refills | Status: DC

## 2021-10-24 NOTE — Discharge Instructions (Signed)
Monitor your blood sugars daily. They will increase while you are taking prednisone.

## 2021-10-24 NOTE — ED Triage Notes (Signed)
2 weeks ago noticed pain in right middle finger, no known injury.  Pain in right middle finger and in palm at base of middle finger is very painful with attempts to make a fist.  Finger looks swollen.  Patient is right handed .  Patient is a Administrator .

## 2021-10-25 NOTE — ED Provider Notes (Signed)
[facility]   [id number] 10/24/21 Arrival Time: 8527  ASSESSMENT & PLAN:  1. Hand pain, right   No trauma. No indication for imaging at this time.  Questions tendon sheath inflammation. Discussed. Activities as tolerated. Begin trial of: Discharge Medication List as of 10/24/2021  3:15 PM     START taking these medications   Details  predniSONE (DELTASONE) 20 MG tablet Take 2 tablets (40 mg total) by mouth daily., Starting Tue 10/24/2021, Normal         Discharge Instructions      Monitor your blood sugars daily. They will increase while you are taking prednisone.      Recommend:  Follow-up Information     Janith Lima, MD.   Specialty: Internal Medicine Why: As needed. Contact information: [place] [address] [phone]                  Reviewed expectations re: course of current medical issues. Questions answered. Outlined signs and symptoms indicating need for more acute intervention. Patient verbalized understanding. After Visit Summary given.  SUBJECTIVE: History from: patient. James Parrish is a 56 y.o. male who reports R hand pain; specifically mid distal palmar. No injury. Drives a truck; questions relation to gripping steering wheel. No swelling/erythema. No extremity sensation changes or weakness. Very sore when trying to flex fingers. Afebrile. No tx PTA.   Past Surgical History:  Procedure Laterality Date   BRONCHIAL NEEDLE ASPIRATION BIOPSY  05/29/2021   Procedure: BRONCHIAL NEEDLE ASPIRATION BIOPSIES;  Surgeon: Garner Nash, DO;  Location: [facility];  Service: Pulmonary;;   COLONOSCOPY     finger surgery Right    Index   KNEE SURGERY Left    KNEE SURGERY Right    ORIF PATELLA Right    REVERSE SHOULDER ARTHROPLASTY Right 08/31/2020   Procedure: REVERSE SHOULDER ARTHROPLASTY;  Surgeon: Hiram Gash, MD;  Location: [facility];  Service: Orthopedics;  Laterality: Right;    SHOULDER ARTHROSCOPY WITH ROTATOR CUFF REPAIR Left 02/04/2020   Procedure: SHOULDER ARTHROSCOPY WITH ROTATOR CUFF REPAIR WITH BICEP TENODESIS;  Surgeon: Hiram Gash, MD;  Location: [facility];  Service: Orthopedics;  Laterality: Left;   VIDEO BRONCHOSCOPY WITH ENDOBRONCHIAL ULTRASOUND Left 05/29/2021   Procedure: VIDEO BRONCHOSCOPY WITH ENDOBRONCHIAL ULTRASOUND;  Surgeon: Garner Nash, DO;  Location: [facility];  Service: Pulmonary;  Laterality: Left;      OBJECTIVE:  Vitals:   10/24/21 1457  BP: (!) 158/100  Pulse: 83  Resp: 20  Temp: 98.4 F (36.9 C)  TempSrc: Oral  SpO2: 94%    General appearance: alert; no distress HEENT: [place]; AT Neck: supple with FROM Resp: unlabored respirations Extremities: RUE: warm with well perfused appearance; fairly well localized moderate tenderness over right distal palm nearing 3rd MCP; without gross deformities; swelling: none; bruising: none; all fingers with FROM CV: brisk extremity capillary refill of RUE; 2+ radial pulse of RUE. Skin: warm and dry; no visible rashes Neurologic: gait normal; normal sensation and strength of RUE Psychological: alert and cooperative; normal mood and affect  Imaging: No results found.    No Known Allergies  Past Medical History:  Diagnosis Date   Complication of anesthesia    hard to wake up    Diabetes mellitus without complication [facility])    ED (erectile dysfunction)    History of COVID-19 04/2019   Hyperlipidemia    Hypertension    Sleep apnea    uses CPAP  nightly   Social History   Socioeconomic History   Marital status: Married    Spouse name: Not on file   Number of children: Not on file   Years of education: Not on file   Highest education level: Not on file  Occupational History   Not on file  Tobacco Use   Smoking status: Former    Packs/day: 1.50    Years: 25.00    Pack years: 37.50    Types: Cigarettes    Quit date: 04/2020    Years since quitting: 1.5    Smokeless tobacco: Never  Vaping Use   Vaping Use: Never used  Substance and Sexual Activity   Alcohol use: Yes    Comment: occasionally   Drug use: Never   Sexual activity: Yes  Other Topics Concern   Not on file  Social History Narrative   Not on file   Social Determinants of Health   Financial Resource Strain: Not on file  Food Insecurity: Not on file  Transportation Needs: Not on file  Physical Activity: Not on file  Stress: Not on file  Social Connections: Not on file   Family History  Problem Relation Age of Onset   Cancer Mother        breast cancer    Hypertension Mother    Diabetes Mother    Venous thrombosis Son    Colon cancer Neg Hx    Esophageal cancer Neg Hx    Stomach cancer Neg Hx    Rectal cancer Neg Hx    Past Surgical History:  Procedure Laterality Date   BRONCHIAL NEEDLE ASPIRATION BIOPSY  05/29/2021   Procedure: BRONCHIAL NEEDLE ASPIRATION BIOPSIES;  Surgeon: Garner Nash, DO;  Location: [facility];  Service: Pulmonary;;   COLONOSCOPY     finger surgery Right    Index   KNEE SURGERY Left    KNEE SURGERY Right    ORIF PATELLA Right    REVERSE SHOULDER ARTHROPLASTY Right 08/31/2020   Procedure: REVERSE SHOULDER ARTHROPLASTY;  Surgeon: Hiram Gash, MD;  Location: [facility];  Service: Orthopedics;  Laterality: Right;   SHOULDER ARTHROSCOPY WITH ROTATOR CUFF REPAIR Left 02/04/2020   Procedure: SHOULDER ARTHROSCOPY WITH ROTATOR CUFF REPAIR WITH BICEP TENODESIS;  Surgeon: Hiram Gash, MD;  Location: [facility];  Service: Orthopedics;  Laterality: Left;   VIDEO BRONCHOSCOPY WITH ENDOBRONCHIAL ULTRASOUND Left 05/29/2021   Procedure: VIDEO BRONCHOSCOPY WITH ENDOBRONCHIAL ULTRASOUND;  Surgeon: Garner Nash, DO;  Location: [facility];  Service: Pulmonary;  Laterality: Left;       Vanessa Kick, MD 10/25/21 [phone]

## 2021-11-13 ENCOUNTER — Other Ambulatory Visit: Payer: Self-pay | Admitting: Internal Medicine

## 2021-11-13 DIAGNOSIS — I10 Essential (primary) hypertension: Secondary | ICD-10-CM

## 2021-11-16 ENCOUNTER — Other Ambulatory Visit: Payer: Commercial Managed Care - PPO

## 2021-11-16 ENCOUNTER — Other Ambulatory Visit: Payer: Self-pay

## 2021-11-16 ENCOUNTER — Ambulatory Visit: Payer: Commercial Managed Care - PPO | Admitting: Internal Medicine

## 2021-11-16 ENCOUNTER — Ambulatory Visit (INDEPENDENT_AMBULATORY_CARE_PROVIDER_SITE_OTHER): Payer: Commercial Managed Care - PPO

## 2021-11-16 ENCOUNTER — Encounter: Payer: Self-pay | Admitting: Internal Medicine

## 2021-11-16 VITALS — BP 132/86 | HR 78 | Temp 97.9°F | Resp 16 | Ht 74.0 in | Wt 270.4 lb

## 2021-11-16 DIAGNOSIS — I10 Essential (primary) hypertension: Secondary | ICD-10-CM

## 2021-11-16 DIAGNOSIS — R052 Subacute cough: Secondary | ICD-10-CM | POA: Insufficient documentation

## 2021-11-16 DIAGNOSIS — E119 Type 2 diabetes mellitus without complications: Secondary | ICD-10-CM | POA: Diagnosis not present

## 2021-11-16 DIAGNOSIS — J22 Unspecified acute lower respiratory infection: Secondary | ICD-10-CM

## 2021-11-16 DIAGNOSIS — D7289 Other specified disorders of white blood cells: Secondary | ICD-10-CM

## 2021-11-16 DIAGNOSIS — Z794 Long term (current) use of insulin: Secondary | ICD-10-CM

## 2021-11-16 LAB — CBC WITH DIFFERENTIAL/PLATELET
Basophils Absolute: 0 10*3/uL (ref 0.0–0.1)
Basophils Relative: 0.2 % (ref 0.0–3.0)
Eosinophils Absolute: 0.1 10*3/uL (ref 0.0–0.7)
Eosinophils Relative: 0.4 % (ref 0.0–5.0)
HCT: 46.1 % (ref 39.0–52.0)
Hemoglobin: 14.8 g/dL (ref 13.0–17.0)
Lymphocytes Relative: 73.2 % — ABNORMAL HIGH (ref 12.0–46.0)
Lymphs Abs: 11.6 10*3/uL — ABNORMAL HIGH (ref 0.7–4.0)
MCHC: 32.1 g/dL (ref 30.0–36.0)
MCV: 76.4 fl — ABNORMAL LOW (ref 78.0–100.0)
Monocytes Absolute: 0.6 10*3/uL (ref 0.1–1.0)
Monocytes Relative: 3.9 % (ref 3.0–12.0)
Neutro Abs: 3.5 10*3/uL (ref 1.4–7.7)
Neutrophils Relative %: 22.3 % — ABNORMAL LOW (ref 43.0–77.0)
Platelets: 180 10*3/uL (ref 150.0–400.0)
RBC: 6.03 Mil/uL — ABNORMAL HIGH (ref 4.22–5.81)
RDW: 15 % (ref 11.5–15.5)
WBC: 15.9 10*3/uL — ABNORMAL HIGH (ref 4.0–10.5)

## 2021-11-16 MED ORDER — LOSARTAN POTASSIUM 100 MG PO TABS: 100.0000 mg | ORAL_TABLET | Freq: Every day | ORAL | 0 refills | Status: DC

## 2021-11-16 NOTE — Progress Notes (Signed)
Atypical lymph  Subjective:  Patient ID: James Parrish, male    DOB: 1966/06/21  Age: 56 y.o. MRN: [id number]  CC: Hypertension and Cough  This visit occurred during the SARS-CoV-2 public health emergency.  Safety protocols were in place, including screening questions prior to the visit, additional usage of staff PPE, and extensive cleaning of exam room while observing appropriate contact time as indicated for disinfecting solutions.    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  He continues to complain of trouble swallowing but says the symptoms are getting better.  He denies neck pain or swelling.  He complains of a 2-week history of cough productive of yellow phlegm with nasal congestion and runny nose.  Outpatient Medications Prior to Visit  Medication Sig Dispense Refill   amLODipine (NORVASC) 10 MG tablet Take 1 tablet (10 mg total) by mouth at bedtime. 90 tablet 1   atorvastatin (LIPITOR) 10 MG tablet Take 1 tablet (10 mg total) by mouth daily. 90 tablet 3   dapagliflozin propanediol (FARXIGA) 10 MG TABS tablet Take 1 tablet (10 mg total) by mouth daily before breakfast. 90 tablet 3   glucose blood test strip Use as instructed to test blood sugar 2 times daily E11.65    Onetouch strips 100 each 12   indapamide (LOZOL) 1.25 MG tablet Take 1 tablet (1.25 mg total) by mouth daily. 90 tablet 0   insulin degludec (TRESIBA FLEXTOUCH) 100 UNIT/ML FlexTouch Pen Inject 34 Units into the skin daily. 30 mL 3   insulin glargine, 2 Unit Dial, (TOUJEO MAX SOLOSTAR) 300 UNIT/ML Solostar Pen Inject 34 Units into the skin daily in the afternoon. 15 mL 3   metFORMIN (GLUCOPHAGE) 1000 MG tablet Take 1 tablet (1,000 mg total) by mouth 2 (two) times daily with a meal. 180 tablet 3   Multiple Vitamin (MULTIVITAMIN WITH MINERALS) TABS tablet Take 1 tablet by mouth daily.     Nebivolol HCl 20 MG TABS Take 1  tablet (20 mg total) by mouth daily. 90 tablet 0   Semaglutide, 1 MG/DOSE, (OZEMPIC, 1 MG/DOSE,) 4 MG/3ML SOPN Inject 1 mg into the skin once a week. 9 mL 3   spironolactone (ALDACTONE) 50 MG tablet TAKE 1 TABLET BY MOUTH DAILY 30 tablet 0   tadalafil (CIALIS) 20 MG tablet TAKE ONE TABLET BY MOUTH DAILY AS NEEDED FOR ERECTILE DYSFUNCTION 10 tablet 1   Testosterone 20.25 MG/ACT (1.62%) GEL Place 2 Act onto the skin daily. 225 g 1   Cinnamon 500 MG capsule Take 1,000 mg by mouth daily.     losartan (COZAAR) 100 MG tablet Take 1 tablet (100 mg total) by mouth daily. 90 tablet 1   predniSONE (DELTASONE) 20 MG tablet Take 2 tablets (40 mg total) by mouth daily. 10 tablet 0   No facility-administered medications prior to visit.    ROS Review of Systems  Constitutional:  Negative for chills, diaphoresis, fatigue and fever.  HENT:  Positive for postnasal drip, rhinorrhea and trouble swallowing. Negative for nosebleeds, sinus pressure and sore throat.   Respiratory:  Positive for cough. Negative for chest tightness, shortness of breath and wheezing.   Cardiovascular:  Negative for chest pain, palpitations and leg swelling.  Gastrointestinal: Negative.  Negative for abdominal pain, diarrhea and nausea.  Genitourinary:  Negative for difficulty urinating.  Musculoskeletal: Negative.  Negative for arthralgias.  Skin: Negative.  Negative for color change.  Neurological:  Negative for dizziness, weakness, light-headedness and headaches.  Hematological:  Negative for adenopathy. Does not bruise/bleed easily.  Psychiatric/Behavioral: Negative.     Objective:  BP 132/86 (BP Location: Left Arm, Patient Position: Sitting, Cuff Size: Large)    Pulse 78    Temp 97.9 F (36.6 C) (Oral)    Resp 16    Ht 6\' 2"  (1.88 m)    Wt 270 lb 6 oz (122.6 kg)    SpO2 98%    BMI 34.71 kg/m   BP Readings from Last 3 Encounters:  11/16/21 132/86  10/24/21 (!) 158/100  09/22/21 120/80    Wt Readings from Last 3  Encounters:  11/16/21 270 lb 6 oz (122.6 kg)  09/22/21 269 lb 3.2 oz (122.1 kg)  07/07/21 270 lb (122.5 kg)    Physical Exam Vitals reviewed.  Constitutional:      General: He is not in acute distress.    Appearance: He is not ill-appearing, toxic-appearing or diaphoretic.  HENT:     Nose: Nose normal.     Mouth/Throat:     Mouth: Mucous membranes are moist.  Eyes:     General: No scleral icterus.    Conjunctiva/sclera: Conjunctivae normal.  Neck:     Thyroid: No thyroid mass or thyromegaly.     Comments: Healed scar, no signs of infection or complication Cardiovascular:     Rate and Rhythm: Normal rate and regular rhythm.     Heart sounds: No murmur heard. Pulmonary:     Effort: Pulmonary effort is normal.     Breath sounds: No stridor. No wheezing, rhonchi or rales.  Abdominal:     General: Abdomen is flat.     Palpations: There is no mass.     Tenderness: There is no abdominal tenderness. There is no guarding.     Hernia: No hernia is present.  Musculoskeletal:        General: Normal range of motion.     Cervical back: Neck supple. No erythema. Normal range of motion.     Right lower leg: No edema.     Left lower leg: No edema.  Lymphadenopathy:     Cervical: No cervical adenopathy.  Skin:    General: Skin is warm and dry.     Coloration: Skin is not pale.  Neurological:     General: No focal deficit present.     Mental Status: He is alert.  Psychiatric:        Mood and Affect: Mood normal.        Behavior: Behavior normal.    Lab Results  Component Value Date   WBC 15.9 (H) 11/16/2021   HGB 14.8 11/16/2021   HCT 46.1 11/16/2021   PLT 180.0 11/16/2021   GLUCOSE 105 (H) 09/22/2021   CHOL 112 09/22/2021   TRIG 176.0 (H) 09/22/2021   HDL 29.40 (L) 09/22/2021   LDLCALC 47 09/22/2021   NA 140 09/22/2021   K 4.1 09/22/2021   CL 106 09/22/2021   CREATININE 0.93 09/22/2021   BUN 16 09/22/2021   CO2 25 09/22/2021   TSH 1.25 02/22/2021   PSA 0.374  05/11/2020   HGBA1C 6.8 (A) 09/22/2021   MICROALBUR <0.7 09/22/2021    DG Chest 2 View  Result Date: 11/16/2021 CLINICAL DATA:  Cough EXAM: CHEST - 2 VIEW COMPARISON:  None. FINDINGS: Heart size and mediastinal contours are within normal limits. Lungs are hyperinflated. No suspicious pulmonary opacities identified. No pleural effusion  or pneumothorax visualized. No acute osseous abnormality appreciated. IMPRESSION: Hyperinflated lungs with no focal consolidation identified. Electronically Signed   By: Ofilia Neas M.D.   On: 11/16/2021 11:17     Assessment & Plan:   Kumar was seen today for hypertension and cough.  Diagnoses and all orders for this visit:  Primary hypertension- His blood pressure is adequately well controlled. -     CBC with Differential/Platelet; Future -     losartan (COZAAR) 100 MG tablet; Take 1 tablet (100 mg total) by mouth daily. -     CBC with Differential/Platelet  Subacute cough- His chest x-ray is negative for mass or infiltrate. -     DG Chest 2 View; Future -     CBC with Differential/Platelet; Future -     CBC with Differential/Platelet  Type 2 diabetes mellitus without complication, with long-term current use of insulin (HCC) -     losartan (COZAAR) 100 MG tablet; Take 1 tablet (100 mg total) by mouth daily.  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 -     Ambulatory referral to Hematology / Oncology  LRTI (lower respiratory tract infection)- Will treat with a 10-day course of Augmentin. -     amoxicillin-clavulanate (AUGMENTIN) 875-125 MG tablet; Take 1 tablet by mouth 2 (two) times daily for 10 days.   I have discontinued Fabio Asa Morton's Cinnamon and predniSONE. I am also having him start on amoxicillin-clavulanate. Additionally, I am having him maintain his glucose blood, multivitamin with minerals, amLODipine, Testosterone, tadalafil, atorvastatin, Ozempic (1 MG/DOSE), metFORMIN, Tresiba  FlexTouch, dapagliflozin propanediol, Toujeo Max SoloStar, Nebivolol HCl, indapamide, spironolactone, and losartan.  Meds ordered this encounter  Medications   losartan (COZAAR) 100 MG tablet    Sig: Take 1 tablet (100 mg total) by mouth daily.    Dispense:  90 tablet    Refill:  0   amoxicillin-clavulanate (AUGMENTIN) 875-125 MG tablet    Sig: Take 1 tablet by mouth 2 (two) times daily for 10 days.    Dispense:  20 tablet    Refill:  0     Follow-up: Return in about 3 months (around 02/13/2022).  Scarlette Calico, MD

## 2021-11-16 NOTE — Patient Instructions (Signed)

## 2021-11-17 DIAGNOSIS — D7289 Other specified disorders of white blood cells: Secondary | ICD-10-CM | POA: Insufficient documentation

## 2021-11-17 LAB — PATHOLOGIST SMEAR REVIEW

## 2021-11-18 ENCOUNTER — Encounter: Payer: Self-pay | Admitting: Internal Medicine

## 2021-11-18 DIAGNOSIS — J22 Unspecified acute lower respiratory infection: Secondary | ICD-10-CM | POA: Insufficient documentation

## 2021-11-18 MED ORDER — AMOXICILLIN-POT CLAVULANATE 875-125 MG PO TABS: 1.0000 | ORAL_TABLET | Freq: Two times a day (BID) | ORAL | 0 refills | Status: AC

## 2021-11-20 ENCOUNTER — Encounter: Payer: Self-pay | Admitting: Surgery

## 2021-11-20 LAB — HM DIABETES EYE EXAM

## 2021-11-21 ENCOUNTER — Other Ambulatory Visit: Payer: Self-pay | Admitting: Internal Medicine

## 2021-11-21 ENCOUNTER — Telehealth: Payer: Self-pay | Admitting: Hematology and Oncology

## 2021-11-21 DIAGNOSIS — N5201 Erectile dysfunction due to arterial insufficiency: Secondary | ICD-10-CM

## 2021-11-21 NOTE — Telephone Encounter (Signed)
Scheduled appt per 2/3 referral. Spoke to pt who is aware of appt date and time. Pt is aware to arrive 15 mins prior to appt time.

## 2021-11-30 ENCOUNTER — Ambulatory Visit: Payer: Commercial Managed Care - PPO | Admitting: Internal Medicine

## 2021-12-01 ENCOUNTER — Ambulatory Visit: Payer: Commercial Managed Care - PPO | Admitting: Internal Medicine

## 2021-12-01 ENCOUNTER — Encounter: Payer: Self-pay | Admitting: Internal Medicine

## 2021-12-01 ENCOUNTER — Other Ambulatory Visit: Payer: Self-pay

## 2021-12-01 VITALS — BP 134/86 | HR 89 | Ht 74.0 in | Wt 273.0 lb

## 2021-12-01 DIAGNOSIS — Z9009 Acquired absence of other part of head and neck: Secondary | ICD-10-CM | POA: Diagnosis not present

## 2021-12-01 DIAGNOSIS — E119 Type 2 diabetes mellitus without complications: Secondary | ICD-10-CM | POA: Diagnosis not present

## 2021-12-01 DIAGNOSIS — Z794 Long term (current) use of insulin: Secondary | ICD-10-CM

## 2021-12-01 LAB — POCT GLUCOSE (DEVICE FOR HOME USE): Glucose Fasting, POC: 144 mg/dL — AB (ref 70–99)

## 2021-12-01 NOTE — Progress Notes (Signed)
Name: James Parrish  Age/ Sex: 56 y.o., male   MRN/ DOB: [id number], [birth year]     PCP: Janith Lima, MD   Reason for Endocrinology Evaluation: Type 2 Diabetes Mellitus  Initial Endocrine Consultative Visit: 07/13/2019    PATIENT IDENTIFIER: [name] is a 56 y.o. male with a past medical history of T2Dm, HTN and Dyslipidemia. The patient has followed with Endocrinology clinic since 07/13/2019 for consultative assistance with management of his diabetes.  DIABETIC HISTORY:  Mr. Reichel was diagnosed with DM prior to 2010, was initially on oral glycemic agents, insulin started in 2013. His hemoglobin A1c has ranged from 7.2% in 2018, peaking at 8.8% in 2019.  On his initial visit to our clinic, his A1c 7.7% . He was on lantus, victoza, metformin and Jardiance. We switched Victoza to Ozempic.    He is a truck driver SUBJECTIVE:   During the last visit (09/22/2021): A1c 6.8% . We continued Ozempic, Lantus, Wilder Glade and  Metformin   Today (12/01/2021): Mr. Fassnacht is here for a  follow up on diabetes. He is accompanied by his wife [name].  He checks his blood sugars 1 times . The patient has not had hypoglycemic episodes since the last clinic visit.    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, negative for malignancy  Denies nausea , vomiting or diarrhea  Weight has been stable     HOME DIABETES REGIMEN:  Ozempic 1.0 mg weekly ( Fridays )  Farxiga 10 mg daily  Metformin 1000 mg Twice daily  Toujeo  34 units daily      DIABETIC COMPLICATIONS: Microvascular complications:    Denies: CKD, retinopathy, neuropathy  Last eye exam: Completed 11/2020   Macrovascular complications:    Denies: CAD, PVD, CVA  METER DOWNLOAD SUMMARY:Did not bring     HISTORY:  Past Medical History:  Past Medical History:  Diagnosis Date   Complication of anesthesia    hard to  wake up    Diabetes mellitus without complication ([place])    ED (erectile dysfunction)    History of COVID-19 04/2019   Hyperlipidemia    Hypertension    Sleep apnea    uses CPAP nightly   Past Surgical History:  Past Surgical History:  Procedure Laterality Date   BRONCHIAL NEEDLE ASPIRATION BIOPSY  05/29/2021   Procedure: BRONCHIAL NEEDLE ASPIRATION BIOPSIES;  Surgeon: Garner Nash, DO;  Location: [facility];  Service: Pulmonary;;   COLONOSCOPY     finger surgery Right    Index   KNEE SURGERY Left    KNEE SURGERY Right    ORIF PATELLA Right    REVERSE SHOULDER ARTHROPLASTY Right 08/31/2020   Procedure: REVERSE SHOULDER ARTHROPLASTY;  Surgeon: Hiram Gash, MD;  Location: [place];  Service: Orthopedics;  Laterality: Right;   SHOULDER ARTHROSCOPY WITH ROTATOR CUFF REPAIR Left 02/04/2020   Procedure: SHOULDER ARTHROSCOPY WITH ROTATOR CUFF REPAIR WITH BICEP TENODESIS;  Surgeon: Hiram Gash, MD;  Location: [place];  Service: Orthopedics;  Laterality: Left;   VIDEO BRONCHOSCOPY WITH ENDOBRONCHIAL ULTRASOUND Left 05/29/2021   Procedure: VIDEO BRONCHOSCOPY WITH ENDOBRONCHIAL ULTRASOUND;  Surgeon: Garner Nash, DO;  Location: [place];  Service: Pulmonary;  Laterality: Left;   Social History:  reports that he quit smoking about 19 months ago. His smoking use included cigarettes. He has a 37.50 pack-year smoking history. He has never used  smokeless tobacco. He reports current alcohol use. He reports that he does not use drugs. Family History:  Family History  Problem Relation Age of Onset   Cancer Mother        breast cancer    Hypertension Mother    Diabetes Mother    Venous thrombosis Son    Colon cancer Neg Hx    Esophageal cancer Neg Hx    Stomach cancer Neg Hx    Rectal cancer Neg Hx      HOME MEDICATIONS: Allergies as of 12/01/2021   No Known Allergies      Medication List        Accurate as of December 01, 2021  1:08  PM. If you have any questions, ask your nurse or doctor.          amLODipine 10 MG tablet Commonly known as: NORVASC Take 1 tablet (10 mg total) by mouth at bedtime.   atorvastatin 10 MG tablet Commonly known as: LIPITOR Take 1 tablet (10 mg total) by mouth daily.   dapagliflozin propanediol 10 MG Tabs tablet Commonly known as: Farxiga Take 1 tablet (10 mg total) by mouth daily before breakfast.   glucose blood test strip Use as instructed to test blood sugar 2 times daily E11.65    Onetouch strips   indapamide 1.25 MG tablet Commonly known as: LOZOL Take 1 tablet (1.25 mg total) by mouth daily.   losartan 100 MG tablet Commonly known as: COZAAR Take 1 tablet (100 mg total) by mouth daily.   metFORMIN 1000 MG tablet Commonly known as: GLUCOPHAGE Take 1 tablet (1,000 mg total) by mouth 2 (two) times daily with a meal.   multivitamin with minerals Tabs tablet Take 1 tablet by mouth daily.   Nebivolol HCl 20 MG Tabs Take 1 tablet (20 mg total) by mouth daily.   Ozempic (1 MG/DOSE) 4 MG/3ML Sopn Generic drug: Semaglutide (1 MG/DOSE) Inject 1 mg into the skin once a week.   spironolactone 50 MG tablet Commonly known as: ALDACTONE TAKE 1 TABLET BY MOUTH DAILY   tadalafil 20 MG tablet Commonly known as: CIALIS TAKE 1 TABLET BY MOUTH DAILY AS NEEDED FOR ERECTILE DYSFUNCTION   Testosterone 20.25 MG/ACT (1.62%) Gel Place 2 Act onto the skin daily.   Toujeo Max SoloStar 300 UNIT/ML Solostar Pen Generic drug: insulin glargine (2 Unit Dial) Inject 34 Units into the skin daily in the afternoon.   Tyler Aas FlexTouch 100 UNIT/ML FlexTouch Pen Generic drug: insulin degludec Inject 34 Units into the skin daily.         OBJECTIVE:   Vital Signs: BP 134/86 (BP Location: Left Arm, Patient Position: Sitting, Cuff Size: Small)    Pulse 89    Ht 6\' 2"  (1.88 m)    Wt 273 lb (123.8 kg)    SpO2 95%    BMI 35.05 kg/m    Wt Readings from Last 3 Encounters:  11/16/21 270 lb  6 oz (122.6 kg)  09/22/21 269 lb 3.2 oz (122.1 kg)  07/07/21 270 lb (122.5 kg)     Exam: General: Pt appears well and is in NAD  Lungs: Clear with good BS bilat with no rales, rhonchi, or wheezes  Heart: RRR with normal S1 and S2 and no gallops; no murmurs; no rub  Abdomen: Normoactive bowel sounds, soft, nontender, without masses or organomegaly palpable  Extremities: No pretibial edema.   Neuro: MS is good with appropriate affect, pt is alert and Ox3    DM foot  exam: 12/01/2020 The skin of the feet is intact without sores or ulcerations. The pedal pulses are 2+ on right and 2+ on left. The sensation is intact to a screening 5.07, 10 gram monofilament bilaterally     DATA REVIEWED:  Lab Results  Component Value Date   HGBA1C 6.8 (A) 09/22/2021   HGBA1C 6.8 (A) 04/11/2021   HGBA1C 7.0 (A) 12/01/2020    Latest Reference Range & Units 12/01/21 15:48  TSH 0.450 - 4.500 uIU/mL 2.130  T4,Free(Direct) 0.82 - 1.77 ng/dL 1.16       Latest Reference Range & Units 09/22/21 11:13  Sodium 135 - 145 mEq/L 140  Potassium 3.5 - 5.1 mEq/L 4.1  Chloride 96 - 112 mEq/L 106  CO2 19 - 32 mEq/L 25  Glucose 70 - 99 mg/dL 105 (H)  BUN 6 - 23 mg/dL 16  Creatinine 0.40 - 1.50 mg/dL 0.93  Calcium 8.4 - 10.5 mg/dL 9.6  GFR >60.00 mL/min 90.99  Total CHOL/HDL Ratio  4  Cholesterol 0 - 200 mg/dL 112  HDL Cholesterol >39.00 mg/dL 29.40 (L)  LDL (calc) 0 - 99 mg/dL 47  MICROALB/CREAT RATIO 0.0 - 30.0 mg/g 0.6  NonHDL  82.24  Triglycerides 0.0 - 149.0 mg/dL 176.0 (H)  VLDL 0.0 - 40.0 mg/dL 35.2    ASSESSMENT / PLAN / RECOMMENDATIONS:   1) Type 2 Diabetes Mellitus, Optimally controlled, Without  complications - Most recent A1c of 6.6 %. Goal A1c < 7.0 %.     - A1c continues to be optimal - No changes  MEDICATIONS: -Continue Ozempic 1.0 mg weekly  -Continue Farxiga 10 mg daily  mg daily  -Continue Metformin 1000 mg Twice daily  -Continue Toujeo 34 units daily    EDUCATION /  INSTRUCTIONS: BG monitoring instructions: Patient is instructed to check his blood sugars 1 time a day. Call [facility] if: BG persistently < 70  I reviewed the Rule of 15 for the treatment of hypoglycemia in detail with the patient. Literature supplied.     3) Dyslipidemia:  -TG at 176 mg/DL, trended down from 275 mg/DL -LDL at goal  Continue atorvastatin 10 mg daily    4)S/P left lobectomy:  -Pathology report was benign -Patient is clinically euthyroid -TSH normal     F/U in 6 months    Signed electronically by: Mack Guise, MD  [facility]  [facility] [address] [place], [place] [address] Phone: [phone] FAX: [phone]   CC: Janith Lima, [place] [address] Phone: [phone]  Fax: [phone]  Return to Endocrinology clinic as below: Future Appointments  Date Time Provider [place]  12/01/2021  3:20 PM Demaris Bousquet, Melanie Crazier, MD [facility] None  12/14/2021  1:00 PM Orson Slick, MD [facility] None  12/14/2021  1:45 PM [facility] None

## 2021-12-02 LAB — TSH: TSH: 2.13 u[IU]/mL (ref 0.450–4.500)

## 2021-12-02 LAB — T4, FREE: Free T4: 1.16 ng/dL (ref 0.82–1.77)

## 2021-12-14 ENCOUNTER — Other Ambulatory Visit: Payer: Self-pay

## 2021-12-14 ENCOUNTER — Encounter: Payer: Self-pay | Admitting: Hematology and Oncology

## 2021-12-14 ENCOUNTER — Inpatient Hospital Stay: Payer: Commercial Managed Care - PPO

## 2021-12-14 ENCOUNTER — Inpatient Hospital Stay: Payer: Commercial Managed Care - PPO | Attending: Hematology and Oncology | Admitting: Hematology and Oncology

## 2021-12-14 VITALS — BP 128/86 | HR 93 | Temp 97.2°F | Resp 17 | Wt 271.1 lb

## 2021-12-14 DIAGNOSIS — D72820 Lymphocytosis (symptomatic): Secondary | ICD-10-CM | POA: Diagnosis not present

## 2021-12-14 DIAGNOSIS — Z803 Family history of malignant neoplasm of breast: Secondary | ICD-10-CM | POA: Diagnosis not present

## 2021-12-14 DIAGNOSIS — Z800 Family history of malignant neoplasm of digestive organs: Secondary | ICD-10-CM | POA: Insufficient documentation

## 2021-12-14 DIAGNOSIS — R718 Other abnormality of red blood cells: Secondary | ICD-10-CM | POA: Diagnosis not present

## 2021-12-14 DIAGNOSIS — Z87891 Personal history of nicotine dependence: Secondary | ICD-10-CM | POA: Diagnosis not present

## 2021-12-14 DIAGNOSIS — G4733 Obstructive sleep apnea (adult) (pediatric): Secondary | ICD-10-CM | POA: Insufficient documentation

## 2021-12-14 DIAGNOSIS — D7289 Other specified disorders of white blood cells: Secondary | ICD-10-CM

## 2021-12-14 LAB — C-REACTIVE PROTEIN: CRP: 1.1 mg/dL — ABNORMAL HIGH (ref <1.0)

## 2021-12-14 LAB — CMP (CANCER CENTER ONLY)
ALT: 21 U/L (ref 0–44)
AST: 18 U/L (ref 15–41)
Albumin: 4.5 g/dL (ref 3.5–5.0)
Alkaline Phosphatase: 52 U/L (ref 38–126)
Anion gap: 7 (ref 5–15)
BUN: 18 mg/dL (ref 6–20)
CO2: 29 mmol/L (ref 22–32)
Calcium: 9.7 mg/dL (ref 8.9–10.3)
Chloride: 107 mmol/L (ref 98–111)
Creatinine: 1.08 mg/dL (ref 0.61–1.24)
GFR, Estimated: >60 mL/min (ref >60)
Glucose, Bld: 140 mg/dL — ABNORMAL HIGH (ref 70–99)
Potassium: 3.9 mmol/L (ref 3.5–5.1)
Sodium: 143 mmol/L (ref 135–145)
Total Bilirubin: 0.4 mg/dL (ref 0.3–1.2)
Total Protein: 7.2 g/dL (ref 6.5–8.1)

## 2021-12-14 LAB — CBC WITH DIFFERENTIAL (CANCER CENTER ONLY)
Abs Immature Granulocytes: 0.06 10*3/uL (ref 0.00–0.07)
Basophils Absolute: 0.1 10*3/uL (ref 0.0–0.1)
Basophils Relative: 1 %
Eosinophils Absolute: 0.1 10*3/uL (ref 0.0–0.5)
Eosinophils Relative: 0 %
HCT: 47 % (ref 39.0–52.0)
Hemoglobin: 15.1 g/dL (ref 13.0–17.0)
Immature Granulocytes: 0 %
Lymphocytes Relative: 71 %
Lymphs Abs: 12 10*3/uL — ABNORMAL HIGH (ref 0.7–4.0)
MCH: 24.7 pg — ABNORMAL LOW (ref 26.0–34.0)
MCHC: 32.1 g/dL (ref 30.0–36.0)
MCV: 76.8 fL — ABNORMAL LOW (ref 80.0–100.0)
Monocytes Absolute: 0.6 10*3/uL (ref 0.1–1.0)
Monocytes Relative: 3 %
Neutro Abs: 4.3 10*3/uL (ref 1.7–7.7)
Neutrophils Relative %: 25 %
Platelet Count: 255 10*3/uL (ref 150–400)
RBC: 6.12 MIL/uL — ABNORMAL HIGH (ref 4.22–5.81)
RDW: 15.5 % (ref 11.5–15.5)
Smear Review: NORMAL
WBC Count: 17.1 10*3/uL — ABNORMAL HIGH (ref 4.0–10.5)
nRBC: 0 % (ref 0.0–0.2)

## 2021-12-14 LAB — LACTATE DEHYDROGENASE: LDH: 140 U/L (ref 98–192)

## 2021-12-14 LAB — SEDIMENTATION RATE: Sed Rate: 0 mm/hr (ref 0–16)

## 2021-12-14 LAB — SAVE SMEAR(SSMR), FOR PROVIDER SLIDE REVIEW

## 2021-12-14 NOTE — Progress Notes (Signed)
[facility] [phone]) [phone]   Fax:([phone]) [place] NOTE  Patient Care Team: Janith Lima, MD as PCP - General (Internal Medicine) Sueanne Margarita, MD as PCP - Sleep Medicine (Cardiology)  Hematological/Oncological History # Atypical Lymphocytes on Routine CBC 01/25/2020: WBC 9.6, Hgb 15.6, MCV 79.1, Plt 241 05/29/2021: WBC 13.3, Hgb 15.4, MCV 76.9, Plt 203 11/16/2021: patient had routine CBC collected, showed WBC 15.9, Hgb 14.8, MCV 76.4, ALC 11.6 12/14/2021: establish care with Dr. Lorenso Courier   CHIEF COMPLAINTS/PURPOSE OF CONSULTATION: "Atypical Lymphocytes "  HISTORY OF PRESENTING ILLNESS:  James Parrish 56 y.o. male with medical history significant for type 2 diabetes, hyperlipidemia, hypertension, sleep apnea who presents for evaluation of lymphocytosis.  On review of the previous records James Parrish last had a normal CBC on 01/25/2020 at which time it was blood cell count 9.6, hemoglobin 15.6, MCV 79.1, and platelets of 241.  On 05/30/2019 the patient noted to have white blood cell count 13.3, hemoglobin 15.4, MCV of 76.9 with platelets of 203.  Labs drawn on 11/16/2021 showed white blood cell count 15.9, hemoglobin 15.8, and a affluent slight count of 11.6.  Due to concern for this leukocytosis and lymphocytosis he was referred to hematology for further evaluation management  On exam today James Parrish reports that in February 2023 he was suffering from a head cold, cough, phlegm and underwent a chest X that time.  He was given a course of antibiotic therapy which she has completed.  He notes his infection has cleared we does still have some cough and congestion.  He does also still have some nasal congestion.  He notes throughout the course that infection he does not have any fevers, chills, sweats.  He also notes that he did not have any lymphadenopathy.  He notes that his weight has been stable and otherwise he has been in a good state of health.  On  further discussion he notes that he was a former smoker having quit 2 years ago.  He notes that he rarely drinks beer and last had one in October 2022.  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  His family history is markable for breast cancer in his mother.  His father passed away of cirrhosis and type 2 diabetes.  He has a son with Paget's disease and has a healthy daughter.  He also has obstructive sleep apnea and uses a CPAP machine faithfully every night.  He does have rare occasions where he does not wear it due to being exhausted and falling asleep before he has the opportunity to apply it.  He currently denies any fevers, chills, sweats, nausea, ming or diarrhea.  A full 10 point ROS is listed below.  MEDICAL HISTORY:  Past Medical History:  Diagnosis Date   Complication of anesthesia    hard to wake up    Diabetes mellitus without complication [facility])    ED (erectile dysfunction)    History of COVID-19 04/2019   Hyperlipidemia    Hypertension    Sleep apnea    uses CPAP nightly    SURGICAL HISTORY: Past Surgical History:  Procedure Laterality Date   BRONCHIAL NEEDLE ASPIRATION BIOPSY  05/29/2021   Procedure: BRONCHIAL NEEDLE ASPIRATION BIOPSIES;  Surgeon: Garner Nash, DO;  Location: [facility];  Service: Pulmonary;;   COLONOSCOPY     finger surgery Right    Index   KNEE SURGERY Left    KNEE SURGERY Right  ORIF PATELLA Right    REVERSE SHOULDER ARTHROPLASTY Right 08/31/2020   Procedure: REVERSE SHOULDER ARTHROPLASTY;  Surgeon: Hiram Gash, MD;  Location: [place];  Service: Orthopedics;  Laterality: Right;   SHOULDER ARTHROSCOPY WITH ROTATOR CUFF REPAIR Left 02/04/2020   Procedure: SHOULDER ARTHROSCOPY WITH ROTATOR CUFF REPAIR WITH BICEP TENODESIS;  Surgeon: Hiram Gash, MD;  Location: [place];  Service: Orthopedics;  Laterality: Left;   VIDEO BRONCHOSCOPY WITH ENDOBRONCHIAL ULTRASOUND Left 05/29/2021   Procedure:  VIDEO BRONCHOSCOPY WITH ENDOBRONCHIAL ULTRASOUND;  Surgeon: Garner Nash, DO;  Location: [place];  Service: Pulmonary;  Laterality: Left;    SOCIAL HISTORY: Social History   Socioeconomic History   Marital status: Married    Spouse name: Not on file   Number of children: Not on file   Years of education: Not on file   Highest education level: Not on file  Occupational History   Not on file  Tobacco Use   Smoking status: Former    Packs/day: 1.50    Years: 25.00    Pack years: 37.50    Types: Cigarettes    Quit date: 04/2020    Years since quitting: 1.6   Smokeless tobacco: Never  Vaping Use   Vaping Use: Never used  Substance and Sexual Activity   Alcohol use: Yes    Comment: occasionally   Drug use: Never   Sexual activity: Yes  Other Topics Concern   Not on file  Social History Narrative   Not on file   Social Determinants of Health   Financial Resource Strain: Not on file  Food Insecurity: Not on file  Transportation Needs: Not on file  Physical Activity: Not on file  Stress: Not on file  Social Connections: Not on file  Intimate Partner Violence: Not on file    FAMILY HISTORY: Family History  Problem Relation Age of Onset   Cancer Mother        breast cancer    Hypertension Mother    Diabetes Mother    Venous thrombosis Son    Colon cancer Neg Hx    Esophageal cancer Neg Hx    Stomach cancer Neg Hx    Rectal cancer Neg Hx     ALLERGIES:  has No Known Allergies.  MEDICATIONS:  Current Outpatient Medications  Medication Sig Dispense Refill   amLODipine (NORVASC) 10 MG tablet Take 1 tablet (10 mg total) by mouth at bedtime. 90 tablet 1   atorvastatin (LIPITOR) 10 MG tablet Take 1 tablet (10 mg total) by mouth daily. 90 tablet 3   dapagliflozin propanediol (FARXIGA) 10 MG TABS tablet Take 1 tablet (10 mg total) by mouth daily before breakfast. 90 tablet 3   glucose blood test strip Use as instructed to test blood sugar 2 times daily E11.65     Onetouch strips 100 each 12   indapamide (LOZOL) 1.25 MG tablet Take 1 tablet (1.25 mg total) by mouth daily. 90 tablet 0   insulin glargine, 2 Unit Dial, (TOUJEO MAX SOLOSTAR) 300 UNIT/ML Solostar Pen Inject 34 Units into the skin daily in the afternoon. 15 mL 3   losartan (COZAAR) 100 MG tablet Take 1 tablet (100 mg total) by mouth daily. 90 tablet 0   metFORMIN (GLUCOPHAGE) 1000 MG tablet Take 1 tablet (1,000 mg total) by mouth 2 (two) times daily with a meal. 180 tablet 3   Multiple Vitamin (MULTIVITAMIN WITH MINERALS) TABS tablet Take 1 tablet by mouth daily.  Nebivolol HCl 20 MG TABS Take 1 tablet (20 mg total) by mouth daily. 90 tablet 0   Semaglutide, 1 MG/DOSE, (OZEMPIC, 1 MG/DOSE,) 4 MG/3ML SOPN Inject 1 mg into the skin once a week. 9 mL 3   spironolactone (ALDACTONE) 50 MG tablet TAKE 1 TABLET BY MOUTH DAILY 30 tablet 0   tadalafil (CIALIS) 20 MG tablet TAKE 1 TABLET BY MOUTH DAILY AS NEEDED FOR ERECTILE DYSFUNCTION 10 tablet 1   Testosterone 20.25 MG/ACT (1.62%) GEL Place 2 Act onto the skin daily. 225 g 1   No current facility-administered medications for this visit.    REVIEW OF SYSTEMS:   Constitutional: ( - ) fevers, ( - )  chills , ( - ) night sweats Eyes: ( - ) blurriness of vision, ( - ) double vision, ( - ) watery eyes Ears, nose, mouth, throat, and face: ( - ) mucositis, ( - ) sore throat Respiratory: ( - ) cough, ( - ) dyspnea, ( - ) wheezes Cardiovascular: ( - ) palpitation, ( - ) chest discomfort, ( - ) lower extremity swelling Gastrointestinal:  ( - ) nausea, ( - ) heartburn, ( - ) change in bowel habits Skin: ( - ) abnormal skin rashes Lymphatics: ( - ) new lymphadenopathy, ( - ) easy bruising Neurological: ( - ) numbness, ( - ) tingling, ( - ) new weaknesses Behavioral/Psych: ( - ) mood change, ( - ) new changes  All other systems were reviewed with the patient and are negative.  PHYSICAL EXAMINATION:  Vitals:   12/14/21 1323  BP: 128/86  Pulse: 93   Resp: 17  Temp: (!) 97.2 F (36.2 C)  SpO2: 96%   Filed Weights   12/14/21 1323  Weight: 271 lb 2 oz (123 kg)    GENERAL: well appearing middle-aged African-American male in NAD  SKIN: skin color, texture, turgor are normal, no rashes or significant lesions NECK/LYMPH: Midline scar at the base of the neck from prior surgery.  No evidence of bulky lymphadenopathy though suspicious for enlarged lymph node on left cervical chain. EYES: conjunctiva are pink and non-injected, sclera clear LUNGS: clear to auscultation and percussion with normal breathing effort HEART: regular rate & rhythm and no murmurs and no lower extremity edema Musculoskeletal: no cyanosis of digits and no clubbing  PSYCH: alert & oriented x 3, fluent speech NEURO: no focal motor/sensory deficits  LABORATORY DATA:  I have reviewed the data as listed CBC Latest Ref Rng & Units 11/16/2021 05/29/2021 01/25/2020  WBC 4.0 - 10.5 K/uL 15.9(H) 13.3(H) 9.6  Hemoglobin 13.0 - 17.0 g/dL 14.8 15.4 15.6  Hematocrit 39.0 - 52.0 % 46.1 47.9 49.5  Platelets 150.0 - 400.0 K/uL 180.0 203 241    CMP Latest Ref Rng & Units 09/22/2021 08/29/2020 01/25/2020  Glucose 70 - 99 mg/dL 105(H) 115(H) 137(H)  BUN 6 - 23 mg/dL _0 Creatinine 0.40 - 1.50 mg/dL 0.93 0.89 0.87  Sodium 135 - 145 mEq/L 140 139 138  Potassium 3.5 - 5.1 mEq/L 4.1 4.4 4.4  Chloride 96 - 112 mEq/L 106 104 104  CO2 19 - 32 mEq/L _1 Calcium 8.4 - 10.5 mg/dL 9.6 9.0 9.1     ASSESSMENT & PLAN James Parrish 56 y.o. male with medical history significant for type 2 diabetes, hyperlipidemia, hypertension, sleep apnea who presents for evaluation of lymphocytosis.  After review of the labs, review of the records, and discussion with the patient the patients findings are  most consistent with a chronic lymphocytosis which has been increasing over time.  Findings are concerning for a malignant process such as CLL or other lymphoma.  Lymphocytosis is an elevation  in the lymphocyte cells in the blood. This may indicate several possible conditions including hematological malignancies, transient elevation from infection, or response to inflammation. Lymphocytes may also be elevated after splenectomy. The workup for lymphocytosis consists of determining if the lymphocytes are malignant in nature. The best test to determine this is flow cytometry, which can help determine if the lymphocytes are a monoclonal population.  Additional workup includes inflammatory workup and detailed history to assure no infectious symptoms.    #Lymphocytosis  --labs to include CBC, CMP, LDH, and flow cytometry  --additional studies to include ESR and CRP   --no evidence of lymphadenopathy on physical exam. Patient has no history of splenectomy.   --RTC in 3 months or sooner if indicated by the above labs.    #Persistent Microcytosis --Pathology review of peripheral blood smear is concerning for thalassemia --We will order a hemoglobin fractionation cascade to assess for hemoglobinopathy  Orders Placed This Encounter  Procedures   CBC with Differential ([place] Only)    Standing Status:   Future    Number of Occurrences:   1    Standing Expiration Date:   12/15/2022   CMP ([place] only)    Standing Status:   Future    Number of Occurrences:   1    Standing Expiration Date:   12/15/2022   Lactate dehydrogenase (LDH)    Standing Status:   Future    Number of Occurrences:   1    Standing Expiration Date:   12/14/2022   Sedimentation rate    Standing Status:   Future    Number of Occurrences:   1    Standing Expiration Date:   12/14/2022   Flow Cytometry, Peripheral Blood (Oncology)    Standing Status:   Future    Number of Occurrences:   1    Standing Expiration Date:   12/15/2022   C-reactive protein    Standing Status:   Future    Number of Occurrences:   1    Standing Expiration Date:   12/14/2022   Save Smear (SSMR)    Standing Status:   Future    Number of  Occurrences:   1    Standing Expiration Date:   12/15/2022   Hgb Fractionation Cascade    Standing Status:   Future    Number of Occurrences:   1    Standing Expiration Date:   12/15/2022    All questions were answered. The patient knows to call the clinic with any problems, questions or concerns.  A total of more than 60 minutes were spent on this encounter with face-to-face time and non-face-to-face time, including preparing to see the patient, ordering tests and/or medications, counseling the patient and coordination of care as outlined above.   Ledell Peoples, MD Department of Hematology/Oncology [place] [facility] Phone: [phone] Pager: [phone] Email: [email or web address]  12/14/2021 2:07 PM

## 2021-12-15 ENCOUNTER — Telehealth: Payer: Self-pay | Admitting: Hematology and Oncology

## 2021-12-15 LAB — SURGICAL PATHOLOGY

## 2021-12-15 NOTE — Telephone Encounter (Signed)
Scheduled per 3/2 los, message has been left with pt ?

## 2021-12-18 LAB — HGB FRACTIONATION CASCADE
Hgb A2: 2.1 % (ref 1.8–3.2)
Hgb A: 97.9 % (ref 96.4–98.8)
Hgb F: 0 % (ref 0.0–2.0)
Hgb S: 0 %

## 2021-12-18 LAB — FLOW CYTOMETRY

## 2021-12-19 ENCOUNTER — Telehealth: Payer: Self-pay | Admitting: Hematology and Oncology

## 2021-12-19 NOTE — Telephone Encounter (Signed)
Called Mr. Leasure to discuss the results of his blood work.  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  During her prior clinic visit we discussed that this was the most likely etiology for his elevated white blood cell count.  Fortunately his hemoglobin and platelets are within normal limits and he is not having any symptoms as a result of lymphadenopathy/splenomegaly.  Given this he does not require any treatment at this time. ? ?I was unable to reach the patient by phone and therefore left a message requesting callback.  Okay to inform patient that we have confirmed the diagnosis of CLL and the no treatment is required at this time.  We will see him back in June 2023. ? ?Ledell Peoples, MD ?Department of Hematology/Oncology ?[place] [facility] ?Phone: [phone] ?Pager: [phone] ?Email: [email or web address] ? ?

## 2021-12-21 ENCOUNTER — Ambulatory Visit: Payer: Commercial Managed Care - PPO | Admitting: Internal Medicine

## 2021-12-25 ENCOUNTER — Other Ambulatory Visit: Payer: Self-pay | Admitting: Internal Medicine

## 2021-12-25 ENCOUNTER — Telehealth: Payer: Self-pay | Admitting: Internal Medicine

## 2021-12-25 DIAGNOSIS — I10 Essential (primary) hypertension: Secondary | ICD-10-CM

## 2021-12-25 MED ORDER — SPIRONOLACTONE 50 MG PO TABS: 50.0000 mg | ORAL_TABLET | Freq: Every day | ORAL | 0 refills | Status: DC

## 2021-12-25 MED ORDER — AMLODIPINE BESYLATE 10 MG PO TABS: 10.0000 mg | ORAL_TABLET | Freq: Every day | ORAL | 0 refills | Status: DC

## 2021-12-25 NOTE — Telephone Encounter (Signed)
1.Medication Requested: spironolactone (ALDACTONE) 50 MG tablet ?amLODipine (NORVASC) 10 MG tablet ? ? ?2. Pharmacy (Name, Street, [facility]): [id number] - [place], [place]  ?Phone:  [phone] ?Fax:  [phone] ? ? ?3. On Med List: yes ? ?4. Last Visit with PCP: 02.02.23 ? ?5. Next visit date with PCP: n/a ? ? ?Agent: Please be advised that RX refills may take up to 3 business days. We ask that you follow-up with your pharmacy.  ?

## 2022-01-02 ENCOUNTER — Encounter: Payer: Self-pay | Admitting: Hematology and Oncology

## 2022-01-04 ENCOUNTER — Encounter: Payer: Self-pay | Admitting: Physician Assistant

## 2022-01-05 ENCOUNTER — Other Ambulatory Visit: Payer: Self-pay | Admitting: Internal Medicine

## 2022-01-05 DIAGNOSIS — I10 Essential (primary) hypertension: Secondary | ICD-10-CM

## 2022-01-11 ENCOUNTER — Telehealth: Payer: Self-pay | Admitting: Internal Medicine

## 2022-01-11 ENCOUNTER — Telehealth: Payer: Self-pay

## 2022-01-11 NOTE — Telephone Encounter (Signed)
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 Also advised that there is a charge for provider to fill out paperwork.  ?

## 2022-01-11 NOTE — Telephone Encounter (Signed)
PT visits today with forms to be filled out by Dr.Jones! Forms have been left in the provider's mail box! ? ?CB: [phone] ? ? ?

## 2022-01-11 NOTE — Telephone Encounter (Signed)
Patient dropped of paperwork for [employer] - placed in provider box at front desk ?

## 2022-01-12 ENCOUNTER — Encounter: Payer: Self-pay | Admitting: Internal Medicine

## 2022-01-12 ENCOUNTER — Other Ambulatory Visit: Payer: Self-pay | Admitting: Internal Medicine

## 2022-01-12 DIAGNOSIS — I10 Essential (primary) hypertension: Secondary | ICD-10-CM

## 2022-01-12 NOTE — Telephone Encounter (Signed)
Forms have been completed and faxed back.

## 2022-01-12 NOTE — Telephone Encounter (Signed)
Copy has been placed up front for pt to pick up.  ?

## 2022-01-12 NOTE — Telephone Encounter (Signed)
Pt checking completion of forms ? ?Informed pt forms was faxed today 3-31 ? ?Pt states they have not received the fax and will come by today to pick up the forms ?

## 2022-01-20 IMAGING — NM NM THYROID IMAGING ONLY
1 series · 6 of 6 positions shown · non-contrast
Comparison: PET-CT 02/06/2021, CT chest 01/04/2021

CLINICAL DATA: Mediastinal mass. Evaluate for thyroid tissue origin
of substernal mass.

EXAM:
THYROID SCAN with SPECT
TECHNIQUE: Following the intravenous administration of radiopharmaceutical, pin
hole collimated images were obtained of the thyroid gland. SPECT
imaging of the neck and chest was subsequently performed.
RADIOPHARMACEUTICALS:  450 microcuries I 123

[Series 2: spect thyroid · 4.14mm/px · 6 of 96 frames shown]
[frame 9/96  full-range]
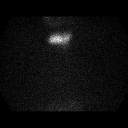
[frame 25/96  full-range]
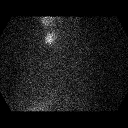
[frame 41/96  full-range]
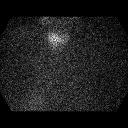
[frame 57/96  full-range]
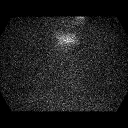
[frame 73/96  full-range]
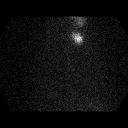
[frame 89/96  full-range]
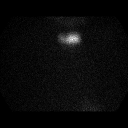

[6 of 6 positions shown; findings below may reference images not displayed]

FINDINGS: There is no significant radiotracer accumulation associated with the
mediastinal mass in deep anterior mediastinum LEFT adjacent to the
trachea. There is intense radiotracer accumulation within the
thyroid gland.
IMPRESSION: No iodine isotope accumulation within the upper mediastinal mass
which indicates that the mass is NOT differentiated thyroid tissue
origin.

## 2022-01-25 ENCOUNTER — Other Ambulatory Visit: Payer: Self-pay | Admitting: Internal Medicine

## 2022-01-25 DIAGNOSIS — N5201 Erectile dysfunction due to arterial insufficiency: Secondary | ICD-10-CM

## 2022-01-26 ENCOUNTER — Telehealth: Payer: Self-pay | Admitting: *Deleted

## 2022-01-26 NOTE — Telephone Encounter (Signed)
Left message for pt's wife (DPR) to call back to schedule pt's yearly lung screening Ct scan.  ?

## 2022-02-01 ENCOUNTER — Telehealth: Payer: Self-pay

## 2022-02-01 NOTE — Telephone Encounter (Signed)
Patient called to say that he needed documentation for insurance claim. Advised pt to provide information needed to office. ?

## 2022-02-09 ENCOUNTER — Encounter: Payer: Self-pay | Admitting: Internal Medicine

## 2022-02-13 ENCOUNTER — Other Ambulatory Visit: Payer: Self-pay | Admitting: Internal Medicine

## 2022-02-13 DIAGNOSIS — N5201 Erectile dysfunction due to arterial insufficiency: Secondary | ICD-10-CM

## 2022-02-15 ENCOUNTER — Ambulatory Visit
Admission: RE | Admit: 2022-02-15 | Discharge: 2022-02-15 | Disposition: A | Discharge status: Home / Self Care | Payer: Commercial Managed Care - PPO | Source: Ambulatory Visit | Attending: Acute Care | Admitting: Acute Care

## 2022-02-15 DIAGNOSIS — Z87891 Personal history of nicotine dependence: Secondary | ICD-10-CM

## 2022-02-21 ENCOUNTER — Other Ambulatory Visit: Payer: Self-pay

## 2022-02-21 DIAGNOSIS — Z122 Encounter for screening for malignant neoplasm of respiratory organs: Secondary | ICD-10-CM

## 2022-02-21 DIAGNOSIS — Z87891 Personal history of nicotine dependence: Secondary | ICD-10-CM

## 2022-02-28 ENCOUNTER — Other Ambulatory Visit: Payer: Self-pay | Admitting: Internal Medicine

## 2022-02-28 DIAGNOSIS — E119 Type 2 diabetes mellitus without complications: Secondary | ICD-10-CM

## 2022-02-28 DIAGNOSIS — I10 Essential (primary) hypertension: Secondary | ICD-10-CM

## 2022-03-02 ENCOUNTER — Encounter: Payer: Self-pay | Admitting: Internal Medicine

## 2022-03-02 ENCOUNTER — Ambulatory Visit: Payer: Commercial Managed Care - PPO | Admitting: Internal Medicine

## 2022-03-02 VITALS — BP 120/80 | HR 84 | Ht 74.0 in | Wt 271.0 lb

## 2022-03-02 DIAGNOSIS — Z794 Long term (current) use of insulin: Secondary | ICD-10-CM | POA: Diagnosis not present

## 2022-03-02 DIAGNOSIS — R739 Hyperglycemia, unspecified: Secondary | ICD-10-CM

## 2022-03-02 DIAGNOSIS — E119 Type 2 diabetes mellitus without complications: Secondary | ICD-10-CM | POA: Diagnosis not present

## 2022-03-02 LAB — POCT GLYCOSYLATED HEMOGLOBIN (HGB A1C): Hemoglobin A1C: 7 % — AB (ref 4.0–5.6)

## 2022-03-02 NOTE — Progress Notes (Signed)
Name: James Parrish  Age/ Sex: 56 y.o., male   MRN/ DOB: [id number], Feb 25, 1966     PCP: Janith Lima, MD   Reason for Endocrinology Evaluation: Type 2 Diabetes Mellitus  Initial Endocrine Consultative Visit: 07/13/2019    PATIENT IDENTIFIER: [name] is a 56 y.o. male with a past medical history of T2Dm, HTN and Dyslipidemia, CLL (12/2021). The patient has followed with Endocrinology clinic since 07/13/2019 for consultative assistance with management of his diabetes.  DIABETIC HISTORY:  James Parrish was diagnosed with DM prior to 2010, was initially on oral glycemic agents, insulin started in 2013. His hemoglobin A1c has ranged from 7.2% in 2018, peaking at 8.8% in 2019.  On his initial visit to our clinic, his A1c 7.7% . He was on lantus, victoza, metformin and Jardiance. We switched Victoza to Ozempic.    He is a truck driver SUBJECTIVE:   During the last visit (10/31/2021): A1c 6.8% . We continued Ozempic, Lantus, Wilder Glade and  Metformin   Today (03/02/2022): James Parrish is here for a  follow up on diabetes. He is accompanied by his wife James Parrish.  He checks his blood sugars 1 times . The patient has not had hypoglycemic episodes since the last clinic visit.    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, negative for malignancy  He was diagnosed with CLL 12/2021 Energy level is somewhat fluctuates  Denies nausea , vomiting or diarrhea  Denies chest pain or sob  Weight stable       HOME DIABETES REGIMEN:  Ozempic 1.0 mg weekly ( Fridays )  Farxiga 10 mg daily  Metformin 1000 mg Twice daily  Toujeo  34 units daily      DIABETIC COMPLICATIONS: Microvascular complications:    Denies: CKD, retinopathy, neuropathy  Last eye exam: Completed 10/2021   Macrovascular complications:    Denies: CAD, PVD, CVA  METER DOWNLOAD SUMMARY: 5/6-5/19/2023 Fingerstick  Blood Glucose Tests = 10 Average Number Tests/Day = 0.7 Overall Mean FS Glucose = 105 Standard Deviation = 14.8  BG Ranges: Low = 86 High = 138    Hypoglycemic Events/30 Days: BG < 50 = 0 Episodes of symptomatic severe hypoglycemia = 0     HISTORY:  Past Medical History:  Past Medical History:  Diagnosis Date   Complication of anesthesia    hard to wake up    Diabetes mellitus without complication [facility])    ED (erectile dysfunction)    History of COVID-19 04/2019   Hyperlipidemia    Hypertension    Sleep apnea    uses CPAP nightly   Past Surgical History:  Past Surgical History:  Procedure Laterality Date   BRONCHIAL NEEDLE ASPIRATION BIOPSY  05/29/2021   Procedure: BRONCHIAL NEEDLE ASPIRATION BIOPSIES;  Surgeon: Garner Nash, DO;  Location: [place] [facility];  Service: Pulmonary;;   COLONOSCOPY     finger surgery Right    Index   KNEE SURGERY Left    KNEE SURGERY Right    ORIF PATELLA Right    REVERSE SHOULDER ARTHROPLASTY Right 08/31/2020   Procedure: REVERSE SHOULDER ARTHROPLASTY;  Surgeon: Hiram Gash, MD;  Location: [place];  Service: Orthopedics;  Laterality: Right;   SHOULDER ARTHROSCOPY WITH ROTATOR CUFF REPAIR Left 02/04/2020   Procedure: SHOULDER ARTHROSCOPY WITH ROTATOR CUFF REPAIR WITH BICEP TENODESIS;  Surgeon: Hiram Gash, MD;  Location: [place];  Service:  Orthopedics;  Laterality: Left;   VIDEO BRONCHOSCOPY WITH ENDOBRONCHIAL ULTRASOUND Left 05/29/2021   Procedure: VIDEO BRONCHOSCOPY WITH ENDOBRONCHIAL ULTRASOUND;  Surgeon: Garner Nash, DO;  Location: [place];  Service: Pulmonary;  Laterality: Left;   Social History:  reports that he quit smoking about 22 months ago. His smoking use included cigarettes. He has a 37.50 pack-year smoking history. He has never used smokeless tobacco. He reports current alcohol use. He reports that he does not use drugs. Family History:  Family History  Problem Relation Age  of Onset   Cancer Mother        breast cancer    Hypertension Mother    Diabetes Mother    Venous thrombosis Son    Colon cancer Neg Hx    Esophageal cancer Neg Hx    Stomach cancer Neg Hx    Rectal cancer Neg Hx      HOME MEDICATIONS: Allergies as of 03/02/2022   No Known Allergies      Medication List        Accurate as of Mar 02, 2022  2:25 PM. If you have any questions, ask your nurse or doctor.          amLODipine 10 MG tablet Commonly known as: NORVASC Take 1 tablet (10 mg total) by mouth at bedtime.   atorvastatin 10 MG tablet Commonly known as: LIPITOR Take 1 tablet (10 mg total) by mouth daily.   dapagliflozin propanediol 10 MG Tabs tablet Commonly known as: Farxiga Take 1 tablet (10 mg total) by mouth daily before breakfast.   glucose blood test strip Use as instructed to test blood sugar 2 times daily E11.65    Onetouch strips   indapamide 1.25 MG tablet Commonly known as: LOZOL TAKE ONE TABLET BY MOUTH DAILY   losartan 100 MG tablet Commonly known as: COZAAR TAKE ONE TABLET BY MOUTH DAILY   metFORMIN 1000 MG tablet Commonly known as: GLUCOPHAGE Take 1 tablet (1,000 mg total) by mouth 2 (two) times daily with a meal.   multivitamin with minerals Tabs tablet Take 1 tablet by mouth daily.   Nebivolol HCl 20 MG Tabs TAKE ONE TABLET BY MOUTH DAILY   Ozempic (1 MG/DOSE) 4 MG/3ML Sopn Generic drug: Semaglutide (1 MG/DOSE) Inject 1 mg into the skin once a week.   spironolactone 50 MG tablet Commonly known as: ALDACTONE Take 1 tablet (50 mg total) by mouth daily.   tadalafil 20 MG tablet Commonly known as: CIALIS TAKE ONE TABLET BY MOUTH DAILY AS NEEDED FOR ERECTILE DYSFUNCTION   Testosterone 20.25 MG/ACT (1.62%) Gel Place 2 Act onto the skin daily.   Toujeo Max SoloStar 300 UNIT/ML Solostar Pen Generic drug: insulin glargine (2 Unit Dial) Inject 34 Units into the skin daily in the afternoon.         OBJECTIVE:   Vital Signs:  BP 120/80 (BP Location: Left Arm, Patient Position: Sitting, Cuff Size: Large)   Pulse 84   Ht '6\' 2"'$  (1.88 m)   Wt 271 lb (122.9 kg)   SpO2 96%   BMI 34.79 kg/m    Wt Readings from Last 3 Encounters:  03/02/22 271 lb (122.9 kg)  12/14/21 271 lb 2 oz (123 kg)  12/01/21 273 lb (123.8 kg)     Exam: General: Pt appears well and is in NAD  Lungs: Clear with good BS bilat with no rales, rhonchi, or wheezes  Heart: RRR with normal S1 and S2 and no gallops; no murmurs; no rub  Abdomen: Normoactive bowel sounds, soft, nontender, without masses or organomegaly palpable  Extremities: No pretibial edema.   Neuro: MS is good with appropriate affect, pt is alert and Ox3    DM foot exam: 03/02/2022 The skin of the feet is intact without sores or ulcerations. The pedal pulses are 2+ on right and 2+ on left. The sensation is intact to a screening 5.07, 10 gram monofilament bilaterally     DATA REVIEWED:  Lab Results  Component Value Date   HGBA1C 7.0 (A) 03/02/2022   HGBA1C 6.8 (A) 09/22/2021   HGBA1C 6.8 (A) 04/11/2021    Latest Reference Range & Units 12/01/21 15:48  TSH 0.450 - 4.500 uIU/mL 2.130  T4,Free(Direct) 0.82 - 1.77 ng/dL 1.16       Latest Reference Range & Units 09/22/21 11:13  Sodium 135 - 145 mEq/L 140  Potassium 3.5 - 5.1 mEq/L 4.1  Chloride 96 - 112 mEq/L 106  CO2 19 - 32 mEq/L 25  Glucose 70 - 99 mg/dL 105 (H)  BUN 6 - 23 mg/dL 16  Creatinine 0.40 - 1.50 mg/dL 0.93  Calcium 8.4 - 10.5 mg/dL 9.6  GFR >60.00 mL/min 90.99  Total CHOL/HDL Ratio  4  Cholesterol 0 - 200 mg/dL 112  HDL Cholesterol >39.00 mg/dL 29.40 (L)  LDL (calc) 0 - 99 mg/dL 47  MICROALB/CREAT RATIO 0.0 - 30.0 mg/g 0.6  NonHDL  82.24  Triglycerides 0.0 - 149.0 mg/dL 176.0 (H)  VLDL 0.0 - 40.0 mg/dL 35.2    ASSESSMENT / PLAN / RECOMMENDATIONS:   1) Type 2 Diabetes Mellitus, Optimally controlled, Without  complications - Most recent A1c of 7.0 %. Goal A1c < 7.0 %.     - A1c continues  to be optimal - No changes  MEDICATIONS: -Continue Ozempic 1.0 mg weekly  -Continue Farxiga 10 mg daily  mg daily  -Continue Metformin 1000 mg Twice daily  -Continue Toujeo 34 units daily    EDUCATION / INSTRUCTIONS: BG monitoring instructions: Patient is instructed to check his blood sugars 1 time a day. Call [place] [facility] if: BG persistently < 70  I reviewed the Rule of 15 for the treatment of hypoglycemia in detail with the patient. Literature supplied.     4)S/P left lobectomy:  -Pathology report was benign -Patient is clinically euthyroid -TSH normal     F/U in 6 months    Signed electronically by: Mack Guise, MD  [facility]  [address] [place], [place] [address] Phone: [phone] FAX: [phone]   CC: Janith Lima, MD [place] [address] Phone: [phone]  Fax: [phone]  Return to Endocrinology clinic as below: Future Appointments  Date Time Provider [place]  03/16/2022 11:15 AM [facility] None  03/16/2022 11:40 AM Orson Slick, MD [facility] None

## 2022-03-02 NOTE — Patient Instructions (Addendum)
-   Continue Ozempic 1 mg weekly  - Continue Toujeo  34 units daily  - Continue Metformin 1000 mg, 1 tablet twice daily  - Continue Farxiga 10 mg , 1 tablet with Breakfast     HOW TO TREAT LOW BLOOD SUGARS (Blood sugar LESS THAN 70 MG/DL) Please follow the RULE OF 15 for the treatment of hypoglycemia treatment (when your (blood sugars are less than 70 mg/dL)   STEP 1: Take 15 grams of carbohydrates when your blood sugar is low, which includes:  3-4 GLUCOSE TABS  OR 3-4 OZ OF JUICE OR REGULAR SODA OR ONE TUBE OF GLUCOSE GEL    STEP 2: RECHECK blood sugar in 15 MINUTES STEP 3: If your blood sugar is still low at the 15 minute recheck --> then, go back to STEP 1 and treat AGAIN with another 15 grams of carbohydrates.

## 2022-03-05 ENCOUNTER — Encounter: Payer: Self-pay | Admitting: Internal Medicine

## 2022-03-05 ENCOUNTER — Encounter: Payer: Self-pay | Admitting: Hematology and Oncology

## 2022-03-05 ENCOUNTER — Other Ambulatory Visit: Payer: Self-pay | Admitting: Internal Medicine

## 2022-03-08 ENCOUNTER — Encounter: Payer: Self-pay | Admitting: *Deleted

## 2022-03-08 ENCOUNTER — Other Ambulatory Visit: Payer: Self-pay

## 2022-03-08 ENCOUNTER — Inpatient Hospital Stay (HOSPITAL_BASED_OUTPATIENT_CLINIC_OR_DEPARTMENT_OTHER): Payer: Commercial Managed Care - PPO | Admitting: Hematology and Oncology

## 2022-03-08 ENCOUNTER — Encounter: Payer: Self-pay | Admitting: Hematology and Oncology

## 2022-03-08 ENCOUNTER — Telehealth: Payer: Self-pay | Admitting: Hematology and Oncology

## 2022-03-08 ENCOUNTER — Other Ambulatory Visit: Payer: Self-pay | Admitting: Hematology and Oncology

## 2022-03-08 ENCOUNTER — Inpatient Hospital Stay: Payer: Commercial Managed Care - PPO | Attending: Hematology and Oncology

## 2022-03-08 VITALS — BP 140/94 | HR 88 | Temp 97.9°F | Resp 17 | Wt 271.9 lb

## 2022-03-08 DIAGNOSIS — G4733 Obstructive sleep apnea (adult) (pediatric): Secondary | ICD-10-CM | POA: Diagnosis not present

## 2022-03-08 DIAGNOSIS — Z87891 Personal history of nicotine dependence: Secondary | ICD-10-CM | POA: Diagnosis not present

## 2022-03-08 DIAGNOSIS — D72820 Lymphocytosis (symptomatic): Secondary | ICD-10-CM | POA: Diagnosis not present

## 2022-03-08 DIAGNOSIS — Z803 Family history of malignant neoplasm of breast: Secondary | ICD-10-CM | POA: Diagnosis not present

## 2022-03-08 DIAGNOSIS — Z800 Family history of malignant neoplasm of digestive organs: Secondary | ICD-10-CM | POA: Diagnosis not present

## 2022-03-08 DIAGNOSIS — C9110 Chronic lymphocytic leukemia of B-cell type not having achieved remission: Secondary | ICD-10-CM | POA: Insufficient documentation

## 2022-03-08 LAB — LACTATE DEHYDROGENASE: LDH: 134 U/L (ref 98–192)

## 2022-03-08 LAB — CMP (CANCER CENTER ONLY)
ALT: 20 U/L (ref 0–44)
AST: 17 U/L (ref 15–41)
Albumin: 4.6 g/dL (ref 3.5–5.0)
Alkaline Phosphatase: 50 U/L (ref 38–126)
Anion gap: 8 (ref 5–15)
BUN: 17 mg/dL (ref 6–20)
CO2: 25 mmol/L (ref 22–32)
Calcium: 9.2 mg/dL (ref 8.9–10.3)
Chloride: 107 mmol/L (ref 98–111)
Creatinine: 1.09 mg/dL (ref 0.61–1.24)
GFR, Estimated: >60 mL/min (ref >60)
Glucose, Bld: 143 mg/dL — ABNORMAL HIGH (ref 70–99)
Potassium: 3.9 mmol/L (ref 3.5–5.1)
Sodium: 140 mmol/L (ref 135–145)
Total Bilirubin: 0.2 mg/dL — ABNORMAL LOW (ref 0.3–1.2)
Total Protein: 7.2 g/dL (ref 6.5–8.1)

## 2022-03-08 LAB — CBC WITH DIFFERENTIAL (CANCER CENTER ONLY)
Abs Immature Granulocytes: 0.09 10*3/uL — ABNORMAL HIGH (ref 0.00–0.07)
Basophils Absolute: 0.1 10*3/uL (ref 0.0–0.1)
Basophils Relative: 0 %
Eosinophils Absolute: 0.1 10*3/uL (ref 0.0–0.5)
Eosinophils Relative: 0 %
HCT: 45.5 % (ref 39.0–52.0)
Hemoglobin: 14.6 g/dL (ref 13.0–17.0)
Immature Granulocytes: 0 %
Lymphocytes Relative: 76 %
Lymphs Abs: 16.7 10*3/uL — ABNORMAL HIGH (ref 0.7–4.0)
MCH: 24.9 pg — ABNORMAL LOW (ref 26.0–34.0)
MCHC: 32.1 g/dL (ref 30.0–36.0)
MCV: 77.6 fL — ABNORMAL LOW (ref 80.0–100.0)
Monocytes Absolute: 0.9 10*3/uL (ref 0.1–1.0)
Monocytes Relative: 4 %
Neutro Abs: 4.4 10*3/uL (ref 1.7–7.7)
Neutrophils Relative %: 20 %
Platelet Count: 227 10*3/uL (ref 150–400)
RBC: 5.86 MIL/uL — ABNORMAL HIGH (ref 4.22–5.81)
RDW: 15.6 % — ABNORMAL HIGH (ref 11.5–15.5)
WBC Count: 22.2 10*3/uL — ABNORMAL HIGH (ref 4.0–10.5)
nRBC: 0 % (ref 0.0–0.2)

## 2022-03-08 NOTE — Telephone Encounter (Signed)
Per 5/25 los called and left message for pt about appointment in August.  Call back number left

## 2022-03-08 NOTE — Progress Notes (Signed)
[facility] [phone]   [phone]  PROGRESS NOTE  Patient Care Team: Jones, Thomas L, MD as PCP - General (Internal Medicine) Turner, Traci R, MD as PCP - Sleep Medicine (Cardiology)  Hematological/Oncological History # CLL, Rai Stage 0. Prognostic Markers Pending.  01/25/2020: WBC 9.6, Hgb 15.6, MCV 79.1, Plt 241 05/29/2021: WBC 13.3, Hgb 15.4, MCV 76.9, Plt 203 11/16/2021: patient had routine CBC collected, showed WBC 15.9, Hgb 14.8, MCV 76.4, ALC 11.6 12/14/2021: establish care with Dr. Dorsey   Interval History:  Gerritt E Brosky 56 y.o. male with medical history significant for CLL who presents for a follow up visit. The patient's last visit was on 12/14/2021 at which time he established care. In the interim since the last visit he requested an earlier follow up due to neck pain.  On exam today Mr. Pridmore presents earlier than anticipated because he has felt some lymph nodes in his neck.  He reports that otherwise he has been "doing all right".  He notes that the diagnosis is unfortunately put a burden on his mind and he has anxious about the findings.  He reports that he was feeling something on his "left neck" and that he felt 2 small nodules.  He notes that they are nontender and not particularly large.  He has not been having any other symptoms such as fevers, chills, sweats, nausea, vomiting, or diarrhea.  A full 10 point ROS was otherwise negative.  MEDICAL HISTORY:  Past Medical History:  Diagnosis Date   Complication of anesthesia    hard to wake up    Diabetes mellitus without complication (HCC)    ED (erectile dysfunction)    History of COVID-19 04/2019   Hyperlipidemia    Hypertension    Sleep apnea    uses CPAP nightly    SURGICAL HISTORY: Past Surgical History:  Procedure Laterality Date   BRONCHIAL NEEDLE ASPIRATION BIOPSY  05/29/2021   Procedure: BRONCHIAL NEEDLE ASPIRATION BIOPSIES;  Surgeon: Icard, Bradley L, DO;  Location: [facility]  ENDOSCOPY;  Service: Pulmonary;;   COLONOSCOPY     finger surgery Right    Index   KNEE SURGERY Left    KNEE SURGERY Right    ORIF PATELLA Right    REVERSE SHOULDER ARTHROPLASTY Right 08/31/2020   Procedure: REVERSE SHOULDER ARTHROPLASTY;  Surgeon: Varkey, Dax T, MD;  Location: [facility];  Service: Orthopedics;  Laterality: Right;   SHOULDER ARTHROSCOPY WITH ROTATOR CUFF REPAIR Left 02/04/2020   Procedure: SHOULDER ARTHROSCOPY WITH ROTATOR CUFF REPAIR WITH BICEP TENODESIS;  Surgeon: Varkey, Dax T, MD;  Location: [facility];  Service: Orthopedics;  Laterality: Left;   VIDEO BRONCHOSCOPY WITH ENDOBRONCHIAL ULTRASOUND Left 05/29/2021   Procedure: VIDEO BRONCHOSCOPY WITH ENDOBRONCHIAL ULTRASOUND;  Surgeon: Icard, Bradley L, DO;  Location: [facility] ENDOSCOPY;  Service: Pulmonary;  Laterality: Left;    SOCIAL HISTORY: Social History   Socioeconomic History   Marital status: Married    Spouse name: Not on file   Number of children: Not on file   Years of education: Not on file   Highest education level: Not on file  Occupational History   Not on file  Tobacco Use   Smoking status: Former    Packs/day: 1.50    Years: 25.00    Pack years: 37.50    Types: Cigarettes    Quit date: 04/2020    Years since quitting: 1.9   Smokeless tobacco: Never  Vaping Use   Vaping Use: Never   used  Substance and Sexual Activity   Alcohol use: Yes    Comment: occasionally   Drug use: Never   Sexual activity: Yes  Other Topics Concern   Not on file  Social History Narrative   Not on file   Social Determinants of Health   Financial Resource Strain: Not on file  Food Insecurity: Not on file  Transportation Needs: Not on file  Physical Activity: Not on file  Stress: Not on file  Social Connections: Not on file  Intimate Partner Violence: Not on file    FAMILY HISTORY: Family History  Problem Relation Age of Onset   Cancer Mother        breast cancer    Hypertension  Mother    Diabetes Mother    Venous thrombosis Son    Colon cancer Neg Hx    Esophageal cancer Neg Hx    Stomach cancer Neg Hx    Rectal cancer Neg Hx     ALLERGIES:  has No Known Allergies.  MEDICATIONS:  Current Outpatient Medications  Medication Sig Dispense Refill   amLODipine (NORVASC) 10 MG tablet Take 1 tablet (10 mg total) by mouth at bedtime. 90 tablet 0   atorvastatin (LIPITOR) 10 MG tablet Take 1 tablet (10 mg total) by mouth daily. 90 tablet 3   dapagliflozin propanediol (FARXIGA) 10 MG TABS tablet Take 1 tablet (10 mg total) by mouth daily before breakfast. 90 tablet 3   glucose blood test strip Use as instructed to test blood sugar 2 times daily E11.65    Onetouch strips 100 each 12   indapamide (LOZOL) 1.25 MG tablet TAKE ONE TABLET BY MOUTH DAILY 90 tablet 0   insulin glargine, 2 Unit Dial, (TOUJEO MAX SOLOSTAR) 300 UNIT/ML Solostar Pen Inject 34 Units into the skin daily in the afternoon. 15 mL 3   losartan (COZAAR) 100 MG tablet TAKE ONE TABLET BY MOUTH DAILY 90 tablet 0   metFORMIN (GLUCOPHAGE) 1000 MG tablet Take 1 tablet (1,000 mg total) by mouth 2 (two) times daily with a meal. 180 tablet 3   Multiple Vitamin (MULTIVITAMIN WITH MINERALS) TABS tablet Take 1 tablet by mouth daily.     Nebivolol HCl 20 MG TABS TAKE ONE TABLET BY MOUTH DAILY 90 tablet 1   Semaglutide, 1 MG/DOSE, (OZEMPIC, 1 MG/DOSE,) 4 MG/3ML SOPN Inject 1 mg into the skin once a week. 9 mL 3   spironolactone (ALDACTONE) 50 MG tablet Take 1 tablet (50 mg total) by mouth daily. 90 tablet 0   tadalafil (CIALIS) 20 MG tablet TAKE ONE TABLET BY MOUTH DAILY AS NEEDED FOR ERECTILE DYSFUNCTION 10 tablet 1   Testosterone 20.25 MG/ACT (1.62%) GEL Place 2 Act onto the skin daily. 225 g 1   No current facility-administered medications for this visit.    REVIEW OF SYSTEMS:   Constitutional: ( - ) fevers, ( - )  chills , ( - ) night sweats Eyes: ( - ) blurriness of vision, ( - ) double vision, ( - ) watery  eyes Ears, nose, mouth, throat, and face: ( - ) mucositis, ( - ) sore throat Respiratory: ( - ) cough, ( - ) dyspnea, ( - ) wheezes Cardiovascular: ( - ) palpitation, ( - ) chest discomfort, ( - ) lower extremity swelling Gastrointestinal:  ( - ) nausea, ( - ) heartburn, ( - ) change in bowel habits Skin: ( - ) abnormal skin rashes Lymphatics: ( - ) new lymphadenopathy, ( - ) easy bruising   Neurological: ( - ) numbness, ( - ) tingling, ( - ) new weaknesses Behavioral/Psych: ( - ) mood change, ( - ) new changes  All other systems were reviewed with the patient and are negative.  PHYSICAL EXAMINATION: ECOG PERFORMANCE STATUS: 0 - Asymptomatic  Vitals:   03/08/22 0923  BP: (!) 140/94  Pulse: 88  Resp: 17  Temp: 97.9 F (36.6 C)  SpO2: 94%   Filed Weights   03/08/22 0923  Weight: 271 lb 14.4 oz (123.3 kg)    GENERAL: Well-appearing middle-aged African-American male alert, no distress and comfortable SKIN: skin color, texture, turgor are normal, no rashes or significant lesions EYES: conjunctiva are pink and non-injected, sclera clear NECK: supple, non-tender LYMPH: Normal size prominent left-sided cervical lymph nodes, no other overt lymphadenopathy noted on exam LUNGS: clear to auscultation and percussion with normal breathing effort HEART: regular rate & rhythm and no murmurs and no lower extremity edema Musculoskeletal: no cyanosis of digits and no clubbing  PSYCH: alert & oriented x 3, fluent speech NEURO: no focal motor/sensory deficits  LABORATORY DATA:  I have reviewed the data as listed    Latest Ref Rng & Units 03/08/2022    9:10 AM 12/14/2021    2:12 PM 11/16/2021   11:04 AM  CBC  WBC 4.0 - 10.5 K/uL 22.2   17.1   15.9    Hemoglobin 13.0 - 17.0 g/dL 14.6   15.1   14.8    Hematocrit 39.0 - 52.0 % 45.5   47.0   46.1    Platelets 150 - 400 K/uL 227   255   180.0         Latest Ref Rng & Units 03/08/2022    9:10 AM 12/14/2021    2:12 PM 09/22/2021   11:13 AM  CMP   Glucose 70 - 99 mg/dL 143   140   105    BUN 6 - 20 mg/dL 17   18   16    Creatinine 0.61 - 1.24 mg/dL 1.09   1.08   0.93    Sodium 135 - 145 mmol/L 140   143   140    Potassium 3.5 - 5.1 mmol/L 3.9   3.9   4.1    Chloride 98 - 111 mmol/L 107   107   106    CO2 22 - 32 mmol/L 25   29   25    Calcium 8.9 - 10.3 mg/dL 9.2   9.7   9.6    Total Protein 6.5 - 8.1 g/dL 7.2   7.2     Total Bilirubin 0.3 - 1.2 mg/dL 0.2   0.4     Alkaline Phos 38 - 126 U/L 50   52     AST 15 - 41 U/L 17   18     ALT 0 - 44 U/L 20   21       RADIOGRAPHIC STUDIES:  CT CHEST LUNG CA SCREEN LOW DOSE W/O CM  Result Date: 02/16/2022 CLINICAL DATA:  Ex-smoker, quitting 2 years ago. Thirty pack-year history. History of chronic lymphocytic leukemia, recently diagnosed without therapy yet. EXAM: CT CHEST WITHOUT CONTRAST LOW-DOSE FOR LUNG CANCER SCREENING TECHNIQUE: Multidetector CT imaging of the chest was performed following the standard protocol without IV contrast. RADIATION DOSE REDUCTION: This exam was performed according to the departmental dose-optimization program which includes automated exposure control, adjustment of the mA and/or kV according to patient size and/or use of iterative reconstruction technique.   COMPARISON:  02/06/2021 PET.  Lung cancer screening CT 01/04/2021 FINDINGS: Cardiovascular: Aortic atherosclerosis. Borderline cardiomegaly, without pericardial effusion. Mediastinum/Nodes: Suspect left thyroidectomy. Right axillary index node measures 1.9 cm on 10/02 versus 2.1 cm on the prior. The middle mediastinal mass on the prior exam has resolved or been resected. Right paratracheal index node measures 1.1 cm on 23/2 and is similar in size on the prior. Hilar regions poorly evaluated without intravenous contrast. Lungs/Pleura: No pleural fluid. Right-sided pulmonary nodules of maximally volume derived equivalent diameter 2.6 mm. Upper Abdomen: Moderate hepatic steatosis. Colonic stool burden suggests  constipation. Normal imaged portions of the spleen, stomach, pancreas, gallbladder, adrenal glands, kidneys. Index gastrohepatic ligament node measures 1.8 cm on 63/2 versus 1.6 cm on the prior exam (when remeasured). Musculoskeletal: Mild left-sided gynecomastia is not significantly changed. Right shoulder arthroplasty. IMPRESSION: 1. Lung-RADS 2, benign appearance or behavior. Continue annual screening with low-dose chest CT without contrast in 12 months. 2. Relatively similar thoracic and upper abdominal adenopathy. The dominant middle mediastinal mass has either resolved or been resected and most likely was resected as part of an interval left thyroidectomy. 3. Hepatic steatosis. 4. Possible constipation. 5. Aortic Atherosclerosis (ICD10-I70.0). Electronically Signed   By: Kyle  Talbot M.D.   On: 02/16/2022 13:41   ASSESSMENT & PLAN James Parrish 56 y.o. male with medical history significant for CLL who presents for a follow up visit.  Today we discussed the diagnosis of CLL and what to expect moving forward.  We discussed that CLL is an overproduction of lymphocytes and is an incurable condition.  There are numerous treatments available for this condition though they are reserved for 1 certain criteria are met.  We noted that material such as rapidly enlarging lymph nodes, rapidly increasing lymphocyte count, drop in hemoglobin, or drop in platelets would be an indication to start treatment.  The patient does not currently have any of these and therefore does not require treatment this time.  We briefly discussed what treatment options would be if and when that time arise.  The patient voices understanding of this plan moving forward.  Criteria for starting treatment are noted below in literature support  # CLL, Rai Stage 0. Prognostic Markers Pending.  -- Findings at this time most consistent with a CLL Rai stage 0 --No indication for treatment at this time as patient's blood counts are stable and he  is otherwise asymptomatic --Prognostic panel ordered and pending today --Labs show white blood cell count 22.2, hemoglobin 14.6, MCV 77.6, and platelets 227 --Recommend close clinical monitoring with return to clinic in 3 months time  No orders of the defined types were placed in this encounter.   All questions were answered. The patient knows to call the clinic with any problems, questions or concerns.  A total of more than 30 minutes were spent on this encounter with face-to-face time and non-face-to-face time, including preparing to see the patient, ordering tests and/or medications, counseling the patient and coordination of care as outlined above.   John T. Dorsey, MD Department of Hematology/Oncology [facility] Phone: [phone] Pager: [phone] Email: [email or web address]  03/12/2022 9:01 PM  Hallek M, Cheson BD, Catovsky D, Caligaris-Cappio F, Dighiero G, Dhner H, Hillmen P, Keating M, Montserrat E, Chiorazzi N, Stilgenbauer S, Rai KR, Byrd JC, Eichhorst B, O'Brien S, Robak T, Seymour JF, Kipps TJ. iwCLL guidelines for diagnosis, indications for treatment, response assessment, and supportive management of CLL. Blood. 2018 Jun 21;131(25):2745-2760.  Active   disease should be clearly documented to initiate therapy. At least 1 of the following criteria should be met.  1) Evidence of progressive marrow failure as manifested by the development of, or worsening of, anemia and/or thrombocytopenia. Cutoff levels of Hb <10 g/dL or platelet counts <100  109/L are generally regarded as indication for treatment. However, in some patients, platelet counts <100  109/L may remain stable over a long period; this situation does not automatically require therapeutic intervention. 2) Massive (ie, ?6 cm below the left costal margin) or progressive or symptomatic splenomegaly. 3) Massive nodes (ie, ?10 cm in longest diameter) or progressive or  symptomatic lymphadenopathy. 4) Progressive lymphocytosis with an increase of ?50% over a 2-month period, or lymphocyte doubling time (LDT) <6 months. LDT can be obtained by linear regression extrapolation of absolute lymphocyte counts obtained at intervals of 2 weeks over an observation period of 2 to 3 months; patients with initial blood lymphocyte counts <30  109/L may require a longer observation period to determine the LDT. Factors contributing to lymphocytosis other than CLL (eg, infections, steroid administration) should be excluded. 5) Autoimmune complications including anemia or thrombocytopenia poorly responsive to corticosteroids. 6) Symptomatic or functional extranodal involvement (eg, skin, kidney, lung, spine). Disease-related symptoms as defined by any of the following: Unintentional weight loss ?10% within the previous 6 months. Significant fatigue (ie, ECOG performance scale 2 or worse; cannot work or unable to perform usual activities). Fevers ?100.5F or 38.0C for 2 or more weeks without evidence of infection. Night sweats for ?1 month without evidence of infection.   

## 2022-03-15 ENCOUNTER — Ambulatory Visit: Payer: Commercial Managed Care - PPO | Admitting: Internal Medicine

## 2022-03-15 ENCOUNTER — Encounter: Payer: Self-pay | Admitting: Internal Medicine

## 2022-03-15 VITALS — BP 140/80 | HR 87 | Temp 98.5°F | Resp 16 | Ht 74.0 in | Wt 276.0 lb

## 2022-03-15 DIAGNOSIS — Z1159 Encounter for screening for other viral diseases: Secondary | ICD-10-CM

## 2022-03-15 DIAGNOSIS — E291 Testicular hypofunction: Secondary | ICD-10-CM | POA: Diagnosis not present

## 2022-03-15 DIAGNOSIS — I10 Essential (primary) hypertension: Secondary | ICD-10-CM | POA: Diagnosis not present

## 2022-03-15 DIAGNOSIS — Z125 Encounter for screening for malignant neoplasm of prostate: Secondary | ICD-10-CM | POA: Insufficient documentation

## 2022-03-15 DIAGNOSIS — Z23 Encounter for immunization: Secondary | ICD-10-CM | POA: Diagnosis not present

## 2022-03-15 DIAGNOSIS — Z794 Long term (current) use of insulin: Secondary | ICD-10-CM

## 2022-03-15 DIAGNOSIS — Z114 Encounter for screening for human immunodeficiency virus [HIV]: Secondary | ICD-10-CM

## 2022-03-15 DIAGNOSIS — E119 Type 2 diabetes mellitus without complications: Secondary | ICD-10-CM

## 2022-03-15 LAB — FISH,CLL PROGNOSTIC PANEL

## 2022-03-15 LAB — PSA: PSA: 0.36 ng/mL (ref 0.10–4.00)

## 2022-03-15 NOTE — Patient Instructions (Signed)
Hypertension, Adult High blood pressure (hypertension) is when the force of blood pumping through the arteries is too strong. The arteries are the blood vessels that carry blood from the heart throughout the body. Hypertension forces the heart to work harder to pump blood and may cause arteries to become narrow or stiff. Untreated or uncontrolled hypertension can lead to a heart attack, heart failure, a stroke, kidney disease, and other problems. A blood pressure reading consists of a higher number over a lower number. Ideally, your blood pressure should be below 120/80. The first ("top") number is called the systolic pressure. It is a measure of the pressure in your arteries as your heart beats. The second ("bottom") number is called the diastolic pressure. It is a measure of the pressure in your arteries as the heart relaxes. What are the causes? The exact cause of this condition is not known. There are some conditions that result in high blood pressure. What increases the risk? Certain factors may make you more likely to develop high blood pressure. Some of these risk factors are under your control, including: Smoking. Not getting enough exercise or physical activity. Being overweight. Having too much fat, sugar, calories, or salt (sodium) in your diet. Drinking too much alcohol. Other risk factors include: Having a personal history of heart disease, diabetes, high cholesterol, or kidney disease. Stress. Having a family history of high blood pressure and high cholesterol. Having obstructive sleep apnea. Age. The risk increases with age. What are the signs or symptoms? High blood pressure may not cause symptoms. Very high blood pressure (hypertensive crisis) may cause: Headache. Fast or irregular heartbeats (palpitations). Shortness of breath. Nosebleed. Nausea and vomiting. Vision changes. Severe chest pain, dizziness, and seizures. How is this diagnosed? This condition is diagnosed by  measuring your blood pressure while you are seated, with your arm resting on a flat surface, your legs uncrossed, and your feet flat on the floor. The cuff of the blood pressure monitor will be placed directly against the skin of your upper arm at the level of your heart. Blood pressure should be measured at least twice using the same arm. Certain conditions can cause a difference in blood pressure between your right and left arms. If you have a high blood pressure reading during one visit or you have normal blood pressure with other risk factors, you may be asked to: Return on a different day to have your blood pressure checked again. Monitor your blood pressure at home for 1 week or longer. If you are diagnosed with hypertension, you may have other blood or imaging tests to help your health care provider understand your overall risk for other conditions. How is this treated? This condition is treated by making healthy lifestyle changes, such as eating healthy foods, exercising more, and reducing your alcohol intake. You may be referred for counseling on a healthy diet and physical activity. Your health care provider may prescribe medicine if lifestyle changes are not enough to get your blood pressure under control and if: Your systolic blood pressure is above 130. Your diastolic blood pressure is above 80. Your personal target blood pressure may vary depending on your medical conditions, your age, and other factors. Follow these instructions at home: Eating and drinking  Eat a diet that is high in fiber and potassium, and low in sodium, added sugar, and fat. An example of this eating plan is called the DASH diet. DASH stands for Dietary Approaches to Stop Hypertension. To eat this way: Eat   plenty of fresh fruits and vegetables. Try to fill one half of your plate at each meal with fruits and vegetables. Eat whole grains, such as whole-wheat pasta, brown rice, or whole-grain bread. Fill about one  fourth of your plate with whole grains. Eat or drink low-fat dairy products, such as skim milk or low-fat yogurt. Avoid fatty cuts of meat, processed or cured meats, and poultry with skin. Fill about one fourth of your plate with lean proteins, such as fish, chicken without skin, beans, eggs, or tofu. Avoid pre-made and processed foods. These tend to be higher in sodium, added sugar, and fat. Reduce your daily sodium intake. Many people with hypertension should eat less than 1,500 mg of sodium a day. Do not drink alcohol if: Your health care provider tells you not to drink. You are pregnant, may be pregnant, or are planning to become pregnant. If you drink alcohol: Limit how much you have to: 0-1 drink a day for women. 0-2 drinks a day for men. Know how much alcohol is in your drink. In the [place] one drink equals one 12 oz bottle of beer (355 mL), one 5 oz glass of wine (148 mL), or one 1 oz glass of hard liquor (44 mL). Lifestyle  Work with your health care provider to maintain a healthy body weight or to lose weight. Ask what an ideal weight is for you. Get at least 30 minutes of exercise that causes your heart to beat faster (aerobic exercise) most days of the week. Activities may include walking, swimming, or biking. Include exercise to strengthen your muscles (resistance exercise), such as Pilates or lifting weights, as part of your weekly exercise routine. Try to do these types of exercises for 30 minutes at least 3 days a week. Do not use any products that contain nicotine or tobacco. These products include cigarettes, chewing tobacco, and vaping devices, such as e-cigarettes. If you need help quitting, ask your health care provider. Monitor your blood pressure at home as told by your health care provider. Keep all follow-up visits. This is important. Medicines Take over-the-counter and prescription medicines only as told by your health care provider. Follow directions carefully. Blood  pressure medicines must be taken as prescribed. Do not skip doses of blood pressure medicine. Doing this puts you at risk for problems and can make the medicine less effective. Ask your health care provider about side effects or reactions to medicines that you should watch for. Contact a health care provider if you: Think you are having a reaction to a medicine you are taking. Have headaches that keep coming back (recurring). Feel dizzy. Have swelling in your ankles. Have trouble with your vision. Get help right away if you: Develop a severe headache or confusion. Have unusual weakness or numbness. Feel faint. Have severe pain in your chest or abdomen. Vomit repeatedly. Have trouble breathing. These symptoms may be an emergency. Get help right away. Call 911. Do not wait to see if the symptoms will go away. Do not drive yourself to the hospital. Summary Hypertension is when the force of blood pumping through your arteries is too strong. If this condition is not controlled, it may put you at risk for serious complications. Your personal target blood pressure may vary depending on your medical conditions, your age, and other factors. For most people, a normal blood pressure is less than 120/80. Hypertension is treated with lifestyle changes, medicines, or a combination of both. Lifestyle changes include losing weight, eating a healthy,   low-sodium diet, exercising more, and limiting alcohol. This information is not intended to replace advice given to you by your health care provider. Make sure you discuss any questions you have with your health care provider. Document Revised: 08/08/2021 Document Reviewed: 08/08/2021 Elsevier Patient Education  2023 [employer].  

## 2022-03-15 NOTE — Progress Notes (Signed)
Subjective:  Patient ID: James Parrish, male    DOB: 15-Aug-1966  Age: 56 y.o. MRN: [id number]  CC: Hypertension, Diabetes, and Annual Exam   HPI JABIER DEESE presents for a CPX and f/up -  He complains that Cialis is ineffective.  He is active and denies chest pain, shortness of breath, diaphoresis, dizziness, lightheadedness, or edema.  Outpatient Medications Prior to Visit  Medication Sig Dispense Refill   amLODipine (NORVASC) 10 MG tablet Take 1 tablet (10 mg total) by mouth at bedtime. 90 tablet 0   atorvastatin (LIPITOR) 10 MG tablet Take 1 tablet (10 mg total) by mouth daily. 90 tablet 3   dapagliflozin propanediol (FARXIGA) 10 MG TABS tablet Take 1 tablet (10 mg total) by mouth daily before breakfast. 90 tablet 3   glucose blood test strip Use as instructed to test blood sugar 2 times daily E11.65    Onetouch strips 100 each 12   indapamide (LOZOL) 1.25 MG tablet TAKE ONE TABLET BY MOUTH DAILY 90 tablet 0   insulin glargine, 2 Unit Dial, (TOUJEO MAX SOLOSTAR) 300 UNIT/ML Solostar Pen Inject 34 Units into the skin daily in the afternoon. 15 mL 3   losartan (COZAAR) 100 MG tablet TAKE ONE TABLET BY MOUTH DAILY 90 tablet 0   metFORMIN (GLUCOPHAGE) 1000 MG tablet Take 1 tablet (1,000 mg total) by mouth 2 (two) times daily with a meal. 180 tablet 3   Multiple Vitamin (MULTIVITAMIN WITH MINERALS) TABS tablet Take 1 tablet by mouth daily.     Nebivolol HCl 20 MG TABS TAKE ONE TABLET BY MOUTH DAILY 90 tablet 1   Semaglutide, 1 MG/DOSE, (OZEMPIC, 1 MG/DOSE,) 4 MG/3ML SOPN Inject 1 mg into the skin once a week. 9 mL 3   spironolactone (ALDACTONE) 50 MG tablet Take 1 tablet (50 mg total) by mouth daily. 90 tablet 0   tadalafil (CIALIS) 20 MG tablet TAKE ONE TABLET BY MOUTH DAILY AS NEEDED FOR ERECTILE DYSFUNCTION 10 tablet 1   Testosterone 20.25 MG/ACT (1.62%) GEL Place 2 Act onto the skin daily. 225 g 1   No facility-administered medications prior to visit.    ROS Review of Systems   Constitutional:  Negative for diaphoresis, fatigue and unexpected weight change.  HENT: Negative.    Eyes: Negative.   Respiratory:  Negative for cough, chest tightness, shortness of breath and wheezing.   Cardiovascular:  Negative for chest pain, palpitations and leg swelling.  Gastrointestinal:  Negative for abdominal pain, constipation, diarrhea, nausea and vomiting.  Endocrine: Negative.   Genitourinary: Negative.  Negative for difficulty urinating, scrotal swelling and testicular pain.       ++ low libido and ED  Musculoskeletal: Negative.   Skin: Negative.   Neurological: Negative.  Negative for dizziness, weakness and light-headedness.  Hematological:  Negative for adenopathy. Does not bruise/bleed easily.   Objective:  BP 140/80 (BP Location: Right Arm, Patient Position: Sitting, Cuff Size: Large)   Pulse 87   Temp 98.5 F (36.9 C) (Oral)   Resp 16   Ht '6\' 2"'$  (1.88 m)   Wt 276 lb (125.2 kg)   SpO2 98%   BMI 35.44 kg/m   BP Readings from Last 3 Encounters:  03/15/22 140/80  03/08/22 (!) 140/94  03/02/22 120/80    Wt Readings from Last 3 Encounters:  03/15/22 276 lb (125.2 kg)  03/08/22 271 lb 14.4 oz (123.3 kg)  03/02/22 271 lb (122.9 kg)    Physical Exam Vitals reviewed.  HENT:  Nose: Nose normal.     Mouth/Throat:     Mouth: Mucous membranes are moist.  Eyes:     General: No scleral icterus.    Conjunctiva/sclera: Conjunctivae normal.  Cardiovascular:     Rate and Rhythm: Normal rate and regular rhythm.     Heart sounds: Normal heart sounds, S1 normal and S2 normal. No murmur heard.    Comments: EKG - NSR, 83 bpm TWI in lateral leads is old J point elevation in anterior leads is new ?LAE No LVH or q waves Pulmonary:     Effort: Pulmonary effort is normal.     Breath sounds: No stridor. No wheezing, rhonchi or rales.  Abdominal:     General: Abdomen is protuberant. Bowel sounds are normal. There is no distension.     Palpations: Abdomen is  soft. There is no hepatomegaly, splenomegaly or mass.     Tenderness: There is no abdominal tenderness.  Musculoskeletal:     Cervical back: Neck supple.     Right lower leg: No edema.     Left lower leg: No edema.  Lymphadenopathy:     Cervical: No cervical adenopathy.  Skin:    General: Skin is warm and dry.  Neurological:     General: No focal deficit present.     Mental Status: He is alert.  Psychiatric:        Mood and Affect: Mood normal.        Behavior: Behavior normal.    Lab Results  Component Value Date   WBC 22.2 (H) 03/08/2022   HGB 14.6 03/08/2022   HCT 45.5 03/08/2022   PLT 227 03/08/2022   GLUCOSE 143 (H) 03/08/2022   CHOL 112 09/22/2021   TRIG 176.0 (H) 09/22/2021   HDL 29.40 (L) 09/22/2021   LDLCALC 47 09/22/2021   ALT 20 03/08/2022   AST 17 03/08/2022   NA 140 03/08/2022   K 3.9 03/08/2022   CL 107 03/08/2022   CREATININE 1.09 03/08/2022   BUN 17 03/08/2022   CO2 25 03/08/2022   TSH 2.130 12/01/2021   PSA 0.36 03/15/2022   HGBA1C 7.0 (A) 03/02/2022   MICROALBUR <0.7 09/22/2021    CT CHEST LUNG CA SCREEN LOW DOSE W/O CM  Result Date: 02/16/2022 CLINICAL DATA:  Ex-smoker, quitting 2 years ago. Thirty pack-year history. History of chronic lymphocytic leukemia, recently diagnosed without therapy yet. EXAM: CT CHEST WITHOUT CONTRAST LOW-DOSE FOR LUNG CANCER SCREENING TECHNIQUE: Multidetector CT imaging of the chest was performed following the standard protocol without IV contrast. RADIATION DOSE REDUCTION: This exam was performed according to the departmental dose-optimization program which includes automated exposure control, adjustment of the mA and/or kV according to patient size and/or use of iterative reconstruction technique. COMPARISON:  02/06/2021 PET.  Lung cancer screening CT 01/04/2021 FINDINGS: Cardiovascular: Aortic atherosclerosis. Borderline cardiomegaly, without pericardial effusion. Mediastinum/Nodes: Suspect left thyroidectomy. Right  axillary index node measures 1.9 cm on 10/02 versus 2.1 cm on the prior. The middle mediastinal mass on the prior exam has resolved or been resected. Right paratracheal index node measures 1.1 cm on 23/2 and is similar in size on the prior. Hilar regions poorly evaluated without intravenous contrast. Lungs/Pleura: No pleural fluid. Right-sided pulmonary nodules of maximally volume derived equivalent diameter 2.6 mm. Upper Abdomen: Moderate hepatic steatosis. Colonic stool burden suggests constipation. Normal imaged portions of the spleen, stomach, pancreas, gallbladder, adrenal glands, kidneys. Index gastrohepatic ligament node measures 1.8 cm on 63/2 versus 1.6 cm on the prior exam (when remeasured).  Musculoskeletal: Mild left-sided gynecomastia is not significantly changed. Right shoulder arthroplasty. IMPRESSION: 1. Lung-RADS 2, benign appearance or behavior. Continue annual screening with low-dose chest CT without contrast in 12 months. 2. Relatively similar thoracic and upper abdominal adenopathy. The dominant middle mediastinal mass has either resolved or been resected and most likely was resected as part of an interval left thyroidectomy. 3. Hepatic steatosis. 4. Possible constipation. 5. Aortic Atherosclerosis (ICD10-I70.0). Electronically Signed   By: Abigail Miyamoto M.D.   On: 02/16/2022 13:41   Assessment & Plan:   Becket was seen today for hypertension, diabetes and annual exam.  Diagnoses and all orders for this visit:  Screening for HIV (human immunodeficiency virus) -     HIV Antibody (routine testing w rflx); Future -     HIV Antibody (routine testing w rflx)  Need for hepatitis C screening test -     Hepatitis C antibody; Future -     Hepatitis C antibody  Primary hypertension- His BP is well controlled. -     EKG 12-Lead  Type 2 diabetes mellitus without complication, with long-term current use of insulin ([place])- His blood sugar is well controlled.  Hypogonadism male -      Testosterone Total,Free,Bio, Males; Future -     Testosterone Total,Free,Bio, Males -     XYOSTED 75 MG/0.5ML SOAJ; Inject 75 mg into the skin once a week.  Screening for prostate cancer -     PSA; Future -     PSA  Other orders -     Varicella-zoster vaccine IM (Shingrix)   I have discontinued Fabio Asa. Thissen's Testosterone. I am also having him start on Xyosted. Additionally, I am having him maintain his glucose blood, multivitamin with minerals, atorvastatin, Ozempic (1 MG/DOSE), metFORMIN, dapagliflozin propanediol, Toujeo Max SoloStar, amLODipine, spironolactone, indapamide, Nebivolol HCl, tadalafil, and losartan.  Meds ordered this encounter  Medications   XYOSTED 75 MG/0.5ML SOAJ    Sig: Inject 75 mg into the skin once a week.    Dispense:  6 mL    Refill:  0     Follow-up: Return in about 6 months (around 09/14/2022).  Scarlette Calico, MD

## 2022-03-16 ENCOUNTER — Other Ambulatory Visit: Payer: Commercial Managed Care - PPO

## 2022-03-16 ENCOUNTER — Ambulatory Visit: Payer: Commercial Managed Care - PPO | Admitting: Hematology and Oncology

## 2022-03-16 ENCOUNTER — Encounter: Payer: Self-pay | Admitting: Internal Medicine

## 2022-03-16 LAB — TESTOSTERONE TOTAL,FREE,BIO, MALES
Albumin: 4.6 g/dL (ref 3.6–5.1)
Sex Hormone Binding: 14 nmol/L — ABNORMAL LOW (ref 22–77)
Testosterone, Bioavailable: 119.2 ng/dL (ref 110.0–575.0)
Testosterone, Free: 56.8 pg/mL (ref 46.0–224.0)
Testosterone: 252 ng/dL (ref 250–827)

## 2022-03-16 LAB — IGVH SOMATIC HYPERMUTATION

## 2022-03-16 LAB — HEPATITIS C ANTIBODY
Hepatitis C Ab: NONREACTIVE
SIGNAL TO CUT-OFF: 0.16 (ref <1.00)

## 2022-03-16 LAB — HIV ANTIBODY (ROUTINE TESTING W REFLEX): HIV 1&2 Ab, 4th Generation: NONREACTIVE

## 2022-03-21 MED ORDER — XYOSTED 75 MG/0.5ML ~~LOC~~ SOAJ: 75.0000 mg | SUBCUTANEOUS | 0 refills | Status: DC

## 2022-03-22 LAB — ZAP-70 PANEL (ASR)

## 2022-03-24 ENCOUNTER — Other Ambulatory Visit: Payer: Self-pay | Admitting: Internal Medicine

## 2022-03-24 DIAGNOSIS — I10 Essential (primary) hypertension: Secondary | ICD-10-CM

## 2022-03-28 ENCOUNTER — Other Ambulatory Visit: Payer: Self-pay | Admitting: Internal Medicine

## 2022-03-29 ENCOUNTER — Other Ambulatory Visit: Payer: Self-pay | Admitting: Internal Medicine

## 2022-03-29 DIAGNOSIS — I10 Essential (primary) hypertension: Secondary | ICD-10-CM

## 2022-03-30 ENCOUNTER — Telehealth: Payer: Self-pay | Admitting: Internal Medicine

## 2022-03-30 MED ORDER — SPIRONOLACTONE 50 MG PO TABS: 50.0000 mg | ORAL_TABLET | Freq: Every day | ORAL | 0 refills | Status: DC

## 2022-03-30 NOTE — Telephone Encounter (Signed)
James Parrish request last office notes with labs after receiving PA form  Faxed office note from 03/15/2022  [facility]

## 2022-04-01 ENCOUNTER — Other Ambulatory Visit: Payer: Self-pay | Admitting: Internal Medicine

## 2022-04-01 DIAGNOSIS — I10 Essential (primary) hypertension: Secondary | ICD-10-CM

## 2022-04-07 ENCOUNTER — Other Ambulatory Visit: Payer: Self-pay | Admitting: Internal Medicine

## 2022-04-07 DIAGNOSIS — I10 Essential (primary) hypertension: Secondary | ICD-10-CM

## 2022-04-08 ENCOUNTER — Other Ambulatory Visit: Payer: Self-pay | Admitting: Internal Medicine

## 2022-04-12 ENCOUNTER — Encounter: Payer: Self-pay | Admitting: Internal Medicine

## 2022-04-12 ENCOUNTER — Other Ambulatory Visit: Payer: Self-pay | Admitting: Internal Medicine

## 2022-04-18 ENCOUNTER — Other Ambulatory Visit: Payer: Self-pay | Admitting: Internal Medicine

## 2022-04-18 DIAGNOSIS — I10 Essential (primary) hypertension: Secondary | ICD-10-CM

## 2022-04-18 MED ORDER — INDAPAMIDE 1.25 MG PO TABS: 1.2500 mg | ORAL_TABLET | Freq: Every day | ORAL | 0 refills | Status: DC

## 2022-04-24 ENCOUNTER — Encounter: Payer: Self-pay | Admitting: Hematology and Oncology

## 2022-04-25 ENCOUNTER — Encounter: Payer: Self-pay | Admitting: Internal Medicine

## 2022-04-25 ENCOUNTER — Other Ambulatory Visit: Payer: Self-pay | Admitting: Internal Medicine

## 2022-04-25 DIAGNOSIS — N5201 Erectile dysfunction due to arterial insufficiency: Secondary | ICD-10-CM

## 2022-04-25 MED ORDER — TADALAFIL 20 MG PO TABS: ORAL_TABLET | ORAL | 2 refills | Status: DC

## 2022-04-27 ENCOUNTER — Telehealth: Payer: Self-pay | Admitting: *Deleted

## 2022-04-27 NOTE — Telephone Encounter (Signed)
  Attempted call to patient. No answer. Left vm message for pt to return call at his convenience to [phone]  Call related to questions related to lab work up: Per Dr. Lorenso Courier: "He had prognostic labs ordered which will matter if/when we need to start treatment for CLL. The plan is currently for observation until treatment is needed. "

## 2022-05-27 ENCOUNTER — Encounter: Payer: Self-pay | Admitting: Internal Medicine

## 2022-05-28 ENCOUNTER — Other Ambulatory Visit: Payer: Self-pay | Admitting: Internal Medicine

## 2022-05-28 ENCOUNTER — Other Ambulatory Visit: Payer: Self-pay

## 2022-05-28 DIAGNOSIS — E119 Type 2 diabetes mellitus without complications: Secondary | ICD-10-CM

## 2022-05-28 DIAGNOSIS — I10 Essential (primary) hypertension: Secondary | ICD-10-CM

## 2022-05-28 MED ORDER — OZEMPIC (1 MG/DOSE) 4 MG/3ML ~~LOC~~ SOPN
1.0000 mg | PEN_INJECTOR | SUBCUTANEOUS | 3 refills | Status: DC
Start: 2022-05-28 — End: 2022-12-05

## 2022-06-08 ENCOUNTER — Ambulatory Visit: Payer: Commercial Managed Care - PPO | Admitting: Hematology and Oncology

## 2022-06-08 ENCOUNTER — Inpatient Hospital Stay: Payer: Commercial Managed Care - PPO | Attending: Hematology and Oncology

## 2022-06-08 ENCOUNTER — Inpatient Hospital Stay (HOSPITAL_BASED_OUTPATIENT_CLINIC_OR_DEPARTMENT_OTHER): Payer: Commercial Managed Care - PPO | Admitting: Hematology and Oncology

## 2022-06-08 ENCOUNTER — Other Ambulatory Visit: Payer: Commercial Managed Care - PPO

## 2022-06-08 ENCOUNTER — Other Ambulatory Visit: Payer: Self-pay | Admitting: Hematology and Oncology

## 2022-06-08 ENCOUNTER — Other Ambulatory Visit: Payer: Self-pay

## 2022-06-08 VITALS — BP 147/107 | HR 76 | Temp 97.7°F | Resp 18 | Ht 74.0 in | Wt 271.2 lb

## 2022-06-08 DIAGNOSIS — E119 Type 2 diabetes mellitus without complications: Secondary | ICD-10-CM | POA: Diagnosis not present

## 2022-06-08 DIAGNOSIS — C9110 Chronic lymphocytic leukemia of B-cell type not having achieved remission: Secondary | ICD-10-CM | POA: Insufficient documentation

## 2022-06-08 DIAGNOSIS — D72820 Lymphocytosis (symptomatic): Secondary | ICD-10-CM | POA: Diagnosis not present

## 2022-06-08 DIAGNOSIS — Z794 Long term (current) use of insulin: Secondary | ICD-10-CM | POA: Diagnosis not present

## 2022-06-08 DIAGNOSIS — Z87891 Personal history of nicotine dependence: Secondary | ICD-10-CM | POA: Diagnosis not present

## 2022-06-08 DIAGNOSIS — Z79899 Other long term (current) drug therapy: Secondary | ICD-10-CM | POA: Insufficient documentation

## 2022-06-08 DIAGNOSIS — G4730 Sleep apnea, unspecified: Secondary | ICD-10-CM | POA: Insufficient documentation

## 2022-06-08 DIAGNOSIS — Z7984 Long term (current) use of oral hypoglycemic drugs: Secondary | ICD-10-CM | POA: Insufficient documentation

## 2022-06-08 DIAGNOSIS — E785 Hyperlipidemia, unspecified: Secondary | ICD-10-CM | POA: Insufficient documentation

## 2022-06-08 DIAGNOSIS — I10 Essential (primary) hypertension: Secondary | ICD-10-CM | POA: Diagnosis not present

## 2022-06-08 LAB — CBC WITH DIFFERENTIAL (CANCER CENTER ONLY)
Abs Immature Granulocytes: 0.08 10*3/uL — ABNORMAL HIGH (ref 0.00–0.07)
Basophils Absolute: 0.1 10*3/uL (ref 0.0–0.1)
Basophils Relative: 1 %
Eosinophils Absolute: 0.1 10*3/uL (ref 0.0–0.5)
Eosinophils Relative: 0 %
HCT: 47.7 % (ref 39.0–52.0)
Hemoglobin: 15.7 g/dL (ref 13.0–17.0)
Immature Granulocytes: 0 %
Lymphocytes Relative: 79 %
Lymphs Abs: 22.5 10*3/uL — ABNORMAL HIGH (ref 0.7–4.0)
MCH: 25.4 pg — ABNORMAL LOW (ref 26.0–34.0)
MCHC: 32.9 g/dL (ref 30.0–36.0)
MCV: 77.3 fL — ABNORMAL LOW (ref 80.0–100.0)
Monocytes Absolute: 0.8 10*3/uL (ref 0.1–1.0)
Monocytes Relative: 3 %
Neutro Abs: 4.7 10*3/uL (ref 1.7–7.7)
Neutrophils Relative %: 17 %
Platelet Count: 214 10*3/uL (ref 150–400)
RBC: 6.17 MIL/uL — ABNORMAL HIGH (ref 4.22–5.81)
RDW: 15.2 % (ref 11.5–15.5)
Smear Review: NORMAL
WBC Count: 28.4 10*3/uL — ABNORMAL HIGH (ref 4.0–10.5)
nRBC: 0 % (ref 0.0–0.2)

## 2022-06-08 LAB — CMP (CANCER CENTER ONLY)
ALT: 20 U/L (ref 0–44)
AST: 17 U/L (ref 15–41)
Albumin: 4.8 g/dL (ref 3.5–5.0)
Alkaline Phosphatase: 57 U/L (ref 38–126)
Anion gap: 6 (ref 5–15)
BUN: 17 mg/dL (ref 6–20)
CO2: 29 mmol/L (ref 22–32)
Calcium: 10 mg/dL (ref 8.9–10.3)
Chloride: 107 mmol/L (ref 98–111)
Creatinine: 0.88 mg/dL (ref 0.61–1.24)
GFR, Estimated: >60 mL/min (ref >60)
Glucose, Bld: 124 mg/dL — ABNORMAL HIGH (ref 70–99)
Potassium: 4.2 mmol/L (ref 3.5–5.1)
Sodium: 142 mmol/L (ref 135–145)
Total Bilirubin: 0.3 mg/dL (ref 0.3–1.2)
Total Protein: 7.3 g/dL (ref 6.5–8.1)

## 2022-06-08 LAB — LACTATE DEHYDROGENASE: LDH: 130 U/L (ref 98–192)

## 2022-06-08 NOTE — Progress Notes (Signed)
[facility] [phone]   [phone]  PROGRESS NOTE  Patient Care Team: Janith Lima, MD as PCP - General (Internal Medicine) Sueanne Margarita, MD as PCP - Sleep Medicine (Cardiology)  Hematological/Oncological History # CLL, Rai Stage 0. Prognostic Markers Pending.  01/25/2020: WBC 9.6, Hgb 15.6, MCV 79.1, Plt 241 05/29/2021: WBC 13.3, Hgb 15.4, MCV 76.9, Plt 203 11/16/2021: patient had routine CBC collected, showed WBC 15.9, Hgb 14.8, MCV 76.4, ALC 11.6 12/14/2021: establish care with Dr. Lorenso Courier   Interval History:  James Parrish 56 y.o. male with medical history significant for CLL who presents for a follow up visit. The patient's last visit was on 03/08/2022. In the interim since the last visit he had prognostic panel return with del 11.   On exam today James Parrish reports that sometimes he can feel enlarged lymph nodes in the back of his neck.  He also reports he does have some occasional pain in his head.  He has full range of motion and the pain does not prevent him from doing his day-to-day activities.  Additionally he has had a steady weight and a strong appetite.  He reports that he is not having any B symptoms at this time.  He has not been having any other symptoms such as fevers, chills, sweats, nausea, vomiting, or diarrhea.  A full 10 point ROS was otherwise negative.  MEDICAL HISTORY:  Past Medical History:  Diagnosis Date   Complication of anesthesia    hard to wake up    Diabetes mellitus without complication [facility])    ED (erectile dysfunction)    History of COVID-19 04/2019   Hyperlipidemia    Hypertension    Sleep apnea    uses CPAP nightly    SURGICAL HISTORY: Past Surgical History:  Procedure Laterality Date   BRONCHIAL NEEDLE ASPIRATION BIOPSY  05/29/2021   Procedure: BRONCHIAL NEEDLE ASPIRATION BIOPSIES;  Surgeon: Garner Nash, DO;  Location: [facility];  Service: Pulmonary;;   COLONOSCOPY     finger surgery Right     Index   KNEE SURGERY Left    KNEE SURGERY Right    ORIF PATELLA Right    REVERSE SHOULDER ARTHROPLASTY Right 08/31/2020   Procedure: REVERSE SHOULDER ARTHROPLASTY;  Surgeon: Hiram Gash, MD;  Location: [facility];  Service: Orthopedics;  Laterality: Right;   SHOULDER ARTHROSCOPY WITH ROTATOR CUFF REPAIR Left 02/04/2020   Procedure: SHOULDER ARTHROSCOPY WITH ROTATOR CUFF REPAIR WITH BICEP TENODESIS;  Surgeon: Hiram Gash, MD;  Location: [facility];  Service: Orthopedics;  Laterality: Left;   VIDEO BRONCHOSCOPY WITH ENDOBRONCHIAL ULTRASOUND Left 05/29/2021   Procedure: VIDEO BRONCHOSCOPY WITH ENDOBRONCHIAL ULTRASOUND;  Surgeon: Garner Nash, DO;  Location: [facility];  Service: Pulmonary;  Laterality: Left;    SOCIAL HISTORY: Social History   Socioeconomic History   Marital status: Married    Spouse name: Not on file   Number of children: Not on file   Years of education: Not on file   Highest education level: Not on file  Occupational History   Not on file  Tobacco Use   Smoking status: Former    Packs/day: 1.50    Years: 25.00    Total pack years: 37.50    Types: Cigarettes    Quit date: 04/2020    Years since quitting: 2.1    Passive exposure: Past   Smokeless tobacco: Never  Vaping Use   Vaping Use: Never used  Substance  and Sexual Activity   Alcohol use: Not Currently    Comment: occasionally   Drug use: Never   Sexual activity: Yes    Partners: Female  Other Topics Concern   Not on file  Social History Narrative   Not on file   Social Determinants of Health   Financial Resource Strain: Not on file  Food Insecurity: Not on file  Transportation Needs: Not on file  Physical Activity: Not on file  Stress: Not on file  Social Connections: Not on file  Intimate Partner Violence: Not on file    FAMILY HISTORY: Family History  Problem Relation Age of Onset   Cancer Mother        breast cancer    Hypertension Mother     Diabetes Mother    Venous thrombosis Son    Colon cancer Neg Hx    Esophageal cancer Neg Hx    Stomach cancer Neg Hx    Rectal cancer Neg Hx     ALLERGIES:  has No Known Allergies.  MEDICATIONS:  Current Outpatient Medications  Medication Sig Dispense Refill   amLODipine (NORVASC) 10 MG tablet TAKE ONE TABLET BY MOUTH AT BEDTIME 90 tablet 0   atorvastatin (LIPITOR) 10 MG tablet Take 1 tablet (10 mg total) by mouth daily. 90 tablet 3   dapagliflozin propanediol (FARXIGA) 10 MG TABS tablet Take 1 tablet (10 mg total) by mouth daily before breakfast. 90 tablet 3   glucose blood test strip Use as instructed to test blood sugar 2 times daily E11.65    Onetouch strips 100 each 12   indapamide (LOZOL) 1.25 MG tablet Take 1 tablet (1.25 mg total) by mouth daily. 90 tablet 0   insulin glargine, 2 Unit Dial, (TOUJEO MAX SOLOSTAR) 300 UNIT/ML Solostar Pen Inject 34 Units into the skin daily in the afternoon. 15 mL 3   losartan (COZAAR) 100 MG tablet TAKE 1 TABLET BY MOUTH DAILY 90 tablet 0   metFORMIN (GLUCOPHAGE) 1000 MG tablet Take 1 tablet (1,000 mg total) by mouth 2 (two) times daily with a meal. 180 tablet 3   Multiple Vitamin (MULTIVITAMIN WITH MINERALS) TABS tablet Take 1 tablet by mouth daily.     Nebivolol HCl 20 MG TABS TAKE ONE TABLET BY MOUTH DAILY 90 tablet 1   Semaglutide, 1 MG/DOSE, (OZEMPIC, 1 MG/DOSE,) 4 MG/3ML SOPN Inject 1 mg into the skin once a week. 9 mL 3   spironolactone (ALDACTONE) 50 MG tablet Take 1 tablet (50 mg total) by mouth daily. 90 tablet 0   tadalafil (CIALIS) 20 MG tablet TAKE ONE TABLET BY MOUTH DAILY AS NEEDED FOR ERECTILE DYSFUNCTION 10 tablet 2   XYOSTED 75 MG/0.5ML SOAJ Inject 75 mg into the skin once a week. 6 mL 0   No current facility-administered medications for this visit.    REVIEW OF SYSTEMS:   Constitutional: ( - ) fevers, ( - )  chills , ( - ) night sweats Eyes: ( - ) blurriness of vision, ( - ) double vision, ( - ) watery eyes Ears, nose,  mouth, throat, and face: ( - ) mucositis, ( - ) sore throat Respiratory: ( - ) cough, ( - ) dyspnea, ( - ) wheezes Cardiovascular: ( - ) palpitation, ( - ) chest discomfort, ( - ) lower extremity swelling Gastrointestinal:  ( - ) nausea, ( - ) heartburn, ( - ) change in bowel habits Skin: ( - ) abnormal skin rashes Lymphatics: ( - ) new lymphadenopathy, ( - )  easy bruising Neurological: ( - ) numbness, ( - ) tingling, ( - ) new weaknesses Behavioral/Psych: ( - ) mood change, ( - ) new changes  All other systems were reviewed with the patient and are negative.  PHYSICAL EXAMINATION: ECOG PERFORMANCE STATUS: 0 - Asymptomatic  Vitals:   06/08/22 1145 06/08/22 1146  BP: (!) 150/105 (!) 147/107  Pulse: 76   Resp: 18   Temp: 97.7 F (36.5 C)   SpO2: 100%    Filed Weights   06/08/22 1145  Weight: 271 lb 3.2 oz (123 kg)    GENERAL: Well-appearing middle-aged African-American male alert, no distress and comfortable SKIN: skin color, texture, turgor are normal, no rashes or significant lesions EYES: conjunctiva are pink and non-injected, sclera clear NECK: supple, non-tender LYMPH: Normal size prominent left-sided cervical lymph nodes, no other overt lymphadenopathy noted on exam LUNGS: clear to auscultation and percussion with normal breathing effort HEART: regular rate & rhythm and no murmurs and no lower extremity edema Musculoskeletal: no cyanosis of digits and no clubbing  PSYCH: alert & oriented x 3, fluent speech NEURO: no focal motor/sensory deficits  LABORATORY DATA:  I have reviewed the data as listed    Latest Ref Rng & Units 06/08/2022   10:43 AM 03/08/2022    9:10 AM 12/14/2021    2:12 PM  CBC  WBC 4.0 - 10.5 K/uL 28.4  22.2  17.1   Hemoglobin 13.0 - 17.0 g/dL 15.7  14.6  15.1   Hematocrit 39.0 - 52.0 % 47.7  45.5  47.0   Platelets 150 - 400 K/uL 214  227  255        Latest Ref Rng & Units 06/08/2022   10:43 AM 03/08/2022    9:10 AM 12/14/2021    2:12 PM  CMP   Glucose 70 - 99 mg/dL 124  143  140   BUN 6 - 20 mg/dL _0 Creatinine 0.61 - 1.24 mg/dL 0.88  1.09  1.08   Sodium 135 - 145 mmol/L 142  140  143   Potassium 3.5 - 5.1 mmol/L 4.2  3.9  3.9   Chloride 98 - 111 mmol/L 107  107  107   CO2 22 - 32 mmol/L _1 Calcium 8.9 - 10.3 mg/dL 10.0  9.2  9.7   Total Protein 6.5 - 8.1 g/dL 7.3  7.2  7.2   Total Bilirubin 0.3 - 1.2 mg/dL 0.3  0.2  0.4   Alkaline Phos 38 - 126 U/L 57  50  52   AST 15 - 41 U/L _2 ALT 0 - 44 U/L _3 RADIOGRAPHIC STUDIES:  No results found.  ASSESSMENT & PLAN James Parrish 56 y.o. male with medical history significant for CLL who presents for a follow up visit.  Previously we discussed the diagnosis of CLL and what to expect moving forward.  We discussed that CLL is an overproduction of lymphocytes and is an incurable condition.  There are numerous treatments available for this condition though they are reserved for 1 certain criteria are met.  We noted that material such as rapidly enlarging lymph nodes, rapidly increasing lymphocyte count, drop in hemoglobin, or drop in platelets would be an indication to start treatment.  The patient does not currently have any of these and therefore does not require treatment this time.  We briefly discussed  what treatment options would be if and when that time arise.  The patient voices understanding of this plan moving forward.  Criteria for starting treatment are noted below in literature support  # CLL, Rai Stage 0 -- Findings at this time most consistent with a CLL Rai stage 0 --No indication for treatment at this time as patient's blood counts are stable and he is otherwise asymptomatic --Prognostic panel showed del 11 predominately (small population of deletion 13 noted as well). IgVH negative, ZAP 70 positive  --Labs show white blood cell count 28.4, hemoglobin 15.7, MCV 77.3, and platelets 214 --RTC in 6 months time to continue  monitoring.   No orders of the defined types were placed in this encounter.   All questions were answered. The patient knows to call the clinic with any problems, questions or concerns.  A total of more than 25 minutes were spent on this encounter with face-to-face time and non-face-to-face time, including preparing to see the patient, ordering tests and/or medications, counseling the patient and coordination of care as outlined above.   Ledell Peoples, MD Department of Hematology/Oncology [place] [facility] Phone: [phone] Pager: [phone] Email: [email or web address]  06/17/2022 8:11 PM  Kristian Covey BD, Catovsky D, Caligaris-Cappio F, Dighiero G, Dhner H, [place], [place], [place] E, Chiorazzi N, [place], Rai KR, [place], Eichhorst B, O'Brien S, [place] T, Seymour JF, Kipps TJ. iwCLL guidelines for diagnosis, indications for treatment, response assessment, and supportive management of CLL. Blood. 2018 Jun 21;131(25):2745-2760.  Active disease should be clearly documented to initiate therapy. At least 1 of the following criteria should be met.  1) Evidence of progressive marrow failure as manifested by the development of, or worsening of, anemia and/or thrombocytopenia. Cutoff levels of Hb <10 g/dL or platelet counts <100  109/L are generally regarded as indication for treatment. However, in some patients, platelet counts <100  109/L may remain stable over a long period; this situation does not automatically require therapeutic intervention. 2) Massive (ie, ?6 cm below the left costal margin) or progressive or symptomatic splenomegaly. 3) Massive nodes (ie, ?10 cm in longest diameter) or progressive or symptomatic lymphadenopathy. 4) Progressive lymphocytosis with an increase of ?50% over a 24-monthperiod, or lymphocyte doubling time (LDT) <6 months. LDT can be obtained by linear regression extrapolation of absolute lymphocyte  counts obtained at intervals of 2 weeks over an observation period of 2 to 3 months; patients with initial blood lymphocyte counts <30  109/L may require a longer observation period to determine the LDT. Factors contributing to lymphocytosis other than CLL (eg, infections, steroid administration) should be excluded. 5) Autoimmune complications including anemia or thrombocytopenia poorly responsive to corticosteroids. 6) Symptomatic or functional extranodal involvement (eg, skin, kidney, lung, spine). Disease-related symptoms as defined by any of the following: Unintentional weight loss ?10% within the previous 6 months. Significant fatigue (ie, ECOG performance scale 2 or worse; cannot work or unable to perform usual activities). Fevers ?100.43F or 38.0C for 2 or more weeks without evidence of infection. Night sweats for ?1 month without evidence of infection.

## 2022-06-22 ENCOUNTER — Other Ambulatory Visit: Payer: Self-pay | Admitting: Internal Medicine

## 2022-06-22 DIAGNOSIS — N5201 Erectile dysfunction due to arterial insufficiency: Secondary | ICD-10-CM

## 2022-07-02 ENCOUNTER — Other Ambulatory Visit: Payer: Self-pay | Admitting: Internal Medicine

## 2022-07-02 DIAGNOSIS — I10 Essential (primary) hypertension: Secondary | ICD-10-CM

## 2022-07-11 ENCOUNTER — Other Ambulatory Visit: Payer: Self-pay | Admitting: Internal Medicine

## 2022-07-11 DIAGNOSIS — I10 Essential (primary) hypertension: Secondary | ICD-10-CM

## 2022-07-14 ENCOUNTER — Other Ambulatory Visit: Payer: Self-pay | Admitting: Internal Medicine

## 2022-07-14 DIAGNOSIS — I10 Essential (primary) hypertension: Secondary | ICD-10-CM

## 2022-07-15 ENCOUNTER — Other Ambulatory Visit: Payer: Self-pay | Admitting: Internal Medicine

## 2022-07-15 DIAGNOSIS — I10 Essential (primary) hypertension: Secondary | ICD-10-CM

## 2022-07-16 ENCOUNTER — Other Ambulatory Visit: Payer: Self-pay | Admitting: Internal Medicine

## 2022-07-16 ENCOUNTER — Other Ambulatory Visit: Payer: Self-pay

## 2022-07-16 DIAGNOSIS — N5201 Erectile dysfunction due to arterial insufficiency: Secondary | ICD-10-CM

## 2022-07-16 MED ORDER — TADALAFIL 20 MG PO TABS: ORAL_TABLET | ORAL | 2 refills | Status: DC

## 2022-07-25 ENCOUNTER — Telehealth: Payer: Self-pay | Admitting: Pulmonary Disease

## 2022-07-25 NOTE — Telephone Encounter (Signed)
I left a message for the patient to bring in the [place] card for his CPAP. He was asked to call back with any questions.

## 2022-07-26 ENCOUNTER — Ambulatory Visit: Payer: Commercial Managed Care - PPO | Admitting: Pulmonary Disease

## 2022-07-26 ENCOUNTER — Encounter: Payer: Self-pay | Admitting: Pulmonary Disease

## 2022-07-26 DIAGNOSIS — I10 Essential (primary) hypertension: Secondary | ICD-10-CM

## 2022-07-26 DIAGNOSIS — G4733 Obstructive sleep apnea (adult) (pediatric): Secondary | ICD-10-CM

## 2022-07-26 NOTE — Assessment & Plan Note (Signed)
Well controlled 

## 2022-07-26 NOTE — Patient Instructions (Signed)
Try to use CPAP every night

## 2022-07-26 NOTE — Telephone Encounter (Signed)
Patient was in the office today for a follow up.

## 2022-07-26 NOTE — Progress Notes (Signed)
   Subjective:    Patient ID: James Parrish, male    DOB: [birth year], 56 y.o.   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  After CT surgery evaluation he underwent subtotal thyroidectomy at [facility] He has also been diagnosed with CLL, follows with oncology. I reviewed their consultation  Chief Complaint  Patient presents with   Follow-up    Pt was without his CPAP machine for a few weeks because of repair to heating sensor but he has had back since September.   He reports refreshed and more energy when he uses the machine.  His mouth is very dry.  Pressure is okay.  He uses a fullface mask. He still drives a truck.  He had a trip to [facility] with his family. Breathing is okay Blood pressure is better controlled  Significant tests/ events reviewed  LDCT chest 02/2022 RADS 2 right paratracheal lymph node 1.1 cm, gastrohepatic ligament node 1.8 cm -unchanged from prior HST 04/2020 severe OSA, AHI 79/hour, lowest desaturation 77% 06/2020 CPAP titration >> 16 cm  Review of Systems neg for any significant sore throat, dysphagia, itching, sneezing, nasal congestion or excess/ purulent secretions, fever, chills, sweats, unintended wt loss, pleuritic or exertional cp, hempoptysis, orthopnea pnd or change in chronic leg swelling. Also denies presyncope, palpitations, heartburn, abdominal pain, nausea, vomiting, diarrhea or change in bowel or urinary habits, dysuria,hematuria, rash, arthralgias, visual complaints, headache, numbness weakness or ataxia.     Objective:   Physical Exam  Gen. Pleasant, obese, in no distress ENT - no lesions, no post nasal drip Neck: No JVD, no thyromegaly, no carotid bruits Lungs: no use of accessory  muscles, no dullness to percussion, decreased without rales or rhonchi  Cardiovascular: Rhythm regular, heart sounds  normal, no murmurs or gallops, no peripheral edema Musculoskeletal: No deformities, no cyanosis or clubbing , no tremors       Assessment & Plan:

## 2022-07-26 NOTE — Assessment & Plan Note (Signed)
CPAP download shows good control of events on auto settings 10 to 18 cm with average pressure of 15 and maximum pressure of 16 cm. CPAP is certainly helped him.  His average usage is 4.5 hours on nights used. I encouraged better compliance.  He will already has humidity on high.  I asked him to drink some water when he wakes up during the night  Weight loss encouraged, compliance with goal of at least 4-6 hrs every night is the expectation. Advised against medications with sedative side effects Cautioned against driving when sleepy - understanding that sleepiness will vary on a day to day basis

## 2022-09-08 ENCOUNTER — Other Ambulatory Visit: Payer: Self-pay | Admitting: Internal Medicine

## 2022-09-08 DIAGNOSIS — I10 Essential (primary) hypertension: Secondary | ICD-10-CM

## 2022-09-08 DIAGNOSIS — E119 Type 2 diabetes mellitus without complications: Secondary | ICD-10-CM

## 2022-09-08 DIAGNOSIS — Z794 Long term (current) use of insulin: Secondary | ICD-10-CM

## 2022-09-10 MED ORDER — LOSARTAN POTASSIUM 100 MG PO TABS: 100.0000 mg | ORAL_TABLET | Freq: Every day | ORAL | 0 refills | Status: DC

## 2022-09-13 ENCOUNTER — Ambulatory Visit: Payer: Commercial Managed Care - PPO | Admitting: Internal Medicine

## 2022-09-19 ENCOUNTER — Ambulatory Visit: Payer: Commercial Managed Care - PPO | Admitting: Internal Medicine

## 2022-09-20 ENCOUNTER — Other Ambulatory Visit: Payer: Self-pay | Admitting: Internal Medicine

## 2022-09-20 DIAGNOSIS — I10 Essential (primary) hypertension: Secondary | ICD-10-CM

## 2022-09-23 ENCOUNTER — Other Ambulatory Visit: Payer: Self-pay | Admitting: Internal Medicine

## 2022-09-23 DIAGNOSIS — I10 Essential (primary) hypertension: Secondary | ICD-10-CM

## 2022-09-30 ENCOUNTER — Other Ambulatory Visit: Payer: Self-pay | Admitting: Internal Medicine

## 2022-10-06 ENCOUNTER — Other Ambulatory Visit: Payer: Self-pay | Admitting: Internal Medicine

## 2022-10-06 DIAGNOSIS — E119 Type 2 diabetes mellitus without complications: Secondary | ICD-10-CM

## 2022-10-06 DIAGNOSIS — I10 Essential (primary) hypertension: Secondary | ICD-10-CM

## 2022-10-08 ENCOUNTER — Other Ambulatory Visit: Payer: Self-pay | Admitting: Internal Medicine

## 2022-10-08 DIAGNOSIS — I10 Essential (primary) hypertension: Secondary | ICD-10-CM

## 2022-10-21 ENCOUNTER — Other Ambulatory Visit: Payer: Self-pay | Admitting: Internal Medicine

## 2022-10-21 DIAGNOSIS — I10 Essential (primary) hypertension: Secondary | ICD-10-CM

## 2022-10-22 ENCOUNTER — Other Ambulatory Visit: Payer: Self-pay | Admitting: Internal Medicine

## 2022-10-22 DIAGNOSIS — I10 Essential (primary) hypertension: Secondary | ICD-10-CM

## 2022-10-26 ENCOUNTER — Other Ambulatory Visit: Payer: Self-pay | Admitting: Internal Medicine

## 2022-10-26 DIAGNOSIS — I10 Essential (primary) hypertension: Secondary | ICD-10-CM

## 2022-10-28 ENCOUNTER — Other Ambulatory Visit: Payer: Self-pay | Admitting: Internal Medicine

## 2022-10-28 DIAGNOSIS — I10 Essential (primary) hypertension: Secondary | ICD-10-CM

## 2022-11-01 ENCOUNTER — Telehealth: Payer: Self-pay

## 2022-11-01 ENCOUNTER — Ambulatory Visit: Payer: 59 | Admitting: Internal Medicine

## 2022-11-01 ENCOUNTER — Encounter: Payer: Self-pay | Admitting: Internal Medicine

## 2022-11-01 ENCOUNTER — Other Ambulatory Visit: Payer: Self-pay | Admitting: Internal Medicine

## 2022-11-01 VITALS — BP 134/98 | HR 96 | Temp 98.3°F | Resp 16 | Ht 74.0 in | Wt 266.0 lb

## 2022-11-01 DIAGNOSIS — Z0000 Encounter for general adult medical examination without abnormal findings: Secondary | ICD-10-CM | POA: Diagnosis not present

## 2022-11-01 DIAGNOSIS — R0609 Other forms of dyspnea: Secondary | ICD-10-CM | POA: Diagnosis not present

## 2022-11-01 DIAGNOSIS — Z0001 Encounter for general adult medical examination with abnormal findings: Secondary | ICD-10-CM

## 2022-11-01 DIAGNOSIS — Z794 Long term (current) use of insulin: Secondary | ICD-10-CM

## 2022-11-01 DIAGNOSIS — C9110 Chronic lymphocytic leukemia of B-cell type not having achieved remission: Secondary | ICD-10-CM | POA: Diagnosis not present

## 2022-11-01 DIAGNOSIS — Z23 Encounter for immunization: Secondary | ICD-10-CM

## 2022-11-01 DIAGNOSIS — I10 Essential (primary) hypertension: Secondary | ICD-10-CM | POA: Diagnosis not present

## 2022-11-01 DIAGNOSIS — E785 Hyperlipidemia, unspecified: Secondary | ICD-10-CM | POA: Diagnosis not present

## 2022-11-01 DIAGNOSIS — R9431 Abnormal electrocardiogram [ECG] [EKG]: Secondary | ICD-10-CM

## 2022-11-01 DIAGNOSIS — E119 Type 2 diabetes mellitus without complications: Secondary | ICD-10-CM

## 2022-11-01 LAB — CBC WITH DIFFERENTIAL/PLATELET
Basophils Absolute: 0 10*3/uL (ref 0.0–0.1)
Basophils Relative: 0.1 % (ref 0.0–3.0)
Eosinophils Absolute: 0.1 10*3/uL (ref 0.0–0.7)
Eosinophils Relative: 0.4 % (ref 0.0–5.0)
HCT: 50.2 % (ref 39.0–52.0)
Hemoglobin: 16 g/dL (ref 13.0–17.0)
Lymphocytes Relative: 79.8 % — ABNORMAL HIGH (ref 12.0–46.0)
Lymphs Abs: 26.5 10*3/uL — ABNORMAL HIGH (ref 0.7–4.0)
MCHC: 31.9 g/dL (ref 30.0–36.0)
MCV: 78.3 fl (ref 78.0–100.0)
Monocytes Absolute: 0.8 10*3/uL (ref 0.1–1.0)
Monocytes Relative: 2.4 % — ABNORMAL LOW (ref 3.0–12.0)
Neutro Abs: 5.7 10*3/uL (ref 1.4–7.7)
Neutrophils Relative %: 17.3 % — ABNORMAL LOW (ref 43.0–77.0)
Platelets: 225 10*3/uL (ref 150.0–400.0)
RBC: 6.41 Mil/uL — ABNORMAL HIGH (ref 4.22–5.81)
RDW: 14.9 % (ref 11.5–15.5)
WBC: 33.2 10*3/uL (ref 4.0–10.5)

## 2022-11-01 LAB — BASIC METABOLIC PANEL
BUN: 23 mg/dL (ref 6–23)
CO2: 29 mEq/L (ref 19–32)
Calcium: 10.2 mg/dL (ref 8.4–10.5)
Chloride: 103 mEq/L (ref 96–112)
Creatinine, Ser: 1.28 mg/dL (ref 0.40–1.50)
GFR: 61.54 mL/min (ref >60.00)
Glucose, Bld: 112 mg/dL — ABNORMAL HIGH (ref 70–99)
Potassium: 4.1 mEq/L (ref 3.5–5.1)
Sodium: 141 mEq/L (ref 135–145)

## 2022-11-01 LAB — TROPONIN I (HIGH SENSITIVITY): High Sens Troponin I: 6 ng/L (ref 2–17)

## 2022-11-01 LAB — LIPID PANEL
Cholesterol: 119 mg/dL (ref 0–200)
HDL: 30.5 mg/dL — ABNORMAL LOW (ref >39.00)
NonHDL: 88.06
Total CHOL/HDL Ratio: 4
Triglycerides: 225 mg/dL — ABNORMAL HIGH (ref 0.0–149.0)
VLDL: 45 mg/dL — ABNORMAL HIGH (ref 0.0–40.0)

## 2022-11-01 LAB — URINALYSIS, ROUTINE W REFLEX MICROSCOPIC
Bilirubin Urine: NEGATIVE
Hgb urine dipstick: NEGATIVE
Ketones, ur: NEGATIVE
Leukocytes,Ua: NEGATIVE
Nitrite: NEGATIVE
RBC / HPF: NONE SEEN (ref 0–?)
Specific Gravity, Urine: 1.015 (ref 1.000–1.030)
Total Protein, Urine: NEGATIVE
Urine Glucose: >=1000 — AB
Urobilinogen, UA: 0.2 (ref 0.0–1.0)
pH: 6 (ref 5.0–8.0)

## 2022-11-01 LAB — MICROALBUMIN / CREATININE URINE RATIO
Creatinine,U: 118.8 mg/dL
Microalb Creat Ratio: 0.6 mg/g (ref 0.0–30.0)
Microalb, Ur: <0.7 mg/dL (ref 0.0–1.9)

## 2022-11-01 LAB — BRAIN NATRIURETIC PEPTIDE: Pro B Natriuretic peptide (BNP): 3 pg/mL (ref 0.0–100.0)

## 2022-11-01 LAB — LDL CHOLESTEROL, DIRECT: Direct LDL: 63 mg/dL

## 2022-11-01 NOTE — Patient Instructions (Signed)
Hypertension, Adult High blood pressure (hypertension) is when the force of blood pumping through the arteries is too strong. The arteries are the blood vessels that carry blood from the heart throughout the body. Hypertension forces the heart to work harder to pump blood and may cause arteries to become narrow or stiff. Untreated or uncontrolled hypertension can lead to a heart attack, heart failure, a stroke, kidney disease, and other problems. A blood pressure reading consists of a higher number over a lower number. Ideally, your blood pressure should be below 120/80. The first ("top") number is called the systolic pressure. It is a measure of the pressure in your arteries as your heart beats. The second ("bottom") number is called the diastolic pressure. It is a measure of the pressure in your arteries as the heart relaxes. What are the causes? The exact cause of this condition is not known. There are some conditions that result in high blood pressure. What increases the risk? Certain factors may make you more likely to develop high blood pressure. Some of these risk factors are under your control, including: Smoking. Not getting enough exercise or physical activity. Being overweight. Having too much fat, sugar, calories, or salt (sodium) in your diet. Drinking too much alcohol. Other risk factors include: Having a personal history of heart disease, diabetes, high cholesterol, or kidney disease. Stress. Having a family history of high blood pressure and high cholesterol. Having obstructive sleep apnea. Age. The risk increases with age. What are the signs or symptoms? High blood pressure may not cause symptoms. Very high blood pressure (hypertensive crisis) may cause: Headache. Fast or irregular heartbeats (palpitations). Shortness of breath. Nosebleed. Nausea and vomiting. Vision changes. Severe chest pain, dizziness, and seizures. How is this diagnosed? This condition is diagnosed by  measuring your blood pressure while you are seated, with your arm resting on a flat surface, your legs uncrossed, and your feet flat on the floor. The cuff of the blood pressure monitor will be placed directly against the skin of your upper arm at the level of your heart. Blood pressure should be measured at least twice using the same arm. Certain conditions can cause a difference in blood pressure between your right and left arms. If you have a high blood pressure reading during one visit or you have normal blood pressure with other risk factors, you may be asked to: Return on a different day to have your blood pressure checked again. Monitor your blood pressure at home for 1 week or longer. If you are diagnosed with hypertension, you may have other blood or imaging tests to help your health care provider understand your overall risk for other conditions. How is this treated? This condition is treated by making healthy lifestyle changes, such as eating healthy foods, exercising more, and reducing your alcohol intake. You may be referred for counseling on a healthy diet and physical activity. Your health care provider may prescribe medicine if lifestyle changes are not enough to get your blood pressure under control and if: Your systolic blood pressure is above 130. Your diastolic blood pressure is above 80. Your personal target blood pressure may vary depending on your medical conditions, your age, and other factors. Follow these instructions at home: Eating and drinking  Eat a diet that is high in fiber and potassium, and low in sodium, added sugar, and fat. An example of this eating plan is called the DASH diet. DASH stands for Dietary Approaches to Stop Hypertension. To eat this way: Eat   plenty of fresh fruits and vegetables. Try to fill one half of your plate at each meal with fruits and vegetables. Eat whole grains, such as whole-wheat pasta, brown rice, or whole-grain bread. Fill about one  fourth of your plate with whole grains. Eat or drink low-fat dairy products, such as skim milk or low-fat yogurt. Avoid fatty cuts of meat, processed or cured meats, and poultry with skin. Fill about one fourth of your plate with lean proteins, such as fish, chicken without skin, beans, eggs, or tofu. Avoid pre-made and processed foods. These tend to be higher in sodium, added sugar, and fat. Reduce your daily sodium intake. Many people with hypertension should eat less than 1,500 mg of sodium a day. Do not drink alcohol if: Your health care provider tells you not to drink. You are pregnant, may be pregnant, or are planning to become pregnant. If you drink alcohol: Limit how much you have to: 0-1 drink a day for women. 0-2 drinks a day for men. Know how much alcohol is in your drink. In the [place] one drink equals one 12 oz bottle of beer (355 mL), one 5 oz glass of wine (148 mL), or one 1 oz glass of hard liquor (44 mL). Lifestyle  Work with your health care provider to maintain a healthy body weight or to lose weight. Ask what an ideal weight is for you. Get at least 30 minutes of exercise that causes your heart to beat faster (aerobic exercise) most days of the week. Activities may include walking, swimming, or biking. Include exercise to strengthen your muscles (resistance exercise), such as Pilates or lifting weights, as part of your weekly exercise routine. Try to do these types of exercises for 30 minutes at least 3 days a week. Do not use any products that contain nicotine or tobacco. These products include cigarettes, chewing tobacco, and vaping devices, such as e-cigarettes. If you need help quitting, ask your health care provider. Monitor your blood pressure at home as told by your health care provider. Keep all follow-up visits. This is important. Medicines Take over-the-counter and prescription medicines only as told by your health care provider. Follow directions carefully. Blood  pressure medicines must be taken as prescribed. Do not skip doses of blood pressure medicine. Doing this puts you at risk for problems and can make the medicine less effective. Ask your health care provider about side effects or reactions to medicines that you should watch for. Contact a health care provider if you: Think you are having a reaction to a medicine you are taking. Have headaches that keep coming back (recurring). Feel dizzy. Have swelling in your ankles. Have trouble with your vision. Get help right away if you: Develop a severe headache or confusion. Have unusual weakness or numbness. Feel faint. Have severe pain in your chest or abdomen. Vomit repeatedly. Have trouble breathing. These symptoms may be an emergency. Get help right away. Call 911. Do not wait to see if the symptoms will go away. Do not drive yourself to the hospital. Summary Hypertension is when the force of blood pumping through your arteries is too strong. If this condition is not controlled, it may put you at risk for serious complications. Your personal target blood pressure may vary depending on your medical conditions, your age, and other factors. For most people, a normal blood pressure is less than 120/80. Hypertension is treated with lifestyle changes, medicines, or a combination of both. Lifestyle changes include losing weight, eating a healthy,   low-sodium diet, exercising more, and limiting alcohol. This information is not intended to replace advice given to you by your health care provider. Make sure you discuss any questions you have with your health care provider. Document Revised: 08/08/2021 Document Reviewed: 08/08/2021 Elsevier Patient Education  2023 [employer].  

## 2022-11-01 NOTE — Progress Notes (Signed)
Subjective:  Patient ID: James Parrish, male    DOB: 1966/04/17  Age: 57 y.o. MRN: [id number]  CC: Hypertension, Diabetes, and Hyperlipidemia   HPI COBE VINEY presents for f/up -  He complains of DOE for several months. According the RX refills he is no longer take nebivolol.  Outpatient Medications Prior to Visit  Medication Sig Dispense Refill   amLODipine (NORVASC) 10 MG tablet TAKE 1 TABLET BY MOUTH AT BEDTIME 30 tablet 0   atorvastatin (LIPITOR) 10 MG tablet TAKE ONE TABLET BY MOUTH DAILY 90 tablet 0   dapagliflozin propanediol (FARXIGA) 10 MG TABS tablet TAKE ONE TABLET BY MOUTH DAILY BEFORE BREAKFAST 30 tablet 0   glucose blood test strip Use as instructed to test blood sugar 2 times daily E11.65    Onetouch strips 100 each 12   indapamide (LOZOL) 1.25 MG tablet TAKE ONE TABLET BY MOUTH DAILY 30 tablet 0   insulin glargine, 2 Unit Dial, (TOUJEO MAX SOLOSTAR) 300 UNIT/ML Solostar Pen INJECT 34 UNITS INTO THE SKIN DAILY IN THE AFTERNOON 6 mL 3   losartan (COZAAR) 100 MG tablet Take 1 tablet (100 mg total) by mouth daily. 90 tablet 0   metFORMIN (GLUCOPHAGE) 1000 MG tablet Take 1 tablet (1,000 mg total) by mouth 2 (two) times daily with a meal. 180 tablet 3   Multiple Vitamin (MULTIVITAMIN WITH MINERALS) TABS tablet Take 1 tablet by mouth daily.     Semaglutide, 1 MG/DOSE, (OZEMPIC, 1 MG/DOSE,) 4 MG/3ML SOPN Inject 1 mg into the skin once a week. 9 mL 3   spironolactone (ALDACTONE) 50 MG tablet TAKE 1 TABLET BY MOUTH DAILY 30 tablet 0   tadalafil (CIALIS) 20 MG tablet TAKE ONE TABLET BY MOUTH DAILY AS NEEDED FOR ERECTILE DYSFUNCTION 10 tablet 2   Nebivolol HCl 20 MG TABS TAKE ONE TABLET BY MOUTH DAILY 90 tablet 0   XYOSTED 75 MG/0.5ML SOAJ Inject 75 mg into the skin once a week. 6 mL 0   No facility-administered medications prior to visit.    ROS Review of Systems  Constitutional:  Negative for chills, diaphoresis, fatigue and fever.  HENT: Negative.    Eyes: Negative.    Respiratory:  Positive for shortness of breath. Negative for cough, chest tightness and wheezing.   Cardiovascular:  Negative for chest pain, palpitations and leg swelling.  Gastrointestinal:  Negative for abdominal pain, nausea and vomiting.  Endocrine: Negative.   Genitourinary: Negative.  Negative for difficulty urinating and dysuria.  Musculoskeletal: Negative.  Negative for arthralgias and myalgias.  Skin: Negative.   Neurological:  Negative for dizziness, weakness and headaches.  Hematological:  Negative for adenopathy. Does not bruise/bleed easily.  Psychiatric/Behavioral: Negative.      Objective:  BP (!) 134/98 (BP Location: Left Arm, Patient Position: Sitting, Cuff Size: Large)   Pulse 96   Temp 98.3 F (36.8 C) (Oral)   Resp 16   Ht '6\' 2"'$  (1.88 m)   Wt 266 lb (120.7 kg)   SpO2 98%   BMI 34.15 kg/m   BP Readings from Last 3 Encounters:  11/01/22 (!) 134/98  07/26/22 130/84  06/08/22 (!) 147/107    Wt Readings from Last 3 Encounters:  11/01/22 266 lb (120.7 kg)  07/26/22 275 lb (124.7 kg)  06/08/22 271 lb 3.2 oz (123 kg)    Physical Exam Vitals reviewed.  Constitutional:      Appearance: He is not ill-appearing.  HENT:     Nose: Nose normal.  Mouth/Throat:     Mouth: Mucous membranes are moist.  Eyes:     General: No scleral icterus.    Conjunctiva/sclera: Conjunctivae normal.  Cardiovascular:     Rate and Rhythm: Normal rate and regular rhythm.     Pulses: Normal pulses.     Heart sounds: No murmur heard.    No friction rub. No gallop.     Comments: EKG- NSR, 93 bpm LAE Septal infarct pattern is new No LVH or acute ST/T wave changes Pulmonary:     Effort: Pulmonary effort is normal.     Breath sounds: No stridor. No wheezing, rhonchi or rales.  Abdominal:     General: Abdomen is flat.     Palpations: There is no mass.     Tenderness: There is no abdominal tenderness. There is no guarding.     Hernia: No hernia is present.   Musculoskeletal:        General: Normal range of motion.     Cervical back: Neck supple.     Right lower leg: No edema.     Left lower leg: No edema.  Lymphadenopathy:     Cervical: No cervical adenopathy.  Skin:    General: Skin is warm and dry.  Neurological:     General: No focal deficit present.     Mental Status: He is alert and oriented to person, place, and time.  Psychiatric:        Mood and Affect: Mood normal.        Behavior: Behavior normal.     Lab Results  Component Value Date   WBC 33.2 Repeated and verified X2. (HH) 11/01/2022   HGB 16.0 11/01/2022   HCT 50.2 11/01/2022   PLT 225.0 11/01/2022   GLUCOSE 112 (H) 11/01/2022   CHOL 119 11/01/2022   TRIG 225.0 (H) 11/01/2022   HDL 30.50 (L) 11/01/2022   LDLDIRECT 63.0 11/01/2022   LDLCALC 47 09/22/2021   ALT 20 06/08/2022   AST 17 06/08/2022   NA 141 11/01/2022   K 4.1 11/01/2022   CL 103 11/01/2022   CREATININE 1.28 11/01/2022   BUN 23 11/01/2022   CO2 29 11/01/2022   TSH 2.130 12/01/2021   PSA 0.36 03/15/2022   HGBA1C 7.4 (H) 11/01/2022   MICROALBUR <0.7 11/01/2022    CT CHEST LUNG CA SCREEN LOW DOSE W/O CM  Result Date: 02/16/2022 CLINICAL DATA:  Ex-smoker, quitting 2 years ago. Thirty pack-year history. History of chronic lymphocytic leukemia, recently diagnosed without therapy yet. EXAM: CT CHEST WITHOUT CONTRAST LOW-DOSE FOR LUNG CANCER SCREENING TECHNIQUE: Multidetector CT imaging of the chest was performed following the standard protocol without IV contrast. RADIATION DOSE REDUCTION: This exam was performed according to the departmental dose-optimization program which includes automated exposure control, adjustment of the mA and/or kV according to patient size and/or use of iterative reconstruction technique. COMPARISON:  02/06/2021 PET.  Lung cancer screening CT 01/04/2021 FINDINGS: Cardiovascular: Aortic atherosclerosis. Borderline cardiomegaly, without pericardial effusion. Mediastinum/Nodes:  Suspect left thyroidectomy. Right axillary index node measures 1.9 cm on 10/02 versus 2.1 cm on the prior. The middle mediastinal mass on the prior exam has resolved or been resected. Right paratracheal index node measures 1.1 cm on 23/2 and is similar in size on the prior. Hilar regions poorly evaluated without intravenous contrast. Lungs/Pleura: No pleural fluid. Right-sided pulmonary nodules of maximally volume derived equivalent diameter 2.6 mm. Upper Abdomen: Moderate hepatic steatosis. Colonic stool burden suggests constipation. Normal imaged portions of the spleen, stomach, pancreas, gallbladder,  adrenal glands, kidneys. Index gastrohepatic ligament node measures 1.8 cm on 63/2 versus 1.6 cm on the prior exam (when remeasured). Musculoskeletal: Mild left-sided gynecomastia is not significantly changed. Right shoulder arthroplasty. IMPRESSION: 1. Lung-RADS 2, benign appearance or behavior. Continue annual screening with low-dose chest CT without contrast in 12 months. 2. Relatively similar thoracic and upper abdominal adenopathy. The dominant middle mediastinal mass has either resolved or been resected and most likely was resected as part of an interval left thyroidectomy. 3. Hepatic steatosis. 4. Possible constipation. 5. Aortic Atherosclerosis (ICD10-I70.0). Electronically Signed   By: Abigail Miyamoto M.D.   On: 02/16/2022 13:41   Assessment & Plan:   Reginal was seen today for hypertension, diabetes and hyperlipidemia.  Diagnoses and all orders for this visit:  Primary hypertension- His blood pressure is not adequately well-controlled.  Will restart nebivolol. -     Urinalysis, Routine w reflex microscopic; Future -     CBC with Differential/Platelet; Future -     Basic metabolic panel; Future -     EKG 12-Lead -     Basic metabolic panel -     CBC with Differential/Platelet -     Urinalysis, Routine w reflex microscopic -     nebivolol (BYSTOLIC) 10 MG tablet; Take 1 tablet (10 mg total) by  mouth daily.  Type 2 diabetes mellitus without complication, with long-term current use of insulin ([place])- His blood sugar is adequately well-controlled. -     Hemoglobin A1c; Future -     Basic metabolic panel; Future -     Microalbumin / creatinine urine ratio; Future -     Microalbumin / creatinine urine ratio -     Basic metabolic panel -     Hemoglobin A1c  Hyperlipidemia with target LDL less than 100- LDL goal achieved. Doing well on the statin  -     Lipid panel; Future -     Lipid panel  CLL (chronic lymphocytic leukemia) (HCC)- WBC and lymphocytes are rising. -     CBC with Differential/Platelet; Future -     CBC with Differential/Platelet -     Cancel: Ambulatory referral to Hematology / Oncology -     Ambulatory referral to Hematology / Oncology  DOE (dyspnea on exertion)- EKG is abnormal.  Labs are reassuring.  Will evaluate for ischemia/infarct. -     Troponin I (High Sensitivity); Future -     Brain natriuretic peptide; Future -     MYOCARDIAL PERFUSION IMAGING; Future -     Cardiac Stress Test: Informed Consent Details: Physician/Practitioner Attestation; Transcribe to consent form and obtain patient signature; Future -     Brain natriuretic peptide -     Troponin I (High Sensitivity)  Abnormal EKG -     MYOCARDIAL PERFUSION IMAGING; Future -     Cardiac Stress Test: Informed Consent Details: Physician/Practitioner Attestation; Transcribe to consent form and obtain patient signature; Future  Encounter for general adult medical examination with abnormal findings- Exam completed, labs reviewed, vaccines reviewed and updated, cancer screenings are up-to-date, patient education was given.  Other orders -     Flu Vaccine QUAD 6+ mos PF IM (Fluarix Quad PF) -     LDL cholesterol, direct   I have discontinued Margy Clarks Xyosted and Nebivolol HCl. I am also having him start on nebivolol. Additionally, I am having him maintain his glucose blood, multivitamin with  minerals, metFORMIN, Ozempic (1 MG/DOSE), tadalafil, losartan, atorvastatin, Farxiga, spironolactone, amLODipine, indapamide, and Toujeo Max  SoloStar.  Meds ordered this encounter  Medications   nebivolol (BYSTOLIC) 10 MG tablet    Sig: Take 1 tablet (10 mg total) by mouth daily.    Dispense:  90 tablet    Refill:  0     Follow-up: Return in about 3 months (around 01/31/2023).  Scarlette Calico, MD

## 2022-11-01 NOTE — Telephone Encounter (Signed)
CRITICAL VALUE STICKER  CRITICAL VALUE: WBC 33.2  RECEIVER (on-site recipient of call): Tiye Huwe, [place] NOTIFIED: 1/18 4.49pm  MESSENGER (representative from lab): Santiago Glad  MD NOTIFIED: PCP  TIME OF NOTIFICATION:4.49pm

## 2022-11-02 LAB — HEMOGLOBIN A1C: Hgb A1c MFr Bld: 7.4 % — ABNORMAL HIGH (ref 4.6–6.5)

## 2022-11-02 MED ORDER — NEBIVOLOL HCL 10 MG PO TABS: 10.0000 mg | ORAL_TABLET | Freq: Every day | ORAL | 0 refills | Status: DC

## 2022-11-05 DIAGNOSIS — R9431 Abnormal electrocardiogram [ECG] [EKG]: Secondary | ICD-10-CM | POA: Insufficient documentation

## 2022-11-12 ENCOUNTER — Encounter: Payer: Self-pay | Admitting: Hematology and Oncology

## 2022-11-13 ENCOUNTER — Telehealth: Payer: Self-pay | Admitting: Hematology and Oncology

## 2022-11-13 NOTE — Telephone Encounter (Signed)
Per 1/30 IB, msg left

## 2022-11-14 ENCOUNTER — Other Ambulatory Visit: Payer: Self-pay

## 2022-11-14 ENCOUNTER — Ambulatory Visit: Payer: Self-pay | Admitting: Hematology and Oncology

## 2022-11-15 ENCOUNTER — Ambulatory Visit: Payer: Commercial Managed Care - PPO | Admitting: Internal Medicine

## 2022-11-15 NOTE — Progress Notes (Deleted)
Name: James Parrish  Age/ Sex: 57 y.o., male   MRN/ DOB: [id number], March 21, 1966     PCP: Janith Lima, MD   Reason for Endocrinology Evaluation: Type 2 Diabetes Mellitus  Initial Endocrine Consultative Visit: 07/13/2019    PATIENT IDENTIFIER: James Parrish is a 57 y.o. male with a past medical history of T2Dm, HTN and Dyslipidemia, CLL (12/2021). The patient has followed with Endocrinology clinic since 07/13/2019 for consultative assistance with management of his diabetes.  DIABETIC HISTORY:  James Parrish was diagnosed with DM prior to 2010, was initially on oral glycemic agents, insulin started in 2013. His hemoglobin A1c has ranged from 7.2% in 2018, peaking at 8.8% in 2019.  On his initial visit to our clinic, his A1c 7.7% . He was on lantus, victoza, metformin and Jardiance. We switched Victoza to Ozempic.    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  He checks his blood sugars 1 times . The patient has not had hypoglycemic episodes since the last clinic visit.   He had a follow-up with his PCP on 11/01/2022 Was seen by pulmonary for OSA 07/26/2022   He was diagnosed with CLL 12/2021, follows up with oncology Energy level is somewhat fluctuates  Denies nausea , vomiting or diarrhea  Denies chest pain or sob  Weight stable       HOME DIABETES REGIMEN:  Ozempic 1.0 mg weekly ( Fridays )  Farxiga 10 mg daily  Metformin 1000 mg Twice daily  Toujeo  34 units daily      DIABETIC COMPLICATIONS: Microvascular complications:    Denies: CKD, retinopathy, neuropathy  Last eye exam: Completed 10/2021   Macrovascular complications:    Denies: CAD, PVD, CVA  METER  DOWNLOAD SUMMARY: 5/6-5/19/2023 Fingerstick Blood Glucose Tests = 10 Average Number Tests/Day = 0.7 Overall Mean FS Glucose = 105 Standard Deviation = 14.8  BG Ranges: Low = 86 High = 138    Hypoglycemic Events/30 Days: BG < 50 = 0 Episodes of symptomatic severe hypoglycemia = 0     HISTORY:  Past Medical History:  Past Medical History:  Diagnosis Date   Complication of anesthesia    hard to wake up    Diabetes mellitus without complication [facility])    ED (erectile dysfunction)    History of COVID-19 04/2019   Hyperlipidemia    Hypertension    Sleep apnea    uses CPAP nightly   Past Surgical History:  Past Surgical History:  Procedure Laterality Date   BRONCHIAL NEEDLE ASPIRATION BIOPSY  05/29/2021   Procedure: BRONCHIAL NEEDLE ASPIRATION BIOPSIES;  Surgeon: Garner Nash, DO;  Location: [facility];  Service: Pulmonary;;   COLONOSCOPY     finger surgery Right    Index   KNEE SURGERY Left    KNEE SURGERY Right    ORIF PATELLA Right    REVERSE SHOULDER ARTHROPLASTY Right 08/31/2020   Procedure: REVERSE SHOULDER ARTHROPLASTY;  Surgeon: Hiram Gash, MD;  Location: [place];  Service: Orthopedics;  Laterality: Right;   SHOULDER ARTHROSCOPY WITH ROTATOR CUFF REPAIR Left 02/04/2020   Procedure: SHOULDER ARTHROSCOPY WITH ROTATOR CUFF REPAIR WITH BICEP TENODESIS;  Surgeon:  Hiram Gash, MD;  Location: [place];  Service: Orthopedics;  Laterality: Left;   VIDEO BRONCHOSCOPY WITH ENDOBRONCHIAL ULTRASOUND Left 05/29/2021   Procedure: VIDEO BRONCHOSCOPY WITH ENDOBRONCHIAL ULTRASOUND;  Surgeon: Garner Nash, DO;  Location: [place];  Service: Pulmonary;  Laterality: Left;   Social History:  reports that he quit smoking about 2 years ago. His smoking use included cigarettes. He has a 37.50 pack-year smoking history. He has been exposed to tobacco smoke. He has never used smokeless tobacco. He reports that he does not currently use  alcohol. He reports that he does not use drugs. Family History:  Family History  Problem Relation Age of Onset   Cancer Mother        breast cancer    Hypertension Mother    Diabetes Mother    Venous thrombosis Son    Colon cancer Neg Hx    Esophageal cancer Neg Hx    Stomach cancer Neg Hx    Rectal cancer Neg Hx      HOME MEDICATIONS: Allergies as of 11/15/2022   No Known Allergies      Medication List        Accurate as of November 15, 2022  7:31 AM. If you have any questions, ask your nurse or doctor.          amLODipine 10 MG tablet Commonly known as: NORVASC TAKE 1 TABLET BY MOUTH AT BEDTIME   atorvastatin 10 MG tablet Commonly known as: LIPITOR TAKE ONE TABLET BY MOUTH DAILY   Farxiga 10 MG Tabs tablet Generic drug: dapagliflozin propanediol TAKE ONE TABLET BY MOUTH DAILY BEFORE BREAKFAST   glucose blood test strip Use as instructed to test blood sugar 2 times daily E11.65    Onetouch strips   indapamide 1.25 MG tablet Commonly known as: LOZOL TAKE ONE TABLET BY MOUTH DAILY   losartan 100 MG tablet Commonly known as: COZAAR Take 1 tablet (100 mg total) by mouth daily.   metFORMIN 1000 MG tablet Commonly known as: GLUCOPHAGE Take 1 tablet (1,000 mg total) by mouth 2 (two) times daily with a meal.   multivitamin with minerals Tabs tablet Take 1 tablet by mouth daily.   nebivolol 10 MG tablet Commonly known as: BYSTOLIC Take 1 tablet (10 mg total) by mouth daily.   Ozempic (1 MG/DOSE) 4 MG/3ML Sopn Generic drug: Semaglutide (1 MG/DOSE) Inject 1 mg into the skin once a week.   spironolactone 50 MG tablet Commonly known as: ALDACTONE TAKE 1 TABLET BY MOUTH DAILY   tadalafil 20 MG tablet Commonly known as: CIALIS TAKE ONE TABLET BY MOUTH DAILY AS NEEDED FOR ERECTILE DYSFUNCTION   Toujeo Max SoloStar 300 UNIT/ML Solostar Pen Generic drug: insulin glargine (2 Unit Dial) INJECT 34 UNITS INTO THE SKIN DAILY IN THE AFTERNOON          OBJECTIVE:   Vital Signs: There were no vitals taken for this visit.   Wt Readings from Last 3 Encounters:  11/01/22 266 lb (120.7 kg)  07/26/22 275 lb (124.7 kg)  06/08/22 271 lb 3.2 oz (123 kg)     Exam: General: Pt appears well and is in NAD  Lungs: Clear with good BS bilat with no rales, rhonchi, or wheezes  Heart: RRR with normal S1 and S2 and no gallops; no murmurs; no rub  Abdomen: Normoactive bowel sounds, soft, nontender, without masses or organomegaly palpable  Extremities: No pretibial edema.   Neuro: MS is good with appropriate affect, pt is  alert and Ox3    DM foot exam: 03/02/2022 The skin of the feet is intact without sores or ulcerations. The pedal pulses are 2+ on right and 2+ on left. The sensation is intact to a screening 5.07, 10 gram monofilament bilaterally     DATA REVIEWED:  Lab Results  Component Value Date   HGBA1C 7.4 (H) 11/01/2022   HGBA1C 7.0 (A) 03/02/2022   HGBA1C 6.8 (A) 09/22/2021    Latest Reference Range & Units 11/01/22 15:28  Sodium 135 - 145 mEq/L 141  Potassium 3.5 - 5.1 mEq/L 4.1  Chloride 96 - 112 mEq/L 103  CO2 19 - 32 mEq/L 29  Glucose 70 - 99 mg/dL 112 (H)  BUN 6 - 23 mg/dL 23  Creatinine 0.40 - 1.50 mg/dL 1.28  Calcium 8.4 - 10.5 mg/dL 10.2  GFR >60.00 mL/min 61.54  Pro B Natriuretic peptide (BNP) 0.0 - 100.0 pg/mL 3.0    Latest Reference Range & Units 11/01/22 15:28  Total CHOL/HDL Ratio  4  Cholesterol 0 - 200 mg/dL 119  HDL Cholesterol >39.00 mg/dL 30.50 (L)  Direct LDL mg/dL 63.0  MICROALB/CREAT RATIO 0.0 - 30.0 mg/g 0.6  NonHDL  88.06  Triglycerides 0.0 - 149.0 mg/dL 225.0 (H)  VLDL 0.0 - 40.0 mg/dL 45.0 (H)  (L): Data is abnormally low (H): Data is abnormally high    ASSESSMENT / PLAN / RECOMMENDATIONS:   1) Type 2 Diabetes Mellitus, Optimally controlled, Without  complications - Most recent A1c of 7.4 %. Goal A1c < 7.0 %.     - A1c continues to be optimal - No  changes  MEDICATIONS: -Continue Ozempic 1.0 mg weekly  -Continue Farxiga 10 mg daily  mg daily  -Continue Metformin 1000 mg Twice daily  -Continue Toujeo 34 units daily    EDUCATION / INSTRUCTIONS: BG monitoring instructions: Patient is instructed to check his blood sugars 1 time a day. Call [facility] if: BG persistently < 70  I reviewed the Rule of 15 for the treatment of hypoglycemia in detail with the patient. Literature supplied.       F/U in 6 months    Signed electronically by: Mack Guise, MD  [facility]  [facility] [address] [place], [place] [address] Phone: [phone] FAX: [phone]   CC: Janith Lima, MD [place] [address] Phone: [phone]  Fax: [phone]  Return to Endocrinology clinic as below: Future Appointments  Date Time Provider [place]  11/15/2022 10:50 AM Saidah Kempton, Melanie Crazier, MD [facility] None  11/23/2022 12:30 PM [facility] None  11/23/2022  1:00 PM Lincoln Brigham, PA-C [facility] None  01/28/2023 10:00 AM Janith Lima, MD [facility] None

## 2022-11-16 ENCOUNTER — Other Ambulatory Visit (HOSPITAL_COMMUNITY): Payer: Self-pay

## 2022-11-19 ENCOUNTER — Other Ambulatory Visit: Payer: Self-pay | Admitting: Internal Medicine

## 2022-11-19 DIAGNOSIS — I10 Essential (primary) hypertension: Secondary | ICD-10-CM

## 2022-11-22 ENCOUNTER — Other Ambulatory Visit: Payer: Self-pay | Admitting: Internal Medicine

## 2022-11-22 DIAGNOSIS — I10 Essential (primary) hypertension: Secondary | ICD-10-CM

## 2022-11-22 MED ORDER — AMLODIPINE BESYLATE 10 MG PO TABS: 10.0000 mg | ORAL_TABLET | Freq: Every day | ORAL | 0 refills | Status: DC

## 2022-11-22 MED ORDER — SPIRONOLACTONE 50 MG PO TABS: 50.0000 mg | ORAL_TABLET | Freq: Every day | ORAL | 0 refills | Status: DC

## 2022-11-22 MED ORDER — INDAPAMIDE 1.25 MG PO TABS: 1.2500 mg | ORAL_TABLET | Freq: Every day | ORAL | 0 refills | Status: DC

## 2022-11-22 MED ORDER — NEBIVOLOL HCL 10 MG PO TABS: 10.0000 mg | ORAL_TABLET | Freq: Every day | ORAL | 0 refills | Status: DC

## 2022-11-23 ENCOUNTER — Inpatient Hospital Stay (HOSPITAL_BASED_OUTPATIENT_CLINIC_OR_DEPARTMENT_OTHER): Payer: 59 | Admitting: Physician Assistant

## 2022-11-23 ENCOUNTER — Other Ambulatory Visit: Payer: Self-pay | Admitting: Physician Assistant

## 2022-11-23 ENCOUNTER — Inpatient Hospital Stay: Payer: 59 | Attending: Physician Assistant

## 2022-11-23 VITALS — BP 134/91 | HR 79 | Temp 97.8°F | Resp 20 | Wt 266.9 lb

## 2022-11-23 DIAGNOSIS — C9110 Chronic lymphocytic leukemia of B-cell type not having achieved remission: Secondary | ICD-10-CM

## 2022-11-23 DIAGNOSIS — Z87891 Personal history of nicotine dependence: Secondary | ICD-10-CM | POA: Insufficient documentation

## 2022-11-23 LAB — CMP (CANCER CENTER ONLY)
ALT: 15 U/L (ref 0–44)
AST: 15 U/L (ref 15–41)
Albumin: 4.6 g/dL (ref 3.5–5.0)
Alkaline Phosphatase: 46 U/L (ref 38–126)
Anion gap: 7 (ref 5–15)
BUN: 19 mg/dL (ref 6–20)
CO2: 26 mmol/L (ref 22–32)
Calcium: 9.6 mg/dL (ref 8.9–10.3)
Chloride: 106 mmol/L (ref 98–111)
Creatinine: 1.11 mg/dL (ref 0.61–1.24)
GFR, Estimated: >60 mL/min (ref >60)
Glucose, Bld: 102 mg/dL — ABNORMAL HIGH (ref 70–99)
Potassium: 3.8 mmol/L (ref 3.5–5.1)
Sodium: 139 mmol/L (ref 135–145)
Total Bilirubin: 0.4 mg/dL (ref 0.3–1.2)
Total Protein: 7.2 g/dL (ref 6.5–8.1)

## 2022-11-23 LAB — CBC WITH DIFFERENTIAL (CANCER CENTER ONLY)
Abs Immature Granulocytes: 0.1 10*3/uL — ABNORMAL HIGH (ref 0.00–0.07)
Basophils Absolute: 0.1 10*3/uL (ref 0.0–0.1)
Basophils Relative: 0 %
Eosinophils Absolute: 0.1 10*3/uL (ref 0.0–0.5)
Eosinophils Relative: 0 %
HCT: 47.2 % (ref 39.0–52.0)
Hemoglobin: 15.4 g/dL (ref 13.0–17.0)
Immature Granulocytes: 0 %
Lymphocytes Relative: 80 %
Lymphs Abs: 25.6 10*3/uL — ABNORMAL HIGH (ref 0.7–4.0)
MCH: 25.1 pg — ABNORMAL LOW (ref 26.0–34.0)
MCHC: 32.6 g/dL (ref 30.0–36.0)
MCV: 76.9 fL — ABNORMAL LOW (ref 80.0–100.0)
Monocytes Absolute: 1.7 10*3/uL — ABNORMAL HIGH (ref 0.1–1.0)
Monocytes Relative: 5 %
Neutro Abs: 4.9 10*3/uL (ref 1.7–7.7)
Neutrophils Relative %: 15 %
Platelet Count: 212 10*3/uL (ref 150–400)
RBC: 6.14 MIL/uL — ABNORMAL HIGH (ref 4.22–5.81)
RDW: 14.8 % (ref 11.5–15.5)
Smear Review: NORMAL
WBC Count: 32.5 10*3/uL — ABNORMAL HIGH (ref 4.0–10.5)
nRBC: 0.1 % (ref 0.0–0.2)

## 2022-11-23 NOTE — Progress Notes (Signed)
[place] Telephone:([phone]) [phone]   Fax:([phone]) [phone]  PROGRESS NOTE  Patient Care Team: Janith Lima, MD as PCP - General (Internal Medicine) Sueanne Margarita, MD as PCP - Sleep Medicine (Cardiology)  Hematological/Oncological History # CLL, Rai Stage 0. Prognostic Markers Pending.  01/25/2020: WBC 9.6, Hgb 15.6, MCV 79.1, Plt 241 05/29/2021: WBC 13.3, Hgb 15.4, MCV 76.9, Plt 203 11/16/2021: patient had routine CBC collected, showed WBC 15.9, Hgb 14.8, MCV 76.4, ALC 11.6 12/14/2021: establish care with Dr. Lorenso Courier   Interval History:  James Parrish 57 y.o. male with medical history significant for CLL who presents for a follow up visit. The patient's last visit was on 06/08/2022. In the interim, he denies any changes to his health.   On exam today James Parrish reports he can still feel some lymph nodes around his posterior neck but otherwise is feeling great. His energy is stable. He is able to complete all his ADLs on his own. He denies nausea, vomiting or abdominal pain. Bowel habits are unchanged without recurrent episodes of diarrhea or constipation. He denies easy bruising or signs of active bleeding. He denies any B symptoms including fevers, night sweats or weight loss. He has no other complaints.  A full 10 point ROS was otherwise negative.  MEDICAL HISTORY:  Past Medical History:  Diagnosis Date   Complication of anesthesia    hard to wake up    Diabetes mellitus without complication [facility])    ED (erectile dysfunction)    History of COVID-19 04/2019   Hyperlipidemia    Hypertension    Sleep apnea    uses CPAP nightly    SURGICAL HISTORY: Past Surgical History:  Procedure Laterality Date   BRONCHIAL NEEDLE ASPIRATION BIOPSY  05/29/2021   Procedure: BRONCHIAL NEEDLE ASPIRATION BIOPSIES;  Surgeon: Garner Nash, DO;  Location: [place] [facility];  Service: Pulmonary;;   COLONOSCOPY     finger surgery Right    Index   KNEE SURGERY Left    KNEE SURGERY Right     ORIF PATELLA Right    REVERSE SHOULDER ARTHROPLASTY Right 08/31/2020   Procedure: REVERSE SHOULDER ARTHROPLASTY;  Surgeon: Hiram Gash, MD;  Location: [place];  Service: Orthopedics;  Laterality: Right;   SHOULDER ARTHROSCOPY WITH ROTATOR CUFF REPAIR Left 02/04/2020   Procedure: SHOULDER ARTHROSCOPY WITH ROTATOR CUFF REPAIR WITH BICEP TENODESIS;  Surgeon: Hiram Gash, MD;  Location: [place];  Service: Orthopedics;  Laterality: Left;   VIDEO BRONCHOSCOPY WITH ENDOBRONCHIAL ULTRASOUND Left 05/29/2021   Procedure: VIDEO BRONCHOSCOPY WITH ENDOBRONCHIAL ULTRASOUND;  Surgeon: Garner Nash, DO;  Location: [place];  Service: Pulmonary;  Laterality: Left;    SOCIAL HISTORY: Social History   Socioeconomic History   Marital status: Married    Spouse name: Not on file   Number of children: Not on file   Years of education: Not on file   Highest education level: Not on file  Occupational History   Not on file  Tobacco Use   Smoking status: Former    Packs/day: 1.50    Years: 25.00    Total pack years: 37.50    Types: Cigarettes    Quit date: 04/2020    Years since quitting: 2.6    Passive exposure: Past   Smokeless tobacco: Never  Vaping Use   Vaping Use: Never used  Substance and Sexual Activity   Alcohol use: Not Currently    Comment: occasionally   Drug use: Never  Sexual activity: Yes    Partners: Female  Other Topics Concern   Not on file  Social History Narrative   Not on file   Social Determinants of Health   Financial Resource Strain: Not on file  Food Insecurity: Not on file  Transportation Needs: Not on file  Physical Activity: Not on file  Stress: Not on file  Social Connections: Not on file  Intimate Partner Violence: Not on file    FAMILY HISTORY: Family History  Problem Relation Age of Onset   Cancer Mother        breast cancer    Hypertension Mother    Diabetes Mother    Venous thrombosis Son    Colon  cancer Neg Hx    Esophageal cancer Neg Hx    Stomach cancer Neg Hx    Rectal cancer Neg Hx     ALLERGIES:  has No Known Allergies.  MEDICATIONS:  Current Outpatient Medications  Medication Sig Dispense Refill   amLODipine (NORVASC) 10 MG tablet Take 1 tablet (10 mg total) by mouth at bedtime. 90 tablet 0   atorvastatin (LIPITOR) 10 MG tablet TAKE ONE TABLET BY MOUTH DAILY 90 tablet 0   dapagliflozin propanediol (FARXIGA) 10 MG TABS tablet TAKE ONE TABLET BY MOUTH DAILY BEFORE BREAKFAST 30 tablet 0   glucose blood test strip Use as instructed to test blood sugar 2 times daily E11.65    Onetouch strips 100 each 12   indapamide (LOZOL) 1.25 MG tablet Take 1 tablet (1.25 mg total) by mouth daily. 90 tablet 0   insulin glargine, 2 Unit Dial, (TOUJEO MAX SOLOSTAR) 300 UNIT/ML Solostar Pen INJECT 34 UNITS INTO THE SKIN DAILY IN THE AFTERNOON 6 mL 3   losartan (COZAAR) 100 MG tablet Take 1 tablet (100 mg total) by mouth daily. 90 tablet 0   metFORMIN (GLUCOPHAGE) 1000 MG tablet TAKE ONE TABLET BY MOUTH TWICE A DAY WITH A MEAL 60 tablet 1   Multiple Vitamin (MULTIVITAMIN WITH MINERALS) TABS tablet Take 1 tablet by mouth daily.     nebivolol (BYSTOLIC) 10 MG tablet Take 1 tablet (10 mg total) by mouth daily. 90 tablet 0   Semaglutide, 1 MG/DOSE, (OZEMPIC, 1 MG/DOSE,) 4 MG/3ML SOPN Inject 1 mg into the skin once a week. 9 mL 3   spironolactone (ALDACTONE) 50 MG tablet Take 1 tablet (50 mg total) by mouth daily. 90 tablet 0   tadalafil (CIALIS) 20 MG tablet TAKE ONE TABLET BY MOUTH DAILY AS NEEDED FOR ERECTILE DYSFUNCTION 10 tablet 2   No current facility-administered medications for this visit.    REVIEW OF SYSTEMS:   Constitutional: ( - ) fevers, ( - )  chills , ( - ) night sweats Eyes: ( - ) blurriness of vision, ( - ) double vision, ( - ) watery eyes Ears, nose, mouth, throat, and face: ( - ) mucositis, ( - ) sore throat Respiratory: ( - ) cough, ( - ) dyspnea, ( - ) wheezes Cardiovascular:  ( - ) palpitation, ( - ) chest discomfort, ( - ) lower extremity swelling Gastrointestinal:  ( - ) nausea, ( - ) heartburn, ( - ) change in bowel habits Skin: ( - ) abnormal skin rashes Lymphatics: ( - ) new lymphadenopathy, ( - ) easy bruising Neurological: ( - ) numbness, ( - ) tingling, ( - ) new weaknesses Behavioral/Psych: ( - ) mood change, ( - ) new changes  All other systems were reviewed with the patient and are  negative.  PHYSICAL EXAMINATION: ECOG PERFORMANCE STATUS: 0 - Asymptomatic  Vitals:   11/23/22 1255  BP: (!) 134/91  Pulse: 79  Resp: 20  Temp: 97.8 F (36.6 C)  SpO2: 99%   Filed Weights   11/23/22 1255  Weight: 266 lb 14.4 oz (121.1 kg)    GENERAL: Well-appearing middle-aged African-American male alert, no distress and comfortable SKIN: skin color, texture, turgor are normal, no rashes or significant lesions EYES: conjunctiva are pink and non-injected, sclera clear NECK: supple, non-tender LYMPH: Mildly prominent left-sided cervical lymph nodes, no other overt lymphadenopathy noted on exam LUNGS: clear to auscultation and percussion with normal breathing effort HEART: regular rate & rhythm and no murmurs and no lower extremity edema Musculoskeletal: no cyanosis of digits and no clubbing  PSYCH: alert & oriented x 3, fluent speech NEURO: no focal motor/sensory deficits  LABORATORY DATA:  I have reviewed the data as listed    Latest Ref Rng & Units 11/23/2022   12:10 PM 11/01/2022    3:28 PM 06/08/2022   10:43 AM  CBC  WBC 4.0 - 10.5 K/uL 32.5  33.2 Repeated and verified X2.  28.4   Hemoglobin 13.0 - 17.0 g/dL 15.4  16.0  15.7   Hematocrit 39.0 - 52.0 % 47.2  50.2  47.7   Platelets 150 - 400 K/uL 212  225.0  214        Latest Ref Rng & Units 11/23/2022   12:10 PM 11/01/2022    3:28 PM 06/08/2022   10:43 AM  CMP  Glucose 70 - 99 mg/dL 102  112  124   BUN 6 - 20 mg/dL 19  23  17   $ Creatinine 0.61 - 1.24 mg/dL 1.11  1.28  0.88   Sodium 135 - 145 mmol/L  139  141  142   Potassium 3.5 - 5.1 mmol/L 3.8  4.1  4.2   Chloride 98 - 111 mmol/L 106  103  107   CO2 22 - 32 mmol/L 26  29  29   $ Calcium 8.9 - 10.3 mg/dL 9.6  10.2  10.0   Total Protein 6.5 - 8.1 g/dL 7.2   7.3   Total Bilirubin 0.3 - 1.2 mg/dL 0.4   0.3   Alkaline Phos 38 - 126 U/L 46   57   AST 15 - 41 U/L 15   17   ALT 0 - 44 U/L 15   20     RADIOGRAPHIC STUDIES:  No results found.  ASSESSMENT & PLAN James Parrish 57 y.o. male with medical history significant for CLL who presents for a follow up visit.  # CLL, Rai Stage 0 -- Findings at this time most consistent with a CLL Rai stage 0 --Prognostic panel showed del 11 predominately (small population of deletion 13 noted as well). IgVH negative, ZAP 70 positive  --Labs show white blood cell count 32.5, hemoglobin 15.4, MCV 76.9, and platelets 212 --No indication for treatment at this time as patient's blood counts are stable and he is otherwise asymptomatic --RTC in 6 months time to continue monitoring.   No orders of the defined types were placed in this encounter.   All questions were answered. The patient knows to call the clinic with any problems, questions or concerns.  I have spent a total of 25 minutes minutes of face-to-face and non-face-to-face time, preparing to see the patient,  performing a medically appropriate examination, counseling and educating the patient, documenting clinical information in the electronic health  record, and care coordination.   Dede Query PA-C Dept of Hematology and [place] at [facility] Phone: [phone]   11/23/2022 4:39 PM  Kristian Covey BD, Norm Parcel, Caligaris-Cappio F, Dighiero G, Dhner H, [place], [place], [place] E, Chiorazzi N, [place], Rai KR, [place], Eich[place]st B, O'Brien S, Rob[place] T, Seymour JF, [place] TJ. iwCLL guidelines for diagnosis, indications for treatment, response assessment, and supportive management of CLL.  Blood. 2018 Jun 21;131(25):2745-2760.  Active disease should be clearly documented to initiate therapy. At least 1 of the following criteria should be met.  1) Evidence of progressive marrow failure as manifested by the development of, or worsening of, anemia and/or thrombocytopenia. Cutoff levels of Hb <10 g/dL or platelet counts <100  109/L are generally regarded as indication for treatment. However, in some patients, platelet counts <100  109/L may remain stable over a long period; this situation does not automatically require therapeutic intervention. 2) Massive (ie, ?6 cm below the left costal margin) or progressive or symptomatic splenomegaly. 3) Massive nodes (ie, ?10 cm in longest diameter) or progressive or symptomatic lymphadenopathy. 4) Progressive lymphocytosis with an increase of ?50% over a 91-monthperiod, or lymphocyte doubling time (LDT) <6 months. LDT can be obtained by linear regression extrapolation of absolute lymphocyte counts obtained at intervals of 2 weeks over an observation period of 2 to 3 months; patients with initial blood lymphocyte counts <30  109/L may require a longer observation period to determine the LDT. Factors contributing to lymphocytosis other than CLL (eg, infections, steroid administration) should be excluded. 5) Autoimmune complications including anemia or thrombocytopenia poorly responsive to corticosteroids. 6) Symptomatic or functional extranodal involvement (eg, skin, kidney, lung, spine). Disease-related symptoms as defined by any of the following: Unintentional weight loss ?10% within the previous 6 months. Significant fatigue (ie, ECOG performance scale 2 or worse; cannot work or unable to perform usual activities). Fevers ?100.34F or 38.0C for 2 or more weeks without evidence of infection. Night sweats for ?1 month without evidence of infection.

## 2022-11-24 ENCOUNTER — Encounter: Payer: Self-pay | Admitting: Hematology and Oncology

## 2022-11-24 ENCOUNTER — Encounter: Payer: Self-pay | Admitting: Internal Medicine

## 2022-11-26 NOTE — Telephone Encounter (Signed)
Noted MyChart portal message has not yet been read by Princella Pellegrini.  Called [phone] (home) leaving message with portal information and this nurse direct phone extension.  Awaiting return call or correct form.

## 2022-11-27 ENCOUNTER — Other Ambulatory Visit: Payer: Self-pay | Admitting: Internal Medicine

## 2022-12-04 ENCOUNTER — Encounter: Payer: Self-pay | Admitting: Hematology and Oncology

## 2022-12-04 ENCOUNTER — Ambulatory Visit (HOSPITAL_COMMUNITY): Payer: 59 | Attending: Internal Medicine

## 2022-12-04 VITALS — Ht 74.0 in | Wt 266.0 lb

## 2022-12-04 DIAGNOSIS — R0609 Other forms of dyspnea: Secondary | ICD-10-CM | POA: Diagnosis not present

## 2022-12-04 DIAGNOSIS — R9431 Abnormal electrocardiogram [ECG] [EKG]: Secondary | ICD-10-CM | POA: Diagnosis not present

## 2022-12-04 LAB — MYOCARDIAL PERFUSION IMAGING
LV dias vol: 132 mL (ref 62–150)
LV sys vol: 57 mL
Nuc Stress EF: 57 %
Peak HR: 111 {beats}/min
Rest HR: 86 {beats}/min
Rest Nuclear Isotope Dose: 11 mCi
SDS: 0
SRS: 0
SSS: 0
ST Depression (mm): 0 mm
Stress Nuclear Isotope Dose: 32.4 mCi
TID: 1.05

## 2022-12-04 MED ORDER — TECHNETIUM TC 99M TETROFOSMIN IV KIT
32.4000 | PACK | Freq: Once | INTRAVENOUS | Status: AC | PRN
  Administered 2022-12-04: 32.4 via INTRAVENOUS

## 2022-12-04 MED ORDER — REGADENOSON 0.4 MG/5ML IV SOLN
0.4000 mg | Freq: Once | INTRAVENOUS | Status: AC
  Administered 2022-12-04: 0.4 mg via INTRAVENOUS

## 2022-12-04 MED ORDER — TECHNETIUM TC 99M TETROFOSMIN IV KIT
11.0000 | PACK | Freq: Once | INTRAVENOUS | Status: AC | PRN
  Administered 2022-12-04: 11 via INTRAVENOUS

## 2022-12-05 ENCOUNTER — Encounter: Payer: Self-pay | Admitting: Internal Medicine

## 2022-12-05 ENCOUNTER — Ambulatory Visit: Payer: 59 | Admitting: Internal Medicine

## 2022-12-05 VITALS — BP 136/88 | HR 93 | Ht 74.0 in | Wt 276.0 lb

## 2022-12-05 DIAGNOSIS — E119 Type 2 diabetes mellitus without complications: Secondary | ICD-10-CM | POA: Diagnosis not present

## 2022-12-05 DIAGNOSIS — Z794 Long term (current) use of insulin: Secondary | ICD-10-CM

## 2022-12-05 LAB — POCT GLUCOSE (DEVICE FOR HOME USE): POC Glucose: 200 mg/dl — AB (ref 70–99)

## 2022-12-05 MED ORDER — INSULIN PEN NEEDLE 29G X 5MM MISC: 1.0000 | Freq: Every day | 3 refills | Status: DC

## 2022-12-05 MED ORDER — DAPAGLIFLOZIN PROPANEDIOL 10 MG PO TABS: 10.0000 mg | ORAL_TABLET | Freq: Every day | ORAL | 0 refills | Status: DC

## 2022-12-05 MED ORDER — TOUJEO MAX SOLOSTAR 300 UNIT/ML ~~LOC~~ SOPN: 34.0000 [IU] | PEN_INJECTOR | Freq: Every day | SUBCUTANEOUS | 3 refills | Status: DC

## 2022-12-05 MED ORDER — SEMAGLUTIDE (2 MG/DOSE) 8 MG/3ML ~~LOC~~ SOPN: 2.0000 mg | PEN_INJECTOR | SUBCUTANEOUS | 3 refills | Status: DC

## 2022-12-05 MED ORDER — METFORMIN HCL 1000 MG PO TABS: 1000.0000 mg | ORAL_TABLET | Freq: Two times a day (BID) | ORAL | 3 refills | Status: DC

## 2022-12-05 NOTE — Patient Instructions (Signed)
-   Increase Ozempic 2 mg weekly  - Continue Toujeo  34 units daily  - Continue Metformin 1000 mg, 1 tablet twice daily  - Continue Farxiga 10 mg , 1 tablet with Breakfast     HOW TO TREAT LOW BLOOD SUGARS (Blood sugar LESS THAN 70 MG/DL) Please follow the RULE OF 15 for the treatment of hypoglycemia treatment (when your (blood sugars are less than 70 mg/dL)   STEP 1: Take 15 grams of carbohydrates when your blood sugar is low, which includes:  3-4 GLUCOSE TABS  OR 3-4 OZ OF JUICE OR REGULAR SODA OR ONE TUBE OF GLUCOSE GEL    STEP 2: RECHECK blood sugar in 15 MINUTES STEP 3: If your blood sugar is still low at the 15 minute recheck --> then, go back to STEP 1 and treat AGAIN with another 15 grams of carbohydrates.

## 2022-12-05 NOTE — Progress Notes (Signed)
Name: James Parrish  Age/ Sex: 57 y.o., male   MRN/ DOB: [id number], April 16, 1966     PCP: Janith Lima, MD   Reason for Endocrinology Evaluation: Type 2 Diabetes Mellitus  Initial Endocrine Consultative Visit: 07/13/2019    PATIENT IDENTIFIER: Mr. JAQUESE Parrish is a 57 y.o. male with a past medical history of T2Dm, HTN and Dyslipidemia, CLL (12/2021). The patient has followed with Endocrinology clinic since 07/13/2019 for consultative assistance with management of his diabetes.  DIABETIC HISTORY:  Mr. Burck was diagnosed with DM prior to 2010, was initially on oral glycemic agents, insulin started in 2013. His hemoglobin A1c has ranged from 7.2% in 2018, peaking at 8.8% in 2019.  On his initial visit to our clinic, his A1c 7.7% . He was on lantus, victoza, metformin and Jardiance. We switched Victoza to Ozempic.    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 is here for a  follow up on diabetes.  He checks his blood sugars 1 times . The patient has not had hypoglycemic episodes since the last clinic visit.   He had a follow-up with his PCP on 11/01/2022 Was seen by pulmonary for OSA 07/26/2022   He was diagnosed with CLL 12/2021, follows up with oncology, no treatment necessary at this time Patient has been noted with fluctuating energy level as well as exertional dyspnea with palpitations that is only noted at home  He had a stress test yesterday, awaiting results  Denies nausea , vomiting or diarrhea        HOME DIABETES REGIMEN:  Ozempic 1.0 mg weekly ( Fridays )  Farxiga 10 mg daily  Metformin 1000 mg Twice daily  Toujeo  34 units daily      DIABETIC COMPLICATIONS: Microvascular complications:     Denies: CKD, retinopathy, neuropathy  Last eye exam: Completed 10/2021   Macrovascular complications:    Denies: CAD, PVD, CVA  METER DOWNLOAD SUMMARY: n/a     HISTORY:  Past Medical History:  Past Medical History:  Diagnosis Date   Complication of anesthesia    hard to wake up    Diabetes mellitus without complication ([place])    ED (erectile dysfunction)    History of COVID-19 04/2019   Hyperlipidemia    Hypertension    Sleep apnea    uses CPAP nightly   Past Surgical History:  Past Surgical History:  Procedure Laterality Date   BRONCHIAL NEEDLE ASPIRATION BIOPSY  05/29/2021   Procedure: BRONCHIAL NEEDLE ASPIRATION BIOPSIES;  Surgeon: Garner Nash, DO;  Location: [place] [facility];  Service: Pulmonary;;   COLONOSCOPY     finger surgery Right    Index   KNEE SURGERY Left    KNEE SURGERY Right    ORIF PATELLA Right    REVERSE SHOULDER ARTHROPLASTY Right 08/31/2020   Procedure: REVERSE SHOULDER ARTHROPLASTY;  Surgeon: Hiram Gash, MD;  Location: [place];  Service: Orthopedics;  Laterality: Right;   SHOULDER ARTHROSCOPY WITH ROTATOR CUFF REPAIR Left 02/04/2020   Procedure: SHOULDER ARTHROSCOPY WITH ROTATOR CUFF REPAIR WITH BICEP TENODESIS;  Surgeon: Hiram Gash, MD;  Location: [place];  Service: Orthopedics;  Laterality: Left;   VIDEO BRONCHOSCOPY WITH ENDOBRONCHIAL ULTRASOUND Left 05/29/2021  Procedure: VIDEO BRONCHOSCOPY WITH ENDOBRONCHIAL ULTRASOUND;  Surgeon: Garner Nash, DO;  Location: [place];  Service: Pulmonary;  Laterality: Left;   Social History:  reports that he quit smoking about 2 years ago. His smoking use included cigarettes. He has a 37.50 pack-year smoking history. He has been exposed to tobacco smoke. He has never used smokeless tobacco. He reports that he does not currently use alcohol. He reports that he does not use drugs. Family History:  Family History  Problem Relation Age of Onset   Cancer Mother         breast cancer    Hypertension Mother    Diabetes Mother    Venous thrombosis Son    Colon cancer Neg Hx    Esophageal cancer Neg Hx    Stomach cancer Neg Hx    Rectal cancer Neg Hx      HOME MEDICATIONS: Allergies as of 12/05/2022   No Known Allergies      Medication List        Accurate as of December 05, 2022 11:08 AM. If you have any questions, ask your nurse or doctor.          amLODipine 10 MG tablet Commonly known as: NORVASC Take 1 tablet (10 mg total) by mouth at bedtime.   atorvastatin 10 MG tablet Commonly known as: LIPITOR TAKE ONE TABLET BY MOUTH DAILY   Farxiga 10 MG Tabs tablet Generic drug: dapagliflozin propanediol TAKE 1 TABLET BY MOUTH DAILY BEFORE BREAKFAST   glucose blood test strip Use as instructed to test blood sugar 2 times daily E11.65    Onetouch strips   indapamide 1.25 MG tablet Commonly known as: LOZOL Take 1 tablet (1.25 mg total) by mouth daily.   losartan 100 MG tablet Commonly known as: COZAAR Take 1 tablet (100 mg total) by mouth daily.   metFORMIN 1000 MG tablet Commonly known as: GLUCOPHAGE TAKE ONE TABLET BY MOUTH TWICE A DAY WITH A MEAL   multivitamin with minerals Tabs tablet Take 1 tablet by mouth daily.   nebivolol 10 MG tablet Commonly known as: BYSTOLIC Take 1 tablet (10 mg total) by mouth daily.   Ozempic (1 MG/DOSE) 4 MG/3ML Sopn Generic drug: Semaglutide (1 MG/DOSE) Inject 1 mg into the skin once a week.   spironolactone 50 MG tablet Commonly known as: ALDACTONE Take 1 tablet (50 mg total) by mouth daily.   tadalafil 20 MG tablet Commonly known as: CIALIS TAKE ONE TABLET BY MOUTH DAILY AS NEEDED FOR ERECTILE DYSFUNCTION   Toujeo Max SoloStar 300 UNIT/ML Solostar Pen Generic drug: insulin glargine (2 Unit Dial) INJECT 34 UNITS INTO THE SKIN DAILY IN THE AFTERNOON         OBJECTIVE:   Vital Signs: BP 136/88 (BP Location: Left Arm, Patient Position: Sitting, Cuff Size: Large)   Pulse 93    Ht 6' 2"$  (1.88 m)   Wt 276 lb (125.2 kg)   SpO2 95%   BMI 35.44 kg/m    Wt Readings from Last 3 Encounters:  12/05/22 276 lb (125.2 kg)  12/04/22 266 lb (120.7 kg)  11/23/22 266 lb 14.4 oz (121.1 kg)     Exam: General: Pt appears well and is in NAD  Lungs: Clear with good BS bilat   Heart: RRR   Abdomen:  soft, nontender  Extremities: No pretibial edema.   Neuro: MS is good with appropriate affect, pt is alert and Ox3    DM foot exam: 12/05/2022 The skin of the  feet is intact without sores or ulcerations. The pedal pulses are 2+ on right and 2+ on left. The sensation is intact to a screening 5.07, 10 gram monofilament bilaterally     DATA REVIEWED:  Lab Results  Component Value Date   HGBA1C 7.4 (H) 11/01/2022   HGBA1C 7.0 (A) 03/02/2022   HGBA1C 6.8 (A) 09/22/2021    Latest Reference Range & Units 11/01/22 15:28  Sodium 135 - 145 mEq/L 141  Potassium 3.5 - 5.1 mEq/L 4.1  Chloride 96 - 112 mEq/L 103  CO2 19 - 32 mEq/L 29  Glucose 70 - 99 mg/dL 112 (H)  BUN 6 - 23 mg/dL 23  Creatinine 0.40 - 1.50 mg/dL 1.28  Calcium 8.4 - 10.5 mg/dL 10.2  GFR >60.00 mL/min 61.54  Pro B Natriuretic peptide (BNP) 0.0 - 100.0 pg/mL 3.0    Latest Reference Range & Units 11/01/22 15:28  Total CHOL/HDL Ratio  4  Cholesterol 0 - 200 mg/dL 119  HDL Cholesterol >39.00 mg/dL 30.50 (L)  Direct LDL mg/dL 63.0  MICROALB/CREAT RATIO 0.0 - 30.0 mg/g 0.6  NonHDL  88.06  Triglycerides 0.0 - 149.0 mg/dL 225.0 (H)  VLDL 0.0 - 40.0 mg/dL 45.0 (H)    ASSESSMENT / PLAN / RECOMMENDATIONS:   1) Type 2 Diabetes Mellitus,Sub- Optimally controlled, Without  complications - Most recent A1c of 7.4 %. Goal A1c < 7.0 %.    -Patient has been noted with worsening glycemic control -He has been down lately with stress and exertion with CLL -I have recommended increasing Ozempic, he is motivated to continue with lifestyle changes   MEDICATIONS: -Increase Ozempic 2 mg weekly  -Continue Farxiga 10 mg  daily  mg daily  -Continue Metformin 1000 mg Twice daily  -Continue Toujeo 34 units daily    EDUCATION / INSTRUCTIONS: BG monitoring instructions: Patient is instructed to check his blood sugars 1 time a day. Call [facility] if: BG persistently < 70  I reviewed the Rule of 15 for the treatment of hypoglycemia in detail with the patient. Literature supplied.       F/U in 6 months    Signed electronically by: Mack Guise, MD  [facility]  [facility] [address] [place], [place] [address] Phone: [phone] FAX: [phone]   CC: Janith Lima, MD [place] [address] Phone: [phone]  Fax: [phone]  Return to Endocrinology clinic as below: Future Appointments  Date Time Provider [place]  12/05/2022 11:10 AM Juanmiguel Defelice, Melanie Crazier, MD [facility] None  01/28/2023 10:00 AM Janith Lima, MD [facility] None  05/24/2023 10:45 AM [facility] None  05/24/2023 11:20 AM Orson Slick, MD [facility] None

## 2022-12-07 ENCOUNTER — Ambulatory Visit: Payer: Commercial Managed Care - PPO | Admitting: Hematology and Oncology

## 2022-12-07 ENCOUNTER — Other Ambulatory Visit: Payer: Commercial Managed Care - PPO

## 2022-12-17 ENCOUNTER — Telehealth: Payer: Self-pay | Admitting: *Deleted

## 2022-12-17 NOTE — Telephone Encounter (Signed)
Connected with CHRSITOPHER CHASEY, [phone] (home) regarding [employer] Short-term Disability form, [id number].  Advised ROI needed to process.  Asked for clarification of return to work. "Short-term disability is no longer needed.  I have returned to work." No further actions performed or required by this nurse.  Advised claim request to be shredded.

## 2022-12-31 ENCOUNTER — Other Ambulatory Visit: Payer: Self-pay | Admitting: Internal Medicine

## 2022-12-31 DIAGNOSIS — I10 Essential (primary) hypertension: Secondary | ICD-10-CM

## 2022-12-31 DIAGNOSIS — E119 Type 2 diabetes mellitus without complications: Secondary | ICD-10-CM

## 2023-01-09 ENCOUNTER — Other Ambulatory Visit: Payer: Self-pay | Admitting: Internal Medicine

## 2023-01-09 DIAGNOSIS — N5201 Erectile dysfunction due to arterial insufficiency: Secondary | ICD-10-CM

## 2023-01-14 ENCOUNTER — Telehealth: Payer: Self-pay | Admitting: Internal Medicine

## 2023-01-14 NOTE — Telephone Encounter (Addendum)
Patient brought by an Insulin-Treated DM Assessment Form to be completed.  Form is in Dr. Quin Hoop folder in front office.

## 2023-01-16 NOTE — Telephone Encounter (Signed)
Pt called-checking status Insulin-Treated DM Assessment form. Please advise

## 2023-01-17 NOTE — Telephone Encounter (Signed)
Notified pt forms-is completed and sign-ready to be pick-up at the front office.

## 2023-01-18 ENCOUNTER — Other Ambulatory Visit: Payer: Self-pay | Admitting: Internal Medicine

## 2023-01-20 ENCOUNTER — Encounter: Payer: Self-pay | Admitting: Internal Medicine

## 2023-01-23 ENCOUNTER — Ambulatory Visit: Payer: 59 | Admitting: Pulmonary Disease

## 2023-01-26 LAB — HM DIABETES EYE EXAM

## 2023-01-27 ENCOUNTER — Other Ambulatory Visit: Payer: Self-pay | Admitting: Internal Medicine

## 2023-01-27 DIAGNOSIS — I10 Essential (primary) hypertension: Secondary | ICD-10-CM

## 2023-01-27 DIAGNOSIS — E119 Type 2 diabetes mellitus without complications: Secondary | ICD-10-CM

## 2023-01-28 ENCOUNTER — Telehealth: Payer: Self-pay

## 2023-01-28 ENCOUNTER — Encounter: Payer: Self-pay | Admitting: Internal Medicine

## 2023-01-28 ENCOUNTER — Ambulatory Visit: Payer: 59 | Admitting: Internal Medicine

## 2023-01-28 VITALS — BP 142/92 | HR 90 | Temp 98.2°F | Resp 16 | Ht 74.0 in | Wt 272.0 lb

## 2023-01-28 DIAGNOSIS — E291 Testicular hypofunction: Secondary | ICD-10-CM

## 2023-01-28 DIAGNOSIS — I10 Essential (primary) hypertension: Secondary | ICD-10-CM | POA: Diagnosis not present

## 2023-01-28 DIAGNOSIS — E119 Type 2 diabetes mellitus without complications: Secondary | ICD-10-CM | POA: Diagnosis not present

## 2023-01-28 DIAGNOSIS — Z794 Long term (current) use of insulin: Secondary | ICD-10-CM | POA: Diagnosis not present

## 2023-01-28 DIAGNOSIS — C9110 Chronic lymphocytic leukemia of B-cell type not having achieved remission: Secondary | ICD-10-CM | POA: Diagnosis not present

## 2023-01-28 LAB — CBC WITH DIFFERENTIAL/PLATELET
Basophils Absolute: 0 10*3/uL (ref 0.0–0.1)
Basophils Relative: 0.1 % (ref 0.0–3.0)
Eosinophils Absolute: 0.1 10*3/uL (ref 0.0–0.7)
Eosinophils Relative: 0.3 % (ref 0.0–5.0)
HCT: 44.9 % (ref 39.0–52.0)
Hemoglobin: 14.4 g/dL (ref 13.0–17.0)
Lymphocytes Relative: 84.3 % — ABNORMAL HIGH (ref 12.0–46.0)
Lymphs Abs: 26.8 10*3/uL — ABNORMAL HIGH (ref 0.7–4.0)
MCHC: 32 g/dL (ref 30.0–36.0)
MCV: 77.5 fl — ABNORMAL LOW (ref 78.0–100.0)
Monocytes Absolute: 0.8 10*3/uL (ref 0.1–1.0)
Monocytes Relative: 2.5 % — ABNORMAL LOW (ref 3.0–12.0)
Neutro Abs: 3.8 10*3/uL (ref 1.4–7.7)
Neutrophils Relative %: 12 % — ABNORMAL LOW (ref 43.0–77.0)
Platelets: 215 10*3/uL (ref 150.0–400.0)
RBC: 5.79 Mil/uL (ref 4.22–5.81)
RDW: 15.4 % (ref 11.5–15.5)
WBC: 31.4 10*3/uL (ref 4.0–10.5)

## 2023-01-28 LAB — BASIC METABOLIC PANEL
BUN: 17 mg/dL (ref 6–23)
CO2: 25 mEq/L (ref 19–32)
Calcium: 9.3 mg/dL (ref 8.4–10.5)
Chloride: 105 mEq/L (ref 96–112)
Creatinine, Ser: 1.06 mg/dL (ref 0.40–1.50)
GFR: 77.04 mL/min (ref >60.00)
Glucose, Bld: 132 mg/dL — ABNORMAL HIGH (ref 70–99)
Potassium: 3.7 mEq/L (ref 3.5–5.1)
Sodium: 140 mEq/L (ref 135–145)

## 2023-01-28 LAB — HEMOGLOBIN A1C: Hgb A1c MFr Bld: 7.4 % — ABNORMAL HIGH (ref 4.6–6.5)

## 2023-01-28 NOTE — Patient Instructions (Signed)
Hypertension, Adult High blood pressure (hypertension) is when the force of blood pumping through the arteries is too strong. The arteries are the blood vessels that carry blood from the heart throughout the body. Hypertension forces the heart to work harder to pump blood and may cause arteries to become narrow or stiff. Untreated or uncontrolled hypertension can lead to a heart attack, heart failure, a stroke, kidney disease, and other problems. A blood pressure reading consists of a higher number over a lower number. Ideally, your blood pressure should be below 120/80. The first ("top") number is called the systolic pressure. It is a measure of the pressure in your arteries as your heart beats. The second ("bottom") number is called the diastolic pressure. It is a measure of the pressure in your arteries as the heart relaxes. What are the causes? The exact cause of this condition is not known. There are some conditions that result in high blood pressure. What increases the risk? Certain factors may make you more likely to develop high blood pressure. Some of these risk factors are under your control, including: Smoking. Not getting enough exercise or physical activity. Being overweight. Having too much fat, sugar, calories, or salt (sodium) in your diet. Drinking too much alcohol. Other risk factors include: Having a personal history of heart disease, diabetes, high cholesterol, or kidney disease. Stress. Having a family history of high blood pressure and high cholesterol. Having obstructive sleep apnea. Age. The risk increases with age. What are the signs or symptoms? High blood pressure may not cause symptoms. Very high blood pressure (hypertensive crisis) may cause: Headache. Fast or irregular heartbeats (palpitations). Shortness of breath. Nosebleed. Nausea and vomiting. Vision changes. Severe chest pain, dizziness, and seizures. How is this diagnosed? This condition is diagnosed by  measuring your blood pressure while you are seated, with your arm resting on a flat surface, your legs uncrossed, and your feet flat on the floor. The cuff of the blood pressure monitor will be placed directly against the skin of your upper arm at the level of your heart. Blood pressure should be measured at least twice using the same arm. Certain conditions can cause a difference in blood pressure between your right and left arms. If you have a high blood pressure reading during one visit or you have normal blood pressure with other risk factors, you may be asked to: Return on a different day to have your blood pressure checked again. Monitor your blood pressure at home for 1 week or longer. If you are diagnosed with hypertension, you may have other blood or imaging tests to help your health care provider understand your overall risk for other conditions. How is this treated? This condition is treated by making healthy lifestyle changes, such as eating healthy foods, exercising more, and reducing your alcohol intake. You may be referred for counseling on a healthy diet and physical activity. Your health care provider may prescribe medicine if lifestyle changes are not enough to get your blood pressure under control and if: Your systolic blood pressure is above 130. Your diastolic blood pressure is above 80. Your personal target blood pressure may vary depending on your medical conditions, your age, and other factors. Follow these instructions at home: Eating and drinking  Eat a diet that is high in fiber and potassium, and low in sodium, added sugar, and fat. An example of this eating plan is called the DASH diet. DASH stands for Dietary Approaches to Stop Hypertension. To eat this way: Eat   plenty of fresh fruits and vegetables. Try to fill one half of your plate at each meal with fruits and vegetables. Eat whole grains, such as whole-wheat pasta, brown rice, or whole-grain bread. Fill about one  fourth of your plate with whole grains. Eat or drink low-fat dairy products, such as skim milk or low-fat yogurt. Avoid fatty cuts of meat, processed or cured meats, and poultry with skin. Fill about one fourth of your plate with lean proteins, such as fish, chicken without skin, beans, eggs, or tofu. Avoid pre-made and processed foods. These tend to be higher in sodium, added sugar, and fat. Reduce your daily sodium intake. Many people with hypertension should eat less than 1,500 mg of sodium a day. Do not drink alcohol if: Your health care provider tells you not to drink. You are pregnant, may be pregnant, or are planning to become pregnant. If you drink alcohol: Limit how much you have to: 0-1 drink a day for women. 0-2 drinks a day for men. Know how much alcohol is in your drink. In the [place] one drink equals one 12 oz bottle of beer (355 mL), one 5 oz glass of wine (148 mL), or one 1 oz glass of hard liquor (44 mL). Lifestyle  Work with your health care provider to maintain a healthy body weight or to lose weight. Ask what an ideal weight is for you. Get at least 30 minutes of exercise that causes your heart to beat faster (aerobic exercise) most days of the week. Activities may include walking, swimming, or biking. Include exercise to strengthen your muscles (resistance exercise), such as Pilates or lifting weights, as part of your weekly exercise routine. Try to do these types of exercises for 30 minutes at least 3 days a week. Do not use any products that contain nicotine or tobacco. These products include cigarettes, chewing tobacco, and vaping devices, such as e-cigarettes. If you need help quitting, ask your health care provider. Monitor your blood pressure at home as told by your health care provider. Keep all follow-up visits. This is important. Medicines Take over-the-counter and prescription medicines only as told by your health care provider. Follow directions carefully. Blood  pressure medicines must be taken as prescribed. Do not skip doses of blood pressure medicine. Doing this puts you at risk for problems and can make the medicine less effective. Ask your health care provider about side effects or reactions to medicines that you should watch for. Contact a health care provider if you: Think you are having a reaction to a medicine you are taking. Have headaches that keep coming back (recurring). Feel dizzy. Have swelling in your ankles. Have trouble with your vision. Get help right away if you: Develop a severe headache or confusion. Have unusual weakness or numbness. Feel faint. Have severe pain in your chest or abdomen. Vomit repeatedly. Have trouble breathing. These symptoms may be an emergency. Get help right away. Call 911. Do not wait to see if the symptoms will go away. Do not drive yourself to the hospital. Summary Hypertension is when the force of blood pumping through your arteries is too strong. If this condition is not controlled, it may put you at risk for serious complications. Your personal target blood pressure may vary depending on your medical conditions, your age, and other factors. For most people, a normal blood pressure is less than 120/80. Hypertension is treated with lifestyle changes, medicines, or a combination of both. Lifestyle changes include losing weight, eating a healthy,   low-sodium diet, exercising more, and limiting alcohol. This information is not intended to replace advice given to you by your health care provider. Make sure you discuss any questions you have with your health care provider. Document Revised: 08/08/2021 Document Reviewed: 08/08/2021 Elsevier Patient Education  2023 [employer].  

## 2023-01-28 NOTE — Telephone Encounter (Signed)
Patient Advocate Encounter   Received notification from pt msgs that prior authorization is required for [facility] Max SoloStar 300UNIT/ML pen-injectors  Submitted: 01/28/23 Key [id number]  Status is pending

## 2023-01-28 NOTE — Telephone Encounter (Signed)
Toujeo needs a PA  °

## 2023-01-28 NOTE — Progress Notes (Signed)
Subjective:  Patient ID: James Parrish, male    DOB: 25-Dec-1965  Age: 57 y.o. MRN: [id number]  CC: Hypertension and Diabetes   HPI [name] presents for f/up ----  He is active and denies chest pain, shortness of breath, diaphoresis, or edema.  Outpatient Medications Prior to Visit  Medication Sig Dispense Refill   atorvastatin (LIPITOR) 10 MG tablet TAKE 1 TABLET BY MOUTH DAILY 30 tablet 3   FARXIGA 10 MG TABS tablet TAKE 1 TABLET BY MOUTH DAILY BEFORE BREAKFAST 30 tablet 3   glucose blood test strip Use as instructed to test blood sugar 2 times daily E11.65    Onetouch strips 100 each 12   indapamide (LOZOL) 1.25 MG tablet Take 1 tablet (1.25 mg total) by mouth daily. 90 tablet 0   insulin glargine, 2 Unit Dial, (TOUJEO MAX SOLOSTAR) 300 UNIT/ML Solostar Pen Inject 34 Units into the skin daily in the afternoon. 6 mL 3   Insulin Pen Needle 29G X MISC 1 Device by Does not apply route daily in the afternoon. 100 each 3   losartan (COZAAR) 100 MG tablet TAKE 1 TABLET BY MOUTH DAILY 90 tablet 0   metFORMIN (GLUCOPHAGE) 1000 MG tablet Take 1 tablet (1,000 mg total) by mouth 2 (two) times daily with a meal. 180 tablet 3   Multiple Vitamin (MULTIVITAMIN WITH MINERALS) TABS tablet Take 1 tablet by mouth daily.     nebivolol (BYSTOLIC) 10 MG tablet Take 1 tablet (10 mg total) by mouth daily. 90 tablet 0   Semaglutide, 2 MG/DOSE, 8 MG/3ML SOPN Inject 2 mg as directed once a week. 9 mL 3   spironolactone (ALDACTONE) 50 MG tablet Take 1 tablet (50 mg total) by mouth daily. 90 tablet 0   tadalafil (CIALIS) 20 MG tablet TAKE 1 TABLET BY MOUTH DAILY AS NEEDED FOR ERECTILE DYSFUNCTION 10 tablet 2   amLODipine (NORVASC) 10 MG tablet Take 1 tablet (10 mg total) by mouth at bedtime. 90 tablet 0   No facility-administered medications prior to visit.    ROS Review of Systems  Constitutional:  Positive for unexpected weight change (wt gain). Negative for chills, diaphoresis and fatigue.   HENT: Negative.    Eyes: Negative.   Respiratory:  Negative for cough, chest tightness, shortness of breath and wheezing.   Cardiovascular:  Negative for chest pain, palpitations and leg swelling.  Gastrointestinal:  Negative for abdominal pain, diarrhea, nausea and vomiting.  Endocrine: Negative.   Genitourinary: Negative.  Negative for difficulty urinating.  Musculoskeletal: Negative.  Negative for arthralgias and myalgias.  Skin: Negative.   Neurological:  Negative for dizziness, weakness and light-headedness.  Hematological:  Negative for adenopathy. Does not bruise/bleed easily.  Psychiatric/Behavioral: Negative.      Objective:  BP (!) 142/92 (BP Location: Left Arm, Patient Position: Sitting, Cuff Size: Normal)   Pulse 90   Temp 98.2 F (36.8 C) (Oral)   Resp 16   Ht 6\' 2"  (1.88 m)   Wt 272 lb (123.4 kg)   SpO2 96%   BMI 34.92 kg/m   BP Readings from Last 3 Encounters:  01/28/23 (!) 142/92  12/05/22 136/88  11/23/22 (!) 134/91    Wt Readings from Last 3 Encounters:  01/28/23 272 lb (123.4 kg)  12/05/22 276 lb (125.2 kg)  12/04/22 266 lb (120.7 kg)    Physical Exam Vitals reviewed.  Constitutional:      Appearance: He is obese. He is not ill-appearing.  HENT:  Nose: Nose normal.     Mouth/Throat:     Mouth: Mucous membranes are moist.  Eyes:     General: No scleral icterus.    Conjunctiva/sclera: Conjunctivae normal.  Cardiovascular:     Rate and Rhythm: Normal rate and regular rhythm.     Heart sounds: No murmur heard. Pulmonary:     Effort: Pulmonary effort is normal.     Breath sounds: No stridor. No wheezing, rhonchi or rales.  Abdominal:     General: Abdomen is flat.     Palpations: There is no mass.     Tenderness: There is no abdominal tenderness. There is no guarding.     Hernia: No hernia is present.  Musculoskeletal:     Cervical back: Neck supple.  Lymphadenopathy:     Cervical: No cervical adenopathy.  Skin:    General: Skin is  warm and dry.     Findings: No rash.  Neurological:     General: No focal deficit present.     Mental Status: He is alert.  Psychiatric:        Mood and Affect: Mood normal.        Behavior: Behavior normal.     Lab Results  Component Value Date   WBC 31.4 Repeated and verified X2. (HH) 01/28/2023   HGB 14.4 01/28/2023   HCT 44.9 01/28/2023   PLT 215.0 01/28/2023   GLUCOSE 132 (H) 01/28/2023   CHOL 119 11/01/2022   TRIG 225.0 (H) 11/01/2022   HDL 30.50 (L) 11/01/2022   LDLDIRECT 63.0 11/01/2022   LDLCALC 47 09/22/2021   ALT 15 11/23/2022   AST 15 11/23/2022   NA 140 01/28/2023   K 3.7 01/28/2023   CL 105 01/28/2023   CREATININE 1.06 01/28/2023   BUN 17 01/28/2023   CO2 25 01/28/2023   TSH 2.130 12/01/2021   PSA 0.36 03/15/2022   HGBA1C 7.4 (H) 01/28/2023   MICROALBUR <0.7 11/01/2022    CT CHEST LUNG CA SCREEN LOW DOSE W/O CM  Result Date: 02/16/2022 CLINICAL DATA:  Ex-smoker, quitting 2 years ago. Thirty pack-year history. History of chronic lymphocytic leukemia, recently diagnosed without therapy yet. EXAM: CT CHEST WITHOUT CONTRAST LOW-DOSE FOR LUNG CANCER SCREENING TECHNIQUE: Multidetector CT imaging of the chest was performed following the standard protocol without IV contrast. RADIATION DOSE REDUCTION: This exam was performed according to the departmental dose-optimization program which includes automated exposure control, adjustment of the mA and/or kV according to patient size and/or use of iterative reconstruction technique. COMPARISON:  02/06/2021 PET.  Lung cancer screening CT 01/04/2021 FINDINGS: Cardiovascular: Aortic atherosclerosis. Borderline cardiomegaly, without pericardial effusion. Mediastinum/Nodes: Suspect left thyroidectomy. Right axillary index node measures 1.9 cm on 10/02 versus 2.1 cm on the prior. The middle mediastinal mass on the prior exam has resolved or been resected. Right paratracheal index node measures 1.1 cm on 23/2 and is similar in size on  the prior. Hilar regions poorly evaluated without intravenous contrast. Lungs/Pleura: No pleural fluid. Right-sided pulmonary nodules of maximally volume derived equivalent diameter 2.6 mm. Upper Abdomen: Moderate hepatic steatosis. Colonic stool burden suggests constipation. Normal imaged portions of the spleen, stomach, pancreas, gallbladder, adrenal glands, kidneys. Index gastrohepatic ligament node measures 1.8 cm on 63/2 versus 1.6 cm on the prior exam (when remeasured). Musculoskeletal: Mild left-sided gynecomastia is not significantly changed. Right shoulder arthroplasty. IMPRESSION: 1. Lung-RADS 2, benign appearance or behavior. Continue annual screening with low-dose chest CT without contrast in 12 months. 2. Relatively similar thoracic and upper abdominal adenopathy.  The dominant middle mediastinal mass has either resolved or been resected and most likely was resected as part of an interval left thyroidectomy. 3. Hepatic steatosis. 4. Possible constipation. 5. Aortic Atherosclerosis (ICD10-I70.0). Electronically Signed   By: Jeronimo Greaves M.D.   On: 02/16/2022 13:41   Assessment & Plan:  CLL (chronic lymphocytic leukemia)- His white cell count is stable. -     CBC with Differential/Platelet; Future  Hypogonadism male- His H&H are normal. -     CBC with Differential/Platelet; Future  Type 2 diabetes mellitus without complication, with long-term current use of insulin- He has not quite achieved his A1c goal.  I have recommended lifestyle modifications. -     Basic metabolic panel; Future -     Hemoglobin A1c; Future  Primary hypertension- His blood pressure is not at goal.  Will continue the antihypertensives and have asked him to improve his lifestyle modifications. -     Basic metabolic panel; Future -     amLODIPine Besylate; Take 1 tablet (10 mg total) by mouth at bedtime.  Dispense: 90 tablet; Refill: 1     Follow-up: Return in about 4 months (around 05/30/2023).  Sanda Linger, MD

## 2023-01-29 ENCOUNTER — Encounter: Payer: Self-pay | Admitting: Internal Medicine

## 2023-01-30 MED ORDER — AMLODIPINE BESYLATE 10 MG PO TABS
10.0000 mg | ORAL_TABLET | Freq: Every day | ORAL | 1 refills | Status: DC
Start: 2023-01-30 — End: 2023-11-14

## 2023-01-31 NOTE — Telephone Encounter (Signed)
Patient Advocate Encounter  Received a fax from [employer] regarding Prior Authorization for [employer] 300UNIT/ML pen-injectors.   Authorization has been DENIED due to

## 2023-02-01 ENCOUNTER — Other Ambulatory Visit: Payer: Self-pay | Admitting: Internal Medicine

## 2023-02-01 MED ORDER — LANTUS SOLOSTAR 100 UNIT/ML ~~LOC~~ SOPN: 34.0000 [IU] | PEN_INJECTOR | Freq: Every day | SUBCUTANEOUS | 6 refills | Status: DC

## 2023-02-03 ENCOUNTER — Other Ambulatory Visit: Payer: Self-pay | Admitting: Acute Care

## 2023-02-03 DIAGNOSIS — Z122 Encounter for screening for malignant neoplasm of respiratory organs: Secondary | ICD-10-CM

## 2023-02-03 DIAGNOSIS — Z87891 Personal history of nicotine dependence: Secondary | ICD-10-CM

## 2023-02-18 ENCOUNTER — Other Ambulatory Visit: Payer: Self-pay | Admitting: Internal Medicine

## 2023-02-18 ENCOUNTER — Ambulatory Visit (HOSPITAL_BASED_OUTPATIENT_CLINIC_OR_DEPARTMENT_OTHER): Payer: 59 | Admitting: Pulmonary Disease

## 2023-02-18 ENCOUNTER — Encounter (HOSPITAL_BASED_OUTPATIENT_CLINIC_OR_DEPARTMENT_OTHER): Payer: Self-pay | Admitting: Pulmonary Disease

## 2023-02-18 DIAGNOSIS — I10 Essential (primary) hypertension: Secondary | ICD-10-CM

## 2023-02-22 ENCOUNTER — Ambulatory Visit
Admission: RE | Admit: 2023-02-22 | Discharge: 2023-02-22 | Disposition: A | Discharge status: Home / Self Care | Payer: 59 | Source: Ambulatory Visit | Attending: Acute Care | Admitting: Acute Care

## 2023-02-22 DIAGNOSIS — Z87891 Personal history of nicotine dependence: Secondary | ICD-10-CM

## 2023-02-22 DIAGNOSIS — Z122 Encounter for screening for malignant neoplasm of respiratory organs: Secondary | ICD-10-CM

## 2023-02-25 ENCOUNTER — Encounter: Payer: Self-pay | Admitting: Internal Medicine

## 2023-02-25 ENCOUNTER — Other Ambulatory Visit: Payer: Self-pay | Admitting: Internal Medicine

## 2023-02-25 DIAGNOSIS — N5201 Erectile dysfunction due to arterial insufficiency: Secondary | ICD-10-CM

## 2023-02-25 MED ORDER — TADALAFIL 20 MG PO TABS: ORAL_TABLET | ORAL | 2 refills | Status: DC

## 2023-03-14 ENCOUNTER — Other Ambulatory Visit: Payer: Self-pay

## 2023-03-14 ENCOUNTER — Telehealth: Payer: Self-pay | Admitting: Acute Care

## 2023-03-14 DIAGNOSIS — Z122 Encounter for screening for malignant neoplasm of respiratory organs: Secondary | ICD-10-CM

## 2023-03-14 DIAGNOSIS — Z87891 Personal history of nicotine dependence: Secondary | ICD-10-CM

## 2023-03-14 NOTE — Telephone Encounter (Signed)
Results to PCP and new order placed for annual 2025 LDCT

## 2023-03-14 NOTE — Telephone Encounter (Signed)
I have called the patient with the results of his low-dose screening CT.  I explained that his scan was read as a lung RADS 2, within normal parameters. Patient just needs a 64-month follow-up which will be due in May 2025 There was notation of Increased thoracic and upper abdominal adenopathy, consistent with progressive lymphoproliferative process.  Patient is seen by oncology for CLL by Dr. Leonides Schanz and Georga Kaufmann PA. I have called and left a voicemail for Karena Addison with the results of the CT scan in addition I have sent a staff message to both Karena Addison and Dr. Leonides Schanz. I did explain to the patient that I wanted to make sure that they were aware as this was a change and shows potential progressive lymphoproliferative process.  He verbalized understanding and had no further questions at completion of the call. Denise 16-month follow-up, please let Dr. Yetta Barre know 52-month follow-up however I have let oncology know about the change in the progress of lymphoproliferative process noted on the scan. Thanks so much

## 2023-04-21 ENCOUNTER — Encounter: Payer: Self-pay | Admitting: Hematology and Oncology

## 2023-04-21 ENCOUNTER — Encounter: Payer: Self-pay | Admitting: Internal Medicine

## 2023-04-22 NOTE — Telephone Encounter (Signed)
Message left for ROE OKERSON, [phone] (home) to connect with (SW) H.I.M. Office [phone] opt 2 to request medical records or have [employer] fax request to [facility] H.I.M directly fax no.: [phone].  [place] Authorization form for use and disclosure is required per H.I.P.A.A.  Call this nurse if any questions.  Called spouse upon reading to call her first.  Disclosed above information, provided fax and phone numbers for Vernona Rieger to write then reviewed how to request records via MyChart patient portal.  First log in, select menu, select document center,  then select requested records.  He may need to print or use a USB by selecting visit records or the other three options.  Call (SW) H.I.M. for any other questions or needs.  Currently denies questions.

## 2023-05-05 ENCOUNTER — Other Ambulatory Visit: Payer: Self-pay | Admitting: Internal Medicine

## 2023-05-05 DIAGNOSIS — N5201 Erectile dysfunction due to arterial insufficiency: Secondary | ICD-10-CM

## 2023-05-05 DIAGNOSIS — I10 Essential (primary) hypertension: Secondary | ICD-10-CM

## 2023-05-12 ENCOUNTER — Other Ambulatory Visit: Payer: Self-pay | Admitting: Internal Medicine

## 2023-05-12 DIAGNOSIS — E119 Type 2 diabetes mellitus without complications: Secondary | ICD-10-CM

## 2023-05-12 DIAGNOSIS — I10 Essential (primary) hypertension: Secondary | ICD-10-CM

## 2023-05-24 ENCOUNTER — Inpatient Hospital Stay: Payer: 59

## 2023-05-24 ENCOUNTER — Inpatient Hospital Stay: Payer: 59 | Admitting: Hematology and Oncology

## 2023-05-24 ENCOUNTER — Other Ambulatory Visit: Payer: Self-pay

## 2023-05-24 ENCOUNTER — Other Ambulatory Visit: Payer: Self-pay | Admitting: Hematology and Oncology

## 2023-05-24 VITALS — BP 145/90 | HR 88 | Temp 97.3°F | Resp 16 | Wt 273.2 lb

## 2023-05-24 DIAGNOSIS — Z803 Family history of malignant neoplasm of breast: Secondary | ICD-10-CM | POA: Insufficient documentation

## 2023-05-24 DIAGNOSIS — D72820 Lymphocytosis (symptomatic): Secondary | ICD-10-CM

## 2023-05-24 DIAGNOSIS — R5383 Other fatigue: Secondary | ICD-10-CM | POA: Diagnosis not present

## 2023-05-24 DIAGNOSIS — C9110 Chronic lymphocytic leukemia of B-cell type not having achieved remission: Secondary | ICD-10-CM

## 2023-05-24 DIAGNOSIS — Z87891 Personal history of nicotine dependence: Secondary | ICD-10-CM | POA: Diagnosis not present

## 2023-05-24 LAB — CMP (CANCER CENTER ONLY)
ALT: 14 U/L (ref 0–44)
AST: 14 U/L — ABNORMAL LOW (ref 15–41)
Albumin: 4.3 g/dL (ref 3.5–5.0)
Alkaline Phosphatase: 52 U/L (ref 38–126)
Anion gap: 6 (ref 5–15)
BUN: 18 mg/dL (ref 6–20)
CO2: 27 mmol/L (ref 22–32)
Calcium: 9 mg/dL (ref 8.9–10.3)
Chloride: 108 mmol/L (ref 98–111)
Creatinine: 1.01 mg/dL (ref 0.61–1.24)
GFR, Estimated: >60 mL/min (ref >60)
Glucose, Bld: 151 mg/dL — ABNORMAL HIGH (ref 70–99)
Potassium: 4 mmol/L (ref 3.5–5.1)
Sodium: 141 mmol/L (ref 135–145)
Total Bilirubin: 0.4 mg/dL (ref 0.3–1.2)
Total Protein: 6.5 g/dL (ref 6.5–8.1)

## 2023-05-24 LAB — LACTATE DEHYDROGENASE: LDH: 124 U/L (ref 98–192)

## 2023-05-24 LAB — CBC WITH DIFFERENTIAL (CANCER CENTER ONLY)
Abs Immature Granulocytes: 0.12 10*3/uL — ABNORMAL HIGH (ref 0.00–0.07)
Basophils Absolute: 0.1 10*3/uL (ref 0.0–0.1)
Basophils Relative: 0 %
Eosinophils Absolute: 0.1 10*3/uL (ref 0.0–0.5)
Eosinophils Relative: 0 %
HCT: 45.6 % (ref 39.0–52.0)
Hemoglobin: 14.6 g/dL (ref 13.0–17.0)
Immature Granulocytes: 0 %
Lymphocytes Relative: 88 %
Lymphs Abs: 37.6 10*3/uL — ABNORMAL HIGH (ref 0.7–4.0)
MCH: 24.9 pg — ABNORMAL LOW (ref 26.0–34.0)
MCHC: 32 g/dL (ref 30.0–36.0)
MCV: 77.8 fL — ABNORMAL LOW (ref 80.0–100.0)
Monocytes Absolute: 0.6 10*3/uL (ref 0.1–1.0)
Monocytes Relative: 1 %
Neutro Abs: 4.7 10*3/uL (ref 1.7–7.7)
Neutrophils Relative %: 11 %
Platelet Count: 217 10*3/uL (ref 150–400)
RBC: 5.86 MIL/uL — ABNORMAL HIGH (ref 4.22–5.81)
RDW: 15.2 % (ref 11.5–15.5)
Smear Review: NORMAL
WBC Count: 43.2 10*3/uL — ABNORMAL HIGH (ref 4.0–10.5)
nRBC: 0 % (ref 0.0–0.2)

## 2023-05-24 NOTE — Progress Notes (Signed)
[facility] [phone]   [phone]  PROGRESS NOTE  Patient Care Team: Etta Grandchild, MD as PCP - General (Internal Medicine) Quintella Reichert, MD as PCP - Sleep Medicine (Cardiology)  Hematological/Oncological History # CLL, Rai Stage 0. Prognostic Markers Pending.  01/25/2020: WBC 9.6, Hgb 15.6, MCV 79.1, Plt 241 05/29/2021: WBC 13.3, Hgb 15.4, MCV 76.9, Plt 203 11/16/2021: patient had routine CBC collected, showed WBC 15.9, Hgb 14.8, MCV 76.4, ALC 11.6 12/14/2021: establish care with Dr. Leonides Schanz   Interval History:  James Parrish 57 y.o. male with medical history significant for CLL who presents for a follow up visit. The patient's last visit was on 11/23/2022. In the interim, he denies any changes to his health.   On exam today James Parrish reports he has been well overall in the interim since his last visit 6 months ago.  He reports he does have some occasional bouts of fatigue and he has some discomfort in the right side of his neck.  He thinks it may have something to do with the way he sleeps.  He also has an injury to his left knee.  He notes he has not noticed any enlarged lymph nodes.  His appetite has been good and he feels like he is eating well.  He notes that he has not been having any issues with bleeding, bruising, or dark stools.  He would like to lose weight and feels his ideal weight is below 260.  He denies any B symptoms including fevers, night sweats or weight loss. He has no other complaints.  A full 10 point ROS was otherwise negative.  MEDICAL HISTORY:  Past Medical History:  Diagnosis Date   Complication of anesthesia    hard to wake up    Diabetes mellitus without complication [facility])    ED (erectile dysfunction)    History of COVID-19 04/2019   Hyperlipidemia    Hypertension    Sleep apnea    uses CPAP nightly    SURGICAL HISTORY: Past Surgical History:  Procedure Laterality Date   BRONCHIAL NEEDLE ASPIRATION BIOPSY   05/29/2021   Procedure: BRONCHIAL NEEDLE ASPIRATION BIOPSIES;  Surgeon: Josephine Igo, DO;  Location: [facility];  Service: Pulmonary;;   COLONOSCOPY     finger surgery Right    Index   KNEE SURGERY Left    KNEE SURGERY Right    ORIF PATELLA Right    REVERSE SHOULDER ARTHROPLASTY Right 08/31/2020   Procedure: REVERSE SHOULDER ARTHROPLASTY;  Surgeon: Bjorn Pippin, MD;  Location: [facility];  Service: Orthopedics;  Laterality: Right;   SHOULDER ARTHROSCOPY WITH ROTATOR CUFF REPAIR Left 02/04/2020   Procedure: SHOULDER ARTHROSCOPY WITH ROTATOR CUFF REPAIR WITH BICEP TENODESIS;  Surgeon: Bjorn Pippin, MD;  Location: [facility];  Service: Orthopedics;  Laterality: Left;   VIDEO BRONCHOSCOPY WITH ENDOBRONCHIAL ULTRASOUND Left 05/29/2021   Procedure: VIDEO BRONCHOSCOPY WITH ENDOBRONCHIAL ULTRASOUND;  Surgeon: Josephine Igo, DO;  Location: [facility];  Service: Pulmonary;  Laterality: Left;    SOCIAL HISTORY: Social History   Socioeconomic History   Marital status: Married    Spouse name: Not on file   Number of children: Not on file   Years of education: Not on file   Highest education level: 12th grade  Occupational History   Not on file  Tobacco Use   Smoking status: Former    Current packs/day: 0.00    Average packs/day: 1.5 packs/day for 25.0 years (  37.5 ttl pk-yrs)    Types: Cigarettes    Start date: 04/1995    Quit date: 04/2020    Years since quitting: 3.1    Passive exposure: Past   Smokeless tobacco: Never  Vaping Use   Vaping status: Never Used  Substance and Sexual Activity   Alcohol use: Not Currently    Comment: occasionally   Drug use: Never   Sexual activity: Yes    Partners: Female  Other Topics Concern   Not on file  Social History Narrative   Not on file   Social Determinants of Health   Financial Resource Strain: Medium Risk (01/26/2023)   Overall Financial Resource Strain (CARDIA)    Difficulty of Paying Living  Expenses: Somewhat hard  Food Insecurity: Food Insecurity Present (01/26/2023)   Hunger Vital Sign    Worried About Running Out of Food in the Last Year: Sometimes true    Ran Out of Food in the Last Year: Sometimes true  Transportation Needs: No Transportation Needs (01/26/2023)   PRAPARE - Administrator, Civil Service (Medical): No    Lack of Transportation (Non-Medical): No  Physical Activity: Insufficiently Active (01/26/2023)   Exercise Vital Sign    Days of Exercise per Week: 5 days    Minutes of Exercise per Session: 20 min  Stress: No Stress Concern Present (01/26/2023)   [employer] of Occupational Health - Occupational Stress Questionnaire    Feeling of Stress : Not at all  Social Connections: Socially Isolated (01/26/2023)   Social Connection and Isolation Panel [NHANES]    Frequency of Communication with Friends and Family: Twice a week    Frequency of Social Gatherings with Friends and Family: Never    Attends Religious Services: Never    Database administrator or Organizations: No    Attends Engineer, structural: Not on file    Marital Status: Married  Catering manager Violence: Not on file    FAMILY HISTORY: Family History  Problem Relation Age of Onset   Cancer Mother        breast cancer    Hypertension Mother    Diabetes Mother    Venous thrombosis Son    Colon cancer Neg Hx    Esophageal cancer Neg Hx    Stomach cancer Neg Hx    Rectal cancer Neg Hx     ALLERGIES:  has No Known Allergies.  MEDICATIONS:  Current Outpatient Medications  Medication Sig Dispense Refill   amLODipine (NORVASC) 10 MG tablet Take 1 tablet (10 mg total) by mouth at bedtime. 90 tablet 1   atorvastatin (LIPITOR) 10 MG tablet TAKE 1 TABLET BY MOUTH DAILY 30 tablet 3   FARXIGA 10 MG TABS tablet TAKE 1 TABLET BY MOUTH DAILY BEFORE BREAKFAST 30 tablet 3   glucose blood test strip Use as instructed to test blood sugar 2 times daily E11.65    Onetouch strips  100 each 12   indapamide (LOZOL) 1.25 MG tablet Take 1 tablet (1.25 mg total) by mouth daily. 90 tablet 0   insulin glargine (LANTUS SOLOSTAR) 100 UNIT/ML Solostar Pen Inject 34 Units into the skin daily. 30 mL 6   Insulin Pen Needle 29G X MISC 1 Device by Does not apply route daily in the afternoon. 100 each 3   losartan (COZAAR) 100 MG tablet TAKE 1 TABLET BY MOUTH DAILY 90 tablet 0   metFORMIN (GLUCOPHAGE) 1000 MG tablet Take 1 tablet (1,000 mg total) by mouth 2 (  two) times daily with a meal. 180 tablet 3   Multiple Vitamin (MULTIVITAMIN WITH MINERALS) TABS tablet Take 1 tablet by mouth daily.     nebivolol (BYSTOLIC) 10 MG tablet TAKE 1 TABLET BY MOUTH DAILY 90 tablet 0   Semaglutide, 2 MG/DOSE, 8 MG/3ML SOPN Inject 2 mg as directed once a week. 9 mL 3   spironolactone (ALDACTONE) 50 MG tablet Take 1 tablet (50 mg total) by mouth daily. 90 tablet 0   tadalafil (CIALIS) 20 MG tablet TAKE 1 TABLET BY MOUTH DAILY AS NEEDED FOR ERECTILE DYSFUNCTION 10 tablet 2   No current facility-administered medications for this visit.    REVIEW OF SYSTEMS:   Constitutional: ( - ) fevers, ( - )  chills , ( - ) night sweats Eyes: ( - ) blurriness of vision, ( - ) double vision, ( - ) watery eyes Ears, nose, mouth, throat, and face: ( - ) mucositis, ( - ) sore throat Respiratory: ( - ) cough, ( - ) dyspnea, ( - ) wheezes Cardiovascular: ( - ) palpitation, ( - ) chest discomfort, ( - ) lower extremity swelling Gastrointestinal:  ( - ) nausea, ( - ) heartburn, ( - ) change in bowel habits Skin: ( - ) abnormal skin rashes Lymphatics: ( - ) new lymphadenopathy, ( - ) easy bruising Neurological: ( - ) numbness, ( - ) tingling, ( - ) new weaknesses Behavioral/Psych: ( - ) mood change, ( - ) new changes  All other systems were reviewed with the patient and are negative.  PHYSICAL EXAMINATION: ECOG PERFORMANCE STATUS: 0 - Asymptomatic  Vitals:   05/24/23 1127  BP: (!) 145/90  Pulse: 88  Resp: 16   Temp: (!) 97.3 F (36.3 C)  SpO2: 93%    Filed Weights   05/24/23 1127  Weight: 273 lb 3.2 oz (123.9 kg)     GENERAL: Well-appearing middle-aged African-American male alert, no distress and comfortable SKIN: skin color, texture, turgor are normal, no rashes or significant lesions EYES: conjunctiva are pink and non-injected, sclera clear NECK: supple, non-tender LYMPH: Mildly prominent left-sided cervical lymph nodes, no other overt lymphadenopathy noted on exam LUNGS: clear to auscultation and percussion with normal breathing effort HEART: regular rate & rhythm and no murmurs and no lower extremity edema Musculoskeletal: no cyanosis of digits and no clubbing  PSYCH: alert & oriented x 3, fluent speech NEURO: no focal motor/sensory deficits  LABORATORY DATA:  I have reviewed the data as listed    Latest Ref Rng & Units 05/24/2023   11:02 AM 01/28/2023   10:32 AM 11/23/2022   12:10 PM  CBC  WBC 4.0 - 10.5 K/uL 43.2  31.4 Repeated and verified X2.  32.5   Hemoglobin 13.0 - 17.0 g/dL 38.7  56.4  33.2   Hematocrit 39.0 - 52.0 % 45.6  44.9  47.2   Platelets 150 - 400 K/uL 217  215.0  212        Latest Ref Rng & Units 05/24/2023   11:02 AM 01/28/2023   10:32 AM 11/23/2022   12:10 PM  CMP  Glucose 70 - 99 mg/dL 951  884  166   BUN 6 - 20 mg/dL 18  17  19    Creatinine 0.61 - 1.24 mg/dL 0.63  0.16  0.10   Sodium 135 - 145 mmol/L 141  140  139   Potassium 3.5 - 5.1 mmol/L 4.0  3.7  3.8   Chloride 98 - 111 mmol/L 108  105  106   CO2 22 - 32 mmol/L 27  25  26    Calcium 8.9 - 10.3 mg/dL 9.0  9.3  9.6   Total Protein 6.5 - 8.1 g/dL 6.5   7.2   Total Bilirubin 0.3 - 1.2 mg/dL 0.4   0.4   Alkaline Phos 38 - 126 U/L 52   46   AST 15 - 41 U/L 14   15   ALT 0 - 44 U/L 14   15     RADIOGRAPHIC STUDIES:  No results found.  ASSESSMENT & PLAN James Parrish 57 y.o. male with medical history significant for CLL who presents for a follow up visit.  # CLL, Rai Stage 0 -- Findings at  this time most consistent with a CLL Rai stage 0 --Prognostic panel showed del 11 predominately (small population of deletion 13 noted as well). IgVH negative, ZAP 70 positive  --Labs show white blood cell count 43.2, hemoglobin 14.6, MCV 77.8, and platelets of 217 --No indication for treatment at this time as patient's blood counts are stable and he is otherwise asymptomatic --RTC in 6 months time to continue monitoring.   No orders of the defined types were placed in this encounter.   All questions were answered. The patient knows to call the clinic with any problems, questions or concerns.  I have spent a total of 25 minutes minutes of face-to-face and non-face-to-face time, preparing to see the patient,  performing a medically appropriate examination, counseling and educating the patient, documenting clinical information in the electronic health record, and care coordination.   Ulysees Barns, MD Department of Hematology/Oncology [facility] at [facility] Phone: [phone] Pager: [phone] Email: [email or web address]    05/24/2023 2:39 PM  Mathis Fare BD, Catovsky D, Caligaris-Cappio F, Dighiero G, Dhner H, [place], [place], [place] E, Chiorazzi N, Hunker, Rai KR, [place], Eichhorst B, O'Brien S, Robak T, Seymour JF, Kipps TJ. iwCLL guidelines for diagnosis, indications for treatment, response assessment, and supportive management of CLL. Blood. 2018 Jun 21;131(25):2745-2760.  Active disease should be clearly documented to initiate therapy. At least 1 of the following criteria should be met.  1) Evidence of progressive marrow failure as manifested by the development of, or worsening of, anemia and/or thrombocytopenia. Cutoff levels of Hb <10 g/dL or platelet counts <244  109/L are generally regarded as indication for treatment. However, in some patients, platelet counts <100  109/L may remain stable over a long period; this  situation does not automatically require therapeutic intervention. 2) Massive (ie, ?6 cm below the left costal margin) or progressive or symptomatic splenomegaly. 3) Massive nodes (ie, ?10 cm in longest diameter) or progressive or symptomatic lymphadenopathy. 4) Progressive lymphocytosis with an increase of ?50% over a 69-month period, or lymphocyte doubling time (LDT) <6 months. LDT can be obtained by linear regression extrapolation of absolute lymphocyte counts obtained at intervals of 2 weeks over an observation period of 2 to 3 months; patients with initial blood lymphocyte counts <30  109/L may require a longer observation period to determine the LDT. Factors contributing to lymphocytosis other than CLL (eg, infections, steroid administration) should be excluded. 5) Autoimmune complications including anemia or thrombocytopenia poorly responsive to corticosteroids. 6) Symptomatic or functional extranodal involvement (eg, skin, kidney, lung, spine). Disease-related symptoms as defined by any of the following: Unintentional weight loss ?10% within the previous 6 months. Significant fatigue (ie, ECOG performance scale 2 or worse; cannot work or  unable to perform usual activities). Fevers ?100.92F or 38.0C for 2 or more weeks without evidence of infection. Night sweats for ?1 month without evidence of infection.

## 2023-05-26 ENCOUNTER — Other Ambulatory Visit: Payer: Self-pay | Admitting: Internal Medicine

## 2023-05-26 DIAGNOSIS — I10 Essential (primary) hypertension: Secondary | ICD-10-CM

## 2023-05-28 ENCOUNTER — Other Ambulatory Visit: Payer: Self-pay | Admitting: Internal Medicine

## 2023-05-28 DIAGNOSIS — I10 Essential (primary) hypertension: Secondary | ICD-10-CM

## 2023-05-28 DIAGNOSIS — Z794 Long term (current) use of insulin: Secondary | ICD-10-CM

## 2023-05-28 MED ORDER — NEBIVOLOL HCL 10 MG PO TABS
10.0000 mg | ORAL_TABLET | Freq: Every day | ORAL | 0 refills | Status: DC
Start: 2023-05-28 — End: 2023-10-31

## 2023-05-28 MED ORDER — INDAPAMIDE 1.25 MG PO TABS
1.2500 mg | ORAL_TABLET | Freq: Every day | ORAL | 0 refills | Status: DC
Start: 2023-05-28 — End: 2023-08-25

## 2023-05-28 MED ORDER — LOSARTAN POTASSIUM 100 MG PO TABS
100.0000 mg | ORAL_TABLET | Freq: Every day | ORAL | 0 refills | Status: DC
Start: 2023-05-28 — End: 2023-08-09

## 2023-05-28 MED ORDER — SPIRONOLACTONE 50 MG PO TABS
50.0000 mg | ORAL_TABLET | Freq: Every day | ORAL | 0 refills | Status: DC
Start: 2023-05-28 — End: 2023-08-10

## 2023-05-30 ENCOUNTER — Encounter (INDEPENDENT_AMBULATORY_CARE_PROVIDER_SITE_OTHER): Payer: Self-pay

## 2023-05-31 ENCOUNTER — Other Ambulatory Visit: Payer: Self-pay | Admitting: Internal Medicine

## 2023-06-12 ENCOUNTER — Ambulatory Visit: Payer: 59 | Admitting: Internal Medicine

## 2023-06-28 ENCOUNTER — Encounter: Payer: Self-pay | Admitting: Pharmacist

## 2023-06-30 ENCOUNTER — Other Ambulatory Visit: Payer: Self-pay | Admitting: Internal Medicine

## 2023-06-30 DIAGNOSIS — N5201 Erectile dysfunction due to arterial insufficiency: Secondary | ICD-10-CM

## 2023-07-08 ENCOUNTER — Encounter: Payer: Self-pay | Admitting: Internal Medicine

## 2023-07-08 ENCOUNTER — Ambulatory Visit: Payer: 59 | Admitting: Internal Medicine

## 2023-07-08 VITALS — BP 138/94 | HR 90 | Temp 98.0°F | Resp 16 | Ht 74.0 in | Wt 265.0 lb

## 2023-07-08 DIAGNOSIS — Z794 Long term (current) use of insulin: Secondary | ICD-10-CM

## 2023-07-08 DIAGNOSIS — R9431 Abnormal electrocardiogram [ECG] [EKG]: Secondary | ICD-10-CM

## 2023-07-08 DIAGNOSIS — Z23 Encounter for immunization: Secondary | ICD-10-CM | POA: Insufficient documentation

## 2023-07-08 DIAGNOSIS — Z125 Encounter for screening for malignant neoplasm of prostate: Secondary | ICD-10-CM

## 2023-07-08 DIAGNOSIS — E785 Hyperlipidemia, unspecified: Secondary | ICD-10-CM | POA: Diagnosis not present

## 2023-07-08 DIAGNOSIS — E119 Type 2 diabetes mellitus without complications: Secondary | ICD-10-CM | POA: Diagnosis not present

## 2023-07-08 DIAGNOSIS — I10 Essential (primary) hypertension: Secondary | ICD-10-CM

## 2023-07-08 DIAGNOSIS — Z0001 Encounter for general adult medical examination with abnormal findings: Secondary | ICD-10-CM

## 2023-07-08 LAB — LIPID PANEL
Cholesterol: 103 mg/dL (ref 0–200)
HDL: 27.3 mg/dL — ABNORMAL LOW (ref >39.00)
LDL Cholesterol: 47 mg/dL (ref 0–99)
NonHDL: 76.07
Total CHOL/HDL Ratio: 4
Triglycerides: 145 mg/dL (ref 0.0–149.0)
VLDL: 29 mg/dL (ref 0.0–40.0)

## 2023-07-08 LAB — TROPONIN I (HIGH SENSITIVITY): High Sens Troponin I: 5 ng/L (ref 2–17)

## 2023-07-08 LAB — BRAIN NATRIURETIC PEPTIDE: Pro B Natriuretic peptide (BNP): 11 pg/mL (ref 0.0–100.0)

## 2023-07-08 LAB — HEMOGLOBIN A1C: Hgb A1c MFr Bld: 6.8 % — ABNORMAL HIGH (ref 4.6–6.5)

## 2023-07-08 LAB — TSH: TSH: 3.48 u[IU]/mL (ref 0.35–5.50)

## 2023-07-08 LAB — PSA: PSA: 0.39 ng/mL (ref 0.10–4.00)

## 2023-07-08 MED ORDER — SHINGRIX 50 MCG/0.5ML IM SUSR
0.5000 mL | Freq: Once | INTRAMUSCULAR | 1 refills | Status: AC
Start: 2023-07-08 — End: 2023-07-08

## 2023-07-08 NOTE — Progress Notes (Signed)
Subjective:  Patient ID: James Parrish, male    DOB: 11/06/1965  Age: 57 y.o. MRN: [id number]  CC: Hypertension, Diabetes, and Hyperlipidemia   HPI James Parrish presents for f/up -----  Discussed the use of AI scribe software for clinical note transcription with the patient, who gave verbal consent to proceed.  History of Present Illness   The patient, with a history of Chronic Lymphocytic Leukemia (CLL), presents with fatigue and an abnormal respiratory sound. He describes the fatigue as mild and attributes it to his CLL. He denies associated chest pain or shortness of breath. 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  The patient also reports an abnormal respiratory sound, which he describes as a wheezing sound, but denies any associated pain, cough, or shortness of breath. He notices this sound particularly when it is quiet, such as when his blood pressure is being taken.       Outpatient Medications Prior to Visit  Medication Sig Dispense Refill   amLODipine (NORVASC) 10 MG tablet Take 1 tablet (10 mg total) by mouth at bedtime. 90 tablet 1   atorvastatin (LIPITOR) 10 MG tablet TAKE 1 TABLET BY MOUTH DAILY 30 tablet 3   FARXIGA 10 MG TABS tablet TAKE 1 TABLET BY MOUTH DAILY BEFORE BREAKFAST 30 tablet 3   glucose blood test strip Use as instructed to test blood sugar 2 times daily E11.65    Onetouch strips 100 each 12   indapamide (LOZOL) 1.25 MG tablet Take 1 tablet (1.25 mg total) by mouth daily. 90 tablet 0   insulin glargine (LANTUS SOLOSTAR) 100 UNIT/ML Solostar Pen Inject 34 Units into the skin daily. 30 mL 6   Insulin Pen Needle 29G X MISC 1 Device by Does not apply route daily in the afternoon. 100 each 3   losartan (COZAAR) 100 MG tablet Take 1 tablet (100 mg total) by mouth daily. 90 tablet 0   metFORMIN (GLUCOPHAGE) 1000 MG tablet Take 1 tablet (1,000 mg total) by mouth 2 (two) times daily with a meal. 180 tablet  3   Multiple Vitamin (MULTIVITAMIN WITH MINERALS) TABS tablet Take 1 tablet by mouth daily.     nebivolol (BYSTOLIC) 10 MG tablet Take 1 tablet (10 mg total) by mouth daily. 90 tablet 0   Semaglutide, 2 MG/DOSE, 8 MG/3ML SOPN Inject 2 mg as directed once a week. 9 mL 3   spironolactone (ALDACTONE) 50 MG tablet Take 1 tablet (50 mg total) by mouth daily. 90 tablet 0   tadalafil (CIALIS) 20 MG tablet TAKE 1 TABLET BY MOUTH DAILY AS NEEDED FOR ERECTILE DYSFUNCTION 10 tablet 2   No facility-administered medications prior to visit.    ROS Review of Systems  Constitutional:  Positive for fatigue. Negative for appetite change, diaphoresis and unexpected weight change.  HENT: Negative.  Negative for congestion.   Eyes:  Negative for visual disturbance.  Respiratory:  Negative for cough, chest tightness, shortness of breath and wheezing.        He hears a strange sensation in his chest at rest  Cardiovascular: Negative.  Negative for chest pain, palpitations and leg swelling.  Gastrointestinal:  Negative for abdominal pain, constipation, diarrhea, nausea and vomiting.  Endocrine: Negative.   Genitourinary: Negative.  Negative for difficulty urinating, dysuria and hematuria.  Musculoskeletal: Negative.  Negative for arthralgias, joint swelling and myalgias.  Skin: Negative.   Neurological:  Negative for dizziness.  Hematological:  Negative for adenopathy. Does not bruise/bleed easily.  Psychiatric/Behavioral: Negative.      Objective:  BP (!) 138/94 (BP Location: Right Arm, Patient Position: Sitting, Cuff Size: Large)   Pulse 90   Temp 98 F (36.7 C) (Oral)   Resp 16   Ht 6\' 2"  (1.88 m)   Wt 265 lb (120.2 kg)   SpO2 94%   BMI 34.02 kg/m   BP Readings from Last 3 Encounters:  07/08/23 (!) 138/94  05/24/23 (!) 145/90  01/28/23 (!) 142/92    Wt Readings from Last 3 Encounters:  07/08/23 265 lb (120.2 kg)  05/24/23 273 lb 3.2 oz (123.9 kg)  01/28/23 272 lb (123.4 kg)    Physical  Exam Vitals reviewed.  Constitutional:      Appearance: Normal appearance.  HENT:     Mouth/Throat:     Mouth: Mucous membranes are moist.  Eyes:     General: No scleral icterus.    Conjunctiva/sclera: Conjunctivae normal.  Cardiovascular:     Rate and Rhythm: Normal rate and regular rhythm.     Heart sounds: Normal heart sounds, S1 normal and S2 normal.     No friction rub. No gallop.     Comments: EKG- NSR, 82 bpm NS ST/T wave changes Unchanged Pulmonary:     Effort: Pulmonary effort is normal.     Breath sounds: No stridor. No wheezing, rhonchi or rales.  Abdominal:     General: Abdomen is flat.     Palpations: There is no mass.     Tenderness: There is no abdominal tenderness. There is no guarding.     Hernia: No hernia is present. There is no hernia in the left inguinal area or right inguinal area.  Genitourinary:    Penis: Normal and circumcised.      Testes: Normal.     Epididymis:     Right: Normal.     Left: Normal.     Prostate: Normal. Not enlarged, not tender and no nodules present.     Rectum: Normal. Guaiac result negative. No mass, tenderness, anal fissure, external hemorrhoid or internal hemorrhoid. Normal anal tone.  Musculoskeletal:     Cervical back: Neck supple.     Right lower leg: No edema.     Left lower leg: No edema.  Lymphadenopathy:     Lower Body: No right inguinal adenopathy. No left inguinal adenopathy.  Skin:    General: Skin is warm and dry.  Neurological:     General: No focal deficit present.     Mental Status: He is alert. Mental status is at baseline.  Psychiatric:        Mood and Affect: Mood normal.        Behavior: Behavior normal.     Lab Results  Component Value Date   WBC 43.2 (H) 05/24/2023   HGB 14.6 05/24/2023   HCT 45.6 05/24/2023   PLT 217 05/24/2023   GLUCOSE 151 (H) 05/24/2023   CHOL 103 07/08/2023   TRIG 145.0 07/08/2023   HDL 27.30 (L) 07/08/2023   LDLDIRECT 63.0 11/01/2022   LDLCALC 47 07/08/2023   ALT  14 05/24/2023   AST 14 (L) 05/24/2023   NA 141 05/24/2023   K 4.0 05/24/2023   CL 108 05/24/2023   CREATININE 1.01 05/24/2023   BUN 18 05/24/2023   CO2 27 05/24/2023   TSH 3.48 07/08/2023   PSA 0.39 07/08/2023   HGBA1C 6.8 (H) 07/08/2023   MICROALBUR <0.7 11/01/2022    CT  CHEST LUNG CA SCREEN LOW DOSE W/O CM  Result Date: 02/27/2023 CLINICAL DATA:  30 pack-year smoking history/quit 3 years ago. History of chronic lymphocytic leukemia. Hypertension. Diabetes. * Tracking Code: BO * EXAM: CT CHEST WITHOUT CONTRAST LOW-DOSE FOR LUNG CANCER SCREENING TECHNIQUE: Multidetector CT imaging of the chest was performed following the standard protocol without IV contrast. RADIATION DOSE REDUCTION: This exam was performed according to the departmental dose-optimization program which includes automated exposure control, adjustment of the mA and/or kV according to patient size and/or use of iterative reconstruction technique. COMPARISON:  02/15/2022 FINDINGS: Cardiovascular: Aortic atherosclerosis. Tortuous thoracic aorta. Borderline cardiomegaly. Mediastinum/Nodes: Bilateral axillary and subpectoral adenopathy. Index right axillary node measures 2.0 x 2.7 cm on 14/2 versus 2.0 x 2.9 cm on the prior exam (when remeasured). The largest left axillary node measures 1.6 cm in 15/2 today versus 1.1 cm on the prior exam (when remeasured). Suspect left-sided thyroidectomy. Index low right paratracheal 1.5 cm node on 25/2 measures 1.2 cm previously. Suspect left infrahilar adenopathy, similar at 33/2. Lungs/Pleura: No pleural fluid. Mild centrilobular emphysema. No change in pulmonary nodules of maximally volume derived equivalent diameter 2.6 mm. Upper Abdomen: Mild hepatic steatosis. Normal imaged portions of the spleen common stomach, pancreas, adrenal glands, kidneys. Gastrohepatic ligament nodal mass measures 2.7 x 6.5 cm on 63/2 versus 2.0 by 5.0 cm on the prior exam (when remeasured). Musculoskeletal: Mild left-sided  gynecomastia. Right sided shoulder arthroplasty. No acute osseous abnormality. IMPRESSION: 1. Lung-RADS 2, benign appearance or behavior. Continue annual screening with low-dose chest CT without contrast in 12 months. 2. Increased thoracic and upper abdominal adenopathy, consistent with progressive lymphoproliferative process. 3. Aortic atherosclerosis (ICD10-I70.0) and emphysema (ICD10-J43.9). 4. Hepatic steatosis Electronically Signed   By: Jeronimo Greaves M.D.   On: 02/27/2023 16:53   Assessment & Plan:  Type 2 diabetes mellitus without complication, with long-term current use of insulin (HCC)- His blood sugar is well controlled. -     Hemoglobin A1c; Future  Screening for prostate cancer -     PSA; Future  Hyperlipidemia with target LDL less than 100 - LDL goal achieved. Doing well on the statin  -     TSH; Future -     Lipid panel; Future -     Lipoprotein A (LPA); Future  Primary hypertension- EKG is negative for LVH. BP is well controlled. -     EKG 12-Lead  Abnormal electrocardiogram -     EKG 12-Lead -     Troponin I (High Sensitivity); Future -     Brain natriuretic peptide; Future  Encounter for general adult medical examination with abnormal findings - Exam completed, labs reviewed, vaccines reviewed and updated, cancer screenings addressed, pt ed material was given.   Need for prophylactic vaccination and inoculation against varicella -     Shingrix; Inject 0.5 mLs into the muscle once for 1 dose.  Dispense: 0.5 mL; Refill: 1     Follow-up: Return in about 4 months (around 11/07/2023).  Sanda Linger, MD

## 2023-07-08 NOTE — Patient Instructions (Signed)
Hypertension, Adult High blood pressure (hypertension) is when the force of blood pumping through the arteries is too strong. The arteries are the blood vessels that carry blood from the heart throughout the body. Hypertension forces the heart to work harder to pump blood and may cause arteries to become narrow or stiff. Untreated or uncontrolled hypertension can lead to a heart attack, heart failure, a stroke, kidney disease, and other problems. A blood pressure reading consists of a higher number over a lower number. Ideally, your blood pressure should be below 120/80. The first ("top") number is called the systolic pressure. It is a measure of the pressure in your arteries as your heart beats. The second ("bottom") number is called the diastolic pressure. It is a measure of the pressure in your arteries as the heart relaxes. What are the causes? The exact cause of this condition is not known. There are some conditions that result in high blood pressure. What increases the risk? Certain factors may make you more likely to develop high blood pressure. Some of these risk factors are under your control, including: Smoking. Not getting enough exercise or physical activity. Being overweight. Having too much fat, sugar, calories, or salt (sodium) in your diet. Drinking too much alcohol. Other risk factors include: Having a personal history of heart disease, diabetes, high cholesterol, or kidney disease. Stress. Having a family history of high blood pressure and high cholesterol. Having obstructive sleep apnea. Age. The risk increases with age. What are the signs or symptoms? High blood pressure may not cause symptoms. Very high blood pressure (hypertensive crisis) may cause: Headache. Fast or irregular heartbeats (palpitations). Shortness of breath. Nosebleed. Nausea and vomiting. Vision changes. Severe chest pain, dizziness, and seizures. How is this diagnosed? This condition is diagnosed by  measuring your blood pressure while you are seated, with your arm resting on a flat surface, your legs uncrossed, and your feet flat on the floor. The cuff of the blood pressure monitor will be placed directly against the skin of your upper arm at the level of your heart. Blood pressure should be measured at least twice using the same arm. Certain conditions can cause a difference in blood pressure between your right and left arms. If you have a high blood pressure reading during one visit or you have normal blood pressure with other risk factors, you may be asked to: Return on a different day to have your blood pressure checked again. Monitor your blood pressure at home for 1 week or longer. If you are diagnosed with hypertension, you may have other blood or imaging tests to help your health care provider understand your overall risk for other conditions. How is this treated? This condition is treated by making healthy lifestyle changes, such as eating healthy foods, exercising more, and reducing your alcohol intake. You may be referred for counseling on a healthy diet and physical activity. Your health care provider may prescribe medicine if lifestyle changes are not enough to get your blood pressure under control and if: Your systolic blood pressure is above 130. Your diastolic blood pressure is above 80. Your personal target blood pressure may vary depending on your medical conditions, your age, and other factors. Follow these instructions at home: Eating and drinking  Eat a diet that is high in fiber and potassium, and low in sodium, added sugar, and fat. An example of this eating plan is called the DASH diet. DASH stands for Dietary Approaches to Stop Hypertension. To eat this way: Eat   plenty of fresh fruits and vegetables. Try to fill one half of your plate at each meal with fruits and vegetables. Eat whole grains, such as whole-wheat pasta, brown rice, or whole-grain bread. Fill about one  fourth of your plate with whole grains. Eat or drink low-fat dairy products, such as skim milk or low-fat yogurt. Avoid fatty cuts of meat, processed or cured meats, and poultry with skin. Fill about one fourth of your plate with lean proteins, such as fish, chicken without skin, beans, eggs, or tofu. Avoid pre-made and processed foods. These tend to be higher in sodium, added sugar, and fat. Reduce your daily sodium intake. Many people with hypertension should eat less than 1,500 mg of sodium a day. Do not drink alcohol if: Your health care provider tells you not to drink. You are pregnant, may be pregnant, or are planning to become pregnant. If you drink alcohol: Limit how much you have to: 0-1 drink a day for women. 0-2 drinks a day for men. Know how much alcohol is in your drink. In the [place] one drink equals one 12 oz bottle of beer (355 mL), one 5 oz glass of wine (148 mL), or one 1 oz glass of hard liquor (44 mL). Lifestyle  Work with your health care provider to maintain a healthy body weight or to lose weight. Ask what an ideal weight is for you. Get at least 30 minutes of exercise that causes your heart to beat faster (aerobic exercise) most days of the week. Activities may include walking, swimming, or biking. Include exercise to strengthen your muscles (resistance exercise), such as Pilates or lifting weights, as part of your weekly exercise routine. Try to do these types of exercises for 30 minutes at least 3 days a week. Do not use any products that contain nicotine or tobacco. These products include cigarettes, chewing tobacco, and vaping devices, such as e-cigarettes. If you need help quitting, ask your health care provider. Monitor your blood pressure at home as told by your health care provider. Keep all follow-up visits. This is important. Medicines Take over-the-counter and prescription medicines only as told by your health care provider. Follow directions carefully. Blood  pressure medicines must be taken as prescribed. Do not skip doses of blood pressure medicine. Doing this puts you at risk for problems and can make the medicine less effective. Ask your health care provider about side effects or reactions to medicines that you should watch for. Contact a health care provider if you: Think you are having a reaction to a medicine you are taking. Have headaches that keep coming back (recurring). Feel dizzy. Have swelling in your ankles. Have trouble with your vision. Get help right away if you: Develop a severe headache or confusion. Have unusual weakness or numbness. Feel faint. Have severe pain in your chest or abdomen. Vomit repeatedly. Have trouble breathing. These symptoms may be an emergency. Get help right away. Call 911. Do not wait to see if the symptoms will go away. Do not drive yourself to the hospital. Summary Hypertension is when the force of blood pumping through your arteries is too strong. If this condition is not controlled, it may put you at risk for serious complications. Your personal target blood pressure may vary depending on your medical conditions, your age, and other factors. For most people, a normal blood pressure is less than 120/80. Hypertension is treated with lifestyle changes, medicines, or a combination of both. Lifestyle changes include losing weight, eating a healthy,   low-sodium diet, exercising more, and limiting alcohol. This information is not intended to replace advice given to you by your health care provider. Make sure you discuss any questions you have with your health care provider. Document Revised: 08/08/2021 Document Reviewed: 08/08/2021 [employer] Patient Education  2024 [employer]  

## 2023-07-11 LAB — LIPOPROTEIN A (LPA): Lipoprotein (a): 15 nmol/L (ref <75)

## 2023-08-09 ENCOUNTER — Other Ambulatory Visit: Payer: Self-pay | Admitting: Internal Medicine

## 2023-08-09 DIAGNOSIS — I10 Essential (primary) hypertension: Secondary | ICD-10-CM

## 2023-08-09 DIAGNOSIS — E119 Type 2 diabetes mellitus without complications: Secondary | ICD-10-CM

## 2023-08-10 ENCOUNTER — Other Ambulatory Visit: Payer: Self-pay | Admitting: Internal Medicine

## 2023-08-10 DIAGNOSIS — I10 Essential (primary) hypertension: Secondary | ICD-10-CM

## 2023-08-12 ENCOUNTER — Ambulatory Visit: Payer: 59 | Admitting: Internal Medicine

## 2023-08-25 ENCOUNTER — Other Ambulatory Visit: Payer: Self-pay | Admitting: Internal Medicine

## 2023-08-25 DIAGNOSIS — E119 Type 2 diabetes mellitus without complications: Secondary | ICD-10-CM

## 2023-08-25 DIAGNOSIS — I10 Essential (primary) hypertension: Secondary | ICD-10-CM

## 2023-09-08 ENCOUNTER — Other Ambulatory Visit: Payer: Self-pay | Admitting: Internal Medicine

## 2023-10-01 ENCOUNTER — Other Ambulatory Visit: Payer: Self-pay | Admitting: Internal Medicine

## 2023-10-02 NOTE — Progress Notes (Deleted)
Name: James Parrish  Age/ Sex: 57 y.o., male   MRN/ DOB: [id number], 12-03-1965     PCP: Etta Grandchild, MD   Reason for Endocrinology Evaluation: Type 2 Diabetes Mellitus  Initial Endocrine Consultative Visit: 07/13/2019    PATIENT IDENTIFIER: James Parrish is a 57 y.o. male with a past medical history of T2Dm, HTN and Dyslipidemia, CLL (12/2021). The patient has followed with Endocrinology clinic since 07/13/2019 for consultative assistance with management of his diabetes.  DIABETIC HISTORY:  James Parrish was diagnosed with DM prior to 2010, was initially on oral glycemic agents, insulin started in 2013. His hemoglobin A1c has ranged from 7.2% in 2018, peaking at 8.8% in 2019.  On his initial visit to our clinic, his A1c 7.7% . He was on lantus, victoza, metformin and Jardiance. We switched Victoza to Ozempic.    He is a Naval [employer]    THYROID HISTORY: He is S/P  left thyroidectomy including substernal thyroid 10/19/2021 with pathology report consistent with benign nodular hyperplasia, negative for malignancy   SUBJECTIVE:   During the last visit (12/05/2022): A1c 7.4%    Today (10/02/2023): James Parrish is here for a  follow up on diabetes.  He checks his blood sugars 1 times . The patient has not had hypoglycemic episodes since the last clinic visit.    He was diagnosed with CLL 12/2021, follows up with oncology, no treatment necessary at this time, as patient's blood work is stable Patient has been noted with fluctuating energy level as well as exertional dyspnea with palpitations that is only noted at home  Denies nausea , vomiting or diarrhea        HOME DIABETES REGIMEN:  Ozempic 2 mg weekly ( Fridays )  Farxiga 10 mg daily  Metformin 1000 mg Twice daily  Toujeo  34 units daily      DIABETIC COMPLICATIONS: Microvascular complications:    Denies: CKD, retinopathy, neuropathy  Last eye exam: Completed 10/2021   Macrovascular complications:     Denies: CAD, PVD, CVA  METER DOWNLOAD SUMMARY: n/a     HISTORY:  Past Medical History:  Past Medical History:  Diagnosis Date   Complication of anesthesia    hard to wake up    Diabetes mellitus without complication (HCC)    ED (erectile dysfunction)    History of COVID-19 04/2019   Hyperlipidemia    Hypertension    Sleep apnea    uses CPAP nightly   Past Surgical History:  Past Surgical History:  Procedure Laterality Date   BRONCHIAL NEEDLE ASPIRATION BIOPSY  05/29/2021   Procedure: BRONCHIAL NEEDLE ASPIRATION BIOPSIES;  Surgeon: Josephine Igo, DO;  Location: [facility];  Service: Pulmonary;;   COLONOSCOPY     finger surgery Right    Index   KNEE SURGERY Left    KNEE SURGERY Right    ORIF PATELLA Right    REVERSE SHOULDER ARTHROPLASTY Right 08/31/2020   Procedure: REVERSE SHOULDER ARTHROPLASTY;  Surgeon: Bjorn Pippin, MD;  Location: [facility];  Service: Orthopedics;  Laterality: Right;   SHOULDER ARTHROSCOPY WITH ROTATOR CUFF REPAIR Left 02/04/2020   Procedure: SHOULDER ARTHROSCOPY WITH ROTATOR CUFF REPAIR WITH BICEP TENODESIS;  Surgeon: Bjorn Pippin, MD;  Location: [facility];  Service: Orthopedics;  Laterality: Left;   VIDEO BRONCHOSCOPY WITH ENDOBRONCHIAL ULTRASOUND Left 05/29/2021   Procedure: VIDEO BRONCHOSCOPY WITH ENDOBRONCHIAL ULTRASOUND;  Surgeon: Josephine Igo, DO;  Location: [facility];  Service:  Pulmonary;  Laterality: Left;   Social History:  reports that he quit smoking about 3 years ago. His smoking use included cigarettes. He started smoking about 28 years ago. He has a 37.5 pack-year smoking history. He has been exposed to tobacco smoke. He has never used smokeless tobacco. He reports that he does not currently use alcohol. He reports that he does not use drugs. Family History:  Family History  Problem Relation Age of Onset   Cancer Mother        breast cancer    Hypertension Mother    Diabetes Mother    Venous  thrombosis Son    Colon cancer Neg Hx    Esophageal cancer Neg Hx    Stomach cancer Neg Hx    Rectal cancer Neg Hx      HOME MEDICATIONS: Allergies as of 10/10/2023   No Known Allergies      Medication List        Accurate as of October 02, 2023  4:31 PM. If you have any questions, ask your nurse or doctor.          amLODipine 10 MG tablet Commonly known as: NORVASC Take 1 tablet (10 mg total) by mouth at bedtime.   atorvastatin 10 MG tablet Commonly known as: LIPITOR TAKE 1 TABLET BY MOUTH DAILY   Farxiga 10 MG Tabs tablet Generic drug: dapagliflozin propanediol TAKE 1 TABLET BY MOUTH DAILY BEFORE BREAKFAST   glucose blood test strip Use as instructed to test blood sugar 2 times daily E11.65    Onetouch strips   indapamide 1.25 MG tablet Commonly known as: LOZOL TAKE 1 TABLET BY MOUTH DAILY   Insulin Pen Needle 29G X Misc 1 Device by Does not apply route daily in the afternoon.   Lantus SoloStar 100 UNIT/ML Solostar Pen Generic drug: insulin glargine Inject 34 Units into the skin daily.   losartan 100 MG tablet Commonly known as: COZAAR TAKE 1 TABLET BY MOUTH DAILY   metFORMIN 1000 MG tablet Commonly known as: GLUCOPHAGE Take 1 tablet (1,000 mg total) by mouth 2 (two) times daily with a meal.   multivitamin with minerals Tabs tablet Take 1 tablet by mouth daily.   nebivolol 10 MG tablet Commonly known as: BYSTOLIC Take 1 tablet (10 mg total) by mouth daily.   Semaglutide (2 MG/DOSE) 8 MG/3ML Sopn Inject 2 mg as directed once a week.   spironolactone 50 MG tablet Commonly known as: ALDACTONE TAKE 1 TABLET BY MOUTH DAILY   tadalafil 20 MG tablet Commonly known as: CIALIS TAKE 1 TABLET BY MOUTH DAILY AS NEEDED FOR ERECTILE DYSFUNCTION         OBJECTIVE:   Vital Signs: There were no vitals taken for this visit.   Wt Readings from Last 3 Encounters:  07/08/23 265 lb (120.2 kg)  05/24/23 273 lb 3.2 oz (123.9 kg)  01/28/23 272 lb  (123.4 kg)     Exam: General: Pt appears well and is in NAD  Lungs: Clear with good BS bilat   Heart: RRR   Abdomen:  soft, nontender  Extremities: No pretibial edema.   Neuro: MS is good with appropriate affect, pt is alert and Ox3    DM foot exam: 12/05/2022 The skin of the feet is intact without sores or ulcerations. The pedal pulses are 2+ on right and 2+ on left. The sensation is intact to a screening 5.07, 10 gram monofilament bilaterally     DATA REVIEWED:  Lab Results  Component Value Date   HGBA1C 6.8 (H) 07/08/2023   HGBA1C 7.4 (H) 01/28/2023   HGBA1C 7.4 (H) 11/01/2022    Latest Reference Range & Units 05/24/23 11:02 07/08/23 09:48  Sodium 135 - 145 mmol/L 141   Potassium 3.5 - 5.1 mmol/L 4.0   Chloride 98 - 111 mmol/L 108   CO2 22 - 32 mmol/L 27   Glucose 70 - 99 mg/dL 132 (H)   BUN 6 - 20 mg/dL 18   Creatinine 4.40 - 1.24 mg/dL 1.02   Calcium 8.9 - 72.5 mg/dL 9.0   Anion gap 5 - 15  6   Alkaline Phosphatase 38 - 126 U/L 52   Albumin 3.5 - 5.0 g/dL 4.3   AST 15 - 41 U/L 14 (L)   ALT 0 - 44 U/L 14   Total Protein 6.5 - 8.1 g/dL 6.5   Total Bilirubin 0.3 - 1.2 mg/dL 0.4   GFR, Est Non African American >60 mL/min >60   Pro B Natriuretic peptide (BNP) 0.0 - 100.0 pg/mL  11.0  Total CHOL/HDL Ratio   4  Cholesterol 0 - 200 mg/dL  366  HDL Cholesterol >44.03 mg/dL  47.42 (L)  LDL (calc) 0 - 99 mg/dL  47  NonHDL   59.56  Triglycerides 0.0 - 149.0 mg/dL  387.5  VLDL 0.0 - 64.3 mg/dL  32.9    ASSESSMENT / PLAN / RECOMMENDATIONS:   1) Type 2 Diabetes Mellitus,Sub- Optimally controlled, Without  complications - Most recent A1c of 7.4 %. Goal A1c < 7.0 %.    -Patient has been noted with worsening glycemic control -He has been down lately with stress and exertion with CLL -I have recommended increasing Ozempic, he is motivated to continue with lifestyle changes   MEDICATIONS: -Increase Ozempic 2 mg weekly  -Continue Farxiga 10 mg daily  mg daily   -Continue Metformin 1000 mg Twice daily  -Continue Toujeo 34 units daily    EDUCATION / INSTRUCTIONS: BG monitoring instructions: Patient is instructed to check his blood sugars 1 time a day. Call [facility] if: BG persistently < 70  I reviewed the Rule of 15 for the treatment of hypoglycemia in detail with the patient. Literature supplied.       F/U in 6 months    Signed electronically by: Lyndle Herrlich, MD  [facility]  [facility] [address] [place] [place], [place] [address] Phone: [phone] FAX: [phone]   CC: Etta Grandchild, MD [address] [place] [address] Phone: [phone]  Fax: [phone]  Return to Endocrinology clinic as below: Future Appointments  Date Time Provider Department Center  10/10/2023  7:30 AM [place] Sarin[place], Konrad Dolores, MD [facility] None  11/25/2023 10:45 AM [facility] None  11/25/2023 11:20 AM Jaci Standard, MD [facility] None

## 2023-10-10 ENCOUNTER — Ambulatory Visit: Payer: 59 | Admitting: Internal Medicine

## 2023-10-14 ENCOUNTER — Other Ambulatory Visit: Payer: Self-pay | Admitting: Internal Medicine

## 2023-10-14 DIAGNOSIS — N5201 Erectile dysfunction due to arterial insufficiency: Secondary | ICD-10-CM

## 2023-10-18 ENCOUNTER — Telehealth: Payer: Self-pay | Admitting: *Deleted

## 2023-10-18 ENCOUNTER — Encounter: Payer: Self-pay | Admitting: Hematology and Oncology

## 2023-10-18 NOTE — Telephone Encounter (Signed)
 TCT pt's wife regarding pt's call about itchy all over. Spoke with Wife. Advised to try an antihistamine like zyrtec during the day and benadryl at night. If he is not experiencing any relief by Monday, advised to call back. She voiced understanding.

## 2023-10-31 ENCOUNTER — Other Ambulatory Visit: Payer: Self-pay | Admitting: Internal Medicine

## 2023-10-31 DIAGNOSIS — I10 Essential (primary) hypertension: Secondary | ICD-10-CM

## 2023-11-03 ENCOUNTER — Other Ambulatory Visit: Payer: Self-pay | Admitting: Internal Medicine

## 2023-11-03 DIAGNOSIS — I10 Essential (primary) hypertension: Secondary | ICD-10-CM

## 2023-11-14 ENCOUNTER — Other Ambulatory Visit: Payer: Self-pay | Admitting: Internal Medicine

## 2023-11-14 DIAGNOSIS — I10 Essential (primary) hypertension: Secondary | ICD-10-CM

## 2023-11-18 ENCOUNTER — Encounter: Payer: Self-pay | Admitting: Internal Medicine

## 2023-11-18 ENCOUNTER — Ambulatory Visit: Payer: 59 | Admitting: Internal Medicine

## 2023-11-18 VITALS — BP 126/78 | HR 98 | Wt 257.0 lb

## 2023-11-18 DIAGNOSIS — E119 Type 2 diabetes mellitus without complications: Secondary | ICD-10-CM | POA: Diagnosis not present

## 2023-11-18 DIAGNOSIS — Z794 Long term (current) use of insulin: Secondary | ICD-10-CM | POA: Diagnosis not present

## 2023-11-18 LAB — POCT GLYCOSYLATED HEMOGLOBIN (HGB A1C): Hemoglobin A1C: 6 % — AB (ref 4.0–5.6)

## 2023-11-18 LAB — POCT GLUCOSE (DEVICE FOR HOME USE): POC Glucose: 105 mg/dL — AB (ref 70–99)

## 2023-11-18 MED ORDER — DAPAGLIFLOZIN PROPANEDIOL 10 MG PO TABS: 10.0000 mg | ORAL_TABLET | Freq: Every day | ORAL | 3 refills | Status: DC

## 2023-11-18 MED ORDER — METFORMIN HCL 1000 MG PO TABS: 1000.0000 mg | ORAL_TABLET | Freq: Two times a day (BID) | ORAL | 3 refills | Status: DC

## 2023-11-18 MED ORDER — LANTUS SOLOSTAR 100 UNIT/ML ~~LOC~~ SOPN: 30.0000 [IU] | PEN_INJECTOR | Freq: Every day | SUBCUTANEOUS | 6 refills | Status: DC

## 2023-11-18 MED ORDER — INSULIN PEN NEEDLE 29G X 5MM MISC: 1.0000 | Freq: Every day | 3 refills | Status: DC

## 2023-11-18 MED ORDER — OZEMPIC (2 MG/DOSE) 8 MG/3ML ~~LOC~~ SOPN: 2.0000 mg | PEN_INJECTOR | SUBCUTANEOUS | 3 refills | Status: DC

## 2023-11-18 NOTE — Progress Notes (Signed)
Name: James Parrish  Age/ Sex: 58 y.o., male   MRN/ DOB: [id number], 1966/09/27     PCP: Etta Grandchild, MD   Reason for Endocrinology Evaluation: Type 2 Diabetes Mellitus  Initial Endocrine Consultative Visit: 07/13/2019    PATIENT IDENTIFIER: [name] is a 58 y.o. male with a past medical history of T2Dm, HTN and Dyslipidemia, CLL (12/2021). The patient has followed with Endocrinology clinic since 07/13/2019 for consultative assistance with management of his diabetes.  DIABETIC HISTORY:  James Parrish was diagnosed with DM prior to 2010, was initially on oral glycemic agents, insulin started in 2013. His hemoglobin A1c has ranged from 7.2% in 2018, peaking at 8.8% in 2019.  On his initial visit to our clinic, his A1c 7.7% . He was on lantus, victoza, metformin and Jardiance. We switched Victoza to Ozempic.    He is a Naval [employer]    THYROID HISTORY: He is S/P  left thyroidectomy including substernal thyroid 10/19/2021 with pathology report consistent with benign nodular hyperplasia, negative for malignancy   SUBJECTIVE:   During the last visit (12/05/2022): A1c 7.4%    Today (11/18/2023): James Parrish is here for a  follow up on diabetes.  He has not been checking glucose at home.   Patient follows with orthopedics for left knee pain He was diagnosed with CLL 12/2021, follows up with oncology, no treatment necessary at this time  Denies nausea , vomiting  Denies constipation or diarrhea  Continues with fatigue  Has been exercising by walking      HOME DIABETES REGIMEN:  Ozempic 2 mg weekly ( Fridays )  Farxiga 10 mg daily  Metformin 1000 mg Twice daily  Lantus   34 units daily      DIABETIC COMPLICATIONS: Microvascular complications:    Denies: CKD, retinopathy, neuropathy  Last eye exam: Completed 10/2021   Macrovascular complications:    Denies: CAD, PVD, CVA  METER DOWNLOAD SUMMARY: n/a     HISTORY:  Past Medical History:  Past Medical  History:  Diagnosis Date   Complication of anesthesia    hard to wake up    Diabetes mellitus without complication (HCC)    ED (erectile dysfunction)    History of COVID-19 04/2019   Hyperlipidemia    Hypertension    Sleep apnea    uses CPAP nightly   Past Surgical History:  Past Surgical History:  Procedure Laterality Date   BRONCHIAL NEEDLE ASPIRATION BIOPSY  05/29/2021   Procedure: BRONCHIAL NEEDLE ASPIRATION BIOPSIES;  Surgeon: Josephine Igo, DO;  Location: [facility];  Service: Pulmonary;;   COLONOSCOPY     finger surgery Right    Index   KNEE SURGERY Left    KNEE SURGERY Right    ORIF PATELLA Right    REVERSE SHOULDER ARTHROPLASTY Right 08/31/2020   Procedure: REVERSE SHOULDER ARTHROPLASTY;  Surgeon: Bjorn Pippin, MD;  Location: [facility];  Service: Orthopedics;  Laterality: Right;   SHOULDER ARTHROSCOPY WITH ROTATOR CUFF REPAIR Left 02/04/2020   Procedure: SHOULDER ARTHROSCOPY WITH ROTATOR CUFF REPAIR WITH BICEP TENODESIS;  Surgeon: Bjorn Pippin, MD;  Location: [facility];  Service: Orthopedics;  Laterality: Left;   VIDEO BRONCHOSCOPY WITH ENDOBRONCHIAL ULTRASOUND Left 05/29/2021   Procedure: VIDEO BRONCHOSCOPY WITH ENDOBRONCHIAL ULTRASOUND;  Surgeon: Josephine Igo, DO;  Location: [facility];  Service: Pulmonary;  Laterality: Left;   Social History:  reports that he quit smoking about 3 years ago. His smoking  use included cigarettes. He started smoking about 28 years ago. He has a 37.5 pack-year smoking history. He has been exposed to tobacco smoke. He has never used smokeless tobacco. He reports that he does not currently use alcohol. He reports that he does not use drugs. Family History:  Family History  Problem Relation Age of Onset   Cancer Mother        breast cancer    Hypertension Mother    Diabetes Mother    Venous thrombosis Son    Colon cancer Neg Hx    Esophageal cancer Neg Hx    Stomach cancer Neg Hx    Rectal cancer  Neg Hx      HOME MEDICATIONS: Allergies as of 11/18/2023   No Known Allergies      Medication List        Accurate as of November 18, 2023  2:34 PM. If you have any questions, ask your nurse or doctor.          amLODipine 10 MG tablet Commonly known as: NORVASC TAKE 1 TABLET BY MOUTH AT BEDTIME   atorvastatin 10 MG tablet Commonly known as: LIPITOR TAKE 1 TABLET BY MOUTH DAILY   diclofenac 75 MG EC tablet Commonly known as: VOLTAREN Take 75 mg by mouth 2 (two) times daily.   Farxiga 10 MG Tabs tablet Generic drug: dapagliflozin propanediol TAKE 1 TABLET BY MOUTH DAILY BEFORE BREAKFAST   glucose blood test strip Use as instructed to test blood sugar 2 times daily E11.65    Onetouch strips   indapamide 1.25 MG tablet Commonly known as: LOZOL TAKE 1 TABLET BY MOUTH DAILY   Insulin Pen Needle 29G X Misc 1 Device by Does not apply route daily in the afternoon.   Lantus SoloStar 100 UNIT/ML Solostar Pen Generic drug: insulin glargine Inject 34 Units into the skin daily.   losartan 100 MG tablet Commonly known as: COZAAR TAKE 1 TABLET BY MOUTH DAILY   metFORMIN 1000 MG tablet Commonly known as: GLUCOPHAGE Take 1 tablet (1,000 mg total) by mouth 2 (two) times daily with a meal.   multivitamin with minerals Tabs tablet Take 1 tablet by mouth daily.   nebivolol 10 MG tablet Commonly known as: BYSTOLIC TAKE 1 TABLET BY MOUTH DAILY   Ozempic (2 MG/DOSE) 8 MG/3ML Sopn Generic drug: Semaglutide (2 MG/DOSE) DIAL AND INJECT UNDER THE SKIN 2 MG WEEKLY   spironolactone 50 MG tablet Commonly known as: ALDACTONE TAKE 1 TABLET BY MOUTH DAILY   tadalafil 20 MG tablet Commonly known as: CIALIS TAKE 1 TABLET BY MOUTH DAILY AS NEEDED FOR ERECTILE DYSFUNCTION        OBJECTIVE:   Vital Signs: BP 126/78 (BP Location: Left Arm, Patient Position: Sitting, Cuff Size: Normal)   Pulse 98   Wt 257 lb (116.6 kg)   SpO2 96%   BMI 33.00 kg/m    Wt Readings from  Last 3 Encounters:  11/18/23 257 lb (116.6 kg)  07/08/23 265 lb (120.2 kg)  05/24/23 273 lb 3.2 oz (123.9 kg)     Exam: General: Pt appears well and is in NAD  Lungs: Clear with good BS bilat   Heart: RRR   Abdomen:  soft, nontender  Extremities: No pretibial edema.   Neuro: MS is good with appropriate affect, pt is alert and Ox3    DM foot exam: 11/18/2023 The skin of the feet is intact without sores or ulcerations. The pedal pulses are 2+ on right and 2+  on left. The sensation is intact to a screening 5.07, 10 gram monofilament bilaterally     DATA REVIEWED:  Lab Results  Component Value Date   HGBA1C 6.0 (A) 11/18/2023   HGBA1C 6.8 (H) 07/08/2023   HGBA1C 7.4 (H) 01/28/2023    Latest Reference Range & Units 07/08/23 09:48  Pro B Natriuretic peptide (BNP) 0.0 - 100.0 pg/mL 11.0  Total CHOL/HDL Ratio  4  Cholesterol 0 - 200 mg/dL 696  HDL Cholesterol >29.52 mg/dL 84.13 (L)  LDL (calc) 0 - 99 mg/dL 47  NonHDL  24.40  Triglycerides 0.0 - 149.0 mg/dL 102.7  VLDL 0.0 - 25.3 mg/dL 66.4    Latest Reference Range & Units 05/24/23 11:02  Sodium 135 - 145 mmol/L 141  Potassium 3.5 - 5.1 mmol/L 4.0  Chloride 98 - 111 mmol/L 108  CO2 22 - 32 mmol/L 27  Glucose 70 - 99 mg/dL 403 (H)  BUN 6 - 20 mg/dL 18  Creatinine 4.74 - 2.59 mg/dL 5.63  Calcium 8.9 - 87.5 mg/dL 9.0  Anion gap 5 - 15  6  Alkaline Phosphatase 38 - 126 U/L 52  Albumin 3.5 - 5.0 g/dL 4.3  AST 15 - 41 U/L 14 (L)  ALT 0 - 44 U/L 14  Total Protein 6.5 - 8.1 g/dL 6.5  Total Bilirubin 0.3 - 1.2 mg/dL 0.4  GFR, Est Non African American >60 mL/min >60    In office BG 105 mg/dL   ASSESSMENT / PLAN / RECOMMENDATIONS:   1) Type 2 Diabetes Mellitus,Optimally controlled, Without  complications - Most recent A1c of 6.0 %. Goal A1c < 7.0 %.    -I have praised the patient on improved glycemic control and weight loss -Will decrease insulin as below to prevent hypoglycemia -Encouraged continuation with lifestyle  changes   MEDICATIONS: -Continue  Ozempic 2 mg weekly  -Continue Farxiga 10 mg daily  mg daily  -Continue Metformin 1000 mg Twice daily  -Decrease Lantus 30 units daily    EDUCATION / INSTRUCTIONS: BG monitoring instructions: Patient is instructed to check his blood sugars 1 time a day. Call [facility] if: BG persistently < 70  I reviewed the Rule of 15 for the treatment of hypoglycemia in detail with the patient. Literature supplied.     F/U in 6 months    Signed electronically by: Lyndle Herrlich, MD  [facility]  [facility] [address] [place] [place], [place] [address] Phone: [phone] FAX: [phone]   CC: Etta Grandchild, MD [address] [place] [address] Phone: [phone]  Fax: [phone]  Return to Endocrinology clinic as below: Future Appointments  Date Time Provider Department Center  11/25/2023 10:45 AM [facility] None  11/25/2023 11:20 AM Jaci Standard, MD [facility] None  12/30/2023  2:00 PM Etta Grandchild, MD [facility] None

## 2023-11-18 NOTE — Patient Instructions (Addendum)
-   Continue Ozempic 2 mg weekly  - Continue Metformin 1000 mg, 1 tablet twice daily  - Continue Farxiga 10 mg , 1 tablet with Breakfast  - Decrease Lantus  30 units daily     HOW TO TREAT LOW BLOOD SUGARS (Blood sugar LESS THAN 70 MG/DL) Please follow the RULE OF 15 for the treatment of hypoglycemia treatment (when your (blood sugars are less than 70 mg/dL)   STEP 1: Take 15 grams of carbohydrates when your blood sugar is low, which includes:  3-4 GLUCOSE TABS  OR 3-4 OZ OF JUICE OR REGULAR SODA OR ONE TUBE OF GLUCOSE GEL    STEP 2: RECHECK blood sugar in 15 MINUTES STEP 3: If your blood sugar is still low at the 15 minute recheck --> then, go back to STEP 1 and treat AGAIN with another 15 grams of carbohydrates.

## 2023-11-24 ENCOUNTER — Other Ambulatory Visit: Payer: Self-pay | Admitting: Hematology and Oncology

## 2023-11-24 DIAGNOSIS — C9110 Chronic lymphocytic leukemia of B-cell type not having achieved remission: Secondary | ICD-10-CM

## 2023-11-24 NOTE — Progress Notes (Signed)
 [facility] [phone]   [phone]  PROGRESS NOTE  Patient Care Team: Arcadio Knuckles, MD as PCP - General (Internal Medicine) Jacqueline Matsu, MD as PCP - Sleep Medicine (Cardiology)  Hematological/Oncological History # CLL, Rai Stage 0. Del 11 01/25/2020: WBC 9.6, Hgb 15.6, MCV 79.1, Plt 241 05/29/2021: WBC 13.3, Hgb 15.4, MCV 76.9, Plt 203 11/16/2021: patient had routine CBC collected, showed WBC 15.9, Hgb 14.8, MCV 76.4, ALC 11.6 12/14/2021: establish care with Dr. Rosaline Parrish  05/24/2023: WBC 43.2, Hgb 14.6, MCV 77.8, Plt 217 11/25/2023: WBC 95.9, Hgb 15.4, MCV 77.8, Plt 221   Interval History:  James Parrish 58 y.o. male with medical history significant for CLL who presents for a follow up visit. The patient's last visit was on 05/24/2023. In the interim, he denies any changes to his health.   On exam today James Parrish reports he has been having some a runny nose and fatigue as well as some occasional itching of his skin.  He reports he is not having any other infectious symptoms at this time.  He reports his appetite is been "so-so".  Overall he feels like he is more worn out than usual.  His energy is only about a 6 out of 10 and he has been unintentionally losing weight.  He is down about 12 pounds since September.  He notes that he has not noticed any enlargement of the bumps or lumps in his neck or under his arms..  He denies any B symptoms including fevers, night sweats or weight loss. He has no other complaints.  A full 10 point ROS was otherwise negative.  The bulk of our discussion focused on the results of his blood work.  At this time it appears that his white blood cell count rapidly doubled in the course of 6 months.  This is an indication to start treatment.  We discussed options moving forward including Calquence versus venetoclax /obinutuzumab .  He reports that he would prefer for her to pursue limited duration therapy with obinutuzumab /venetoclax .   Will begin the process of getting this scheduled.  MEDICAL HISTORY:  Past Medical History:  Diagnosis Date   Complication of anesthesia    hard to wake up    Diabetes mellitus without complication [facility])    ED (erectile dysfunction)    History of COVID-19 04/2019   Hyperlipidemia    Hypertension    Sleep apnea    uses CPAP nightly    SURGICAL HISTORY: Past Surgical History:  Procedure Laterality Date   BRONCHIAL NEEDLE ASPIRATION BIOPSY  05/29/2021   Procedure: BRONCHIAL NEEDLE ASPIRATION BIOPSIES;  Surgeon: Prudy Brownie, DO;  Location: [facility];  Service: Pulmonary;;   COLONOSCOPY     finger surgery Right    Index   KNEE SURGERY Left    KNEE SURGERY Right    ORIF PATELLA Right    REVERSE SHOULDER ARTHROPLASTY Right 08/31/2020   Procedure: REVERSE SHOULDER ARTHROPLASTY;  Surgeon: Micheline Ahr, MD;  Location: [facility];  Service: Orthopedics;  Laterality: Right;   SHOULDER ARTHROSCOPY WITH ROTATOR CUFF REPAIR Left 02/04/2020   Procedure: SHOULDER ARTHROSCOPY WITH ROTATOR CUFF REPAIR WITH BICEP TENODESIS;  Surgeon: Micheline Ahr, MD;  Location: [facility];  Service: Orthopedics;  Laterality: Left;   VIDEO BRONCHOSCOPY WITH ENDOBRONCHIAL ULTRASOUND Left 05/29/2021   Procedure: VIDEO BRONCHOSCOPY WITH ENDOBRONCHIAL ULTRASOUND;  Surgeon: Prudy Brownie, DO;  Location: [facility];  Service: Pulmonary;  Laterality: Left;  SOCIAL HISTORY: Social History   Socioeconomic History   Marital status: Married    Spouse name: Not on file   Number of children: Not on file   Years of education: Not on file   Highest education level: 12th grade  Occupational History   Not on file  Tobacco Use   Smoking status: Former    Current packs/day: 0.00    Average packs/day: 1.5 packs/day for 25.0 years (37.5 ttl pk-yrs)    Types: Cigarettes    Start date: 04/1995    Quit date: 04/2020    Years since quitting: 3.6    Passive exposure: Past    Smokeless tobacco: Never  Vaping Use   Vaping status: Never Used  Substance and Sexual Activity   Alcohol use: Not Currently    Comment: occasionally   Drug use: Never   Sexual activity: Yes    Partners: Female  Other Topics Concern   Not on file  Social History Narrative   Not on file   Social Drivers of Health   Financial Resource Strain: Medium Risk (01/26/2023)   Overall Financial Resource Strain (CARDIA)    Difficulty of Paying Living Expenses: Somewhat hard  Food Insecurity: Food Insecurity Present (01/26/2023)   Hunger Vital Sign    Worried About Running Out of Food in the Last Year: Sometimes true    Ran Out of Food in the Last Year: Sometimes true  Transportation Needs: No Transportation Needs (01/26/2023)   PRAPARE - Administrator, Civil Service (Medical): No    Lack of Transportation (Non-Medical): No  Physical Activity: Insufficiently Active (01/26/2023)   Exercise Vital Sign    Days of Exercise per Week: 5 days    Minutes of Exercise per Session: 20 min  Stress: No Stress Concern Present (01/26/2023)   [employer] of Occupational Health - Occupational Stress Questionnaire    Feeling of Stress : Not at all  Social Connections: Socially Isolated (01/26/2023)   Social Connection and Isolation Panel [NHANES]    Frequency of Communication with Friends and Family: Twice a week    Frequency of Social Gatherings with Friends and Family: Never    Attends Religious Services: Never    Database administrator or Organizations: No    Attends Engineer, structural: Not on file    Marital Status: Married  Catering manager Violence: Not on file    FAMILY HISTORY: Family History  Problem Relation Age of Onset   Cancer Mother        breast cancer    Hypertension Mother    Diabetes Mother    Venous thrombosis Son    Colon cancer Neg Hx    Esophageal cancer Neg Hx    Stomach cancer Neg Hx    Rectal cancer Neg Hx     ALLERGIES:  has no known  allergies.  MEDICATIONS:  Current Outpatient Medications  Medication Sig Dispense Refill   amLODipine  (NORVASC ) 10 MG tablet TAKE 1 TABLET BY MOUTH AT BEDTIME 90 tablet 0   atorvastatin  (LIPITOR) 10 MG tablet TAKE 1 TABLET BY MOUTH DAILY 30 tablet 3   dapagliflozin  propanediol (FARXIGA ) 10 MG TABS tablet Take 1 tablet (10 mg total) by mouth daily before breakfast. 90 tablet 3   diclofenac (VOLTAREN) 75 MG EC tablet Take 75 mg by mouth 2 (two) times daily.     glucose blood test strip Use as instructed to test blood sugar 2 times daily E11.65    Onetouch strips  100 each 12   indapamide  (LOZOL ) 1.25 MG tablet TAKE 1 TABLET BY MOUTH DAILY 90 tablet 0   insulin  glargine (LANTUS  SOLOSTAR) 100 UNIT/ML Solostar Pen Inject 30 Units into the skin daily. 30 mL 6   Insulin  Pen Needle 29G X MISC 1 Device by Does not apply route daily in the afternoon. 100 each 3   losartan  (COZAAR ) 100 MG tablet TAKE 1 TABLET BY MOUTH DAILY 90 tablet 0   metFORMIN  (GLUCOPHAGE ) 1000 MG tablet Take 1 tablet (1,000 mg total) by mouth 2 (two) times daily with a meal. 180 tablet 3   Multiple Vitamin (MULTIVITAMIN WITH MINERALS) TABS tablet Take 1 tablet by mouth daily.     nebivolol  (BYSTOLIC ) 10 MG tablet TAKE 1 TABLET BY MOUTH DAILY 90 tablet 0   Semaglutide , 2 MG/DOSE, (OZEMPIC , 2 MG/DOSE,) 8 MG/3ML SOPN 2 mg by Other route once a week. 9 mL 3   spironolactone  (ALDACTONE ) 50 MG tablet TAKE 1 TABLET BY MOUTH DAILY 30 tablet 0   tadalafil  (CIALIS ) 20 MG tablet TAKE 1 TABLET BY MOUTH DAILY AS NEEDED FOR ERECTILE DYSFUNCTION 10 tablet 2   No current facility-administered medications for this visit.    REVIEW OF SYSTEMS:   Constitutional: ( - ) fevers, ( - )  chills , ( - ) night sweats Eyes: ( - ) blurriness of vision, ( - ) double vision, ( - ) watery eyes Ears, nose, mouth, throat, and face: ( - ) mucositis, ( - ) sore throat Respiratory: ( - ) cough, ( - ) dyspnea, ( - ) wheezes Cardiovascular: ( - ) palpitation, (  - ) chest discomfort, ( - ) lower extremity swelling Gastrointestinal:  ( - ) nausea, ( - ) heartburn, ( - ) change in bowel habits Skin: ( - ) abnormal skin rashes Lymphatics: ( - ) new lymphadenopathy, ( - ) easy bruising Neurological: ( - ) numbness, ( - ) tingling, ( - ) new weaknesses Behavioral/Psych: ( - ) mood change, ( - ) new changes  All other systems were reviewed with the patient and are negative.  PHYSICAL EXAMINATION: ECOG PERFORMANCE STATUS: 0 - Asymptomatic  Vitals:   11/25/23 1131  BP: 129/76  Pulse: 97  Resp: 14  SpO2: 97%   Filed Weights   11/25/23 1131  Weight: 253 lb 9.6 oz (115 kg)    GENERAL: Well-appearing middle-aged African-American male alert, no distress and comfortable SKIN: skin color, texture, turgor are normal, no rashes or significant lesions EYES: conjunctiva are pink and non-injected, sclera clear NECK: supple, non-tender LYMPH: Mildly prominent left-sided cervical lymph nodes, no other overt lymphadenopathy noted on exam LUNGS: clear to auscultation and percussion with normal breathing effort HEART: regular rate & rhythm and no murmurs and no lower extremity edema Musculoskeletal: no cyanosis of digits and no clubbing  PSYCH: alert & oriented x 3, fluent speech NEURO: no focal motor/sensory deficits  LABORATORY DATA:  I have reviewed the data as listed    Latest Ref Rng & Units 11/25/2023   10:57 AM 05/24/2023   11:02 AM 01/28/2023   10:32 AM  CBC  WBC 4.0 - 10.5 K/uL 95.9  43.2  31.4 Repeated and verified X2.   Hemoglobin 13.0 - 17.0 g/dL 96.0  45.4  09.8   Hematocrit 39.0 - 52.0 % 49.2  45.6  44.9   Platelets 150 - 400 K/uL 221  217  215.0        Latest Ref Rng & Units 11/25/2023  10:57 AM 05/24/2023   11:02 AM 01/28/2023   10:32 AM  CMP  Glucose 70 - 99 mg/dL 161  096  045   BUN 6 - 20 mg/dL 16  18  17    Creatinine 0.61 - 1.24 mg/dL 4.09  8.11  9.14   Sodium 135 - 145 mmol/L 142  141  140   Potassium 3.5 - 5.1 mmol/L 4.1  4.0   3.7   Chloride 98 - 111 mmol/L 103  108  105   CO2 22 - 32 mmol/L 30  27  25    Calcium  8.9 - 10.3 mg/dL 9.8  9.0  9.3   Total Protein 6.5 - 8.1 g/dL 6.9  6.5    Total Bilirubin 0.0 - 1.2 mg/dL 0.5  0.4    Alkaline Phos 38 - 126 U/L 57  52    AST 15 - 41 U/L 18  14    ALT 0 - 44 U/L 12  14      RADIOGRAPHIC STUDIES:  No results found.  ASSESSMENT & PLAN James Parrish 58 y.o. male with medical history significant for CLL who presents for a follow up visit.  # CLL, Rai Stage 0 -- Findings at this time most consistent with a CLL Rai stage 0 --Prognostic panel showed del 11 predominately (small population of deletion 13 noted as well). IgVH negative, ZAP 70 positive  --Labs show white blood cell count 95.9, Hgb 15.4, MCV 77.8, Plt 221.  --Indication for treatment at this time given rapid rise of lymphocytes and <6 month doubling time.  -- Recommended Calquence versus venetoclax /obinutuzumab .  After discussing the risks and benefits of both options he noted he would prefer the limited duration of therapy offered by venetoclax  and obinutuzumab .  Will plan to start with 1 month obinutuzumab  with the addition of venetoclax  for cycle 2 --RTC for the start of treatment next week.  Orders Placed This Encounter  Procedures   CBC with Differential (Cancer Center Only)    Standing Status:   Future    Expected Date:   12/02/2023    Expiration Date:   12/01/2024   CMP (Cancer Center only)    Standing Status:   Future    Expected Date:   12/02/2023    Expiration Date:   12/01/2024   Uric acid    Standing Status:   Future    Expected Date:   12/02/2023    Expiration Date:   12/01/2024   Lactate dehydrogenase (LDH)    Standing Status:   Future    Expected Date:   12/02/2023    Expiration Date:   12/01/2024   CBC with Differential (Cancer Center Only)    Standing Status:   Future    Expected Date:   12/09/2023    Expiration Date:   12/08/2024   CMP (Cancer Center only)    Standing Status:   Future     Expected Date:   12/09/2023    Expiration Date:   12/08/2024   Uric acid    Standing Status:   Future    Expected Date:   12/09/2023    Expiration Date:   12/08/2024   Lactate dehydrogenase (LDH)    Standing Status:   Future    Expected Date:   12/09/2023    Expiration Date:   12/08/2024   CBC with Differential (Cancer Center Only)    Standing Status:   Future    Expected Date:   12/16/2023    Expiration  Date:   12/15/2024   CMP (Cancer Center only)    Standing Status:   Future    Expected Date:   12/16/2023    Expiration Date:   12/15/2024   Uric acid    Standing Status:   Future    Expected Date:   12/16/2023    Expiration Date:   12/15/2024   Lactate dehydrogenase (LDH)    Standing Status:   Future    Expected Date:   12/16/2023    Expiration Date:   12/15/2024    All questions were answered. The patient knows to call the clinic with any problems, questions or concerns.  I have spent a total of 30 minutes minutes of face-to-face and non-face-to-face time, preparing to see the patient,  performing a medically appropriate examination, counseling and educating the patient, documenting clinical information in the electronic health record, and care coordination.   Rogerio Clay, MD Department of Hematology/Oncology [facility] Phone: [phone] Pager: [phone] Email: [email or web address]   11/25/2023 2:36 PM  Armando Lance BD, Catovsky D, Caligaris-Cappio F, Dighiero G, Dhner H, Hillmen P, Keating M, Montserrat E, Chiorazzi N, Stilgenbauer S, Rai KR, [place], Eichhorst B, O'Brien S, Robak T, Seymour JF, Kipps TJ. iwCLL guidelines for diagnosis, indications for treatment, response assessment, and supportive management of CLL. Blood. 2018 Jun 21;131(25):2745-2760.  Active disease should be clearly documented to initiate therapy. At least 1 of the following criteria should be met.  1) Evidence of progressive marrow failure as manifested by  the development of, or worsening of, anemia and/or thrombocytopenia. Cutoff levels of Hb <10 g/dL or platelet counts <295  109/L are generally regarded as indication for treatment. However, in some patients, platelet counts <100  109/L may remain stable over a long period; this situation does not automatically require therapeutic intervention. 2) Massive (ie, >=6 cm below the left costal margin) or progressive or symptomatic splenomegaly. 3) Massive nodes (ie, >=10 cm in longest diameter) or progressive or symptomatic lymphadenopathy. 4) Progressive lymphocytosis with an increase of >=50% over a 49-month period, or lymphocyte doubling time (LDT) <6 months. LDT can be obtained by linear regression extrapolation of absolute lymphocyte counts obtained at intervals of 2 weeks over an observation period of 2 to 3 months; patients with initial blood lymphocyte counts <30  109/L may require a longer observation period to determine the LDT. Factors contributing to lymphocytosis other than CLL (eg, infections, steroid administration) should be excluded. 5) Autoimmune complications including anemia or thrombocytopenia poorly responsive to corticosteroids. 6) Symptomatic or functional extranodal involvement (eg, skin, kidney, lung, spine). Disease-related symptoms as defined by any of the following: Unintentional weight loss >=10% within the previous 6 months. Significant fatigue (ie, ECOG performance scale 2 or worse; cannot work or unable to perform usual activities). Fevers >=100.76F or 38.0C for 2 or more weeks without evidence of infection. Night sweats for >=1 month without evidence of infection.

## 2023-11-25 ENCOUNTER — Inpatient Hospital Stay: Payer: 59 | Attending: Hematology and Oncology

## 2023-11-25 ENCOUNTER — Inpatient Hospital Stay: Payer: 59 | Admitting: Hematology and Oncology

## 2023-11-25 ENCOUNTER — Telehealth: Payer: Self-pay

## 2023-11-25 VITALS — BP 129/76 | HR 97 | Resp 14 | Wt 253.6 lb

## 2023-11-25 DIAGNOSIS — C9110 Chronic lymphocytic leukemia of B-cell type not having achieved remission: Secondary | ICD-10-CM | POA: Insufficient documentation

## 2023-11-25 DIAGNOSIS — Z87891 Personal history of nicotine dependence: Secondary | ICD-10-CM | POA: Diagnosis not present

## 2023-11-25 DIAGNOSIS — Z803 Family history of malignant neoplasm of breast: Secondary | ICD-10-CM | POA: Diagnosis not present

## 2023-11-25 DIAGNOSIS — R5383 Other fatigue: Secondary | ICD-10-CM | POA: Insufficient documentation

## 2023-11-25 DIAGNOSIS — R634 Abnormal weight loss: Secondary | ICD-10-CM | POA: Insufficient documentation

## 2023-11-25 DIAGNOSIS — D72820 Lymphocytosis (symptomatic): Secondary | ICD-10-CM

## 2023-11-25 LAB — CMP (CANCER CENTER ONLY)
ALT: 12 U/L (ref 0–44)
AST: 18 U/L (ref 15–41)
Albumin: 4.8 g/dL (ref 3.5–5.0)
Alkaline Phosphatase: 57 U/L (ref 38–126)
Anion gap: 9 (ref 5–15)
BUN: 16 mg/dL (ref 6–20)
CO2: 30 mmol/L (ref 22–32)
Calcium: 9.8 mg/dL (ref 8.9–10.3)
Chloride: 103 mmol/L (ref 98–111)
Creatinine: 1.12 mg/dL (ref 0.61–1.24)
GFR, Estimated: >60 mL/min (ref >60)
Glucose, Bld: 113 mg/dL — ABNORMAL HIGH (ref 70–99)
Potassium: 4.1 mmol/L (ref 3.5–5.1)
Sodium: 142 mmol/L (ref 135–145)
Total Bilirubin: 0.5 mg/dL (ref 0.0–1.2)
Total Protein: 6.9 g/dL (ref 6.5–8.1)

## 2023-11-25 LAB — CBC WITH DIFFERENTIAL (CANCER CENTER ONLY)
Abs Immature Granulocytes: 0.2 10*3/uL — ABNORMAL HIGH (ref 0.00–0.07)
Basophils Absolute: 0.1 10*3/uL (ref 0.0–0.1)
Basophils Relative: 0 %
Eosinophils Absolute: 0.1 10*3/uL (ref 0.0–0.5)
Eosinophils Relative: 0 %
HCT: 49.2 % (ref 39.0–52.0)
Hemoglobin: 15.4 g/dL (ref 13.0–17.0)
Immature Granulocytes: 0 %
Lymphocytes Relative: 91 %
Lymphs Abs: 87.8 10*3/uL — ABNORMAL HIGH (ref 0.7–4.0)
MCH: 24.4 pg — ABNORMAL LOW (ref 26.0–34.0)
MCHC: 31.3 g/dL (ref 30.0–36.0)
MCV: 77.8 fL — ABNORMAL LOW (ref 80.0–100.0)
Monocytes Absolute: 2.4 10*3/uL — ABNORMAL HIGH (ref 0.1–1.0)
Monocytes Relative: 3 %
Neutro Abs: 5.3 10*3/uL (ref 1.7–7.7)
Neutrophils Relative %: 6 %
Platelet Count: 221 10*3/uL (ref 150–400)
RBC: 6.32 MIL/uL — ABNORMAL HIGH (ref 4.22–5.81)
RDW: 15.9 % — ABNORMAL HIGH (ref 11.5–15.5)
WBC Count: 95.9 10*3/uL (ref 4.0–10.5)
nRBC: 0 % (ref 0.0–0.2)

## 2023-11-25 LAB — LACTATE DEHYDROGENASE: LDH: 147 U/L (ref 98–192)

## 2023-11-25 NOTE — Progress Notes (Signed)
 START ON PATHWAY REGIMEN - Lymphoma and CLL     Cycle 1: A cycle is 28 days:     Venetoclax      Obinutuzumab      Obinutuzumab      Obinutuzumab    Cycle 2: A cycle is 28 days:     Venetoclax      Venetoclax      Venetoclax      Venetoclax      Obinutuzumab    Cycles 3 through 6: A cycle is 28 days:     Venetoclax      Obinutuzumab    Cycles 7 through 12: A cycle is 28 days:     Venetoclax   **Always confirm dose/schedule in your pharmacy ordering system**  Patient Characteristics: Chronic Lymphocytic Leukemia (CLL), Treatment Indicated, First Line Disease Type: Chronic Lymphocytic Leukemia (CLL) Disease Type: Not Applicable Disease Type: Not Applicable Treatment Indicated<= Treatment Indicated Line of Therapy: First Line Intent of Therapy: Non-Curative / Palliative Intent, Discussed with Patient

## 2023-11-25 NOTE — Telephone Encounter (Signed)
 CRITICAL VALUE STICKER  CRITICAL VALUE:   WBC 95.9  RECEIVER (on-site recipient of call): Arbie Knock, LPN  DATE & TIME NOTIFIED: 11/25/23   11:19  MESSENGER (representative from lab):  Melissa  MD NOTIFIED: Amparo Balk, MD  TIME OF NOTIFICATION:11:19  RESPONSE:

## 2023-11-26 ENCOUNTER — Telehealth: Payer: Self-pay | Admitting: Hematology and Oncology

## 2023-11-26 ENCOUNTER — Other Ambulatory Visit: Payer: Self-pay | Admitting: Internal Medicine

## 2023-11-26 ENCOUNTER — Other Ambulatory Visit: Payer: Self-pay

## 2023-11-26 DIAGNOSIS — I10 Essential (primary) hypertension: Secondary | ICD-10-CM

## 2023-11-27 ENCOUNTER — Other Ambulatory Visit: Payer: Self-pay | Admitting: Internal Medicine

## 2023-11-28 ENCOUNTER — Telehealth: Payer: Self-pay | Admitting: *Deleted

## 2023-11-28 ENCOUNTER — Other Ambulatory Visit: Payer: Self-pay | Admitting: Internal Medicine

## 2023-11-28 NOTE — Telephone Encounter (Signed)
Received call from patient. He states he has developed a cough and is asking if this will affect his appt for patient education tomorrow or his 1st treatment on 12/02/23. Advised that he should et checked for Covid and the flu. Once he does that, advised that he needs to call me back with those results. Will advise him more at that time. Pt voiced understanding.

## 2023-11-29 ENCOUNTER — Ambulatory Visit
Admission: EM | Admit: 2023-11-29 | Discharge: 2023-11-29 | Disposition: A | Discharge status: Home / Self Care | Payer: 59 | Attending: Family Medicine | Admitting: Family Medicine

## 2023-11-29 ENCOUNTER — Inpatient Hospital Stay: Payer: 59

## 2023-11-29 ENCOUNTER — Telehealth: Payer: Self-pay | Admitting: *Deleted

## 2023-11-29 ENCOUNTER — Other Ambulatory Visit: Payer: Self-pay | Admitting: *Deleted

## 2023-11-29 DIAGNOSIS — J101 Influenza due to other identified influenza virus with other respiratory manifestations: Secondary | ICD-10-CM

## 2023-11-29 DIAGNOSIS — C9110 Chronic lymphocytic leukemia of B-cell type not having achieved remission: Secondary | ICD-10-CM

## 2023-11-29 DIAGNOSIS — Z20828 Contact with and (suspected) exposure to other viral communicable diseases: Secondary | ICD-10-CM | POA: Diagnosis not present

## 2023-11-29 HISTORY — DX: Chronic lymphocytic leukemia of B-cell type not having achieved remission: C91.10

## 2023-11-29 LAB — POC COVID19/FLU A&B COMBO
Covid Antigen, POC: NEGATIVE
Influenza A Antigen, POC: POSITIVE — AB
Influenza B Antigen, POC: NEGATIVE

## 2023-11-29 MED ORDER — OSELTAMIVIR PHOSPHATE 75 MG PO CAPS: 75.0000 mg | ORAL_CAPSULE | Freq: Two times a day (BID) | ORAL | 0 refills | Status: DC

## 2023-11-29 MED ORDER — PROMETHAZINE-DM 6.25-15 MG/5ML PO SYRP: 5.0000 mL | ORAL_SOLUTION | Freq: Three times a day (TID) | ORAL | 0 refills | Status: DC | PRN

## 2023-11-29 MED ORDER — ALLOPURINOL 300 MG PO TABS: 300.0000 mg | ORAL_TABLET | Freq: Every day | ORAL | 3 refills | Status: DC

## 2023-11-29 MED ORDER — PROCHLORPERAZINE MALEATE 10 MG PO TABS: 10.0000 mg | ORAL_TABLET | Freq: Four times a day (QID) | ORAL | 2 refills | Status: DC | PRN

## 2023-11-29 MED ORDER — ONDANSETRON HCL 8 MG PO TABS: 8.0000 mg | ORAL_TABLET | Freq: Three times a day (TID) | ORAL | 2 refills | Status: DC | PRN

## 2023-11-29 NOTE — Telephone Encounter (Signed)
Received call from pt. 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 He states he will not be able to make his 9am appt for patient education. Advised that I would check into getting that appt changed. TCT back to patient to advise that his new appt for education is on Monday, 12/02/23 @ 11am. Reviewed the rest of his appts for next week with him. He voiced understanding. Also informed pt that his home meds for nausea etc have been sent in to his pharmacy @ [facility] [address].   He will be able to pick them up anytime after noon today. He said he would pick them up.

## 2023-11-29 NOTE — ED Triage Notes (Signed)
Pt reports congestion, cough and abdominal pain when coughing. Pt has not taken any meds. States wife has Flu.   Pt reports he will starts chemo next week for  CLL leukemia.

## 2023-11-29 NOTE — ED Provider Notes (Signed)
[facility]  Note:  This document was prepared using Conservation officer, historic buildings and may include unintentional dictation errors.  MRN: [id number] DOB: [birth year]  Subjective:   James Parrish is a 58 y.o. male presenting for 2-day history of coughing, congestion, malaise and fatigue.  Has exposure to influenza from his wife.  Has CLL and is starting treatment next week.  No asthma.  No smoking of any kind including cigarettes, cigars, vaping, marijuana use.    No current facility-administered medications for this encounter.  Current Outpatient Medications:    allopurinol (ZYLOPRIM) 300 MG tablet, Take 1 tablet (300 mg total) by mouth daily., Disp: 30 tablet, Rfl: 3   amLODipine (NORVASC) 10 MG tablet, TAKE 1 TABLET BY MOUTH AT BEDTIME, Disp: 90 tablet, Rfl: 0   atorvastatin (LIPITOR) 10 MG tablet, TAKE 1 TABLET BY MOUTH DAILY, Disp: 30 tablet, Rfl: 3   dapagliflozin propanediol (FARXIGA) 10 MG TABS tablet, Take 1 tablet (10 mg total) by mouth daily before breakfast., Disp: 90 tablet, Rfl: 3   diclofenac (VOLTAREN) 75 MG EC tablet, Take 75 mg by mouth 2 (two) times daily., Disp: , Rfl:    glucose blood test strip, Use as instructed to test blood sugar 2 times daily E11.65    Onetouch strips, Disp: 100 each, Rfl: 12   indapamide (LOZOL) 1.25 MG tablet, TAKE 1 TABLET BY MOUTH DAILY, Disp: 90 tablet, Rfl: 0   insulin glargine (LANTUS SOLOSTAR) 100 UNIT/ML Solostar Pen, Inject 30 Units into the skin daily., Disp: 30 mL, Rfl: 6   Insulin Pen Needle 29G X MISC, 1 Device by Does not apply route daily in the afternoon., Disp: 100 each, Rfl: 3   losartan (COZAAR) 100 MG tablet, TAKE 1 TABLET BY MOUTH DAILY, Disp: 90 tablet, Rfl: 0   metFORMIN (GLUCOPHAGE) 1000 MG tablet, Take 1 tablet (1,000 mg total) by mouth 2 (two) times daily with a meal., Disp: 180 tablet, Rfl: 3   Multiple Vitamin (MULTIVITAMIN WITH MINERALS) TABS tablet, Take 1 tablet by mouth daily., Disp: ,  Rfl:    nebivolol (BYSTOLIC) 10 MG tablet, TAKE 1 TABLET BY MOUTH DAILY, Disp: 90 tablet, Rfl: 0   ondansetron (ZOFRAN) 8 MG tablet, Take 1 tablet (8 mg total) by mouth every 8 (eight) hours as needed for nausea or vomiting., Disp: 20 tablet, Rfl: 2   prochlorperazine (COMPAZINE) 10 MG tablet, Take 1 tablet (10 mg total) by mouth every 6 (six) hours as needed for nausea or vomiting., Disp: 30 tablet, Rfl: 2   Semaglutide, 2 MG/DOSE, (OZEMPIC, 2 MG/DOSE,) 8 MG/3ML SOPN, DIAL AND INJECT UNDER THE SKIN 2 MG WEEKLY, Disp: 3 mL, Rfl: 1   spironolactone (ALDACTONE) 50 MG tablet, TAKE 1 TABLET BY MOUTH DAILY, Disp: 30 tablet, Rfl: 0   tadalafil (CIALIS) 20 MG tablet, TAKE 1 TABLET BY MOUTH DAILY AS NEEDED FOR ERECTILE DYSFUNCTION, Disp: 10 tablet, Rfl: 2   No Known Allergies  Past Medical History:  Diagnosis Date   Complication of anesthesia    hard to wake up    Diabetes mellitus without complication [facility])    ED (erectile dysfunction)    History of COVID-19 04/2019   Hyperlipidemia    Hypertension    Sleep apnea    uses CPAP nightly     Past Surgical History:  Procedure Laterality Date   BRONCHIAL NEEDLE ASPIRATION BIOPSY  05/29/2021   Procedure: BRONCHIAL NEEDLE ASPIRATION BIOPSIES;  Surgeon: Josephine Igo, DO;  [facility];  Service: Pulmonary;;   COLONOSCOPY     finger surgery Right    Index   KNEE SURGERY Left    KNEE SURGERY Right    ORIF PATELLA Right    REVERSE SHOULDER ARTHROPLASTY Right 08/31/2020   Procedure: REVERSE SHOULDER ARTHROPLASTY;  Surgeon: Bjorn Pippin, MD;  Location: [facility];  Service: Orthopedics;  Laterality: Right;   SHOULDER ARTHROSCOPY WITH ROTATOR CUFF REPAIR Left 02/04/2020   Procedure: SHOULDER ARTHROSCOPY WITH ROTATOR CUFF REPAIR WITH BICEP TENODESIS;  Surgeon: Bjorn Pippin, MD;  Location: [facility];  Service: Orthopedics;  Laterality: Left;   VIDEO BRONCHOSCOPY WITH ENDOBRONCHIAL ULTRASOUND Left 05/29/2021    Procedure: VIDEO BRONCHOSCOPY WITH ENDOBRONCHIAL ULTRASOUND;  Surgeon: Josephine Igo, DO;  Location: [facility];  Service: Pulmonary;  Laterality: Left;    Family History  Problem Relation Age of Onset   Cancer Mother        breast cancer    Hypertension Mother    Diabetes Mother    Venous thrombosis Son    Colon cancer Neg Hx    Esophageal cancer Neg Hx    Stomach cancer Neg Hx    Rectal cancer Neg Hx     Social History   Tobacco Use   Smoking status: Former    Current packs/day: 0.00    Average packs/day: 1.5 packs/day for 25.0 years (37.5 ttl pk-yrs)    Types: Cigarettes    Start date: 04/1995    Quit date: 04/2020    Years since quitting: 3.6    Passive exposure: Past   Smokeless tobacco: Never  Vaping Use   Vaping status: Never Used  Substance Use Topics   Alcohol use: Not Currently    Comment: occasionally   Drug use: Never    ROS   Objective:   Vitals: BP 128/80 (BP Location: Right Arm)   Pulse 84   Temp 98.1 F (36.7 C) (Oral)   Resp 18   SpO2 95%   Physical Exam Constitutional:      General: He is not in acute distress.    Appearance: Normal appearance. He is well-developed and normal weight. He is not ill-appearing, toxic-appearing or diaphoretic.  HENT:     Head: Normocephalic and atraumatic.     Right Ear: Tympanic membrane, ear canal and external ear normal. No drainage, swelling or tenderness. No middle ear effusion. There is no impacted cerumen. Tympanic membrane is not erythematous or bulging.     Left Ear: Tympanic membrane, ear canal and external ear normal. No drainage, swelling or tenderness.  No middle ear effusion. There is no impacted cerumen. Tympanic membrane is not erythematous or bulging.     Nose: Nose normal. No congestion or rhinorrhea.     Mouth/Throat:     Mouth: Mucous membranes are moist.     Pharynx: No oropharyngeal exudate or posterior oropharyngeal erythema.  Eyes:     General: No scleral icterus.       Right  eye: No discharge.        Left eye: No discharge.     Extraocular Movements: Extraocular movements intact.     Conjunctiva/sclera: Conjunctivae normal.  Cardiovascular:     Rate and Rhythm: Normal rate and regular rhythm.     Heart sounds: Normal heart sounds. No murmur heard.    No friction rub. No gallop.  Pulmonary:     Effort: Pulmonary effort is normal. No respiratory distress.     Breath sounds:  Normal breath sounds. No stridor. No wheezing, rhonchi or rales.  Musculoskeletal:     Cervical back: Normal range of motion and neck supple. No rigidity. No muscular tenderness.  Neurological:     General: No focal deficit present.     Mental Status: He is alert and oriented to person, place, and time.  Psychiatric:        Mood and Affect: Mood normal.        Behavior: Behavior normal.        Thought Content: Thought content normal.     Results for orders placed or performed during the hospital encounter of 11/29/23 (from the past 24 hours)  POC Covid + Flu A/B Antigen     Status: Abnormal   Collection Time: 11/29/23  6:13 PM  Result Value Ref Range   Influenza A Antigen, POC Positive (A) Negative   Influenza B Antigen, POC Negative Negative   Covid Antigen, POC Negative Negative    Assessment and Plan :   PDMP not reviewed this encounter.  1. Influenza A   2. Exposure to influenza   3. CLL (chronic lymphocytic leukemia) (HCC)    Deferred imaging given clear cardiopulmonary exam, hemodynamically stable vital signs. Will cover for influenza with Tamiflu given + results.  Use supportive care, rest, fluids, hydration, light meals, schedule Tylenol and ibuprofen. Counseled patient on potential for adverse effects with medications prescribed today, patient verbalized understanding. ER and return-to-clinic precautions discussed, patient verbalized understanding.    Wallis Bamberg, [place] 11/29/23 1815

## 2023-11-29 NOTE — Discharge Instructions (Addendum)
We will manage this as influenza with Tamiflu given your symptoms and exposure to flu with your wife. For sore throat or cough try using a honey-based tea. Use 3 teaspoons of honey with juice squeezed from half lemon. Place shaved pieces of ginger into 1/2-1 cup of water and warm over stove top. Then mix the ingredients and repeat every 4 hours as needed. Please take Tylenol 500mg -650mg  once every 6 hours for fevers, aches and pains. Hydrate very well with at least 2 liters (64 ounces) of water. Eat light meals such as soups (chicken and noodles, chicken wild rice, vegetable).  Do not eat any foods that you are allergic to.  Start an antihistamine like Zyrtec (10mg  daily) for postnasal drainage, sinus congestion.  You can take this together with cough syrup.

## 2023-12-02 ENCOUNTER — Inpatient Hospital Stay: Payer: 59

## 2023-12-03 ENCOUNTER — Encounter: Payer: Self-pay | Admitting: *Deleted

## 2023-12-03 ENCOUNTER — Inpatient Hospital Stay: Payer: 59 | Admitting: Hematology and Oncology

## 2023-12-03 ENCOUNTER — Inpatient Hospital Stay: Payer: 59

## 2023-12-03 VITALS — BP 136/97 | HR 77 | Temp 98.0°F | Resp 18 | Ht 72.5 in | Wt 257.2 lb

## 2023-12-03 DIAGNOSIS — C9110 Chronic lymphocytic leukemia of B-cell type not having achieved remission: Secondary | ICD-10-CM | POA: Diagnosis not present

## 2023-12-03 DIAGNOSIS — D72820 Lymphocytosis (symptomatic): Secondary | ICD-10-CM

## 2023-12-03 LAB — URIC ACID: Uric Acid, Serum: 4.1 mg/dL (ref 3.7–8.6)

## 2023-12-03 LAB — CMP (CANCER CENTER ONLY)
ALT: 14 U/L (ref 0–44)
AST: 14 U/L — ABNORMAL LOW (ref 15–41)
Albumin: 4.2 g/dL (ref 3.5–5.0)
Alkaline Phosphatase: 53 U/L (ref 38–126)
Anion gap: 6 (ref 5–15)
BUN: 11 mg/dL (ref 6–20)
CO2: 30 mmol/L (ref 22–32)
Calcium: 8.7 mg/dL — ABNORMAL LOW (ref 8.9–10.3)
Chloride: 107 mmol/L (ref 98–111)
Creatinine: 0.87 mg/dL (ref 0.61–1.24)
GFR, Estimated: >60 mL/min (ref >60)
Glucose, Bld: 98 mg/dL (ref 70–99)
Potassium: 3.9 mmol/L (ref 3.5–5.1)
Sodium: 143 mmol/L (ref 135–145)
Total Bilirubin: 0.3 mg/dL (ref 0.0–1.2)
Total Protein: 6.2 g/dL — ABNORMAL LOW (ref 6.5–8.1)

## 2023-12-03 LAB — CBC WITH DIFFERENTIAL (CANCER CENTER ONLY)
Abs Immature Granulocytes: 0 10*3/uL (ref 0.00–0.07)
Band Neutrophils: 1 %
Basophils Absolute: 0 10*3/uL (ref 0.0–0.1)
Basophils Relative: 0 %
Eosinophils Absolute: 0 10*3/uL (ref 0.0–0.5)
Eosinophils Relative: 0 %
HCT: 45.2 % (ref 39.0–52.0)
Hemoglobin: 14 g/dL (ref 13.0–17.0)
Lymphocytes Relative: 82 %
Lymphs Abs: 52.6 10*3/uL — ABNORMAL HIGH (ref 0.7–4.0)
MCH: 24.3 pg — ABNORMAL LOW (ref 26.0–34.0)
MCHC: 31 g/dL (ref 30.0–36.0)
MCV: 78.3 fL — ABNORMAL LOW (ref 80.0–100.0)
Monocytes Absolute: 0.6 10*3/uL (ref 0.1–1.0)
Monocytes Relative: 1 %
Neutro Abs: 10.9 10*3/uL — ABNORMAL HIGH (ref 1.7–7.7)
Neutrophils Relative %: 16 %
Platelet Count: 183 10*3/uL (ref 150–400)
RBC: 5.77 MIL/uL (ref 4.22–5.81)
RDW: 15 % (ref 11.5–15.5)
Smear Review: NORMAL
WBC Count: 64.2 10*3/uL (ref 4.0–10.5)
nRBC: 0 % (ref 0.0–0.2)

## 2023-12-03 LAB — LACTATE DEHYDROGENASE: LDH: 148 U/L (ref 98–192)

## 2023-12-03 NOTE — Progress Notes (Signed)
[facility] [phone]   [phone]  PROGRESS NOTE  Patient Care Team: James Grandchild, MD as PCP - General (Internal Medicine) Quintella Reichert, MD as PCP - Sleep Medicine (Cardiology)  Hematological/Oncological History # CLL, Rai Stage 0. Del 11 01/25/2020: WBC 9.6, Hgb 15.6, MCV 79.1, Plt 241 05/29/2021: WBC 13.3, Hgb 15.4, MCV 76.9, Plt 203 11/16/2021: patient had routine CBC collected, showed WBC 15.9, Hgb 14.8, MCV 76.4, ALC 11.6 12/14/2021: establish care with Dr. Leonides Schanz  05/24/2023: WBC 43.2, Hgb 14.6, MCV 77.8, Plt 217 11/25/2023: WBC 95.9, Hgb 15.4, MCV 77.8, Plt 221   Interval History:  James Parrish 58 y.o. male with medical history significant for CLL who presents for a follow up visit. The patient's last visit was on 11/24/2022. In the interim, he denies any changes to his health.   On exam today James Parrish reports he was diagnosed with flu in interim since her last visit and started on Tamiflu.  He reports that he was having cough and runny nose but no frank fevers, chills, sweats.  He was having some diarrhea as well but no headache.  He reports he is not feeling much better and his energy is improved.  He reports that he is delighted that his white blood cell count appears to be normalizing now that his flu infection has been diagnosed and is being treated. He denies any B symptoms including fevers, night sweats or weight loss. He has no other complaints.  A full 10 point ROS was otherwise negative.  Today we discussed the results of his blood work.  Interestingly the patient was diagnosed with flu in the interim since her last visit and began on Tamiflu.  There is been a marked reduction in his white blood cell count, with his lymphocytes decreasing down and white blood cell now in the 60s (down from 90s).  It is possible that the patient's acute infection with flu was falsely bolstering his white blood cell count.  Given this I would recommend  rechecking his labs next week in order to assure that they continue to trend downward.  Additionally we will put his treatment on hold as this may not have been progression of his CLL, rather a response to his flu infection.  The patient voiced understanding of our findings and plan moving forward.  MEDICAL HISTORY:  Past Medical History:  Diagnosis Date   CLL (chronic lymphocytic leukemia) (HCC)    Complication of anesthesia    hard to wake up    Diabetes mellitus without complication [facility])    ED (erectile dysfunction)    History of COVID-19 04/2019   Hyperlipidemia    Hypertension    Sleep apnea    uses CPAP nightly    SURGICAL HISTORY: Past Surgical History:  Procedure Laterality Date   BRONCHIAL NEEDLE ASPIRATION BIOPSY  05/29/2021   Procedure: BRONCHIAL NEEDLE ASPIRATION BIOPSIES;  Surgeon: Josephine Igo, DO;  [facility];  Service: Pulmonary;;   COLONOSCOPY     finger surgery Right    Index   KNEE SURGERY Left    KNEE SURGERY Right    ORIF PATELLA Right    REVERSE SHOULDER ARTHROPLASTY Right 08/31/2020   Procedure: REVERSE SHOULDER ARTHROPLASTY;  Surgeon: Bjorn Pippin, MD;  Location: [facility];  Service: Orthopedics;  Laterality: Right;   SHOULDER ARTHROSCOPY WITH ROTATOR CUFF REPAIR Left 02/04/2020   Procedure: SHOULDER ARTHROSCOPY WITH ROTATOR CUFF REPAIR WITH BICEP TENODESIS;  Surgeon: Everardo Pacific,  Murriel Hopper, MD;  Location: [facility];  Service: Orthopedics;  Laterality: Left;   VIDEO BRONCHOSCOPY WITH ENDOBRONCHIAL ULTRASOUND Left 05/29/2021   Procedure: VIDEO BRONCHOSCOPY WITH ENDOBRONCHIAL ULTRASOUND;  Surgeon: Josephine Igo, DO;  [facility];  Service: Pulmonary;  Laterality: Left;    SOCIAL HISTORY: Social History   Socioeconomic History   Marital status: Married    Spouse name: Not on file   Number of children: Not on file   Years of education: Not on file   Highest education level: 12th grade  Occupational  History   Not on file  Tobacco Use   Smoking status: Former    Current packs/day: 0.00    Average packs/day: 1.5 packs/day for 25.0 years (37.5 ttl pk-yrs)    Types: Cigarettes    Start date: 04/1995    Quit date: 04/2020    Years since quitting: 3.6    Passive exposure: Past   Smokeless tobacco: Never  Vaping Use   Vaping status: Never Used  Substance and Sexual Activity   Alcohol use: Not Currently    Comment: occasionally   Drug use: Never   Sexual activity: Yes    Partners: Female  Other Topics Concern   Not on file  Social History Narrative   Not on file   Social Drivers of Health   Financial Resource Strain: Medium Risk (01/26/2023)   Overall Financial Resource Strain (CARDIA)    Difficulty of Paying Living Expenses: Somewhat hard  Food Insecurity: Food Insecurity Present (01/26/2023)   Hunger Vital Sign    Worried About Running Out of Food in the Last Year: Sometimes true    Ran Out of Food in the Last Year: Sometimes true  Transportation Needs: No Transportation Needs (01/26/2023)   PRAPARE - Administrator, Civil Service (Medical): No    Lack of Transportation (Non-Medical): No  Physical Activity: Insufficiently Active (01/26/2023)   Exercise Vital Sign    Days of Exercise per Week: 5 days    Minutes of Exercise per Session: 20 min  Stress: No Stress Concern Present (01/26/2023)   [employer] of Occupational Health - Occupational Stress Questionnaire    Feeling of Stress : Not at all  Social Connections: Socially Isolated (01/26/2023)   Social Connection and Isolation Panel [NHANES]    Frequency of Communication with Friends and Family: Twice a week    Frequency of Social Gatherings with Friends and Family: Never    Attends Religious Services: Never    Database administrator or Organizations: No    Attends Engineer, structural: Not on file    Marital Status: Married  Catering manager Violence: Not on file    FAMILY HISTORY: Family  History  Problem Relation Age of Onset   Cancer Mother        breast cancer    Hypertension Mother    Diabetes Mother    Venous thrombosis Son    Colon cancer Neg Hx    Esophageal cancer Neg Hx    Stomach cancer Neg Hx    Rectal cancer Neg Hx     ALLERGIES:  has no known allergies.  MEDICATIONS:  Current Outpatient Medications  Medication Sig Dispense Refill   allopurinol (ZYLOPRIM) 300 MG tablet Take 1 tablet (300 mg total) by mouth daily. 30 tablet 3   amLODipine (NORVASC) 10 MG tablet TAKE 1 TABLET BY MOUTH AT BEDTIME 90 tablet 0   atorvastatin (LIPITOR) 10 MG tablet TAKE 1 TABLET  BY MOUTH DAILY 30 tablet 3   dapagliflozin propanediol (FARXIGA) 10 MG TABS tablet Take 1 tablet (10 mg total) by mouth daily before breakfast. 90 tablet 3   diclofenac (VOLTAREN) 75 MG EC tablet Take 75 mg by mouth 2 (two) times daily.     glucose blood test strip Use as instructed to test blood sugar 2 times daily E11.65    Onetouch strips 100 each 12   indapamide (LOZOL) 1.25 MG tablet TAKE 1 TABLET BY MOUTH DAILY 90 tablet 0   insulin glargine (LANTUS SOLOSTAR) 100 UNIT/ML Solostar Pen Inject 30 Units into the skin daily. 30 mL 6   Insulin Pen Needle 29G X MISC 1 Device by Does not apply route daily in the afternoon. 100 each 3   losartan (COZAAR) 100 MG tablet TAKE 1 TABLET BY MOUTH DAILY 90 tablet 0   metFORMIN (GLUCOPHAGE) 1000 MG tablet Take 1 tablet (1,000 mg total) by mouth 2 (two) times daily with a meal. 180 tablet 3   Multiple Vitamin (MULTIVITAMIN WITH MINERALS) TABS tablet Take 1 tablet by mouth daily.     nebivolol (BYSTOLIC) 10 MG tablet TAKE 1 TABLET BY MOUTH DAILY 90 tablet 0   ondansetron (ZOFRAN) 8 MG tablet Take 1 tablet (8 mg total) by mouth every 8 (eight) hours as needed for nausea or vomiting. 20 tablet 2   oseltamivir (TAMIFLU) 75 MG capsule Take 1 capsule (75 mg total) by mouth 2 (two) times daily. 10 capsule 0   prochlorperazine (COMPAZINE) 10 MG tablet Take 1 tablet (10  mg total) by mouth every 6 (six) hours as needed for nausea or vomiting. 30 tablet 2   promethazine-dextromethorphan (PROMETHAZINE-DM) 6.25-15 MG/5ML syrup Take 5 mLs by mouth 3 (three) times daily as needed for cough. 200 mL 0   Semaglutide, 2 MG/DOSE, (OZEMPIC, 2 MG/DOSE,) 8 MG/3ML SOPN DIAL AND INJECT UNDER THE SKIN 2 MG WEEKLY 3 mL 1   spironolactone (ALDACTONE) 50 MG tablet TAKE 1 TABLET BY MOUTH DAILY 30 tablet 0   tadalafil (CIALIS) 20 MG tablet TAKE 1 TABLET BY MOUTH DAILY AS NEEDED FOR ERECTILE DYSFUNCTION 10 tablet 2   No current facility-administered medications for this visit.    REVIEW OF SYSTEMS:   Constitutional: ( - ) fevers, ( - )  chills , ( - ) night sweats Eyes: ( - ) blurriness of vision, ( - ) double vision, ( - ) watery eyes Ears, nose, mouth, throat, and face: ( - ) mucositis, ( - ) sore throat Respiratory: ( - ) cough, ( - ) dyspnea, ( - ) wheezes Cardiovascular: ( - ) palpitation, ( - ) chest discomfort, ( - ) lower extremity swelling Gastrointestinal:  ( - ) nausea, ( - ) heartburn, ( - ) change in bowel habits Skin: ( - ) abnormal skin rashes Lymphatics: ( - ) new lymphadenopathy, ( - ) easy bruising Neurological: ( - ) numbness, ( - ) tingling, ( - ) new weaknesses Behavioral/Psych: ( - ) mood change, ( - ) new changes  All other systems were reviewed with the patient and are negative.  PHYSICAL EXAMINATION: ECOG PERFORMANCE STATUS: 0 - Asymptomatic  Vitals:   12/03/23 0950 12/03/23 1008  BP: (!) 140/106 (!) 136/97  Pulse: 77   Resp: 18   Temp: 98 F (36.7 C)   SpO2: 100%     Filed Weights   12/03/23 0950  Weight: 257 lb 4 oz (116.7 kg)     GENERAL: Well-appearing middle-aged  African-American male alert, no distress and comfortable SKIN: skin color, texture, turgor are normal, no rashes or significant lesions EYES: conjunctiva are pink and non-injected, sclera clear NECK: supple, non-tender LYMPH: Mildly prominent left-sided cervical lymph  nodes, no other overt lymphadenopathy noted on exam LUNGS: clear to auscultation and percussion with normal breathing effort HEART: regular rate & rhythm and no murmurs and no lower extremity edema Musculoskeletal: no cyanosis of digits and no clubbing  PSYCH: alert & oriented x 3, fluent speech NEURO: no focal motor/sensory deficits  LABORATORY DATA:  I have reviewed the data as listed    Latest Ref Rng & Units 12/03/2023    9:05 AM 11/25/2023   10:57 AM 05/24/2023   11:02 AM  CBC  WBC 4.0 - 10.5 K/uL 64.2  95.9  43.2   Hemoglobin 13.0 - 17.0 g/dL 54.0  98.1  19.1   Hematocrit 39.0 - 52.0 % 45.2  49.2  45.6   Platelets 150 - 400 K/uL 183  221  217        Latest Ref Rng & Units 12/03/2023    9:05 AM 11/25/2023   10:57 AM 05/24/2023   11:02 AM  CMP  Glucose 70 - 99 mg/dL 98  478  295   BUN 6 - 20 mg/dL 11  16  18    Creatinine 0.61 - 1.24 mg/dL 6.21  3.08  6.57   Sodium 135 - 145 mmol/L 143  142  141   Potassium 3.5 - 5.1 mmol/L 3.9  4.1  4.0   Chloride 98 - 111 mmol/L 107  103  108   CO2 22 - 32 mmol/L 30  30  27    Calcium 8.9 - 10.3 mg/dL 8.7  9.8  9.0   Total Protein 6.5 - 8.1 g/dL 6.2  6.9  6.5   Total Bilirubin 0.0 - 1.2 mg/dL 0.3  0.5  0.4   Alkaline Phos 38 - 126 U/L 53  57  52   AST 15 - 41 U/L 14  18  14    ALT 0 - 44 U/L 14  12  14      RADIOGRAPHIC STUDIES:  No results found.  ASSESSMENT & PLAN ERASMO VERTZ 58 y.o. male with medical history significant for CLL who presents for a follow up visit.  # CLL, Rai Stage 0 -- Findings at this time most consistent with a CLL Rai stage 0 --Prognostic panel showed del 11 predominately (small population of deletion 13 noted as well). IgVH negative, ZAP 70 positive  --Labs show white blood cell count 64.2, hemoglobin 14.0, MCV 78.3, platelets 183 -- White blood cell count decreased to 74.2, down from 95.9 last week.  In the interim patient was diagnosed with flu.  Will hold on therapy and determine if this was a temporary  spike in white blood cells due to acute infection. -- In the event patient requires treatment recommended [place] versus venetoclax/obinutuzumab.  After discussing the risks and benefits of both options he noted he would prefer the limited duration of therapy offered by venetoclax and obinutuzumab.   --RTC in 1 week for labs  No orders of the defined types were placed in this encounter.   All questions were answered. The patient knows to call the clinic with any problems, questions or concerns.  I have spent a total of 30 minutes minutes of face-to-face and non-face-to-face time, preparing to see the patient,  performing a medically appropriate examination, counseling and educating the patient, documenting clinical information  in the electronic health record, and care coordination.   Ulysees Barns, MD Department of Hematology/Oncology [facility] Phone: [phone] Pager: [phone] Email: [email or web address]   12/03/2023 2:50 PM  Mathis Fare BD, Catovsky D, Caligaris-Cappio F, Dighiero G, Dhner H, [place], [place], [place] E, Chiorazzi N, [place], Rai KR, [place], Eichhorst B, O'Brien S, Robak T, Seymour JF, Kipps TJ. iwCLL guidelines for diagnosis, indications for treatment, response assessment, and supportive management of CLL. Blood. 2018 Jun 21;131(25):2745-2760.  Active disease should be clearly documented to initiate therapy. At least 1 of the following criteria should be met.  1) Evidence of progressive marrow failure as manifested by the development of, or worsening of, anemia and/or thrombocytopenia. Cutoff levels of Hb <10 g/dL or platelet counts <272  109/L are generally regarded as indication for treatment. However, in some patients, platelet counts <100  109/L may remain stable over a long period; this situation does not automatically require therapeutic intervention. 2) Massive (ie, >=6 cm below the left  costal margin) or progressive or symptomatic splenomegaly. 3) Massive nodes (ie, >=10 cm in longest diameter) or progressive or symptomatic lymphadenopathy. 4) Progressive lymphocytosis with an increase of >=50% over a 62-month period, or lymphocyte doubling time (LDT) <6 months. LDT can be obtained by linear regression extrapolation of absolute lymphocyte counts obtained at intervals of 2 weeks over an observation period of 2 to 3 months; patients with initial blood lymphocyte counts <30  109/L may require a longer observation period to determine the LDT. Factors contributing to lymphocytosis other than CLL (eg, infections, steroid administration) should be excluded. 5) Autoimmune complications including anemia or thrombocytopenia poorly responsive to corticosteroids. 6) Symptomatic or functional extranodal involvement (eg, skin, kidney, lung, spine). Disease-related symptoms as defined by any of the following: Unintentional weight loss >=10% within the previous 6 months. Significant fatigue (ie, ECOG performance scale 2 or worse; cannot work or unable to perform usual activities). Fevers >=100.26F or 38.0C for 2 or more weeks without evidence of infection. Night sweats for >=1 month without evidence of infection.

## 2023-12-05 ENCOUNTER — Other Ambulatory Visit: Payer: 59

## 2023-12-05 ENCOUNTER — Ambulatory Visit: Payer: 59

## 2023-12-05 ENCOUNTER — Telehealth: Payer: Self-pay | Admitting: Hematology and Oncology

## 2023-12-05 ENCOUNTER — Ambulatory Visit: Payer: 59 | Admitting: Hematology and Oncology

## 2023-12-06 ENCOUNTER — Ambulatory Visit: Payer: 59

## 2023-12-09 ENCOUNTER — Inpatient Hospital Stay: Payer: 59

## 2023-12-09 ENCOUNTER — Encounter: Payer: Self-pay | Admitting: *Deleted

## 2023-12-09 DIAGNOSIS — C9110 Chronic lymphocytic leukemia of B-cell type not having achieved remission: Secondary | ICD-10-CM | POA: Diagnosis not present

## 2023-12-09 LAB — CBC WITH DIFFERENTIAL (CANCER CENTER ONLY)
Abs Immature Granulocytes: 0.3 10*3/uL — ABNORMAL HIGH (ref 0.00–0.07)
Basophils Absolute: 0 10*3/uL (ref 0.0–0.1)
Basophils Relative: 0 %
Eosinophils Absolute: 0.1 10*3/uL (ref 0.0–0.5)
Eosinophils Relative: 0 %
HCT: 45.9 % (ref 39.0–52.0)
Hemoglobin: 14.3 g/dL (ref 13.0–17.0)
Immature Granulocytes: 0 %
Lymphocytes Relative: 88 %
Lymphs Abs: 68.5 10*3/uL — ABNORMAL HIGH (ref 0.7–4.0)
MCH: 24.4 pg — ABNORMAL LOW (ref 26.0–34.0)
MCHC: 31.2 g/dL (ref 30.0–36.0)
MCV: 78.5 fL — ABNORMAL LOW (ref 80.0–100.0)
Monocytes Absolute: 2.9 10*3/uL — ABNORMAL HIGH (ref 0.1–1.0)
Monocytes Relative: 4 %
Neutro Abs: 6 10*3/uL (ref 1.7–7.7)
Neutrophils Relative %: 8 %
Platelet Count: 283 10*3/uL (ref 150–400)
RBC: 5.85 MIL/uL — ABNORMAL HIGH (ref 4.22–5.81)
RDW: 15.2 % (ref 11.5–15.5)
Smear Review: NORMAL
WBC Count: 77.8 10*3/uL (ref 4.0–10.5)
nRBC: 0 % (ref 0.0–0.2)

## 2023-12-09 LAB — CMP (CANCER CENTER ONLY)
ALT: 16 U/L (ref 0–44)
AST: 14 U/L — ABNORMAL LOW (ref 15–41)
Albumin: 4.3 g/dL (ref 3.5–5.0)
Alkaline Phosphatase: 49 U/L (ref 38–126)
Anion gap: 8 (ref 5–15)
BUN: 17 mg/dL (ref 6–20)
CO2: 26 mmol/L (ref 22–32)
Calcium: 8.8 mg/dL — ABNORMAL LOW (ref 8.9–10.3)
Chloride: 107 mmol/L (ref 98–111)
Creatinine: 1.05 mg/dL (ref 0.61–1.24)
GFR, Estimated: >60 mL/min (ref >60)
Glucose, Bld: 124 mg/dL — ABNORMAL HIGH (ref 70–99)
Potassium: 3.8 mmol/L (ref 3.5–5.1)
Sodium: 141 mmol/L (ref 135–145)
Total Bilirubin: 0.3 mg/dL (ref 0.0–1.2)
Total Protein: 6.6 g/dL (ref 6.5–8.1)

## 2023-12-09 LAB — LACTATE DEHYDROGENASE: LDH: 132 U/L (ref 98–192)

## 2023-12-09 LAB — URIC ACID: Uric Acid, Serum: 5.1 mg/dL (ref 3.7–8.6)

## 2023-12-09 NOTE — Progress Notes (Signed)
 CRITICAL VALUE STICKER  CRITICAL VALUE: WBC 77.8   RECEIVER (on-site recipient of call):Kim RN  DATE & TIME NOTIFIED: 12/08/2022 @ 8:18am  MESSENGER (representative from lab):Amber  MD NOTIFIED: Dr Leonides Schanz   TIME OF NOTIFICATION: 08:22am  RESPONSE: Provider is aware.  Patients WBC count has been elevated and has improved and then declined.

## 2023-12-12 ENCOUNTER — Telehealth: Payer: Self-pay | Admitting: *Deleted

## 2023-12-12 NOTE — Telephone Encounter (Signed)
-----   Message from Ulysees Barns IV sent at 12/09/2023  8:48 AM EST ----- Please let Mr. Saffran know that his WBC increased to 77.8. I would recommend a repeat lab check in 2 weeks time to reassess.  Please schedule that lab visit and notify the patient. ----- Message ----- From: Interface, Lab In [place] Sent: 12/09/2023   8:19 AM EST To: Jaci Standard, MD

## 2023-12-12 NOTE — Telephone Encounter (Signed)
 TCT pt's wife James Parrish. No answer but was able to leave vm message on identified phone. Advised that his WBC increased to 77.8. I would recommend a repeat lab check in 2 weeks time to reassess. Please schedule that lab visit and notify the patient.  Advised to call back with any questions or concerns.  Scheduling message sent for lab appt in 2 weeks.

## 2023-12-13 ENCOUNTER — Ambulatory Visit: Payer: 59

## 2023-12-13 ENCOUNTER — Other Ambulatory Visit: Payer: 59

## 2023-12-20 ENCOUNTER — Ambulatory Visit: Payer: 59 | Admitting: Physician Assistant

## 2023-12-20 ENCOUNTER — Ambulatory Visit: Payer: 59

## 2023-12-20 ENCOUNTER — Other Ambulatory Visit: Payer: 59

## 2023-12-23 ENCOUNTER — Telehealth: Payer: Self-pay | Admitting: *Deleted

## 2023-12-23 NOTE — Telephone Encounter (Signed)
 Email of Guardian FMLA paperwork received to [email or web address] from [email or web address].  Emailed (SW) HIM tip sheet, [facility] Cover Sheet and [place] HIPAA authorization to complete and return to process paperwork.

## 2023-12-30 ENCOUNTER — Telehealth: Payer: Self-pay | Admitting: Hematology and Oncology

## 2023-12-30 ENCOUNTER — Telehealth: Payer: Self-pay | Admitting: *Deleted

## 2023-12-30 ENCOUNTER — Ambulatory Visit (INDEPENDENT_AMBULATORY_CARE_PROVIDER_SITE_OTHER): Payer: 59 | Admitting: Internal Medicine

## 2023-12-30 ENCOUNTER — Inpatient Hospital Stay: Payer: 59 | Attending: Hematology and Oncology

## 2023-12-30 ENCOUNTER — Encounter: Payer: Self-pay | Admitting: Internal Medicine

## 2023-12-30 VITALS — BP 134/86 | HR 88 | Temp 97.9°F | Ht 72.5 in | Wt 259.4 lb

## 2023-12-30 DIAGNOSIS — R9431 Abnormal electrocardiogram [ECG] [EKG]: Secondary | ICD-10-CM

## 2023-12-30 DIAGNOSIS — Z803 Family history of malignant neoplasm of breast: Secondary | ICD-10-CM | POA: Insufficient documentation

## 2023-12-30 DIAGNOSIS — E119 Type 2 diabetes mellitus without complications: Secondary | ICD-10-CM

## 2023-12-30 DIAGNOSIS — C9110 Chronic lymphocytic leukemia of B-cell type not having achieved remission: Secondary | ICD-10-CM | POA: Insufficient documentation

## 2023-12-30 DIAGNOSIS — R5383 Other fatigue: Secondary | ICD-10-CM | POA: Insufficient documentation

## 2023-12-30 DIAGNOSIS — Z0000 Encounter for general adult medical examination without abnormal findings: Secondary | ICD-10-CM | POA: Diagnosis not present

## 2023-12-30 DIAGNOSIS — I10 Essential (primary) hypertension: Secondary | ICD-10-CM

## 2023-12-30 DIAGNOSIS — R61 Generalized hyperhidrosis: Secondary | ICD-10-CM | POA: Insufficient documentation

## 2023-12-30 DIAGNOSIS — L299 Pruritus, unspecified: Secondary | ICD-10-CM | POA: Insufficient documentation

## 2023-12-30 DIAGNOSIS — Z87891 Personal history of nicotine dependence: Secondary | ICD-10-CM | POA: Insufficient documentation

## 2023-12-30 DIAGNOSIS — Z5112 Encounter for antineoplastic immunotherapy: Secondary | ICD-10-CM | POA: Insufficient documentation

## 2023-12-30 DIAGNOSIS — Z23 Encounter for immunization: Secondary | ICD-10-CM | POA: Insufficient documentation

## 2023-12-30 DIAGNOSIS — Z794 Long term (current) use of insulin: Secondary | ICD-10-CM | POA: Diagnosis not present

## 2023-12-30 DIAGNOSIS — Z0001 Encounter for general adult medical examination with abnormal findings: Secondary | ICD-10-CM

## 2023-12-30 LAB — MICROALBUMIN / CREATININE URINE RATIO
Creatinine,U: 115.4 mg/dL
Microalb Creat Ratio: UNDETERMINED mg/g (ref 0.0–30.0)
Microalb, Ur: <0.7 mg/dL

## 2023-12-30 LAB — CBC WITH DIFFERENTIAL (CANCER CENTER ONLY)
Abs Immature Granulocytes: 0.15 10*3/uL — ABNORMAL HIGH (ref 0.00–0.07)
Basophils Absolute: 0.1 10*3/uL (ref 0.0–0.1)
Basophils Relative: 0 %
Eosinophils Absolute: 0.1 10*3/uL (ref 0.0–0.5)
Eosinophils Relative: 0 %
HCT: 46.8 % (ref 39.0–52.0)
Hemoglobin: 14.7 g/dL (ref 13.0–17.0)
Immature Granulocytes: 0 %
Lymphocytes Relative: 92 %
Lymphs Abs: 80 10*3/uL — ABNORMAL HIGH (ref 0.7–4.0)
MCH: 24.5 pg — ABNORMAL LOW (ref 26.0–34.0)
MCHC: 31.4 g/dL (ref 30.0–36.0)
MCV: 77.9 fL — ABNORMAL LOW (ref 80.0–100.0)
Monocytes Absolute: 1.6 10*3/uL — ABNORMAL HIGH (ref 0.1–1.0)
Monocytes Relative: 2 %
Neutro Abs: 4.9 10*3/uL (ref 1.7–7.7)
Neutrophils Relative %: 6 %
Platelet Count: 193 10*3/uL (ref 150–400)
RBC: 6.01 MIL/uL — ABNORMAL HIGH (ref 4.22–5.81)
RDW: 15.4 % (ref 11.5–15.5)
Smear Review: NORMAL
WBC Count: 86.9 10*3/uL (ref 4.0–10.5)
nRBC: 0 % (ref 0.0–0.2)

## 2023-12-30 LAB — CMP (CANCER CENTER ONLY)
ALT: 11 U/L (ref 0–44)
AST: 12 U/L — ABNORMAL LOW (ref 15–41)
Albumin: 4.4 g/dL (ref 3.5–5.0)
Alkaline Phosphatase: 54 U/L (ref 38–126)
Anion gap: 6 (ref 5–15)
BUN: 18 mg/dL (ref 6–20)
CO2: 26 mmol/L (ref 22–32)
Calcium: 8.3 mg/dL — ABNORMAL LOW (ref 8.9–10.3)
Chloride: 108 mmol/L (ref 98–111)
Creatinine: 1.11 mg/dL (ref 0.61–1.24)
GFR, Estimated: >60 mL/min (ref >60)
Glucose, Bld: 127 mg/dL — ABNORMAL HIGH (ref 70–99)
Potassium: 4.1 mmol/L (ref 3.5–5.1)
Sodium: 140 mmol/L (ref 135–145)
Total Bilirubin: 0.3 mg/dL (ref 0.0–1.2)
Total Protein: 6.4 g/dL — ABNORMAL LOW (ref 6.5–8.1)

## 2023-12-30 LAB — LACTATE DEHYDROGENASE: LDH: 126 U/L (ref 98–192)

## 2023-12-30 LAB — URIC ACID: Uric Acid, Serum: 4.9 mg/dL (ref 3.7–8.6)

## 2023-12-30 NOTE — Progress Notes (Unsigned)
 Subjective:  Patient ID: James Parrish, male    DOB: 1966-07-21  Age: 58 y.o. MRN: [id number]  CC: Annual Exam, Hypertension, Hyperlipidemia, and Diabetes   HPI James Parrish presents for a CPX and f/up ----  Discussed the use of AI scribe software for clinical note transcription with the patient, who gave verbal consent to proceed.  History of Present Illness   James Parrish is a 58 year old male who presents with symptoms of fatigue, itching, and lymphadenopathy.  He has been experiencing fatigue, itching, and lymphadenopathy, along with night sweats. No chest pain or shortness of breath. His white blood cell count has fluctuated, initially in the nineties, then dropped to sixty-four, and has recently increased to eighty-six. He attributes a previous drop in the count to having had the flu. He is not currently taking any medication for nausea and vomiting, and no new medications have been started for the elevated white blood cell count as treatment plans are pending further evaluation.  Regarding diabetes management, his last A1c was 6.0% a month ago. His insulin dosage was reduced from thirty-four to thirty units. No symptoms of low blood sugar and no recent need to treat any low blood sugar episodes.  He is currently taking indapamide for high blood pressure, with no recent changes to his medication regimen. Blood pressure was recorded at 132/82 mmHg, indicating controlled hypertension.       Outpatient Medications Prior to Visit  Medication Sig Dispense Refill   allopurinol (ZYLOPRIM) 300 MG tablet Take 1 tablet (300 mg total) by mouth daily. 30 tablet 3   amLODipine (NORVASC) 10 MG tablet TAKE 1 TABLET BY MOUTH AT BEDTIME 90 tablet 0   atorvastatin (LIPITOR) 10 MG tablet TAKE 1 TABLET BY MOUTH DAILY 30 tablet 3   dapagliflozin propanediol (FARXIGA) 10 MG TABS tablet Take 1 tablet (10 mg total) by mouth daily before breakfast. 90 tablet 3   diclofenac (VOLTAREN) 75 MG EC tablet  Take 75 mg by mouth 2 (two) times daily.     glucose blood test strip Use as instructed to test blood sugar 2 times daily E11.65    Onetouch strips 100 each 12   indapamide (LOZOL) 1.25 MG tablet TAKE 1 TABLET BY MOUTH DAILY 90 tablet 0   insulin glargine (LANTUS SOLOSTAR) 100 UNIT/ML Solostar Pen Inject 30 Units into the skin daily. 30 mL 6   Insulin Pen Needle 29G X MISC 1 Device by Does not apply route daily in the afternoon. 100 each 3   losartan (COZAAR) 100 MG tablet TAKE 1 TABLET BY MOUTH DAILY 90 tablet 0   metFORMIN (GLUCOPHAGE) 1000 MG tablet Take 1 tablet (1,000 mg total) by mouth 2 (two) times daily with a meal. 180 tablet 3   Multiple Vitamin (MULTIVITAMIN WITH MINERALS) TABS tablet Take 1 tablet by mouth daily.     nebivolol (BYSTOLIC) 10 MG tablet TAKE 1 TABLET BY MOUTH DAILY 90 tablet 0   ondansetron (ZOFRAN) 8 MG tablet Take 1 tablet (8 mg total) by mouth every 8 (eight) hours as needed for nausea or vomiting. 20 tablet 2   oseltamivir (TAMIFLU) 75 MG capsule Take 1 capsule (75 mg total) by mouth 2 (two) times daily. 10 capsule 0   prochlorperazine (COMPAZINE) 10 MG tablet Take 1 tablet (10 mg total) by mouth every 6 (six) hours as needed for nausea or vomiting. 30 tablet 2   promethazine-dextromethorphan (PROMETHAZINE-DM) 6.25-15 MG/5ML syrup Take 5 mLs by mouth 3 (  three) times daily as needed for cough. 200 mL 0   Semaglutide, 2 MG/DOSE, (OZEMPIC, 2 MG/DOSE,) 8 MG/3ML SOPN DIAL AND INJECT UNDER THE SKIN 2 MG WEEKLY 3 mL 1   spironolactone (ALDACTONE) 50 MG tablet TAKE 1 TABLET BY MOUTH DAILY 30 tablet 0   tadalafil (CIALIS) 20 MG tablet TAKE 1 TABLET BY MOUTH DAILY AS NEEDED FOR ERECTILE DYSFUNCTION 10 tablet 2   No facility-administered medications prior to visit.    ROS Review of Systems  Objective:  BP 134/86 (BP Location: Left Arm, Patient Position: Sitting, Cuff Size: Large)   Pulse 88   Temp 97.9 F (36.6 C) (Oral)   Ht 6' 0.5" (1.842 m)   Wt 259 lb 6.4 oz  (117.7 kg)   SpO2 98%   BMI 34.70 kg/m   BP Readings from Last 3 Encounters:  12/30/23 134/86  12/03/23 (!) 136/97  11/29/23 128/80    Wt Readings from Last 3 Encounters:  12/30/23 259 lb 6.4 oz (117.7 kg)  12/03/23 257 lb 4 oz (116.7 kg)  11/25/23 253 lb 9.6 oz (115 kg)    Physical Exam Cardiovascular:     Rate and Rhythm: Normal rate and regular rhythm.     Comments: EKG- SR with 1st degree AV block, 83 bpm ST elevation, early repol NS ST T wave changes Unchanged Musculoskeletal:     Right lower leg: No edema.     Left lower leg: No edema.     Lab Results  Component Value Date   WBC 86.9 (HH) 12/30/2023   HGB 14.7 12/30/2023   HCT 46.8 12/30/2023   PLT 193 12/30/2023   GLUCOSE 127 (H) 12/30/2023   CHOL 103 07/08/2023   TRIG 145.0 07/08/2023   HDL 27.30 (L) 07/08/2023   LDLDIRECT 63.0 11/01/2022   LDLCALC 47 07/08/2023   ALT 11 12/30/2023   AST 12 (L) 12/30/2023   NA 140 12/30/2023   K 4.1 12/30/2023   CL 108 12/30/2023   CREATININE 1.11 12/30/2023   BUN 18 12/30/2023   CO2 26 12/30/2023   TSH 3.48 07/08/2023   PSA 0.39 07/08/2023   HGBA1C 6.0 (A) 11/18/2023   MICROALBUR <0.7 11/01/2022    No results found.   Assessment & Plan:  Type 2 diabetes mellitus without complication, with long-term current use of insulin (HCC) -     Microalbumin / creatinine urine ratio; Future -     Urinalysis, Routine w reflex microscopic; Future  Primary hypertension -     Microalbumin / creatinine urine ratio; Future -     Urinalysis, Routine w reflex microscopic; Future -     EKG 12-Lead  CLL (chronic lymphocytic leukemia) (HCC)  Abnormal electrocardiogram  Immunization due -     Pneumococcal conjugate vaccine 20-valent  Encounter for general adult medical examination with abnormal findings     Follow-up: Return in about 6 months (around 07/01/2024).  Sanda Linger, MD

## 2023-12-30 NOTE — Telephone Encounter (Signed)
 CRITICAL VALUE STICKER  CRITICAL VALUE: WBC 86.8  RECEIVER (on-site recipient of call): Binnie Rail, RN  DATE & TIME NOTIFIED: 8:35 am  MESSENGER (representative from lab): Amber from lab  MD NOTIFIED: Dr. Leonides Schanz  TIME OF NOTIFICATION: 8:42 am  RESPONSE:  acknowledged receipt of lab

## 2023-12-30 NOTE — Patient Instructions (Signed)
 Health Maintenance, Male  Adopting a healthy lifestyle and getting preventive care are important in promoting health and wellness. Ask your health care provider about:  The right schedule for you to have regular tests and exams.  Things you can do on your own to prevent diseases and keep yourself healthy.  What should I know about diet, weight, and exercise?  Eat a healthy diet    Eat a diet that includes plenty of vegetables, fruits, low-fat dairy products, and lean protein.  Do not eat a lot of foods that are high in solid fats, added sugars, or sodium.  Maintain a healthy weight  Body mass index (BMI) is a measurement that can be used to identify possible weight problems. It estimates body fat based on height and weight. Your health care provider can help determine your BMI and help you achieve or maintain a healthy weight.  Get regular exercise  Get regular exercise. This is one of the most important things you can do for your health. Most adults should:  Exercise for at least 150 minutes each week. The exercise should increase your heart rate and make you sweat (moderate-intensity exercise).  Do strengthening exercises at least twice a week. This is in addition to the moderate-intensity exercise.  Spend less time sitting. Even light physical activity can be beneficial.  Watch cholesterol and blood lipids  Have your blood tested for lipids and cholesterol at 60 years of age, then have this test every 5 years.  You may need to have your cholesterol levels checked more often if:  Your lipid or cholesterol levels are high.  You are older than 61 years of age.  You are at high risk for heart disease.  What should I know about cancer screening?  Many types of cancers can be detected early and may often be prevented. Depending on your health history and family history, you may need to have cancer screening at various ages. This may include screening for:  Colorectal cancer.  Prostate cancer.  Skin cancer.  Lung  cancer.  What should I know about heart disease, diabetes, and high blood pressure?  Blood pressure and heart disease  High blood pressure causes heart disease and increases the risk of stroke. This is more likely to develop in people who have high blood pressure readings or are overweight.  Talk with your health care provider about your target blood pressure readings.  Have your blood pressure checked:  Every 3-5 years if you are 9-95 years of age.  Every year if you are 85 years old or older.  If you are between the ages of 29 and 29 and are a current or former smoker, ask your health care provider if you should have a one-time screening for abdominal aortic aneurysm (AAA).  Diabetes  Have regular diabetes screenings. This checks your fasting blood sugar level. Have the screening done:  Once every three years after age 23 if you are at a normal weight and have a low risk for diabetes.  More often and at a younger age if you are overweight or have a high risk for diabetes.  What should I know about preventing infection?  Hepatitis B  If you have a higher risk for hepatitis B, you should be screened for this virus. Talk with your health care provider to find out if you are at risk for hepatitis B infection.  Hepatitis C  Blood testing is recommended for:  Everyone born from [birth year] through 1967.  Anyone  with known risk factors for hepatitis C.  Sexually transmitted infections (STIs)  You should be screened each year for STIs, including gonorrhea and chlamydia, if:  You are sexually active and are younger than 60 years of age.  You are older than 60 years of age and your health care provider tells you that you are at risk for this type of infection.  Your sexual activity has changed since you were last screened, and you are at increased risk for chlamydia or gonorrhea. Ask your health care provider if you are at risk.  Ask your health care provider about whether you are at high risk for HIV. Your health care provider  may recommend a prescription medicine to help prevent HIV infection. If you choose to take medicine to prevent HIV, you should first get tested for HIV. You should then be tested every 3 months for as long as you are taking the medicine.  Follow these instructions at home:  Alcohol use  Do not drink alcohol if your health care provider tells you not to drink.  If you drink alcohol:  Limit how much you have to 0-2 drinks a day.  Know how much alcohol is in your drink. In the [place] one drink equals one 12 oz bottle of beer (355 mL), one 5 oz glass of wine (148 mL), or one 1 oz glass of hard liquor (44 mL).  Lifestyle  Do not use any products that contain nicotine or tobacco. These products include cigarettes, chewing tobacco, and vaping devices, such as e-cigarettes. If you need help quitting, ask your health care provider.  Do not use street drugs.  Do not share needles.  Ask your health care provider for help if you need support or information about quitting drugs.  General instructions  Schedule regular health, dental, and eye exams.  Stay current with your vaccines.  Tell your health care provider if:  You often feel depressed.  You have ever been abused or do not feel safe at home.  Summary  Adopting a healthy lifestyle and getting preventive care are important in promoting health and wellness.  Follow your health care provider's instructions about healthy diet, exercising, and getting tested or screened for diseases.  Follow your health care provider's instructions on monitoring your cholesterol and blood pressure.  This information is not intended to replace advice given to you by your health care provider. Make sure you discuss any questions you have with your health care provider.  Document Revised: 02/20/2021 Document Reviewed: 02/20/2021  Elsevier Patient Education  2024 [employer].

## 2023-12-31 ENCOUNTER — Encounter: Payer: Self-pay | Admitting: Hematology and Oncology

## 2023-12-31 ENCOUNTER — Telehealth: Payer: Self-pay | Admitting: *Deleted

## 2023-12-31 LAB — URINALYSIS, ROUTINE W REFLEX MICROSCOPIC
Bilirubin Urine: NEGATIVE
Hgb urine dipstick: NEGATIVE
Ketones, ur: NEGATIVE
Leukocytes,Ua: NEGATIVE
Nitrite: NEGATIVE
RBC / HPF: NONE SEEN (ref 0–?)
Specific Gravity, Urine: 1.02 (ref 1.000–1.030)
Total Protein, Urine: NEGATIVE
Urine Glucose: >=1000 — AB
Urobilinogen, UA: 0.2 (ref 0.0–1.0)
pH: 6 (ref 5.0–8.0)

## 2023-12-31 NOTE — Telephone Encounter (Signed)
 Connected with James Parrish, regarding FMLA paperwork and ROI emailed through [employer] twice. "I need an intermittent leave of absence for appointments.  Yes I will sign the authorization before tomorrow appointment.".    Paperwork to mail bin for collaborative pick up for provider review, signature and return to this nurse.

## 2024-01-01 ENCOUNTER — Other Ambulatory Visit: Payer: Self-pay

## 2024-01-01 ENCOUNTER — Inpatient Hospital Stay: Admitting: Physician Assistant

## 2024-01-01 ENCOUNTER — Encounter: Payer: Self-pay | Admitting: *Deleted

## 2024-01-01 ENCOUNTER — Telehealth: Payer: Self-pay | Admitting: Internal Medicine

## 2024-01-01 VITALS — BP 144/100 | HR 89 | Temp 98.7°F | Resp 16 | Wt 262.0 lb

## 2024-01-01 DIAGNOSIS — C9110 Chronic lymphocytic leukemia of B-cell type not having achieved remission: Secondary | ICD-10-CM | POA: Diagnosis not present

## 2024-01-01 DIAGNOSIS — Z5112 Encounter for antineoplastic immunotherapy: Secondary | ICD-10-CM | POA: Diagnosis not present

## 2024-01-01 NOTE — Progress Notes (Signed)
 [facility] [phone]   [phone]  PROGRESS NOTE  Patient Care Team: Etta Grandchild, MD as PCP - General (Internal Medicine) Quintella Reichert, MD as PCP - Sleep Medicine (Cardiology)  Hematological/Oncological History # CLL, Rai Stage 0. Del 11 01/25/2020: WBC 9.6, Hgb 15.6, MCV 79.1, Plt 241 05/29/2021: WBC 13.3, Hgb 15.4, MCV 76.9, Plt 203 11/16/2021: patient had routine CBC collected, showed WBC 15.9, Hgb 14.8, MCV 76.4, ALC 11.6 12/14/2021: establish care with Dr. Leonides Schanz  05/24/2023: WBC 43.2, Hgb 14.6, MCV 77.8, Plt 217 11/25/2023: WBC 95.9, Hgb 15.4, MCV 77.8, Plt 221   Interval History:  James Parrish 58 y.o. male with medical history significant for CLL who presents for a follow up visit. The patient's last visit was on 11/24/2022. In the interim, he denies any changes to his health.   On exam today Mr. Sherrin reports he has been having a lot of fatigue in the interim since her last visit.  He is also having some itching and sweats every other night.  He reports that he does feel wet in the mornings.  He does not sleep with a shirt on.  He reports that he is nervous about his rising white blood cell count and is eager to start treatment.  He denies any other B symptoms including fevers, chills, or weight loss. He has no other complaints.  A full 10 point ROS was otherwise negative.  Today we discussed the results of his blood work.  After discussion of the risks and benefits he was eager to proceed forward with treatment.  We will start with obinutuzumab infusions and add in venetoclax with the second cycle.  The patient voiced understanding of our findings and plan moving forward.  MEDICAL HISTORY:  Past Medical History:  Diagnosis Date   CLL (chronic lymphocytic leukemia) (HCC)    Complication of anesthesia    hard to wake up    Diabetes mellitus without complication [facility])    ED (erectile dysfunction)    History of COVID-19 04/2019    Hyperlipidemia    Hypertension    Sleep apnea    uses CPAP nightly    SURGICAL HISTORY: Past Surgical History:  Procedure Laterality Date   BRONCHIAL NEEDLE ASPIRATION BIOPSY  05/29/2021   Procedure: BRONCHIAL NEEDLE ASPIRATION BIOPSIES;  Surgeon: Josephine Igo, DO;  Location: [facility];  Service: Pulmonary;;   COLONOSCOPY     finger surgery Right    Index   KNEE SURGERY Left    KNEE SURGERY Right    ORIF PATELLA Right    REVERSE SHOULDER ARTHROPLASTY Right 08/31/2020   Procedure: REVERSE SHOULDER ARTHROPLASTY;  Surgeon: Bjorn Pippin, MD;  Location: [facility];  Service: Orthopedics;  Laterality: Right;   SHOULDER ARTHROSCOPY WITH ROTATOR CUFF REPAIR Left 02/04/2020   Procedure: SHOULDER ARTHROSCOPY WITH ROTATOR CUFF REPAIR WITH BICEP TENODESIS;  Surgeon: Bjorn Pippin, MD;  Location:  SURGERY CENTER;  Service: Orthopedics;  Laterality: Left;   VIDEO BRONCHOSCOPY WITH ENDOBRONCHIAL ULTRASOUND Left 05/29/2021   Procedure: VIDEO BRONCHOSCOPY WITH ENDOBRONCHIAL ULTRASOUND;  Surgeon: Josephine Igo, DO;  Location: [facility];  Service: Pulmonary;  Laterality: Left;    SOCIAL HISTORY: Social History   Socioeconomic History   Marital status: Married    Spouse name: Not on file   Number of children: Not on file   Years of education: Not on file   Highest education level: 12th grade  Occupational History   Not  on file  Tobacco Use   Smoking status: Former    Current packs/day: 0.00    Average packs/day: 1.5 packs/day for 25.0 years (37.5 ttl pk-yrs)    Types: Cigarettes    Start date: 04/1995    Quit date: 04/2020    Years since quitting: 3.7    Passive exposure: Past   Smokeless tobacco: Never  Vaping Use   Vaping status: Never Used  Substance and Sexual Activity   Alcohol use: Not Currently    Comment: occasionally   Drug use: Never   Sexual activity: Yes    Partners: Female  Other Topics Concern   Not on file  Social History  Narrative   Not on file   Social Drivers of Health   Financial Resource Strain: High Risk (12/28/2023)   Overall Financial Resource Strain (CARDIA)    Difficulty of Paying Living Expenses: Very hard  Food Insecurity: Food Insecurity Present (12/28/2023)   Hunger Vital Sign    Worried About Running Out of Food in the Last Year: Often true    Ran Out of Food in the Last Year: Often true  Transportation Needs: No Transportation Needs (12/28/2023)   PRAPARE - Administrator, Civil Service (Medical): No    Lack of Transportation (Non-Medical): No  Physical Activity: Insufficiently Active (12/28/2023)   Exercise Vital Sign    Days of Exercise per Week: 3 days    Minutes of Exercise per Session: 10 min  Stress: Stress Concern Present (12/28/2023)   [employer] of Occupational Health - Occupational Stress Questionnaire    Feeling of Stress : To some extent  Social Connections: Socially Isolated (12/28/2023)   Social Connection and Isolation Panel [NHANES]    Frequency of Communication with Friends and Family: Twice a week    Frequency of Social Gatherings with Friends and Family: Never    Attends Religious Services: Never    Diplomatic Services operational officer: No    Attends Engineer, structural: Not on file    Marital Status: Married  Catering manager Violence: Not on file    FAMILY HISTORY: Family History  Problem Relation Age of Onset   Cancer Mother        breast cancer    Hypertension Mother    Diabetes Mother    Venous thrombosis Son    Colon cancer Neg Hx    Esophageal cancer Neg Hx    Stomach cancer Neg Hx    Rectal cancer Neg Hx     ALLERGIES:  has no known allergies.  MEDICATIONS:  Current Outpatient Medications  Medication Sig Dispense Refill   allopurinol (ZYLOPRIM) 300 MG tablet Take 1 tablet (300 mg total) by mouth daily. 30 tablet 3   amLODipine (NORVASC) 10 MG tablet TAKE 1 TABLET BY MOUTH AT BEDTIME 90 tablet 0   atorvastatin  (LIPITOR) 10 MG tablet TAKE 1 TABLET BY MOUTH DAILY 30 tablet 3   dapagliflozin propanediol (FARXIGA) 10 MG TABS tablet Take 1 tablet (10 mg total) by mouth daily before breakfast. 90 tablet 3   diclofenac (VOLTAREN) 75 MG EC tablet Take 75 mg by mouth 2 (two) times daily.     glucose blood test strip Use as instructed to test blood sugar 2 times daily E11.65    Onetouch strips 100 each 12   indapamide (LOZOL) 1.25 MG tablet TAKE 1 TABLET BY MOUTH DAILY 90 tablet 0   insulin glargine (LANTUS SOLOSTAR) 100 UNIT/ML Solostar Pen Inject 30 Units  into the skin daily. 30 mL 6   Insulin Pen Needle 29G X MISC 1 Device by Does not apply route daily in the afternoon. 100 each 3   losartan (COZAAR) 100 MG tablet TAKE 1 TABLET BY MOUTH DAILY 90 tablet 0   metFORMIN (GLUCOPHAGE) 1000 MG tablet Take 1 tablet (1,000 mg total) by mouth 2 (two) times daily with a meal. 180 tablet 3   Multiple Vitamin (MULTIVITAMIN WITH MINERALS) TABS tablet Take 1 tablet by mouth daily.     nebivolol (BYSTOLIC) 10 MG tablet TAKE 1 TABLET BY MOUTH DAILY 90 tablet 0   ondansetron (ZOFRAN) 8 MG tablet Take 1 tablet (8 mg total) by mouth every 8 (eight) hours as needed for nausea or vomiting. 20 tablet 2   oseltamivir (TAMIFLU) 75 MG capsule Take 1 capsule (75 mg total) by mouth 2 (two) times daily. 10 capsule 0   prochlorperazine (COMPAZINE) 10 MG tablet Take 1 tablet (10 mg total) by mouth every 6 (six) hours as needed for nausea or vomiting. 30 tablet 2   promethazine-dextromethorphan (PROMETHAZINE-DM) 6.25-15 MG/5ML syrup Take 5 mLs by mouth 3 (three) times daily as needed for cough. 200 mL 0   Semaglutide, 2 MG/DOSE, (OZEMPIC, 2 MG/DOSE,) 8 MG/3ML SOPN DIAL AND INJECT UNDER THE SKIN 2 MG WEEKLY 3 mL 1   spironolactone (ALDACTONE) 50 MG tablet TAKE 1 TABLET BY MOUTH DAILY 30 tablet 0   tadalafil (CIALIS) 20 MG tablet TAKE 1 TABLET BY MOUTH DAILY AS NEEDED FOR ERECTILE DYSFUNCTION 10 tablet 2   No current facility-administered  medications for this visit.    REVIEW OF SYSTEMS:   Constitutional: ( - ) fevers, ( - )  chills , ( - ) night sweats Eyes: ( - ) blurriness of vision, ( - ) double vision, ( - ) watery eyes Ears, nose, mouth, throat, and face: ( - ) mucositis, ( - ) sore throat Respiratory: ( - ) cough, ( - ) dyspnea, ( - ) wheezes Cardiovascular: ( - ) palpitation, ( - ) chest discomfort, ( - ) lower extremity swelling Gastrointestinal:  ( - ) nausea, ( - ) heartburn, ( - ) change in bowel habits Skin: ( - ) abnormal skin rashes Lymphatics: ( - ) new lymphadenopathy, ( - ) easy bruising Neurological: ( - ) numbness, ( - ) tingling, ( - ) new weaknesses Behavioral/Psych: ( - ) mood change, ( - ) new changes  All other systems were reviewed with the patient and are negative.  PHYSICAL EXAMINATION: ECOG PERFORMANCE STATUS: 0 - Asymptomatic  Vitals:   01/01/24 0935  BP: (!) 144/100  Pulse: 89  Resp: 16  Temp: 98.7 F (37.1 C)  SpO2: 96%     Filed Weights   01/01/24 0935  Weight: 262 lb (118.8 kg)      GENERAL: Well-appearing middle-aged African-American male alert, no distress and comfortable SKIN: skin color, texture, turgor are normal, no rashes or significant lesions EYES: conjunctiva are pink and non-injected, sclera clear NECK: supple, non-tender LYMPH: Mildly prominent left-sided cervical lymph nodes, no other overt lymphadenopathy noted on exam LUNGS: clear to auscultation and percussion with normal breathing effort HEART: regular rate & rhythm and no murmurs and no lower extremity edema Musculoskeletal: no cyanosis of digits and no clubbing  PSYCH: alert & oriented x 3, fluent speech NEURO: no focal motor/sensory deficits  LABORATORY DATA:  I have reviewed the data as listed    Latest Ref Rng & Units 12/30/2023  7:55 AM 12/09/2023    7:42 AM 12/03/2023    9:05 AM  CBC  WBC 4.0 - 10.5 K/uL 86.9  77.8  64.2   Hemoglobin 13.0 - 17.0 g/dL 08.6  57.8  46.9   Hematocrit 39.0 -  52.0 % 46.8  45.9  45.2   Platelets 150 - 400 K/uL 193  283  183        Latest Ref Rng & Units 12/30/2023    7:55 AM 12/09/2023    7:42 AM 12/03/2023    9:05 AM  CMP  Glucose 70 - 99 mg/dL 629  528  98   BUN 6 - 20 mg/dL 18  17  11    Creatinine 0.61 - 1.24 mg/dL 4.13  2.44  0.10   Sodium 135 - 145 mmol/L 140  141  143   Potassium 3.5 - 5.1 mmol/L 4.1  3.8  3.9   Chloride 98 - 111 mmol/L 108  107  107   CO2 22 - 32 mmol/L 26  26  30    Calcium 8.9 - 10.3 mg/dL 8.3  8.8  8.7   Total Protein 6.5 - 8.1 g/dL 6.4  6.6  6.2   Total Bilirubin 0.0 - 1.2 mg/dL 0.3  0.3  0.3   Alkaline Phos 38 - 126 U/L 54  49  53   AST 15 - 41 U/L 12  14  14    ALT 0 - 44 U/L 11  16  14      RADIOGRAPHIC STUDIES:  No results found.  ASSESSMENT & PLAN SHAKEEM STERN 58 y.o. male with medical history significant for CLL who presents for a follow up visit.  # CLL, Rai Stage 0 -- Findings at this time most consistent with a CLL Rai stage 0 --Prognostic panel showed del 11 predominately (small population of deletion 13 noted as well). IgVH negative, ZAP 70 positive  --Labs show white blood cell count 86.9, hemoglobin 14.7, MCV 77.9, platelets 193 -- White blood cell count decreased after his flu infection subsided, however now they are rising again.  Due to the marked rise in the white blood cell count patient meets criteria for treatment based on white blood cell doubling time. -- In the event patient requires treatment recommended [place] versus venetoclax/obinutuzumab.  After discussing the risks and benefits of both options he noted he would prefer the limited duration of therapy offered by venetoclax and obinutuzumab.   --RTC next week for the start of treatment.  Orders Placed This Encounter  Procedures   CBC with Differential (Cancer Center Only)    Standing Status:   Future    Expected Date:   01/09/2024    Expiration Date:   01/08/2025   CMP (Cancer Center only)    Standing Status:   Future     Expected Date:   01/09/2024    Expiration Date:   01/08/2025    All questions were answered. The patient knows to call the clinic with any problems, questions or concerns.  I have spent a total of 30 minutes minutes of face-to-face and non-face-to-face time, preparing to see the patient,  performing a medically appropriate examination, counseling and educating the patient, documenting clinical information in the electronic health record, and care coordination.   Ulysees Barns, MD Department of Hematology/Oncology [facility] Phone: [phone] Pager: [phone] Email: [email or web address]   01/01/2024 4:49 PM  Hallek Sharlot Gowda BD, Catovsky D, Caligaris-Cappio F, Dighiero G, Dhner H, [employer]  Shawn Stall, [place] E, Chiorazzi N, [place], Rai KR, Holden, Eichhorst B, O'Brien S, Robak T, Seymour JF, Kipps TJ. iwCLL guidelines for diagnosis, indications for treatment, response assessment, and supportive management of CLL. Blood. 2018 Jun 21;131(25):2745-2760.  Active disease should be clearly documented to initiate therapy. At least 1 of the following criteria should be met.  1) Evidence of progressive marrow failure as manifested by the development of, or worsening of, anemia and/or thrombocytopenia. Cutoff levels of Hb <10 g/dL or platelet counts <161  109/L are generally regarded as indication for treatment. However, in some patients, platelet counts <100  109/L may remain stable over a long period; this situation does not automatically require therapeutic intervention. 2) Massive (ie, >=6 cm below the left costal margin) or progressive or symptomatic splenomegaly. 3) Massive nodes (ie, >=10 cm in longest diameter) or progressive or symptomatic lymphadenopathy. 4) Progressive lymphocytosis with an increase of >=50% over a 68-month period, or lymphocyte doubling time (LDT) <6 months. LDT can be obtained by linear regression  extrapolation of absolute lymphocyte counts obtained at intervals of 2 weeks over an observation period of 2 to 3 months; patients with initial blood lymphocyte counts <30  109/L may require a longer observation period to determine the LDT. Factors contributing to lymphocytosis other than CLL (eg, infections, steroid administration) should be excluded. 5) Autoimmune complications including anemia or thrombocytopenia poorly responsive to corticosteroids. 6) Symptomatic or functional extranodal involvement (eg, skin, kidney, lung, spine). Disease-related symptoms as defined by any of the following: Unintentional weight loss >=10% within the previous 6 months. Significant fatigue (ie, ECOG performance scale 2 or worse; cannot work or unable to perform usual activities). Fevers >=100.32F or 38.0C for 2 or more weeks without evidence of infection. Night sweats for >=1 month without evidence of infection.

## 2024-01-01 NOTE — Telephone Encounter (Signed)
 Patient dropped off D.O.T. paperwork for completion - placed in provider box at front desk

## 2024-01-02 NOTE — Telephone Encounter (Signed)
 LM that paper is completed and has been placed up front

## 2024-01-07 ENCOUNTER — Other Ambulatory Visit: Payer: Self-pay | Admitting: Hematology and Oncology

## 2024-01-07 DIAGNOSIS — C9110 Chronic lymphocytic leukemia of B-cell type not having achieved remission: Secondary | ICD-10-CM

## 2024-01-07 NOTE — Progress Notes (Signed)
 Pharmacist Chemotherapy Monitoring - Initial Assessment    Anticipated start date: 01/14/24   The following has been reviewed per standard work regarding the patient's treatment regimen: The patient's diagnosis, treatment plan and drug doses, and organ/hematologic function Lab orders and baseline tests specific to treatment regimen  The treatment plan start date, drug sequencing, and pre-medications Prior authorization status  Patient's documented medication list, including drug-drug interaction screen and prescriptions for anti-emetics and supportive care specific to the treatment regimen The drug concentrations, fluid compatibility, administration routes, and timing of the medications to be used The patient's access for treatment and lifetime cumulative dose history, if applicable  The patient's medication allergies and previous infusion related reactions, if applicable   Changes made to treatment plan:  treatment plan date  Follow up needed:  Hep B serum testing - msg'd Dr. Leonides Schanz to review for lab draw prior to initiation of Gazyva.   Ebony Hail, Pharm.D., CPP 01/07/2024@2 :18 PM

## 2024-01-10 MED FILL — Dexamethasone Sodium Phosphate Inj 100 MG/10ML: INTRAMUSCULAR | Qty: 2 | Status: AC

## 2024-01-11 ENCOUNTER — Other Ambulatory Visit: Payer: Self-pay | Admitting: Internal Medicine

## 2024-01-12 ENCOUNTER — Other Ambulatory Visit: Payer: Self-pay | Admitting: Internal Medicine

## 2024-01-12 DIAGNOSIS — N5201 Erectile dysfunction due to arterial insufficiency: Secondary | ICD-10-CM

## 2024-01-13 ENCOUNTER — Inpatient Hospital Stay

## 2024-01-13 ENCOUNTER — Telehealth: Payer: Self-pay

## 2024-01-13 ENCOUNTER — Ambulatory Visit: Admitting: Physician Assistant

## 2024-01-13 ENCOUNTER — Other Ambulatory Visit: Payer: Self-pay | Admitting: Hematology and Oncology

## 2024-01-13 ENCOUNTER — Encounter: Payer: Self-pay | Admitting: Hematology and Oncology

## 2024-01-13 VITALS — BP 124/85 | HR 99 | Temp 98.2°F | Resp 17

## 2024-01-13 VITALS — BP 137/85 | HR 95 | Resp 16

## 2024-01-13 DIAGNOSIS — T50905A Adverse effect of unspecified drugs, medicaments and biological substances, initial encounter: Secondary | ICD-10-CM | POA: Diagnosis not present

## 2024-01-13 DIAGNOSIS — C9110 Chronic lymphocytic leukemia of B-cell type not having achieved remission: Secondary | ICD-10-CM

## 2024-01-13 DIAGNOSIS — Z5112 Encounter for antineoplastic immunotherapy: Secondary | ICD-10-CM | POA: Diagnosis not present

## 2024-01-13 LAB — CMP (CANCER CENTER ONLY)
ALT: 14 U/L (ref 0–44)
AST: 12 U/L — ABNORMAL LOW (ref 15–41)
Albumin: 4.5 g/dL (ref 3.5–5.0)
Alkaline Phosphatase: 50 U/L (ref 38–126)
Anion gap: 9 (ref 5–15)
BUN: 18 mg/dL (ref 6–20)
CO2: 22 mmol/L (ref 22–32)
Calcium: 9.1 mg/dL (ref 8.9–10.3)
Chloride: 110 mmol/L (ref 98–111)
Creatinine: 1.02 mg/dL (ref 0.61–1.24)
GFR, Estimated: >60 mL/min (ref >60)
Glucose, Bld: 143 mg/dL — ABNORMAL HIGH (ref 70–99)
Potassium: 3.9 mmol/L (ref 3.5–5.1)
Sodium: 141 mmol/L (ref 135–145)
Total Bilirubin: 0.3 mg/dL (ref 0.0–1.2)
Total Protein: 6.4 g/dL — ABNORMAL LOW (ref 6.5–8.1)

## 2024-01-13 LAB — CBC WITH DIFFERENTIAL (CANCER CENTER ONLY)
Abs Immature Granulocytes: 0.16 10*3/uL — ABNORMAL HIGH (ref 0.00–0.07)
Basophils Absolute: 0.1 10*3/uL (ref 0.0–0.1)
Basophils Relative: 0 %
Eosinophils Absolute: 0.1 10*3/uL (ref 0.0–0.5)
Eosinophils Relative: 0 %
HCT: 43.8 % (ref 39.0–52.0)
Hemoglobin: 13.7 g/dL (ref 13.0–17.0)
Immature Granulocytes: 0 %
Lymphocytes Relative: 91 %
Lymphs Abs: 74.4 10*3/uL — ABNORMAL HIGH (ref 0.7–4.0)
MCH: 24.1 pg — ABNORMAL LOW (ref 26.0–34.0)
MCHC: 31.3 g/dL (ref 30.0–36.0)
MCV: 77.1 fL — ABNORMAL LOW (ref 80.0–100.0)
Monocytes Absolute: 2.2 10*3/uL — ABNORMAL HIGH (ref 0.1–1.0)
Monocytes Relative: 3 %
Neutro Abs: 4.9 10*3/uL (ref 1.7–7.7)
Neutrophils Relative %: 6 %
Platelet Count: 201 10*3/uL (ref 150–400)
RBC: 5.68 MIL/uL (ref 4.22–5.81)
RDW: 15.3 % (ref 11.5–15.5)
Smear Review: NORMAL
WBC Count: 81.8 10*3/uL (ref 4.0–10.5)
nRBC: 0 % (ref 0.0–0.2)

## 2024-01-13 LAB — HEPATITIS B SURFACE ANTIBODY,QUALITATIVE: Hep B S Ab: NONREACTIVE

## 2024-01-13 LAB — HEPATITIS B SURFACE ANTIGEN: Hepatitis B Surface Ag: NONREACTIVE

## 2024-01-13 LAB — LACTATE DEHYDROGENASE: LDH: 150 U/L (ref 98–192)

## 2024-01-13 MED ORDER — DIPHENHYDRAMINE HCL 50 MG/ML IJ SOLN
50.0000 mg | Freq: Once | INTRAMUSCULAR | Status: AC
  Administered 2024-01-13: 50 mg via INTRAVENOUS
  Filled 2024-01-13: qty 1

## 2024-01-13 MED ORDER — FAMOTIDINE IN NACL 20-0.9 MG/50ML-% IV SOLN
20.0000 mg | Freq: Once | INTRAVENOUS | Status: AC | PRN
  Administered 2024-01-13: 20 mg via INTRAVENOUS

## 2024-01-13 MED ORDER — FAMOTIDINE IN NACL 20-0.9 MG/50ML-% IV SOLN
20.0000 mg | Freq: Once | INTRAVENOUS | Status: AC
  Administered 2024-01-13: 20 mg via INTRAVENOUS

## 2024-01-13 MED ORDER — SODIUM CHLORIDE 0.9 % IV SOLN
20.0000 mg | Freq: Once | INTRAVENOUS | Status: AC
  Administered 2024-01-13: 20 mg via INTRAVENOUS
  Filled 2024-01-13: qty 20

## 2024-01-13 MED ORDER — SODIUM CHLORIDE 0.9 % IV SOLN
INTRAVENOUS | Status: DC
Start: 2024-01-13 — End: 2024-01-13

## 2024-01-13 MED ORDER — SODIUM CHLORIDE 0.9 % IV SOLN
20.0000 mg | Freq: Once | INTRAVENOUS | Status: DC
  Filled 2024-01-13: qty 2

## 2024-01-13 MED ORDER — METHYLPREDNISOLONE SODIUM SUCC 125 MG IJ SOLR
125.0000 mg | Freq: Once | INTRAMUSCULAR | Status: AC | PRN
  Administered 2024-01-13: 125 mg via INTRAVENOUS

## 2024-01-13 MED ORDER — ACETAMINOPHEN 325 MG PO TABS
650.0000 mg | ORAL_TABLET | Freq: Once | ORAL | Status: AC
  Administered 2024-01-13: 650 mg via ORAL
  Filled 2024-01-13: qty 2

## 2024-01-13 MED ORDER — SODIUM CHLORIDE 0.9 % IV SOLN
100.0000 mg | Freq: Once | INTRAVENOUS | Status: AC
  Administered 2024-01-13: 100 mg via INTRAVENOUS
  Filled 2024-01-13: qty 4

## 2024-01-13 MED ORDER — SODIUM CHLORIDE 0.9 % IV SOLN: Freq: Once | INTRAVENOUS | Status: DC | PRN

## 2024-01-13 MED FILL — Dexamethasone Sodium Phosphate Inj 100 MG/10ML: INTRAMUSCULAR | Qty: 2 | Status: AC

## 2024-01-13 NOTE — Patient Instructions (Addendum)
 [facility] - A DEPT OF [facility]  Discharge Instructions: Thank you for choosing [facility] to provide your oncology and hematology care.   If you have a lab appointment with the Cancer Center, please go directly to the Cancer Center and check in at the registration area.   Wear comfortable clothing and clothing appropriate for easy access to any Portacath or PICC line.   We strive to give you quality time with your provider. You may need to reschedule your appointment if you arrive late (15 or more minutes).  Arriving late affects you and other patients whose appointments are after yours.  Also, if you miss three or more appointments without notifying the office, you may be dismissed from the clinic at the provider's discretion.      For prescription refill requests, have your pharmacy contact our office and allow 72 hours for refills to be completed.    Today you received the following chemotherapy and/or immunotherapy agents gazyva      To help prevent nausea and vomiting after your treatment, we encourage you to take your nausea medication as directed.  BELOW ARE SYMPTOMS THAT SHOULD BE REPORTED IMMEDIATELY: *FEVER GREATER THAN 100.4 F (38 C) OR HIGHER *CHILLS OR SWEATING *NAUSEA AND VOMITING THAT IS NOT CONTROLLED WITH YOUR NAUSEA MEDICATION *UNUSUAL SHORTNESS OF BREATH *UNUSUAL BRUISING OR BLEEDING *URINARY PROBLEMS (pain or burning when urinating, or frequent urination) *BOWEL PROBLEMS (unusual diarrhea, constipation, pain near the anus) TENDERNESS IN MOUTH AND THROAT WITH OR WITHOUT PRESENCE OF ULCERS (sore throat, sores in mouth, or a toothache) UNUSUAL RASH, SWELLING OR PAIN  UNUSUAL VAGINAL DISCHARGE OR ITCHING   Items with * indicate a potential emergency and should be followed up as soon as possible or go to the Emergency Department if any problems should occur.  Please show the CHEMOTHERAPY ALERT CARD or IMMUNOTHERAPY  ALERT CARD at check-in to the Emergency Department and triage nurse.  Should you have questions after your visit or need to cancel or reschedule your appointment, please contact [facility] - A DEPT OF [facility]  Dept: [phone]  and follow the prompts.  Office hours are 8:00 a.m. to 4:30 p.m. Monday - Friday. Please note that voicemails left after 4:00 p.m. may not be returned until the following business day.  We are closed weekends and major holidays. You have access to a nurse at all times for urgent questions. Please call the main number to the clinic Dept: [phone] and follow the prompts.   For any non-urgent questions, you may also contact your provider using MyChart. We now offer e-Visits for anyone 59 and older to request care online for non-urgent symptoms. For details visit [email or web address].   Also download the MyChart app! Go to the app store, search "MyChart", open the app, select [facility], and log in with your MyChart username and password.  Obinutuzumab Injection What is this medication? OBINUTUZUMAB (OH bi nue TOOZ ue mab) treats leukemia and lymphoma. It works by blocking a protein that causes cancer cells to grow and multiply. This helps to slow or stop the spread of cancer cells. It is a monoclonal antibody. This medicine may be used for other purposes; ask your health care provider or pharmacist if you have questions. COMMON BRAND NAME(S): GAZYVA What should I tell my care team before I take this medication? They need to know if you have any of these  conditions: Heart disease Infection, especially a viral infection, such as hepatitis B Lung or breathing disease Take medications that treat or prevent blood clots An unusual or allergic reaction to obinutuzumab, other medications, foods, dyes, or preservatives Pregnant or trying to get pregnant Breastfeeding How should I use this medication? This medication is for infusion  into a vein. It is given by a care team in a hospital or clinic setting. Talk to your care team about the use of this medication in children. Special care may be needed. Overdosage: If you think you have taken too much of this medicine contact a poison control center or emergency room at once. NOTE: This medicine is only for you. Do not share this medicine with others. What if I miss a dose? Keep appointments for follow-up doses as directed. It is important not to miss your dose. Call your care team if you are unable to keep an appointment. What may interact with this medication? Live virus vaccines This list may not describe all possible interactions. Give your health care provider a list of all the medicines, herbs, non-prescription drugs, or dietary supplements you use. Also tell them if you smoke, drink alcohol, or use illegal drugs. Some items may interact with your medicine. What should I watch for while using this medication? Report any side effects that you notice during your treatment right away, such as changes in your breathing, fever, chills, dizziness or lightheadedness. These effects are more common with the first dose. Visit your care team for checks on your progress. You will need to have regular blood work. Report any other side effects. The side effects of this medication can continue after you finish your treatment. Continue your course of treatment even though you feel ill unless your care team tells you to stop. Call your care team for advice if you get a fever, chills or sore throat, or other symptoms of a cold or flu. Do not treat yourself. This medication decreases your body's ability to fight infections. Try to avoid being around people who are sick. This medication may increase your risk to bruise or bleed. Call your care team if you notice any unusual bleeding. Do not become pregnant while taking this medication or for 6 months after stopping it. Inform your care team if you  wish to become pregnant or think you might be pregnant. There is a potential for serious side effects to an unborn child. Talk to your care team or pharmacist for more information. Do not breast-feed an infant while taking this medication or for 6 months after stopping it. What side effects may I notice from receiving this medication? Side effects that you should report to your care team as soon as possible: Allergic reactions--skin rash, itching, hives, swelling of the face, lips, tongue, or throat Bleeding--bloody or black, tar-like stools, vomiting blood or brown material that looks like coffee grounds, red or dark brown urine, small red or purple spots on skin, unusual bruising or bleeding Blood clot--pain, swelling, or warmth in the leg, shortness of breath, chest pain Dizziness, loss of balance or coordination, confusion or trouble speaking Infection--fever, chills, cough, sore throat, wounds that don't heal, pain or trouble when passing urine, general feeling of discomfort or being unwell Infusion reactions--chest pain, shortness of breath or trouble breathing, feeling faint or lightheaded Liver injury--right upper belly pain, loss of appetite, nausea, light-colored stool, dark yellow or brown urine, yellowing skin or eyes, unusual weakness or fatigue Tumor lysis syndrome (TLS)--nausea, vomiting, diarrhea, decrease  in the amount of urine, dark urine, unusual weakness or fatigue, confusion, muscle pain or cramps, fast or irregular heartbeat, joint pain Side effects that usually do not require medical attention (report to your care team if they continue or are bothersome): Bone, joint, or muscle pain Constipation Diarrhea Fatigue Runny or stuffy nose Sore throat This list may not describe all possible side effects. Call your doctor for medical advice about side effects. You may report side effects to FDA at 1-800-FDA-1088. Where should I keep my medication? This medication is only given in a  hospital or clinic and will not be stored at home. NOTE: This sheet is a summary. It may not cover all possible information. If you have questions about this medicine, talk to your doctor, pharmacist, or health care provider.  2024 Elsevier/Gold Standard (2022-02-21 00:00:00)

## 2024-01-13 NOTE — Progress Notes (Signed)
    DATE:  01/13/24                                        X CHEMO/IMMUNOTHERAPY REACTION           MD: Leonides Schanz   AGENT/BLOOD PRODUCT RECEIVING TODAY:              Gazyva   AGENT/BLOOD PRODUCT RECEIVING IMMEDIATELY PRIOR TO REACTION:          Gazyva    Vitals:   01/13/24 1310 01/13/24 1319  BP: (!) 143/94 (!) 135/95  Pulse: 81 83  Resp: 16 16  SpO2: 100%       REACTION(S):           back pain   PREMEDS:    Tylenol 650 mg PO, Decadron 20 mg IV, Benadryl 50 mg IV  INTERVENTION: Pepcid 20 mg IV x2, solu-medrol 125 mg IV   Review of Systems  Review of Systems  Musculoskeletal:  Positive for back pain.  All other systems reviewed and are negative.    Physical Exam  Physical Exam Vitals and nursing note reviewed.  Constitutional:      Appearance: He is not ill-appearing or toxic-appearing.  HENT:     Head: Normocephalic.  Eyes:     Conjunctiva/sclera: Conjunctivae normal.  Cardiovascular:     Rate and Rhythm: Normal rate and regular rhythm.     Pulses: Normal pulses.     Heart sounds: Normal heart sounds.  Pulmonary:     Effort: Pulmonary effort is normal.     Breath sounds: Normal breath sounds.  Abdominal:     General: There is no distension.  Musculoskeletal:     Cervical back: Normal range of motion.  Skin:    General: Skin is warm and dry.  Neurological:     Mental Status: He is alert.    OUTCOME:                  Patient became symptomatic approximately 2 hours minutes into infusion.  Back pain resolved after IV Pepcid was administered.  Treatment was resumed.  Near the end of infusion patient again reporting back pain and Solu-Medrol was administered and there was no improvement. Pepcid 20 mg IV administered and pain resolved instantly.  Patient tolerated remainder of treatment. Dr. Leonides Schanz notified and agrees with plan to add pepcid as pre medication for future treatments.  I have spent a total of 20 minutes minutes of face-to-face and non-face-to-face  time preparing to see the patient, performing a medically appropriate examination, counseling and educating the patient, ordering medications, documenting clinical information in the electronic health record, and care coordination.

## 2024-01-13 NOTE — Progress Notes (Signed)
 Guilford Shi., RN: "Pt c/o 6/10 low back pain 2 hours after starting first time gazyva. Kate at chair side. Back pain relieved with emergency meds. See MAR for details. Stopped and restarted infusion several times due to mild back pain"  At 1630, report received from [place], [place]. Pt was able to tolerating remaining gazyva well. Discharged with VSS, ambulatory to lobby.

## 2024-01-13 NOTE — Telephone Encounter (Signed)
 CRITICAL VALUE STICKER  CRITICAL VALUE:  WBC  81.8  RECEIVER (on-site recipient of call):  Daneil Dolin, LPN  DATE & TIME NOTIFIED: 01/13/24  08:51  MESSENGER (representative from lab):  Melissa  MD NOTIFIED: Jeanie Sewer, MD  TIME OF NOTIFICATION:  08:51  RESPONSE:

## 2024-01-14 ENCOUNTER — Inpatient Hospital Stay: Attending: Hematology and Oncology

## 2024-01-14 ENCOUNTER — Encounter: Payer: Self-pay | Admitting: Hematology and Oncology

## 2024-01-14 VITALS — BP 132/72 | HR 78 | Temp 98.2°F | Resp 18

## 2024-01-14 DIAGNOSIS — Z803 Family history of malignant neoplasm of breast: Secondary | ICD-10-CM | POA: Diagnosis not present

## 2024-01-14 DIAGNOSIS — C9110 Chronic lymphocytic leukemia of B-cell type not having achieved remission: Secondary | ICD-10-CM | POA: Insufficient documentation

## 2024-01-14 DIAGNOSIS — Z87891 Personal history of nicotine dependence: Secondary | ICD-10-CM | POA: Diagnosis not present

## 2024-01-14 DIAGNOSIS — R5383 Other fatigue: Secondary | ICD-10-CM | POA: Diagnosis not present

## 2024-01-14 DIAGNOSIS — Z5112 Encounter for antineoplastic immunotherapy: Secondary | ICD-10-CM | POA: Diagnosis present

## 2024-01-14 LAB — HEPATITIS B CORE ANTIBODY, TOTAL: HEP B CORE AB: NEGATIVE

## 2024-01-14 MED ORDER — SODIUM CHLORIDE 0.9 % IV SOLN
900.0000 mg | Freq: Once | INTRAVENOUS | Status: AC
  Administered 2024-01-14: 900 mg via INTRAVENOUS
  Filled 2024-01-14: qty 36

## 2024-01-14 MED ORDER — DIPHENHYDRAMINE HCL 50 MG/ML IJ SOLN
50.0000 mg | Freq: Once | INTRAMUSCULAR | Status: AC
  Administered 2024-01-14: 50 mg via INTRAVENOUS
  Filled 2024-01-14: qty 1

## 2024-01-14 MED ORDER — FAMOTIDINE IN NACL 20-0.9 MG/50ML-% IV SOLN
20.0000 mg | Freq: Once | INTRAVENOUS | Status: AC
  Administered 2024-01-14: 20 mg via INTRAVENOUS
  Filled 2024-01-14: qty 50

## 2024-01-14 MED ORDER — ACETAMINOPHEN 325 MG PO TABS
650.0000 mg | ORAL_TABLET | Freq: Once | ORAL | Status: AC
  Administered 2024-01-14: 650 mg via ORAL
  Filled 2024-01-14: qty 2

## 2024-01-14 MED ORDER — SODIUM CHLORIDE 0.9 % IV SOLN: INTRAVENOUS | Status: DC

## 2024-01-14 MED ORDER — METHYLPREDNISOLONE SODIUM SUCC 125 MG IJ SOLR
125.0000 mg | Freq: Once | INTRAMUSCULAR | Status: AC
  Administered 2024-01-14: 125 mg via INTRAVENOUS
  Filled 2024-01-14: qty 2

## 2024-01-17 MED FILL — Dexamethasone Sodium Phosphate Inj 100 MG/10ML: INTRAMUSCULAR | Qty: 2 | Status: AC

## 2024-01-20 ENCOUNTER — Other Ambulatory Visit: Payer: Self-pay | Admitting: Hematology and Oncology

## 2024-01-20 ENCOUNTER — Inpatient Hospital Stay

## 2024-01-20 ENCOUNTER — Encounter: Payer: Self-pay | Admitting: Hematology and Oncology

## 2024-01-20 VITALS — BP 132/76 | HR 94 | Temp 97.9°F | Resp 18 | Wt 262.2 lb

## 2024-01-20 DIAGNOSIS — C9110 Chronic lymphocytic leukemia of B-cell type not having achieved remission: Secondary | ICD-10-CM

## 2024-01-20 DIAGNOSIS — Z5112 Encounter for antineoplastic immunotherapy: Secondary | ICD-10-CM | POA: Diagnosis not present

## 2024-01-20 LAB — CMP (CANCER CENTER ONLY)
ALT: 18 U/L (ref 0–44)
AST: 18 U/L (ref 15–41)
Albumin: 4.4 g/dL (ref 3.5–5.0)
Alkaline Phosphatase: 55 U/L (ref 38–126)
Anion gap: 7 (ref 5–15)
BUN: 18 mg/dL (ref 6–20)
CO2: 24 mmol/L (ref 22–32)
Calcium: 9.2 mg/dL (ref 8.9–10.3)
Chloride: 108 mmol/L (ref 98–111)
Creatinine: 1.01 mg/dL (ref 0.61–1.24)
GFR, Estimated: >60 mL/min (ref >60)
Glucose, Bld: 133 mg/dL — ABNORMAL HIGH (ref 70–99)
Potassium: 3.9 mmol/L (ref 3.5–5.1)
Sodium: 139 mmol/L (ref 135–145)
Total Bilirubin: 0.3 mg/dL (ref 0.0–1.2)
Total Protein: 6.9 g/dL (ref 6.5–8.1)

## 2024-01-20 LAB — CBC WITH DIFFERENTIAL (CANCER CENTER ONLY)
Abs Immature Granulocytes: 0.42 10*3/uL — ABNORMAL HIGH (ref 0.00–0.07)
Basophils Absolute: 0.1 10*3/uL (ref 0.0–0.1)
Basophils Relative: 1 %
Eosinophils Absolute: 0.1 10*3/uL (ref 0.0–0.5)
Eosinophils Relative: 2 %
HCT: 45.3 % (ref 39.0–52.0)
Hemoglobin: 14.7 g/dL (ref 13.0–17.0)
Immature Granulocytes: 6 %
Lymphocytes Relative: 27 %
Lymphs Abs: 1.8 10*3/uL (ref 0.7–4.0)
MCH: 24.7 pg — ABNORMAL LOW (ref 26.0–34.0)
MCHC: 32.5 g/dL (ref 30.0–36.0)
MCV: 76.1 fL — ABNORMAL LOW (ref 80.0–100.0)
Monocytes Absolute: 0.7 10*3/uL (ref 0.1–1.0)
Monocytes Relative: 11 %
Neutro Abs: 3.5 10*3/uL (ref 1.7–7.7)
Neutrophils Relative %: 53 %
Platelet Count: 165 10*3/uL (ref 150–400)
RBC: 5.95 MIL/uL — ABNORMAL HIGH (ref 4.22–5.81)
RDW: 15.1 % (ref 11.5–15.5)
Smear Review: NORMAL
WBC Count: 6.6 10*3/uL (ref 4.0–10.5)
nRBC: 0 % (ref 0.0–0.2)

## 2024-01-20 LAB — LACTATE DEHYDROGENASE: LDH: 173 U/L (ref 98–192)

## 2024-01-20 MED ORDER — SODIUM CHLORIDE 0.9 % IV SOLN
20.0000 mg | Freq: Once | INTRAVENOUS | Status: AC
  Administered 2024-01-20: 20 mg via INTRAVENOUS
  Filled 2024-01-20: qty 20
  Filled 2024-01-20: qty 2

## 2024-01-20 MED ORDER — FAMOTIDINE IN NACL 20-0.9 MG/50ML-% IV SOLN
20.0000 mg | Freq: Once | INTRAVENOUS | Status: AC
  Administered 2024-01-20: 20 mg via INTRAVENOUS
  Filled 2024-01-20: qty 50

## 2024-01-20 MED ORDER — SODIUM CHLORIDE 0.9 % IV SOLN
1000.0000 mg | Freq: Once | INTRAVENOUS | Status: AC
  Administered 2024-01-20: 1000 mg via INTRAVENOUS
  Filled 2024-01-20: qty 40

## 2024-01-20 MED ORDER — ACETAMINOPHEN 325 MG PO TABS
650.0000 mg | ORAL_TABLET | Freq: Once | ORAL | Status: AC
  Administered 2024-01-20: 650 mg via ORAL
  Filled 2024-01-20: qty 2

## 2024-01-20 MED ORDER — SODIUM CHLORIDE 0.9 % IV SOLN: INTRAVENOUS | Status: DC

## 2024-01-20 MED ORDER — DIPHENHYDRAMINE HCL 50 MG/ML IJ SOLN
50.0000 mg | Freq: Once | INTRAMUSCULAR | Status: AC
Start: 2024-01-20 — End: 2024-01-20
  Administered 2024-01-20: 50 mg via INTRAVENOUS
  Filled 2024-01-20: qty 1

## 2024-01-20 NOTE — Patient Instructions (Signed)
 [facility] - A DEPT OF [facility]  Discharge Instructions: Thank you for choosing [facility] to provide your oncology and hematology care.   If you have a lab appointment with the Cancer Center, please go directly to the Cancer Center and check in at the registration area.   Wear comfortable clothing and clothing appropriate for easy access to any Portacath or PICC line.   We strive to give you quality time with your provider. You may need to reschedule your appointment if you arrive late (15 or more minutes).  Arriving late affects you and other patients whose appointments are after yours.  Also, if you miss three or more appointments without notifying the office, you may be dismissed from the clinic at the provider's discretion.      For prescription refill requests, have your pharmacy contact our office and allow 72 hours for refills to be completed.    Today you received the following chemotherapy and/or immunotherapy agents gazyva      To help prevent nausea and vomiting after your treatment, we encourage you to take your nausea medication as directed.  BELOW ARE SYMPTOMS THAT SHOULD BE REPORTED IMMEDIATELY: *FEVER GREATER THAN 100.4 F (38 C) OR HIGHER *CHILLS OR SWEATING *NAUSEA AND VOMITING THAT IS NOT CONTROLLED WITH YOUR NAUSEA MEDICATION *UNUSUAL SHORTNESS OF BREATH *UNUSUAL BRUISING OR BLEEDING *URINARY PROBLEMS (pain or burning when urinating, or frequent urination) *BOWEL PROBLEMS (unusual diarrhea, constipation, pain near the anus) TENDERNESS IN MOUTH AND THROAT WITH OR WITHOUT PRESENCE OF ULCERS (sore throat, sores in mouth, or a toothache) UNUSUAL RASH, SWELLING OR PAIN  UNUSUAL VAGINAL DISCHARGE OR ITCHING   Items with * indicate a potential emergency and should be followed up as soon as possible or go to the Emergency Department if any problems should occur.  Please show the CHEMOTHERAPY ALERT CARD or IMMUNOTHERAPY  ALERT CARD at check-in to the Emergency Department and triage nurse.  Should you have questions after your visit or need to cancel or reschedule your appointment, please contact [facility] - A DEPT OF [facility]  Dept: [phone]  and follow the prompts.  Office hours are 8:00 a.m. to 4:30 p.m. Monday - Friday. Please note that voicemails left after 4:00 p.m. may not be returned until the following business day.  We are closed weekends and major holidays. You have access to a nurse at all times for urgent questions. Please call the main number to the clinic Dept: [phone] and follow the prompts.   For any non-urgent questions, you may also contact your provider using MyChart. We now offer e-Visits for anyone 59 and older to request care online for non-urgent symptoms. For details visit [email or web address].   Also download the MyChart app! Go to the app store, search "MyChart", open the app, select [facility], and log in with your MyChart username and password.  Obinutuzumab Injection What is this medication? OBINUTUZUMAB (OH bi nue TOOZ ue mab) treats leukemia and lymphoma. It works by blocking a protein that causes cancer cells to grow and multiply. This helps to slow or stop the spread of cancer cells. It is a monoclonal antibody. This medicine may be used for other purposes; ask your health care provider or pharmacist if you have questions. COMMON BRAND NAME(S): GAZYVA What should I tell my care team before I take this medication? They need to know if you have any of these  conditions: Heart disease Infection, especially a viral infection, such as hepatitis B Lung or breathing disease Take medications that treat or prevent blood clots An unusual or allergic reaction to obinutuzumab, other medications, foods, dyes, or preservatives Pregnant or trying to get pregnant Breastfeeding How should I use this medication? This medication is for infusion  into a vein. It is given by a care team in a hospital or clinic setting. Talk to your care team about the use of this medication in children. Special care may be needed. Overdosage: If you think you have taken too much of this medicine contact a poison control center or emergency room at once. NOTE: This medicine is only for you. Do not share this medicine with others. What if I miss a dose? Keep appointments for follow-up doses as directed. It is important not to miss your dose. Call your care team if you are unable to keep an appointment. What may interact with this medication? Live virus vaccines This list may not describe all possible interactions. Give your health care provider a list of all the medicines, herbs, non-prescription drugs, or dietary supplements you use. Also tell them if you smoke, drink alcohol, or use illegal drugs. Some items may interact with your medicine. What should I watch for while using this medication? Report any side effects that you notice during your treatment right away, such as changes in your breathing, fever, chills, dizziness or lightheadedness. These effects are more common with the first dose. Visit your care team for checks on your progress. You will need to have regular blood work. Report any other side effects. The side effects of this medication can continue after you finish your treatment. Continue your course of treatment even though you feel ill unless your care team tells you to stop. Call your care team for advice if you get a fever, chills or sore throat, or other symptoms of a cold or flu. Do not treat yourself. This medication decreases your body's ability to fight infections. Try to avoid being around people who are sick. This medication may increase your risk to bruise or bleed. Call your care team if you notice any unusual bleeding. Do not become pregnant while taking this medication or for 6 months after stopping it. Inform your care team if you  wish to become pregnant or think you might be pregnant. There is a potential for serious side effects to an unborn child. Talk to your care team or pharmacist for more information. Do not breast-feed an infant while taking this medication or for 6 months after stopping it. What side effects may I notice from receiving this medication? Side effects that you should report to your care team as soon as possible: Allergic reactions--skin rash, itching, hives, swelling of the face, lips, tongue, or throat Bleeding--bloody or black, tar-like stools, vomiting blood or brown material that looks like coffee grounds, red or dark brown urine, small red or purple spots on skin, unusual bruising or bleeding Blood clot--pain, swelling, or warmth in the leg, shortness of breath, chest pain Dizziness, loss of balance or coordination, confusion or trouble speaking Infection--fever, chills, cough, sore throat, wounds that don't heal, pain or trouble when passing urine, general feeling of discomfort or being unwell Infusion reactions--chest pain, shortness of breath or trouble breathing, feeling faint or lightheaded Liver injury--right upper belly pain, loss of appetite, nausea, light-colored stool, dark yellow or brown urine, yellowing skin or eyes, unusual weakness or fatigue Tumor lysis syndrome (TLS)--nausea, vomiting, diarrhea, decrease  in the amount of urine, dark urine, unusual weakness or fatigue, confusion, muscle pain or cramps, fast or irregular heartbeat, joint pain Side effects that usually do not require medical attention (report to your care team if they continue or are bothersome): Bone, joint, or muscle pain Constipation Diarrhea Fatigue Runny or stuffy nose Sore throat This list may not describe all possible side effects. Call your doctor for medical advice about side effects. You may report side effects to FDA at 1-800-FDA-1088. Where should I keep my medication? This medication is only given in a  hospital or clinic and will not be stored at home. NOTE: This sheet is a summary. It may not cover all possible information. If you have questions about this medicine, talk to your doctor, pharmacist, or health care provider.  2024 Elsevier/Gold Standard (2022-02-21 00:00:00)

## 2024-01-23 ENCOUNTER — Telehealth (HOSPITAL_BASED_OUTPATIENT_CLINIC_OR_DEPARTMENT_OTHER): Payer: Self-pay

## 2024-01-23 NOTE — Telephone Encounter (Signed)
 Patient has not been seen since 07/2022. Spoke with [facility] and told them pt has an appt April 23,2025 we will renew supplies then  Copied from CRM [id number]. Topic: Clinical - Prescription Issue >> Jan 22, 2024  4:15 PM Konrad Dolores wrote: Reason for CRM: Gerilyn Pilgrim from [facility] called in requesting a confirmation and coordination with the fax sent this week regarding patient James Parrish requesting CPAP machine supplies. Fax number [phone] and phone number [phone] requesting an order and confirmation from Dr. Vassie Loll regarding the CPAP machine supplies.

## 2024-01-24 MED FILL — Dexamethasone Sodium Phosphate Inj 100 MG/10ML: INTRAMUSCULAR | Qty: 2 | Status: AC

## 2024-01-27 ENCOUNTER — Other Ambulatory Visit (HOSPITAL_COMMUNITY): Payer: Self-pay

## 2024-01-27 ENCOUNTER — Inpatient Hospital Stay: Admitting: Hematology and Oncology

## 2024-01-27 ENCOUNTER — Inpatient Hospital Stay

## 2024-01-27 ENCOUNTER — Telehealth: Payer: Self-pay

## 2024-01-27 ENCOUNTER — Telehealth: Payer: Self-pay | Admitting: Pharmacy Technician

## 2024-01-27 ENCOUNTER — Other Ambulatory Visit: Payer: Self-pay | Admitting: *Deleted

## 2024-01-27 VITALS — BP 130/85 | HR 82 | Temp 98.4°F | Resp 16

## 2024-01-27 VITALS — BP 147/95 | HR 93 | Temp 97.6°F | Resp 15 | Wt 263.8 lb

## 2024-01-27 DIAGNOSIS — C9110 Chronic lymphocytic leukemia of B-cell type not having achieved remission: Secondary | ICD-10-CM

## 2024-01-27 DIAGNOSIS — D72820 Lymphocytosis (symptomatic): Secondary | ICD-10-CM

## 2024-01-27 DIAGNOSIS — Z5112 Encounter for antineoplastic immunotherapy: Secondary | ICD-10-CM | POA: Diagnosis not present

## 2024-01-27 LAB — CBC WITH DIFFERENTIAL (CANCER CENTER ONLY)
Abs Immature Granulocytes: 0.06 K/uL (ref 0.00–0.07)
Basophils Absolute: 0.1 K/uL (ref 0.0–0.1)
Basophils Relative: 1 %
Eosinophils Absolute: 0.1 K/uL (ref 0.0–0.5)
Eosinophils Relative: 1 %
HCT: 44.8 % (ref 39.0–52.0)
Hemoglobin: 14.4 g/dL (ref 13.0–17.0)
Immature Granulocytes: 1 %
Lymphocytes Relative: 28 %
Lymphs Abs: 1.4 K/uL (ref 0.7–4.0)
MCH: 24 pg — ABNORMAL LOW (ref 26.0–34.0)
MCHC: 32.1 g/dL (ref 30.0–36.0)
MCV: 74.8 fL — ABNORMAL LOW (ref 80.0–100.0)
Monocytes Absolute: 0.4 K/uL (ref 0.1–1.0)
Monocytes Relative: 8 %
Neutro Abs: 3 K/uL (ref 1.7–7.7)
Neutrophils Relative %: 61 %
Platelet Count: 195 K/uL (ref 150–400)
RBC: 5.99 MIL/uL — ABNORMAL HIGH (ref 4.22–5.81)
RDW: 15.3 % (ref 11.5–15.5)
WBC Count: 5 K/uL (ref 4.0–10.5)
nRBC: 0 % (ref 0.0–0.2)

## 2024-01-27 LAB — CMP (CANCER CENTER ONLY)
ALT: 13 U/L (ref 0–44)
AST: 12 U/L — ABNORMAL LOW (ref 15–41)
Albumin: 4.4 g/dL (ref 3.5–5.0)
Alkaline Phosphatase: 46 U/L (ref 38–126)
Anion gap: 9 (ref 5–15)
BUN: 17 mg/dL (ref 6–20)
CO2: 24 mmol/L (ref 22–32)
Calcium: 9.1 mg/dL (ref 8.9–10.3)
Chloride: 107 mmol/L (ref 98–111)
Creatinine: 0.97 mg/dL (ref 0.61–1.24)
GFR, Estimated: >60 mL/min (ref >60)
Glucose, Bld: 153 mg/dL — ABNORMAL HIGH (ref 70–99)
Potassium: 3.8 mmol/L (ref 3.5–5.1)
Sodium: 140 mmol/L (ref 135–145)
Total Bilirubin: 0.5 mg/dL (ref 0.0–1.2)
Total Protein: 6.6 g/dL (ref 6.5–8.1)

## 2024-01-27 LAB — URIC ACID: Uric Acid, Serum: 3.7 mg/dL (ref 3.7–8.6)

## 2024-01-27 MED ORDER — FAMOTIDINE IN NACL 20-0.9 MG/50ML-% IV SOLN
20.0000 mg | Freq: Once | INTRAVENOUS | Status: AC
  Administered 2024-01-27: 20 mg via INTRAVENOUS
  Filled 2024-01-27: qty 50

## 2024-01-27 MED ORDER — SODIUM CHLORIDE 0.9 % IV SOLN
20.0000 mg | Freq: Once | INTRAVENOUS | Status: AC
  Administered 2024-01-27: 20 mg via INTRAVENOUS
  Filled 2024-01-27: qty 20

## 2024-01-27 MED ORDER — SODIUM CHLORIDE 0.9 % IV SOLN
1000.0000 mg | Freq: Once | INTRAVENOUS | Status: AC
  Administered 2024-01-27: 1000 mg via INTRAVENOUS
  Filled 2024-01-27: qty 40

## 2024-01-27 MED ORDER — DIPHENHYDRAMINE HCL 50 MG/ML IJ SOLN
50.0000 mg | Freq: Once | INTRAMUSCULAR | Status: AC
  Administered 2024-01-27: 50 mg via INTRAVENOUS
  Filled 2024-01-27: qty 1

## 2024-01-27 MED ORDER — SODIUM CHLORIDE 0.9 % IV SOLN: INTRAVENOUS | Status: DC

## 2024-01-27 MED ORDER — VENETOCLAX 10 & 50 & 100 MG PO TBPK
ORAL_TABLET | ORAL | 0 refills | Status: DC
  Filled 2024-02-05: qty 42, 28d supply, fill #0

## 2024-01-27 MED ORDER — ACETAMINOPHEN 325 MG PO TABS
650.0000 mg | ORAL_TABLET | Freq: Once | ORAL | Status: AC
  Administered 2024-01-27: 650 mg via ORAL
  Filled 2024-01-27: qty 2

## 2024-01-27 NOTE — Telephone Encounter (Signed)
 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  Labs from 01/27/24 (CBC and CMP) assessed, no interventions needed. Last uric acid drawn on 12/30/23. Prescription dose and frequency assessed for appropriateness.   Current medication list in Epic reviewed, DDIs with Venclexta identified: - metformin, farxiga, insulin, and semaglutide: venetoclax is a hyperglycemia associated agent and may decrease the efficacy of these antidiabetic agents. Patients glucose will need to be monitored closely while on venetoclax.   Evaluated chart and no patient barriers to medication adherence noted.   Prescription has been e-scribed to the [facility] for benefits analysis and approval.  Oral Oncology Clinic will continue to follow for insurance authorization, copayment issues, initial counseling and start date.  Ritik Stavola, PharmD Hematology/Oncology Clinical Pharmacist [facility] Oral Chemotherapy Navigation Clinic [phone] 01/27/2024 10:43 AM

## 2024-01-27 NOTE — Telephone Encounter (Signed)
 Oral Oncology Patient Advocate Encounter  Prior Authorization for Venclexta has been approved.    PA# PA Case ID #: [id number] Effective dates: 01/27/24 through 01/26/25  Patients co-pay is $964.94.    Roda Cirri, CPhT Specialty Pharmacy Patient Advocate Phone: [phone] Fax: [phone]

## 2024-01-27 NOTE — Telephone Encounter (Signed)
 Oral Oncology Patient Advocate Encounter   Received notification that prior authorization for Venclexta Starter Pack is required.   PA submitted on 01/27/24 Key [id number] Status is pending     Roda Cirri, CPhT Specialty Pharmacy Patient Advocate Phone: [phone] Fax: [phone]

## 2024-01-27 NOTE — Progress Notes (Unsigned)
 [facility] [phone]   [phone]  PROGRESS NOTE  Patient Care Team: Etta Grandchild, MD as PCP - General (Internal Medicine) Quintella Reichert, MD as PCP - Sleep Medicine (Cardiology)  Hematological/Oncological History # CLL, Rai Stage 0. Del 11 01/25/2020: WBC 9.6, Hgb 15.6, MCV 79.1, Plt 241 05/29/2021: WBC 13.3, Hgb 15.4, MCV 76.9, Plt 203 11/16/2021: patient had routine CBC collected, showed WBC 15.9, Hgb 14.8, MCV 76.4, ALC 11.6 12/14/2021: establish care with Dr. Leonides Schanz  05/24/2023: WBC 43.2, Hgb 14.6, MCV 77.8, Plt 217 11/25/2023: WBC 95.9, Hgb 15.4, MCV 77.8, Plt 221   Interval History:  James Parrish 58 y.o. male with medical history significant for CLL who presents for a follow up visit. The patient's last visit was on 11/24/2022. In the interim, he denies any changes to his health.   On exam today James Parrish reports reports with treatment 1 he was having some issues with discomfort in his lower back.  He notes that Pepcid worked well he did have 2 attacks like this.  He reports he had no trouble with the second infusion.  He reports since then his energy levels have been so-so.  He notes that he has been able to do yard work but is taking frequent naps.  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  He is not feeling drowsy on the job.  He reports his appetite has been strong and he is "started putting on weight".  He had no infectious symptoms such as runny nose, sore throat, cough.  He denies any fevers, chills, sweats.  Overall he is willing and able to proceed with treatment this time.  A full 10 point ROS is otherwise negative.  Today we discussed the neck step in treatment which is adding in the venetoclax.  We recommend starting as scheduled for the start of cycle #2.  He voices understanding of the lab requirements and the risks of starting an aquatic therapy and was willing and able to proceed.  MEDICAL HISTORY:  Past  Medical History:  Diagnosis Date   CLL (chronic lymphocytic leukemia) (HCC)    Complication of anesthesia    hard to wake up    Diabetes mellitus without complication [facility])    ED (erectile dysfunction)    History of COVID-19 04/2019   Hyperlipidemia    Hypertension    Sleep apnea    uses CPAP nightly    SURGICAL HISTORY: Past Surgical History:  Procedure Laterality Date   BRONCHIAL NEEDLE ASPIRATION BIOPSY  05/29/2021   Procedure: BRONCHIAL NEEDLE ASPIRATION BIOPSIES;  Surgeon: Josephine Igo, DO;  Location: [facility];  Service: Pulmonary;;   COLONOSCOPY     finger surgery Right    Index   KNEE SURGERY Left    KNEE SURGERY Right    ORIF PATELLA Right    REVERSE SHOULDER ARTHROPLASTY Right 08/31/2020   Procedure: REVERSE SHOULDER ARTHROPLASTY;  Surgeon: Bjorn Pippin, MD;  Location: [facility];  Service: Orthopedics;  Laterality: Right;   SHOULDER ARTHROSCOPY WITH ROTATOR CUFF REPAIR Left 02/04/2020   Procedure: SHOULDER ARTHROSCOPY WITH ROTATOR CUFF REPAIR WITH BICEP TENODESIS;  Surgeon: Bjorn Pippin, MD;  Location: [facility];  Service: Orthopedics;  Laterality: Left;   VIDEO BRONCHOSCOPY WITH ENDOBRONCHIAL ULTRASOUND Left 05/29/2021   Procedure: VIDEO BRONCHOSCOPY WITH ENDOBRONCHIAL ULTRASOUND;  Surgeon: Josephine Igo, DO;  Location: [facility];  Service: Pulmonary;  Laterality: Left;  SOCIAL HISTORY: Social History   Socioeconomic History   Marital status: Married    Spouse name: Not on file   Number of children: Not on file   Years of education: Not on file   Highest education level: 12th grade  Occupational History   Not on file  Tobacco Use   Smoking status: Former    Current packs/day: 0.00    Average packs/day: 1.5 packs/day for 25.0 years (37.5 ttl pk-yrs)    Types: Cigarettes    Start date: 04/1995    Quit date: 04/2020    Years since quitting: 3.7    Passive exposure: Past   Smokeless tobacco: Never  Vaping Use    Vaping status: Never Used  Substance and Sexual Activity   Alcohol use: Not Currently    Comment: occasionally   Drug use: Never   Sexual activity: Yes    Partners: Female  Other Topics Concern   Not on file  Social History Narrative   Not on file   Social Drivers of Health   Financial Resource Strain: High Risk (12/28/2023)   Overall Financial Resource Strain (CARDIA)    Difficulty of Paying Living Expenses: Very hard  Food Insecurity: Food Insecurity Present (12/28/2023)   Hunger Vital Sign    Worried About Running Out of Food in the Last Year: Often true    Ran Out of Food in the Last Year: Often true  Transportation Needs: No Transportation Needs (12/28/2023)   PRAPARE - Administrator, Civil Service (Medical): No    Lack of Transportation (Non-Medical): No  Physical Activity: Insufficiently Active (12/28/2023)   Exercise Vital Sign    Days of Exercise per Week: 3 days    Minutes of Exercise per Session: 10 min  Stress: Stress Concern Present (12/28/2023)   [employer] of Occupational Health - Occupational Stress Questionnaire    Feeling of Stress : To some extent  Social Connections: Socially Isolated (12/28/2023)   Social Connection and Isolation Panel [NHANES]    Frequency of Communication with Friends and Family: Twice a week    Frequency of Social Gatherings with Friends and Family: Never    Attends Religious Services: Never    Diplomatic Services operational officer: No    Attends Engineer, structural: Not on file    Marital Status: Married  Catering manager Violence: Not on file    FAMILY HISTORY: Family History  Problem Relation Age of Onset   Cancer Mother        breast cancer    Hypertension Mother    Diabetes Mother    Venous thrombosis Son    Colon cancer Neg Hx    Esophageal cancer Neg Hx    Stomach cancer Neg Hx    Rectal cancer Neg Hx     ALLERGIES:  is allergic to gazyva [obinutuzumab].  MEDICATIONS:  Current  Outpatient Medications  Medication Sig Dispense Refill   venetoclax (VENCLEXTA) 10 & 50 & 100 MG Starter Pack Take by mouth daily. Take 20 mg for 7 days, then 50 mg daily x 7d, then 100 mg daily x 7d, then 200 mg daily x 7d. Take with food & water. 42 tablet 0   allopurinol (ZYLOPRIM) 300 MG tablet Take 1 tablet (300 mg total) by mouth daily. 30 tablet 3   amLODipine (NORVASC) 10 MG tablet TAKE 1 TABLET BY MOUTH AT BEDTIME 90 tablet 0   atorvastatin (LIPITOR) 10 MG tablet TAKE 1 TABLET BY MOUTH DAILY  30 tablet 3   dapagliflozin propanediol (FARXIGA) 10 MG TABS tablet Take 1 tablet (10 mg total) by mouth daily before breakfast. 90 tablet 3   diclofenac (VOLTAREN) 75 MG EC tablet Take 75 mg by mouth 2 (two) times daily.     glucose blood test strip Use as instructed to test blood sugar 2 times daily E11.65    Onetouch strips 100 each 12   indapamide (LOZOL) 1.25 MG tablet TAKE 1 TABLET BY MOUTH DAILY 90 tablet 0   insulin glargine (LANTUS SOLOSTAR) 100 UNIT/ML Solostar Pen Inject 30 Units into the skin daily. 30 mL 6   Insulin Pen Needle 29G X MISC 1 Device by Does not apply route daily in the afternoon. 100 each 3   losartan (COZAAR) 100 MG tablet TAKE 1 TABLET BY MOUTH DAILY 90 tablet 0   metFORMIN (GLUCOPHAGE) 1000 MG tablet Take 1 tablet (1,000 mg total) by mouth 2 (two) times daily with a meal. 180 tablet 3   Multiple Vitamin (MULTIVITAMIN WITH MINERALS) TABS tablet Take 1 tablet by mouth daily.     nebivolol (BYSTOLIC) 10 MG tablet TAKE 1 TABLET BY MOUTH DAILY 90 tablet 0   ondansetron (ZOFRAN) 8 MG tablet Take 1 tablet (8 mg total) by mouth every 8 (eight) hours as needed for nausea or vomiting. 20 tablet 2   oseltamivir (TAMIFLU) 75 MG capsule Take 1 capsule (75 mg total) by mouth 2 (two) times daily. 10 capsule 0   prochlorperazine (COMPAZINE) 10 MG tablet Take 1 tablet (10 mg total) by mouth every 6 (six) hours as needed for nausea or vomiting. 30 tablet 2    promethazine-dextromethorphan (PROMETHAZINE-DM) 6.25-15 MG/5ML syrup Take 5 mLs by mouth 3 (three) times daily as needed for cough. 200 mL 0   Semaglutide, 2 MG/DOSE, (OZEMPIC, 2 MG/DOSE,) 8 MG/3ML SOPN DIAL AND INJECT UNDER THE SKIN 2 MG WEEKLY 3 mL 1   spironolactone (ALDACTONE) 50 MG tablet TAKE 1 TABLET BY MOUTH DAILY 30 tablet 0   tadalafil (CIALIS) 20 MG tablet TAKE 1 TABLET BY MOUTH DAILY AS NEEDED FOR ERECTILE DYSFUNCTION 10 tablet 2   No current facility-administered medications for this visit.    REVIEW OF SYSTEMS:   Constitutional: ( - ) fevers, ( - )  chills , ( - ) night sweats Eyes: ( - ) blurriness of vision, ( - ) double vision, ( - ) watery eyes Ears, nose, mouth, throat, and face: ( - ) mucositis, ( - ) sore throat Respiratory: ( - ) cough, ( - ) dyspnea, ( - ) wheezes Cardiovascular: ( - ) palpitation, ( - ) chest discomfort, ( - ) lower extremity swelling Gastrointestinal:  ( - ) nausea, ( - ) heartburn, ( - ) change in bowel habits Skin: ( - ) abnormal skin rashes Lymphatics: ( - ) new lymphadenopathy, ( - ) easy bruising Neurological: ( - ) numbness, ( - ) tingling, ( - ) new weaknesses Behavioral/Psych: ( - ) mood change, ( - ) new changes  All other systems were reviewed with the patient and are negative.  PHYSICAL EXAMINATION: ECOG PERFORMANCE STATUS: 0 - Asymptomatic  Vitals:   01/27/24 0903  BP: (!) 147/95  Pulse: 93  Resp: 15  Temp: 97.6 F (36.4 C)  SpO2: 96%      Filed Weights   01/27/24 0903  Weight: 263 lb 12.8 oz (119.7 kg)       GENERAL: Well-appearing middle-aged African-American male alert, no distress and comfortable  SKIN: skin color, texture, turgor are normal, no rashes or significant lesions EYES: conjunctiva are pink and non-injected, sclera clear NECK: supple, non-tender LYMPH: Mildly prominent left-sided cervical lymph nodes, no other overt lymphadenopathy noted on exam LUNGS: clear to auscultation and percussion with normal  breathing effort HEART: regular rate & rhythm and no murmurs and no lower extremity edema Musculoskeletal: no cyanosis of digits and no clubbing  PSYCH: alert & oriented x 3, fluent speech NEURO: no focal motor/sensory deficits  LABORATORY DATA:  I have reviewed the data as listed    Latest Ref Rng & Units 01/27/2024    8:19 AM 01/20/2024   10:03 AM 01/13/2024    8:16 AM  CBC  WBC 4.0 - 10.5 K/uL 5.0  6.6  81.8   Hemoglobin 13.0 - 17.0 g/dL 81.1  91.4  78.2   Hematocrit 39.0 - 52.0 % 44.8  45.3  43.8   Platelets 150 - 400 K/uL 195  165  201        Latest Ref Rng & Units 01/27/2024    8:19 AM 01/20/2024   10:03 AM 01/13/2024    8:16 AM  CMP  Glucose 70 - 99 mg/dL 956  213  086   BUN 6 - 20 mg/dL 17  18  18    Creatinine 0.61 - 1.24 mg/dL 5.78  4.69  6.29   Sodium 135 - 145 mmol/L 140  139  141   Potassium 3.5 - 5.1 mmol/L 3.8  3.9  3.9   Chloride 98 - 111 mmol/L 107  108  110   CO2 22 - 32 mmol/L 24  24  22    Calcium 8.9 - 10.3 mg/dL 9.1  9.2  9.1   Total Protein 6.5 - 8.1 g/dL 6.6  6.9  6.4   Total Bilirubin 0.0 - 1.2 mg/dL 0.5  0.3  0.3   Alkaline Phos 38 - 126 U/L 46  55  50   AST 15 - 41 U/L 12  18  12    ALT 0 - 44 U/L 13  18  14      RADIOGRAPHIC STUDIES:  No results found.  ASSESSMENT & PLAN James Parrish 58 y.o. male with medical history significant for CLL who presents for a follow up visit.  # CLL, Rai Stage 0 --Due to rapidly rising white blood cell count patient was started on obinutuzumab/venetoclax.  Cycle 1 day 1 was on 01/13/2024.  --Patient has started therapy and is tolerating it well. --Labs show white blood cell count 5.0, hemoglobin 14.4, MCV 74.8, platelets 195. -- Venetoclax to be added with cycle 2. Will need labs on Days 1,2, 8, and 9, then weekly thereafter.  --RTC for Cycle 2 Day 1 with the start of Venetoclax on 02/10/2024.   Orders Placed This Encounter  Procedures   CBC with Differential (Cancer Center Only)    Standing Status:   Future     Expected Date:   03/09/2024    Expiration Date:   03/09/2025   CMP (Cancer Center only)    Standing Status:   Future    Expected Date:   03/09/2024    Expiration Date:   03/09/2025    All questions were answered. The patient knows to call the clinic with any problems, questions or concerns.  I have spent a total of 30 minutes minutes of face-to-face and non-face-to-face time, preparing to see the patient,  performing a medically appropriate examination, counseling and educating the patient, documenting clinical information in  the electronic health record, and care coordination.   Rogerio Clay, MD Department of Hematology/Oncology [facility] Phone: [phone] Pager: [phone] Email: [email or web address]   01/28/2024 9:15 AM  Armando Lance BD, Catovsky D, Caligaris-Cappio F, Dighiero G, Dhner H, Hillmen P, Keating M, Montserrat E, Chiorazzi N, Stilgenbauer S, Rai KR, Adams, Eichhorst B, O'Brien S, Robak T, Seymour JF, [place]ello ON. iwCLL guidelines for diagnosis, indications for treatment, response assessment, and supportive management of CLL. Blood. 2018 Jun 21;131(25):2745-2760.  Active disease should be clearly documented to initiate therapy. At least 1 of the following criteria should be met.  1) Evidence of progressive marrow failure as manifested by the development of, or worsening of, anemia and/or thrombocytopenia. Cutoff levels of Hb <10 g/dL or platelet counts <629  109/L are generally regarded as indication for treatment. However, in some patients, platelet counts <100  109/L may remain stable over a long period; this situation does not automatically require therapeutic intervention. 2) Massive (ie, >=6 cm below the left costal margin) or progressive or symptomatic splenomegaly. 3) Massive nodes (ie, >=10 cm in longest diameter) or progressive or symptomatic lymphadenopathy. 4) Progressive lymphocytosis with an increase of >=50%  over a 31-month period, or lymphocyte doubling time (LDT) <6 months. LDT can be obtained by linear regression extrapolation of absolute lymphocyte counts obtained at intervals of 2 weeks over an observation period of 2 to 3 months; patients with initial blood lymphocyte counts <30  109/L may require a longer observation period to determine the LDT. Factors contributing to lymphocytosis other than CLL (eg, infections, steroid administration) should be excluded. 5) Autoimmune complications including anemia or thrombocytopenia poorly responsive to corticosteroids. 6) Symptomatic or functional extranodal involvement (eg, skin, kidney, lung, spine). Disease-related symptoms as defined by any of the following: Unintentional weight loss >=10% within the previous 6 months. Significant fatigue (ie, ECOG performance scale 2 or worse; cannot work or unable to perform usual activities). Fevers >=100.87F or 38.0C for 2 or more weeks without evidence of infection. Night sweats for >=1 month without evidence of infection.

## 2024-01-27 NOTE — Patient Instructions (Signed)
 [facility] - A DEPT OF [facility]  Discharge Instructions: Thank you for choosing [facility] to provide your oncology and hematology care.   If you have a lab appointment with the Cancer Center, please go directly to the Cancer Center and check in at the registration area.   Wear comfortable clothing and clothing appropriate for easy access to any Portacath or PICC line.   We strive to give you quality time with your provider. You may need to reschedule your appointment if you arrive late (15 or more minutes).  Arriving late affects you and other patients whose appointments are after yours.  Also, if you miss three or more appointments without notifying the office, you may be dismissed from the clinic at the provider's discretion.      For prescription refill requests, have your pharmacy contact our office and allow 72 hours for refills to be completed.    Today you received the following chemotherapy and/or immunotherapy agents: Gazyva.      To help prevent nausea and vomiting after your treatment, we encourage you to take your nausea medication as directed.  BELOW ARE SYMPTOMS THAT SHOULD BE REPORTED IMMEDIATELY: *FEVER GREATER THAN 100.4 F (38 C) OR HIGHER *CHILLS OR SWEATING *NAUSEA AND VOMITING THAT IS NOT CONTROLLED WITH YOUR NAUSEA MEDICATION *UNUSUAL SHORTNESS OF BREATH *UNUSUAL BRUISING OR BLEEDING *URINARY PROBLEMS (pain or burning when urinating, or frequent urination) *BOWEL PROBLEMS (unusual diarrhea, constipation, pain near the anus) TENDERNESS IN MOUTH AND THROAT WITH OR WITHOUT PRESENCE OF ULCERS (sore throat, sores in mouth, or a toothache) UNUSUAL RASH, SWELLING OR PAIN  UNUSUAL VAGINAL DISCHARGE OR ITCHING   Items with * indicate a potential emergency and should be followed up as soon as possible or go to the Emergency Department if any problems should occur.  Please show the CHEMOTHERAPY ALERT CARD or IMMUNOTHERAPY  ALERT CARD at check-in to the Emergency Department and triage nurse.  Should you have questions after your visit or need to cancel or reschedule your appointment, please contact [facility] - A DEPT OF [facility]  Dept: [phone]  and follow the prompts.  Office hours are 8:00 a.m. to 4:30 p.m. Monday - Friday. Please note that voicemails left after 4:00 p.m. may not be returned until the following business day.  We are closed weekends and major holidays. You have access to a nurse at all times for urgent questions. Please call the main number to the clinic Dept: [phone] and follow the prompts.   For any non-urgent questions, you may also contact your provider using MyChart. We now offer e-Visits for anyone 47 and older to request care online for non-urgent symptoms. For details visit [email or web address].   Also download the MyChart app! Go to the app store, search "MyChart", open the app, select [place], and log in with your MyChart username and password.  Obinutuzumab Injection What is this medication? OBINUTUZUMAB (OH bi nue TOOZ ue mab) treats leukemia and lymphoma. It works by blocking a protein that causes cancer cells to grow and multiply. This helps to slow or stop the spread of cancer cells. It is a monoclonal antibody. This medicine may be used for other purposes; ask your health care provider or pharmacist if you have questions. COMMON BRAND NAME(S): GAZYVA What should I tell my care team before I take this medication? They need to know if you have any of these  conditions: Heart disease Infection, especially a viral infection, such as hepatitis B Lung or breathing disease Take medications that treat or prevent blood clots An unusual or allergic reaction to obinutuzumab, other medications, foods, dyes, or preservatives Pregnant or trying to get pregnant Breastfeeding How should I use this medication? This medication is for infusion  into a vein. It is given by a care team in a hospital or clinic setting. Talk to your care team about the use of this medication in children. Special care may be needed. Overdosage: If you think you have taken too much of this medicine contact a poison control center or emergency room at once. NOTE: This medicine is only for you. Do not share this medicine with others. What if I miss a dose? Keep appointments for follow-up doses as directed. It is important not to miss your dose. Call your care team if you are unable to keep an appointment. What may interact with this medication? Live virus vaccines This list may not describe all possible interactions. Give your health care provider a list of all the medicines, herbs, non-prescription drugs, or dietary supplements you use. Also tell them if you smoke, drink alcohol, or use illegal drugs. Some items may interact with your medicine. What should I watch for while using this medication? Report any side effects that you notice during your treatment right away, such as changes in your breathing, fever, chills, dizziness or lightheadedness. These effects are more common with the first dose. Visit your care team for checks on your progress. You will need to have regular blood work. Report any other side effects. The side effects of this medication can continue after you finish your treatment. Continue your course of treatment even though you feel ill unless your care team tells you to stop. Call your care team for advice if you get a fever, chills or sore throat, or other symptoms of a cold or flu. Do not treat yourself. This medication decreases your body's ability to fight infections. Try to avoid being around people who are sick. This medication may increase your risk to bruise or bleed. Call your care team if you notice any unusual bleeding. Do not become pregnant while taking this medication or for 6 months after stopping it. Inform your care team if you  wish to become pregnant or think you might be pregnant. There is a potential for serious side effects to an unborn child. Talk to your care team or pharmacist for more information. Do not breast-feed an infant while taking this medication or for 6 months after stopping it. What side effects may I notice from receiving this medication? Side effects that you should report to your care team as soon as possible: Allergic reactions--skin rash, itching, hives, swelling of the face, lips, tongue, or throat Bleeding--bloody or black, tar-like stools, vomiting blood or brown material that looks like coffee grounds, red or dark brown urine, small red or purple spots on skin, unusual bruising or bleeding Blood clot--pain, swelling, or warmth in the leg, shortness of breath, chest pain Dizziness, loss of balance or coordination, confusion or trouble speaking Infection--fever, chills, cough, sore throat, wounds that don't heal, pain or trouble when passing urine, general feeling of discomfort or being unwell Infusion reactions--chest pain, shortness of breath or trouble breathing, feeling faint or lightheaded Liver injury--right upper belly pain, loss of appetite, nausea, light-colored stool, dark yellow or brown urine, yellowing skin or eyes, unusual weakness or fatigue Tumor lysis syndrome (TLS)--nausea, vomiting, diarrhea, decrease  in the amount of urine, dark urine, unusual weakness or fatigue, confusion, muscle pain or cramps, fast or irregular heartbeat, joint pain Side effects that usually do not require medical attention (report to your care team if they continue or are bothersome): Bone, joint, or muscle pain Constipation Diarrhea Fatigue Runny or stuffy nose Sore throat This list may not describe all possible side effects. Call your doctor for medical advice about side effects. You may report side effects to FDA at 1-800-FDA-1088. Where should I keep my medication? This medication is only given in a  hospital or clinic and will not be stored at home. NOTE: This sheet is a summary. It may not cover all possible information. If you have questions about this medicine, talk to your doctor, pharmacist, or health care provider.  2024 Elsevier/Gold Standard (2022-02-21 00:00:00)

## 2024-01-28 ENCOUNTER — Other Ambulatory Visit (HOSPITAL_COMMUNITY): Payer: Self-pay

## 2024-01-28 ENCOUNTER — Telehealth: Payer: Self-pay

## 2024-01-28 ENCOUNTER — Encounter: Payer: Self-pay | Admitting: Hematology and Oncology

## 2024-01-28 ENCOUNTER — Other Ambulatory Visit: Payer: Self-pay

## 2024-01-28 NOTE — Telephone Encounter (Addendum)
 Oral Oncology Patient Advocate Encounter   Was successful in obtaining a copay card for Venclexta.  This copay card will make the patients copay ~$0.00.   The billing information is as follows and has been shared with [facility].   RxBin: [id number]  PCN: [id number] Member ID: [id number] Group ID: [id number]   Hansel Ley, CPhT Pharmacy Technician Coordinator [facility] [phone] (Ph) 01/28/2024 8:34 AM

## 2024-01-29 ENCOUNTER — Other Ambulatory Visit: Payer: Self-pay | Admitting: Internal Medicine

## 2024-01-29 DIAGNOSIS — I10 Essential (primary) hypertension: Secondary | ICD-10-CM

## 2024-01-29 LAB — HM DIABETES EYE EXAM

## 2024-01-30 ENCOUNTER — Other Ambulatory Visit (HOSPITAL_COMMUNITY): Payer: Self-pay

## 2024-01-30 ENCOUNTER — Encounter: Payer: Self-pay | Admitting: Hematology and Oncology

## 2024-02-03 ENCOUNTER — Telehealth: Payer: Self-pay | Admitting: Primary Care

## 2024-02-03 NOTE — Telephone Encounter (Signed)
 Copied from CRM [id number]. Topic: General - Other >> Feb 03, 2024  8:59 AM Hilton Lucky wrote: Reason for CRM: Patient needs prescription for mask, straps, filters, water chamber, etc for his CPAP. Patient has appointment 04/23 but is going to struggle significantly to make it to that appointment due to his work. Supplies company is [employer]. Please contact patient.

## 2024-02-03 NOTE — Telephone Encounter (Signed)
Pt notified at [facility]

## 2024-02-05 ENCOUNTER — Telehealth: Payer: Self-pay | Admitting: Primary Care

## 2024-02-05 ENCOUNTER — Encounter: Payer: Self-pay | Admitting: Hematology and Oncology

## 2024-02-05 ENCOUNTER — Other Ambulatory Visit (HOSPITAL_COMMUNITY): Payer: Self-pay

## 2024-02-05 ENCOUNTER — Other Ambulatory Visit: Payer: Self-pay

## 2024-02-05 ENCOUNTER — Encounter: Payer: Self-pay | Admitting: Primary Care

## 2024-02-05 ENCOUNTER — Other Ambulatory Visit: Payer: Self-pay | Admitting: Internal Medicine

## 2024-02-05 ENCOUNTER — Ambulatory Visit: Admitting: Primary Care

## 2024-02-05 VITALS — BP 132/68 | HR 87 | Temp 97.9°F | Ht 74.0 in | Wt 259.8 lb

## 2024-02-05 DIAGNOSIS — G4733 Obstructive sleep apnea (adult) (pediatric): Secondary | ICD-10-CM | POA: Diagnosis not present

## 2024-02-05 DIAGNOSIS — J439 Emphysema, unspecified: Secondary | ICD-10-CM | POA: Diagnosis not present

## 2024-02-05 DIAGNOSIS — Z87891 Personal history of nicotine dependence: Secondary | ICD-10-CM | POA: Diagnosis not present

## 2024-02-05 DIAGNOSIS — E119 Type 2 diabetes mellitus without complications: Secondary | ICD-10-CM

## 2024-02-05 DIAGNOSIS — I10 Essential (primary) hypertension: Secondary | ICD-10-CM

## 2024-02-05 NOTE — Telephone Encounter (Signed)
 Scheduled LDCT 02/24/24 and confirmed with patient

## 2024-02-05 NOTE — Progress Notes (Unsigned)
 Specialty Pharmacy Initial Fill Coordination Note  James Parrish is a 58 y.o. male contacted today regarding initial fill of specialty medication(s) Venetoclax  (VENCLEXTA )  Patient requested [name] at [facility] at [place] date: 02/06/24  Medication will be filled on 02/05/24.   Patient is aware of $0.00 copayment. Bill co-pay card secondary.    James Parrish, CPhT Pharmacy Technician Coordinator [facility] [phone] (Ph) 02/05/2024 2:01 PM

## 2024-02-05 NOTE — Telephone Encounter (Signed)
 Due for LDCT in May 2025, needs scan to be schedule on Monday

## 2024-02-05 NOTE — Progress Notes (Signed)
 @Patient  ID: James Parrish, male    DOB: 05-29-1966, 58 y.o.   MRN: [id number]  Chief Complaint  Patient presents with   Follow-up    Wearing CPAP-no complaints    Referring provider: Arcadio Knuckles, MD  HPI: 58 year old male, former smoker.  Past medical history significant for hypertension, OSA, type 2 diabetes, CLL, former smoker.  02/05/2024 Discussed the use of AI scribe software for clinical note transcription with the patient, who gave verbal consent to proceed.  History of Present Illness   James Parrish is a 58 year old male with severe sleep apnea who presents for a follow-up visit.  He has a history of severe sleep apnea, diagnosed in 2021 with a home sleep study showing an average of 79 apneic events per hour. He underwent a titration study in a lab and was started on CPAP therapy, which he has been using consistently for the past three to four years. He experiences significant improvement in sleep quality, stating he 'can't sleep without it' and feels more refreshed upon waking. His CPAP usage is consistent, with a 94% adherence rate over the last 90 days, averaging six hours and twenty minutes per night. The CPAP is set to auto settings with a minimum pressure of 10 and a maximum of 18, effectively controlling his apneas to 0.4 events per hour.  He requires a prescription for CPAP supplies, which he receives from [facility], and uses a full face mask, specifically the F20 model. He is a former smoker and has a history of a pulmonary nodule. He continues to follow up with oncology for leukemia and experiences some fatigue and shortness of breath, which he attributes to his leukemia and chemotherapy rather. No significant impairment in daily activities due to breathing issues.  He has been working for six years and prefers medical appointments on Mondays to minimize disruption to his work schedule, as he does not get paid when he does not drive.     Airview download 11/07/2023  - 02/04/2024 Usage days 80 days (89%) greater than 4 hours Average usage days used 6 hours 20 minutes Pressure 10 to 18 cm H2O (13.3 cm H2O-95%) Air leak 6.9 L/min (95%) AHI 0.4  Allergies  Allergen Reactions   Gazyva  [Obinutuzumab ] Other (See Comments)    Pt with mild to moderate back pain beginning 2 hours after starting infusion. Resolved with emergency meds. See MAR on 01/13/24.    Immunization History  Administered Date(s) Administered   Influenza, Seasonal, Injecte, Preservative Fre 08/15/2012, 08/26/2013   Influenza,inj,Quad PF,6+ Mos 08/07/2017, 08/11/2020, 11/01/2022   Influenza,inj,quad, With Preservative 09/29/2014, 08/10/2015   PFIZER(Purple Top)SARS-COV-2 Vaccination 02/13/2020, 03/07/2020, 09/16/2020   PNEUMOCOCCAL CONJUGATE-20 12/30/2023   Tdap 10/16/2007, 11/24/2015   Zoster Recombinant(Shingrix ) 03/15/2022    Past Medical History:  Diagnosis Date   CLL (chronic lymphocytic leukemia) (HCC)    Complication of anesthesia    hard to wake up    Diabetes mellitus without complication (HCC)    ED (erectile dysfunction)    History of COVID-19 04/2019   Hyperlipidemia    Hypertension    Sleep apnea    uses CPAP nightly    Tobacco History: Social History   Tobacco Use  Smoking Status Former   Current packs/day: 0.00   Average packs/day: 1.5 packs/day for 25.0 years (37.5 ttl pk-yrs)   Types: Cigarettes   Start date: 04/1995   Quit date: 04/2020   Years since quitting: 3.8   Passive exposure: Past  Smokeless Tobacco  Never   Counseling given: Not Answered   Outpatient Medications Prior to Visit  Medication Sig Dispense Refill   allopurinol  (ZYLOPRIM ) 300 MG tablet Take 1 tablet (300 mg total) by mouth daily. 30 tablet 3   amLODipine  (NORVASC ) 10 MG tablet TAKE 1 TABLET BY MOUTH AT BEDTIME 90 tablet 0   atorvastatin  (LIPITOR) 10 MG tablet TAKE 1 TABLET BY MOUTH DAILY 30 tablet 3   dapagliflozin  propanediol (FARXIGA ) 10 MG TABS tablet Take 1 tablet (10 mg  total) by mouth daily before breakfast. 90 tablet 3   diclofenac (VOLTAREN) 75 MG EC tablet Take 75 mg by mouth 2 (two) times daily.     glucose blood test strip Use as instructed to test blood sugar 2 times daily E11.65    Onetouch strips 100 each 12   indapamide  (LOZOL ) 1.25 MG tablet TAKE 1 TABLET BY MOUTH DAILY 90 tablet 0   insulin  glargine (LANTUS  SOLOSTAR) 100 UNIT/ML Solostar Pen Inject 30 Units into the skin daily. 30 mL 6   Insulin  Pen Needle 29G X MISC 1 Device by Does not apply route daily in the afternoon. 100 each 3   losartan  (COZAAR ) 100 MG tablet TAKE 1 TABLET BY MOUTH DAILY 90 tablet 0   metFORMIN  (GLUCOPHAGE ) 1000 MG tablet Take 1 tablet (1,000 mg total) by mouth 2 (two) times daily with a meal. 180 tablet 3   Multiple Vitamin (MULTIVITAMIN WITH MINERALS) TABS tablet Take 1 tablet by mouth daily.     nebivolol  (BYSTOLIC ) 10 MG tablet TAKE 1 TABLET BY MOUTH DAILY 90 tablet 0   ondansetron  (ZOFRAN ) 8 MG tablet Take 1 tablet (8 mg total) by mouth every 8 (eight) hours as needed for nausea or vomiting. 20 tablet 2   prochlorperazine  (COMPAZINE ) 10 MG tablet Take 1 tablet (10 mg total) by mouth every 6 (six) hours as needed for nausea or vomiting. 30 tablet 2   Semaglutide , 2 MG/DOSE, (OZEMPIC , 2 MG/DOSE,) 8 MG/3ML SOPN DIAL AND INJECT UNDER THE SKIN 2 MG WEEKLY 3 mL 1   spironolactone  (ALDACTONE ) 50 MG tablet TAKE 1 TABLET BY MOUTH DAILY 30 tablet 0   tadalafil  (CIALIS ) 20 MG tablet TAKE 1 TABLET BY MOUTH DAILY AS NEEDED FOR ERECTILE DYSFUNCTION 10 tablet 2   oseltamivir  (TAMIFLU ) 75 MG capsule Take 1 capsule (75 mg total) by mouth 2 (two) times daily. (Patient not taking: Reported on 02/05/2024) 10 capsule 0   promethazine -dextromethorphan (PROMETHAZINE -DM) 6.25-15 MG/5ML syrup Take 5 mLs by mouth 3 (three) times daily as needed for cough. (Patient not taking: Reported on 02/05/2024) 200 mL 0   venetoclax  (VENCLEXTA ) 10 & 50 & 100 MG Starter Pack Take by mouth daily. Take 20 mg for  7 days, then 50 mg daily x 7d, then 100 mg daily x 7d, then 200 mg daily x 7d. Take with food & water. (Patient not taking: Reported on 02/05/2024) 42 tablet 0   No facility-administered medications prior to visit.   Review of Systems  Review of Systems  Constitutional: Negative.   Respiratory: Negative.    Cardiovascular: Negative.     Physical Exam  BP 132/68 (BP Location: Right Arm, Patient Position: Sitting, Cuff Size: Large)   Pulse 87   Temp 97.9 F (36.6 C) (Oral)   Ht 6\' 2"  (1.88 m)   Wt 259 lb 12.8 oz (117.8 kg)   SpO2 98%   BMI 33.36 kg/m  Physical Exam Constitutional:      Appearance: Normal appearance.  HENT:  Head: Normocephalic and atraumatic.  Cardiovascular:     Rate and Rhythm: Normal rate and regular rhythm.  Pulmonary:     Effort: Pulmonary effort is normal.     Breath sounds: Normal breath sounds.  Skin:    General: Skin is warm and dry.  Neurological:     General: No focal deficit present.     Mental Status: He is alert and oriented to person, place, and time. Mental status is at baseline.  Psychiatric:        Mood and Affect: Mood normal.        Behavior: Behavior normal.        Thought Content: Thought content normal.        Judgment: Judgment normal.      Lab Results:  CBC    Component Value Date/Time   WBC 5.0 01/27/2024 0819   WBC 31.4 Repeated and verified X2. (HH) 01/28/2023 1032   RBC 5.99 (H) 01/27/2024 0819   HGB 14.4 01/27/2024 0819   HCT 44.8 01/27/2024 0819   PLT 195 01/27/2024 0819   MCV 74.8 (L) 01/27/2024 0819   MCH 24.0 (L) 01/27/2024 0819   MCHC 32.1 01/27/2024 0819   RDW 15.3 01/27/2024 0819   LYMPHSABS 1.4 01/27/2024 0819   MONOABS 0.4 01/27/2024 0819   EOSABS 0.1 01/27/2024 0819   BASOSABS 0.1 01/27/2024 0819    BMET    Component Value Date/Time   NA 140 01/27/2024 0819   K 3.8 01/27/2024 0819   CL 107 01/27/2024 0819   CO2 24 01/27/2024 0819   GLUCOSE 153 (H) 01/27/2024 0819   BUN 17 01/27/2024  0819   CREATININE 0.97 01/27/2024 0819   CALCIUM  9.1 01/27/2024 0819   GFRNONAA >60 01/27/2024 0819   GFRAA >60 01/25/2020 1147    BNP No results found for: "BNP"  ProBNP    Component Value Date/Time   PROBNP 11.0 07/08/2023 0948    Imaging: No results found.   Assessment & Plan:   1. OSA (obstructive sleep apnea) (Primary) - Ambulatory Referral for DME  2. Former smoker  3. Pulmonary emphysema, unspecified emphysema type (HCC)   Assessment and Plan    Severe obstructive sleep apnea Severe obstructive sleep apnea well-controlled with CPAP therapy. Current pressure 10-18cm h20, reducing apneic events from 79 to 0.4 per hour. CPAP usage consistent and effective. - Renew prescription for CPAP supplies through [employer]. - Continue CPAP therapy with current settings nightly. - Schedule follow-up in one year.  Mild emphysema - Mild emphysema noted on CT imaging. Patient experiences mild fatigue and dyspnea likely related to leukemia and chemotherapy. Advised patient notify our office if breathing worsens or starts affecting daily activities and would recommend getting pulmonary function testing   Pulmonary nodule - Following with lung cancer screening program. LDCT in May 2024 showed stable small pulmonary nodules, largest measuring 2.6mm - Coordinate lung cancer screening for annual CT scan due May 2025, preferably on a Monday.  Leukemia - Continue to follow with oncology      Antonio Baumgarten, NP 02/05/2024

## 2024-02-05 NOTE — Patient Instructions (Addendum)
-  SEVERE OBSTRUCTIVE SLEEP APNEA: Severe obstructive sleep apnea is a condition where your airway becomes blocked during sleep, causing breathing pauses. Your CPAP therapy is effectively controlling your condition, reducing your apneic events to 0.4 per hour. Continue using your CPAP machine with the current settings, and we have renewed your prescription for CPAP supplies through [employer]. We will schedule a follow-up in one year.  -MILD EMPHYSEMA: Mild emphysema is a lung condition that causes shortness of breath. There has been no significant change in your pulmonary nodule size, and your fatigue and shortness of breath are likely related to your leukemia and chemotherapy.  -PULMONARY NODULE: A pulmonary nodule is a small growth in the lung. Your nodule remains stable. We will coordinate a lung cancer screening CT scan, preferably on a Monday.  -LEUKEMIA: Leukemia is a type of cancer that affects your blood and bone marrow. Your fatigue and shortness of breath are likely related to your leukemia and chemotherapy. If breathing worsens or is impacting your daily activities would recommend notifying our office and we can get a breathing test called pulmonary function testing  INSTRUCTIONS: We will schedule a lung cancer screening CT scan, preferably on a Monday. Continue your CPAP therapy with the current settings and follow up in one year. Please keep up with your oncology appointments for leukemia management.  Follow-up 1 year with Dr. Alva or Jerlene Moody Np

## 2024-02-05 NOTE — Telephone Encounter (Signed)
 Oral Chemotherapy Pharmacist Encounter  I spoke with patient for overview of: Venclexta  (venetoclax ) for the treatment of relapsed/refractory CLL in conjunction with rituximab.  Venclexta  dose will be titrated up per manufacturer recommendations and rituximab will be added to regimen. Ritximab will be given at 375 mg/m2 for 1st dose, then 500 mg/m2 for an additional 5 doses, given IV every 28 days. Daily dosing of Venclexta  will be continued for up to 2 years based on disease response and toleration of therapy.  Counseled patient on administration, dosing, side effects, monitoring, drug-food interactions, safe handling, storage, and disposal.  Patient will take Venclexta  10mg  tablets, 2 tablets (20 mg) by mouth once daily with food and water for 7 days. The 1st dose of Venclexta  20mg  will be administered *** based on tumor lysis syndrome (TLS) risk assessment to be *** risk. Baseline labs will be assessed and the 1st dose will be administered. CBC, uric acid, and electrolytes will be monitored at *** hours post dose administration per protocol. Hydration will be administered ***.  Venclexta  start date: ***  Patient was instructed to start allopurinol  ***mg every *** hours, starting 3 days prior to Venclexta  initiation. Allopurinol  will be continued until first week of Venclexta  plus rituximab combination therapy is completed. Patient was instructed to increase fluid intake to 1.5 - 2L of water per day starting 2 days prior to Venclexta  initiation.  Patient will take Venclexta  50mg  tablets, 1 tablet by mouth once daily with food and water for 7 days. The 1st dose of Venclexta  50mg  tablets will be administered *** based on TLS risk assessment. Baseline labs will be assessed and the 1st dose will be administered. CBC, uric acid, and electrolytes will be monitored at *** hours post dose administration per protocol. Hydration will be administered ***.  Subsequent ramp-up doses of 100mg  daily x  7 days, 200mg  daily x 7 days, and 400mg  daily (target maintenance dose) will be monitored per protocol.  Adverse effects include but are not limited to: TLS, decreased blood counts, electrolyte abnormalities, diarrhea, nausea, fatigue, arthralgias/myalgias, and upper respiratory tract infection.    Patient has anti-emetic on hand and knows to take it if nausea develops.   Patient will obtain anti diarrheal and alert the office of 4 or more loose stools above baseline.  Reviewed with patient importance of keeping a medication schedule and plan for any missed doses. No barriers to medication adherence identified.  Medication reconciliation performed and medication/allergy list updated.  Insurance authorization for Venclexta  has been obtained. Test claim at the pharmacy revealed copayment $*** for 1st fill of ***. This will ship from the [facility] on *** to deliver to patient's home on ***.  Patient informed the pharmacy will reach out 5-7 days prior to needing next fill of Venclexta  to coordinate continued medication acquisition to prevent break in therapy.  All questions answered.  *** voiced understanding and appreciation.   Medication education handout placed in mail for patient. Patient knows to call the office with questions or concerns. Oral Chemotherapy Clinic phone number provided to patient.   *** 02/05/2024   5:31 PM Oral Oncology Clinic [phone]

## 2024-02-06 NOTE — Progress Notes (Signed)
 Patient counseled on venetoclax  in telephone encounter opened on 01/27/2024.  Tayshawn Purnell, PharmD Hematology/Oncology Clinical Pharmacist [facility] [phone]

## 2024-02-07 ENCOUNTER — Other Ambulatory Visit (HOSPITAL_COMMUNITY): Payer: Self-pay

## 2024-02-07 MED FILL — Dexamethasone Sodium Phosphate Inj 100 MG/10ML: INTRAMUSCULAR | Qty: 2 | Status: AC

## 2024-02-09 ENCOUNTER — Other Ambulatory Visit: Payer: Self-pay | Admitting: Internal Medicine

## 2024-02-09 DIAGNOSIS — I10 Essential (primary) hypertension: Secondary | ICD-10-CM

## 2024-02-10 ENCOUNTER — Encounter: Payer: Self-pay | Admitting: Hematology and Oncology

## 2024-02-10 ENCOUNTER — Inpatient Hospital Stay

## 2024-02-10 ENCOUNTER — Other Ambulatory Visit: Payer: Self-pay | Admitting: Hematology and Oncology

## 2024-02-10 VITALS — BP 128/86 | HR 88 | Temp 97.9°F | Resp 16 | Wt 259.8 lb

## 2024-02-10 DIAGNOSIS — C9110 Chronic lymphocytic leukemia of B-cell type not having achieved remission: Secondary | ICD-10-CM

## 2024-02-10 DIAGNOSIS — Z5112 Encounter for antineoplastic immunotherapy: Secondary | ICD-10-CM | POA: Diagnosis not present

## 2024-02-10 LAB — CMP (CANCER CENTER ONLY)
ALT: 15 U/L (ref 0–44)
AST: 15 U/L (ref 15–41)
Albumin: 4.6 g/dL (ref 3.5–5.0)
Alkaline Phosphatase: 50 U/L (ref 38–126)
Anion gap: 8 (ref 5–15)
BUN: 18 mg/dL (ref 6–20)
CO2: 24 mmol/L (ref 22–32)
Calcium: 9.2 mg/dL (ref 8.9–10.3)
Chloride: 108 mmol/L (ref 98–111)
Creatinine: 0.99 mg/dL (ref 0.61–1.24)
GFR, Estimated: >60 mL/min (ref >60)
Glucose, Bld: 120 mg/dL — ABNORMAL HIGH (ref 70–99)
Potassium: 3.7 mmol/L (ref 3.5–5.1)
Sodium: 140 mmol/L (ref 135–145)
Total Bilirubin: 0.4 mg/dL (ref 0.0–1.2)
Total Protein: 7 g/dL (ref 6.5–8.1)

## 2024-02-10 LAB — CBC WITH DIFFERENTIAL (CANCER CENTER ONLY)
Abs Immature Granulocytes: 0.05 10*3/uL (ref 0.00–0.07)
Basophils Absolute: 0.1 10*3/uL (ref 0.0–0.1)
Basophils Relative: 1 %
Eosinophils Absolute: 0.1 10*3/uL (ref 0.0–0.5)
Eosinophils Relative: 1 %
HCT: 47.1 % (ref 39.0–52.0)
Hemoglobin: 15.5 g/dL (ref 13.0–17.0)
Immature Granulocytes: 1 %
Lymphocytes Relative: 30 %
Lymphs Abs: 1.5 10*3/uL (ref 0.7–4.0)
MCH: 24.6 pg — ABNORMAL LOW (ref 26.0–34.0)
MCHC: 32.9 g/dL (ref 30.0–36.0)
MCV: 74.9 fL — ABNORMAL LOW (ref 80.0–100.0)
Monocytes Absolute: 0.4 10*3/uL (ref 0.1–1.0)
Monocytes Relative: 9 %
Neutro Abs: 2.9 10*3/uL (ref 1.7–7.7)
Neutrophils Relative %: 58 %
Platelet Count: 189 10*3/uL (ref 150–400)
RBC: 6.29 MIL/uL — ABNORMAL HIGH (ref 4.22–5.81)
RDW: 15.4 % (ref 11.5–15.5)
WBC Count: 5.1 10*3/uL (ref 4.0–10.5)
nRBC: 0 % (ref 0.0–0.2)

## 2024-02-10 LAB — LACTATE DEHYDROGENASE: LDH: 138 U/L (ref 98–192)

## 2024-02-10 MED ORDER — SODIUM CHLORIDE 0.9 % IV SOLN: INTRAVENOUS | Status: DC

## 2024-02-10 MED ORDER — ACETAMINOPHEN 325 MG PO TABS
650.0000 mg | ORAL_TABLET | Freq: Once | ORAL | Status: AC
  Administered 2024-02-10: 650 mg via ORAL
  Filled 2024-02-10: qty 2

## 2024-02-10 MED ORDER — FAMOTIDINE IN NACL 20-0.9 MG/50ML-% IV SOLN
20.0000 mg | Freq: Once | INTRAVENOUS | Status: AC
  Administered 2024-02-10: 20 mg via INTRAVENOUS
  Filled 2024-02-10: qty 50

## 2024-02-10 MED ORDER — DIPHENHYDRAMINE HCL 50 MG/ML IJ SOLN
50.0000 mg | Freq: Once | INTRAMUSCULAR | Status: AC
  Administered 2024-02-10: 50 mg via INTRAVENOUS
  Filled 2024-02-10: qty 1

## 2024-02-10 MED ORDER — SODIUM CHLORIDE 0.9 % IV SOLN
20.0000 mg | Freq: Once | INTRAVENOUS | Status: AC
  Administered 2024-02-10: 20 mg via INTRAVENOUS
  Filled 2024-02-10: qty 20

## 2024-02-10 MED ORDER — SODIUM CHLORIDE 0.9 % IV SOLN
1000.0000 mg | Freq: Once | INTRAVENOUS | Status: AC
  Administered 2024-02-10: 1000 mg via INTRAVENOUS
  Filled 2024-02-10: qty 40

## 2024-02-11 ENCOUNTER — Inpatient Hospital Stay

## 2024-02-11 ENCOUNTER — Other Ambulatory Visit: Payer: Self-pay | Admitting: Hematology and Oncology

## 2024-02-11 DIAGNOSIS — C9110 Chronic lymphocytic leukemia of B-cell type not having achieved remission: Secondary | ICD-10-CM

## 2024-02-11 DIAGNOSIS — Z5112 Encounter for antineoplastic immunotherapy: Secondary | ICD-10-CM | POA: Diagnosis not present

## 2024-02-11 LAB — CMP (CANCER CENTER ONLY)
ALT: 13 U/L (ref 0–44)
AST: 14 U/L — ABNORMAL LOW (ref 15–41)
Albumin: 4.7 g/dL (ref 3.5–5.0)
Alkaline Phosphatase: 44 U/L (ref 38–126)
Anion gap: 7 (ref 5–15)
BUN: 20 mg/dL (ref 6–20)
CO2: 25 mmol/L (ref 22–32)
Calcium: 9.6 mg/dL (ref 8.9–10.3)
Chloride: 108 mmol/L (ref 98–111)
Creatinine: 0.96 mg/dL (ref 0.61–1.24)
GFR, Estimated: >60 mL/min (ref >60)
Glucose, Bld: 108 mg/dL — ABNORMAL HIGH (ref 70–99)
Potassium: 3.9 mmol/L (ref 3.5–5.1)
Sodium: 140 mmol/L (ref 135–145)
Total Bilirubin: 0.5 mg/dL (ref 0.0–1.2)
Total Protein: 7 g/dL (ref 6.5–8.1)

## 2024-02-11 LAB — CBC WITH DIFFERENTIAL (CANCER CENTER ONLY)
Abs Immature Granulocytes: 0.07 10*3/uL (ref 0.00–0.07)
Basophils Absolute: 0 10*3/uL (ref 0.0–0.1)
Basophils Relative: 0 %
Eosinophils Absolute: 0 10*3/uL (ref 0.0–0.5)
Eosinophils Relative: 0 %
HCT: 44 % (ref 39.0–52.0)
Hemoglobin: 14.7 g/dL (ref 13.0–17.0)
Immature Granulocytes: 1 %
Lymphocytes Relative: 15 %
Lymphs Abs: 1.4 10*3/uL (ref 0.7–4.0)
MCH: 24.8 pg — ABNORMAL LOW (ref 26.0–34.0)
MCHC: 33.4 g/dL (ref 30.0–36.0)
MCV: 74.2 fL — ABNORMAL LOW (ref 80.0–100.0)
Monocytes Absolute: 0.8 10*3/uL (ref 0.1–1.0)
Monocytes Relative: 8 %
Neutro Abs: 6.9 10*3/uL (ref 1.7–7.7)
Neutrophils Relative %: 76 %
Platelet Count: 152 10*3/uL (ref 150–400)
RBC: 5.93 MIL/uL — ABNORMAL HIGH (ref 4.22–5.81)
RDW: 15.1 % (ref 11.5–15.5)
WBC Count: 9.2 10*3/uL (ref 4.0–10.5)
nRBC: 0 % (ref 0.0–0.2)

## 2024-02-11 LAB — LACTATE DEHYDROGENASE: LDH: 134 U/L (ref 98–192)

## 2024-02-11 LAB — URIC ACID: Uric Acid, Serum: 4.5 mg/dL (ref 3.7–8.6)

## 2024-02-12 ENCOUNTER — Telehealth: Payer: Self-pay

## 2024-02-12 NOTE — Telephone Encounter (Signed)
 [facility] Clinical Social Work  Clinical Social Work was referred by self for assessment of psychosocial needs.  Clinical Social Worker contacted patient by phone to offer support and assess for needs. Patient expressed need for financial assistance. Patient does not meet eligibility at this time for [employer]. CSW sent referral for cancer services and sent phone number for LLS. Patient has direct contact if additional needs arise.   Maudie Sorrow, LCSW  Clinical Social Worker [facility]

## 2024-02-13 ENCOUNTER — Telehealth: Payer: Self-pay | Admitting: Primary Care

## 2024-02-13 NOTE — Telephone Encounter (Signed)
 CNM received for CPAP Device

## 2024-02-14 NOTE — Telephone Encounter (Signed)
 CMN received from [employer] for Cpap Supplies

## 2024-02-16 ENCOUNTER — Other Ambulatory Visit: Payer: Self-pay | Admitting: Internal Medicine

## 2024-02-16 DIAGNOSIS — E119 Type 2 diabetes mellitus without complications: Secondary | ICD-10-CM

## 2024-02-16 DIAGNOSIS — I10 Essential (primary) hypertension: Secondary | ICD-10-CM

## 2024-02-17 ENCOUNTER — Inpatient Hospital Stay: Attending: Hematology and Oncology

## 2024-02-17 ENCOUNTER — Telehealth: Payer: Self-pay | Admitting: *Deleted

## 2024-02-17 DIAGNOSIS — Z803 Family history of malignant neoplasm of breast: Secondary | ICD-10-CM | POA: Diagnosis not present

## 2024-02-17 DIAGNOSIS — R5383 Other fatigue: Secondary | ICD-10-CM | POA: Insufficient documentation

## 2024-02-17 DIAGNOSIS — C9110 Chronic lymphocytic leukemia of B-cell type not having achieved remission: Secondary | ICD-10-CM | POA: Insufficient documentation

## 2024-02-17 DIAGNOSIS — Z87891 Personal history of nicotine dependence: Secondary | ICD-10-CM | POA: Insufficient documentation

## 2024-02-17 DIAGNOSIS — Z5112 Encounter for antineoplastic immunotherapy: Secondary | ICD-10-CM | POA: Insufficient documentation

## 2024-02-17 LAB — CBC WITH DIFFERENTIAL (CANCER CENTER ONLY)
Abs Immature Granulocytes: 0.06 10*3/uL (ref 0.00–0.07)
Basophils Absolute: 0 10*3/uL (ref 0.0–0.1)
Basophils Relative: 1 %
Eosinophils Absolute: 0.1 10*3/uL (ref 0.0–0.5)
Eosinophils Relative: 1 %
HCT: 46.7 % (ref 39.0–52.0)
Hemoglobin: 15.6 g/dL (ref 13.0–17.0)
Immature Granulocytes: 1 %
Lymphocytes Relative: 33 %
Lymphs Abs: 1.8 10*3/uL (ref 0.7–4.0)
MCH: 24.9 pg — ABNORMAL LOW (ref 26.0–34.0)
MCHC: 33.4 g/dL (ref 30.0–36.0)
MCV: 74.5 fL — ABNORMAL LOW (ref 80.0–100.0)
Monocytes Absolute: 0.6 10*3/uL (ref 0.1–1.0)
Monocytes Relative: 11 %
Neutro Abs: 2.8 10*3/uL (ref 1.7–7.7)
Neutrophils Relative %: 53 %
Platelet Count: 158 10*3/uL (ref 150–400)
RBC: 6.27 MIL/uL — ABNORMAL HIGH (ref 4.22–5.81)
RDW: 16.6 % — ABNORMAL HIGH (ref 11.5–15.5)
WBC Count: 5.3 10*3/uL (ref 4.0–10.5)
nRBC: 0 % (ref 0.0–0.2)

## 2024-02-17 LAB — CMP (CANCER CENTER ONLY)
ALT: 14 U/L (ref 0–44)
AST: 15 U/L (ref 15–41)
Albumin: 4.6 g/dL (ref 3.5–5.0)
Alkaline Phosphatase: 55 U/L (ref 38–126)
Anion gap: 6 (ref 5–15)
BUN: 20 mg/dL (ref 6–20)
CO2: 25 mmol/L (ref 22–32)
Calcium: 9.2 mg/dL (ref 8.9–10.3)
Chloride: 106 mmol/L (ref 98–111)
Creatinine: 1.03 mg/dL (ref 0.61–1.24)
GFR, Estimated: >60 mL/min (ref >60)
Glucose, Bld: 132 mg/dL — ABNORMAL HIGH (ref 70–99)
Potassium: 4.1 mmol/L (ref 3.5–5.1)
Sodium: 137 mmol/L (ref 135–145)
Total Bilirubin: 0.4 mg/dL (ref 0.0–1.2)
Total Protein: 7 g/dL (ref 6.5–8.1)

## 2024-02-17 LAB — LACTATE DEHYDROGENASE: LDH: 175 U/L (ref 98–192)

## 2024-02-17 LAB — URIC ACID: Uric Acid, Serum: 4.9 mg/dL (ref 3.7–8.6)

## 2024-02-17 MED ORDER — NEBIVOLOL HCL 10 MG PO TABS: 10.0000 mg | ORAL_TABLET | Freq: Every day | ORAL | 0 refills | Status: DC

## 2024-02-17 MED ORDER — AMLODIPINE BESYLATE 10 MG PO TABS: 10.0000 mg | ORAL_TABLET | Freq: Every day | ORAL | 0 refills | Status: DC

## 2024-02-17 MED ORDER — LOSARTAN POTASSIUM 100 MG PO TABS: 100.0000 mg | ORAL_TABLET | Freq: Every day | ORAL | 0 refills | Status: DC

## 2024-02-17 NOTE — Telephone Encounter (Signed)
 TCT patient regarding recent lab results. Spoke with him. Advised that his lab work looks good today. He is doing well with his venetoclax  so far. Pt voiced understanding.

## 2024-02-17 NOTE — Telephone Encounter (Signed)
-----   Message from Darilyn Edin sent at 02/17/2024 12:15 PM EDT ----- Labs look good. No intervention needed. ----- Message ----- From: Dannis Dy, Lab In [place] Sent: 02/17/2024   9:26 AM EDT To: Ander Bame, MD

## 2024-02-18 ENCOUNTER — Inpatient Hospital Stay

## 2024-02-18 DIAGNOSIS — Z5112 Encounter for antineoplastic immunotherapy: Secondary | ICD-10-CM | POA: Diagnosis not present

## 2024-02-18 DIAGNOSIS — C9110 Chronic lymphocytic leukemia of B-cell type not having achieved remission: Secondary | ICD-10-CM

## 2024-02-18 LAB — CBC WITH DIFFERENTIAL (CANCER CENTER ONLY)
Abs Immature Granulocytes: 0.07 10*3/uL (ref 0.00–0.07)
Basophils Absolute: 0 10*3/uL (ref 0.0–0.1)
Basophils Relative: 1 %
Eosinophils Absolute: 0 10*3/uL (ref 0.0–0.5)
Eosinophils Relative: 1 %
HCT: 46.3 % (ref 39.0–52.0)
Hemoglobin: 15.3 g/dL (ref 13.0–17.0)
Immature Granulocytes: 1 %
Lymphocytes Relative: 33 %
Lymphs Abs: 1.6 10*3/uL (ref 0.7–4.0)
MCH: 24.7 pg — ABNORMAL LOW (ref 26.0–34.0)
MCHC: 33 g/dL (ref 30.0–36.0)
MCV: 74.8 fL — ABNORMAL LOW (ref 80.0–100.0)
Monocytes Absolute: 0.5 10*3/uL (ref 0.1–1.0)
Monocytes Relative: 10 %
Neutro Abs: 2.7 10*3/uL (ref 1.7–7.7)
Neutrophils Relative %: 54 %
Platelet Count: 156 10*3/uL (ref 150–400)
RBC: 6.19 MIL/uL — ABNORMAL HIGH (ref 4.22–5.81)
RDW: 15.7 % — ABNORMAL HIGH (ref 11.5–15.5)
WBC Count: 5 10*3/uL (ref 4.0–10.5)
nRBC: 0 % (ref 0.0–0.2)

## 2024-02-18 LAB — CMP (CANCER CENTER ONLY)
ALT: 14 U/L (ref 0–44)
AST: 15 U/L (ref 15–41)
Albumin: 4.6 g/dL (ref 3.5–5.0)
Alkaline Phosphatase: 53 U/L (ref 38–126)
Anion gap: 7 (ref 5–15)
BUN: 16 mg/dL (ref 6–20)
CO2: 27 mmol/L (ref 22–32)
Calcium: 9.2 mg/dL (ref 8.9–10.3)
Chloride: 106 mmol/L (ref 98–111)
Creatinine: 1.05 mg/dL (ref 0.61–1.24)
GFR, Estimated: >60 mL/min (ref >60)
Glucose, Bld: 128 mg/dL — ABNORMAL HIGH (ref 70–99)
Potassium: 3.9 mmol/L (ref 3.5–5.1)
Sodium: 140 mmol/L (ref 135–145)
Total Bilirubin: 0.4 mg/dL (ref 0.0–1.2)
Total Protein: 7.1 g/dL (ref 6.5–8.1)

## 2024-02-18 LAB — LACTATE DEHYDROGENASE: LDH: 129 U/L (ref 98–192)

## 2024-02-18 LAB — URIC ACID: Uric Acid, Serum: 4.9 mg/dL (ref 3.7–8.6)

## 2024-02-18 NOTE — Telephone Encounter (Signed)
 Rc'd signed copy. Will fax to [phone]

## 2024-02-19 ENCOUNTER — Other Ambulatory Visit: Payer: Self-pay

## 2024-02-19 NOTE — Telephone Encounter (Signed)
 CMN faxed successfully and signed.

## 2024-02-20 ENCOUNTER — Telehealth: Payer: Self-pay | Admitting: *Deleted

## 2024-02-20 ENCOUNTER — Other Ambulatory Visit (HOSPITAL_COMMUNITY): Payer: Self-pay

## 2024-02-20 ENCOUNTER — Other Ambulatory Visit: Payer: Self-pay | Admitting: Internal Medicine

## 2024-02-20 DIAGNOSIS — I10 Essential (primary) hypertension: Secondary | ICD-10-CM

## 2024-02-20 NOTE — Telephone Encounter (Signed)
 TCT patient regarding labs from 02/19/24.  Spoke with him. Advised that his labs are stable on his current dose of the Venetoclax . He voiced understanding. He states he feels well and is currently working-he is a [employer]. Advised to call with any questions or concerns. He is aware of his future lab appts.

## 2024-02-20 NOTE — Telephone Encounter (Signed)
-----   Message from Darilyn Edin sent at 02/19/2024  4:10 PM EDT ----- Day 2 labs look stable. No intervention needed. ----- Message ----- From: Dannis Dy, Lab In [place] Sent: 02/18/2024   9:18 AM EDT To: Ander Bame, MD

## 2024-02-24 ENCOUNTER — Inpatient Hospital Stay

## 2024-02-24 ENCOUNTER — Ambulatory Visit
Admission: RE | Admit: 2024-02-24 | Discharge: 2024-02-24 | Disposition: A | Discharge status: Home / Self Care | Source: Ambulatory Visit | Attending: Internal Medicine | Admitting: Internal Medicine

## 2024-02-24 DIAGNOSIS — Z5112 Encounter for antineoplastic immunotherapy: Secondary | ICD-10-CM | POA: Diagnosis not present

## 2024-02-24 DIAGNOSIS — Z122 Encounter for screening for malignant neoplasm of respiratory organs: Secondary | ICD-10-CM

## 2024-02-24 DIAGNOSIS — Z87891 Personal history of nicotine dependence: Secondary | ICD-10-CM

## 2024-02-24 DIAGNOSIS — C9110 Chronic lymphocytic leukemia of B-cell type not having achieved remission: Secondary | ICD-10-CM

## 2024-02-24 LAB — CBC WITH DIFFERENTIAL (CANCER CENTER ONLY)
Abs Immature Granulocytes: 0.05 10*3/uL (ref 0.00–0.07)
Basophils Absolute: 0 10*3/uL (ref 0.0–0.1)
Basophils Relative: 1 %
Eosinophils Absolute: 0 10*3/uL (ref 0.0–0.5)
Eosinophils Relative: 1 %
HCT: 46.4 % (ref 39.0–52.0)
Hemoglobin: 15.2 g/dL (ref 13.0–17.0)
Immature Granulocytes: 1 %
Lymphocytes Relative: 35 %
Lymphs Abs: 2.2 10*3/uL (ref 0.7–4.0)
MCH: 24.6 pg — ABNORMAL LOW (ref 26.0–34.0)
MCHC: 32.8 g/dL (ref 30.0–36.0)
MCV: 75.1 fL — ABNORMAL LOW (ref 80.0–100.0)
Monocytes Absolute: 0.6 10*3/uL (ref 0.1–1.0)
Monocytes Relative: 10 %
Neutro Abs: 3.3 10*3/uL (ref 1.7–7.7)
Neutrophils Relative %: 52 %
Platelet Count: 189 10*3/uL (ref 150–400)
RBC: 6.18 MIL/uL — ABNORMAL HIGH (ref 4.22–5.81)
RDW: 15.8 % — ABNORMAL HIGH (ref 11.5–15.5)
WBC Count: 6.2 10*3/uL (ref 4.0–10.5)
nRBC: 0 % (ref 0.0–0.2)

## 2024-02-24 LAB — CMP (CANCER CENTER ONLY)
ALT: 15 U/L (ref 0–44)
AST: 16 U/L (ref 15–41)
Albumin: 4.6 g/dL (ref 3.5–5.0)
Alkaline Phosphatase: 56 U/L (ref 38–126)
Anion gap: 8 (ref 5–15)
BUN: 18 mg/dL (ref 6–20)
CO2: 24 mmol/L (ref 22–32)
Calcium: 9.5 mg/dL (ref 8.9–10.3)
Chloride: 107 mmol/L (ref 98–111)
Creatinine: 0.99 mg/dL (ref 0.61–1.24)
GFR, Estimated: >60 mL/min (ref >60)
Glucose, Bld: 119 mg/dL — ABNORMAL HIGH (ref 70–99)
Potassium: 3.9 mmol/L (ref 3.5–5.1)
Sodium: 139 mmol/L (ref 135–145)
Total Bilirubin: 0.4 mg/dL (ref 0.0–1.2)
Total Protein: 7.1 g/dL (ref 6.5–8.1)

## 2024-02-24 LAB — LACTATE DEHYDROGENASE: LDH: 132 U/L (ref 98–192)

## 2024-02-24 LAB — URIC ACID: Uric Acid, Serum: 4.5 mg/dL (ref 3.7–8.6)

## 2024-02-25 ENCOUNTER — Other Ambulatory Visit: Payer: Self-pay

## 2024-02-25 ENCOUNTER — Other Ambulatory Visit (HOSPITAL_COMMUNITY): Payer: Self-pay

## 2024-02-25 ENCOUNTER — Other Ambulatory Visit: Payer: Self-pay | Admitting: Hematology and Oncology

## 2024-02-25 NOTE — Progress Notes (Signed)
 Specialty Pharmacy Refill Coordination Note  [name] is a 58 y.o. male contacted today regarding refills of specialty medication(s) Venetoclax  (VENCLEXTA )   Patient requested Kyilee Gregg Dk at [facility] at [place] date: 03/03/24   Medication will be filled on 03/03/24.

## 2024-02-25 NOTE — Progress Notes (Signed)
 Specialty Pharmacy Ongoing Clinical Assessment Note  James Parrish is a 58 y.o. male who is being followed by the specialty pharmacy service for RxSp Oncology   Patient's specialty medication(s) reviewed today: Venetoclax  (VENCLEXTA )   Missed doses in the last 4 weeks: 0   Patient/Caregiver did not have any additional questions or concerns.   Therapeutic benefit summary: Unable to assess   Adverse events/side effects summary: Experienced adverse events/side effects (fatigue, tolerable at this time)   Patient's therapy is appropriate to: Continue    Goals Addressed             This Visit's Progress    Maintain optimal adherence to therapy   No change    Patient is initiating therapy. Patient will maintain adherence         Follow up: 3 months  Malachi Screws Specialty Pharmacist

## 2024-02-27 ENCOUNTER — Encounter: Payer: Self-pay | Admitting: *Deleted

## 2024-02-27 ENCOUNTER — Telehealth: Payer: Self-pay

## 2024-02-27 NOTE — Telephone Encounter (Signed)
 Copied from CRM [id number]. Topic: General - Other >> Feb 17, 2024 11:11 AM Eveleen Hinds B wrote: Reason for CRM:  Patient calling on CPAP supplies.  Patient call [employer] today and they state they do not have an order. Per notes, Carolyn Cisco states order was sent on 02/13/2024. Patient would like a call back. (Patient states insurance is requesting documentation).   I called and spoke to pt. Pt states he has already spoke to Irby Mannan, NP and he has finally received his supplies. Pt stated he is good to go and will call our office if anything is needed. NFN

## 2024-03-01 ENCOUNTER — Other Ambulatory Visit: Payer: Self-pay | Admitting: Internal Medicine

## 2024-03-01 DIAGNOSIS — I10 Essential (primary) hypertension: Secondary | ICD-10-CM

## 2024-03-01 NOTE — Progress Notes (Signed)
 Rescheduled

## 2024-03-02 ENCOUNTER — Telehealth: Payer: Self-pay | Admitting: *Deleted

## 2024-03-02 ENCOUNTER — Inpatient Hospital Stay (HOSPITAL_BASED_OUTPATIENT_CLINIC_OR_DEPARTMENT_OTHER): Payer: 59 | Admitting: Hematology and Oncology

## 2024-03-02 ENCOUNTER — Other Ambulatory Visit: Payer: Self-pay

## 2024-03-02 ENCOUNTER — Inpatient Hospital Stay: Payer: 59

## 2024-03-02 DIAGNOSIS — C9110 Chronic lymphocytic leukemia of B-cell type not having achieved remission: Secondary | ICD-10-CM

## 2024-03-02 DIAGNOSIS — D72820 Lymphocytosis (symptomatic): Secondary | ICD-10-CM

## 2024-03-02 NOTE — Telephone Encounter (Signed)
 Received call from pt stating he will be unable to make it to his appt today. Pt is scheduled to see him now on 03/10/24

## 2024-03-03 ENCOUNTER — Other Ambulatory Visit: Payer: Self-pay

## 2024-03-03 ENCOUNTER — Encounter: Payer: Self-pay | Admitting: Hematology and Oncology

## 2024-03-05 ENCOUNTER — Telehealth: Payer: Self-pay

## 2024-03-05 ENCOUNTER — Other Ambulatory Visit: Payer: Self-pay | Admitting: Hematology and Oncology

## 2024-03-05 ENCOUNTER — Other Ambulatory Visit (HOSPITAL_COMMUNITY): Payer: Self-pay

## 2024-03-05 ENCOUNTER — Other Ambulatory Visit: Payer: Self-pay

## 2024-03-05 DIAGNOSIS — C9110 Chronic lymphocytic leukemia of B-cell type not having achieved remission: Secondary | ICD-10-CM

## 2024-03-05 MED ORDER — VENETOCLAX 100 MG PO TABS
400.0000 mg | ORAL_TABLET | Freq: Every day | ORAL | 0 refills | Status: DC
  Filled 2024-03-05: qty 112, 28d supply, fill #0

## 2024-03-05 MED ORDER — VENCLEXTA STARTING PACK 10 & 50 & 100 MG PO TBPK: ORAL_TABLET | ORAL | 0 refills | Status: DC

## 2024-03-05 NOTE — Telephone Encounter (Signed)
 Oncology Pharmacist Encounter  Prescription refill sent in for venetoclax  starter pack again in error. Discussed with Dr. Rosaline Coma and sent in prescription for venetoclax  400mg  daily per discussion with cosign request. Medication sent to [facility] specialty pharmacy for fill.  Lamarius Dirr, PharmD Hematology/Oncology Clinical Pharmacist [facility] [phone]

## 2024-03-06 MED FILL — Dexamethasone Sodium Phosphate Inj 100 MG/10ML: INTRAMUSCULAR | Qty: 2 | Status: AC

## 2024-03-09 NOTE — Progress Notes (Unsigned)
 [facility] [phone]   [phone]  PROGRESS NOTE  Patient Care Team: Arcadio Knuckles, MD as PCP - General (Internal Medicine) Jacqueline Matsu, MD as PCP - Sleep Medicine (Cardiology)  Hematological/Oncological History # CLL, Rai Stage 0. Del 11 01/25/2020: WBC 9.6, Hgb 15.6, MCV 79.1, Plt 241 05/29/2021: WBC 13.3, Hgb 15.4, MCV 76.9, Plt 203 11/16/2021: patient had routine CBC collected, showed WBC 15.9, Hgb 14.8, MCV 76.4, ALC 11.6 12/14/2021: establish care with Dr. Rosaline Coma  05/24/2023: WBC 43.2, Hgb 14.6, MCV 77.8, Plt 217 11/25/2023: WBC 95.9, Hgb 15.4, MCV 77.8, Plt 221   Interval History:  James Parrish 58 y.o. male with medical history significant for CLL who presents for a follow up visit. The patient's last visit was on 11/24/2022. In the interim, he denies any changes to his health.   On exam today James Parrish reports reports with treatment 1 he was having some issues with discomfort in his lower back.  He notes that Pepcid  worked well he did have 2 attacks like this.  He reports he had no trouble with the second infusion.  He reports since then his energy levels have been so-so.  He notes that he has been able to do yard work but is taking frequent naps.  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  He is not feeling drowsy on the job.  He reports his appetite has been strong and he is "started putting on weight".  He had no infectious symptoms such as runny nose, sore throat, cough.  He denies any fevers, chills, sweats.  Overall he is willing and able to proceed with treatment this time.  A full 10 point ROS is otherwise negative.  Today we discussed the neck step in treatment which is adding in the venetoclax .  We recommend starting as scheduled for the start of cycle #2.  He voices understanding of the lab requirements and the risks of starting an aquatic therapy and was willing and able to proceed.  MEDICAL HISTORY:  Past  Medical History:  Diagnosis Date   CLL (chronic lymphocytic leukemia) (HCC)    Complication of anesthesia    hard to wake up    Diabetes mellitus without complication [facility])    ED (erectile dysfunction)    History of COVID-19 04/2019   Hyperlipidemia    Hypertension    Sleep apnea    uses CPAP nightly    SURGICAL HISTORY: Past Surgical History:  Procedure Laterality Date   BRONCHIAL NEEDLE ASPIRATION BIOPSY  05/29/2021   Procedure: BRONCHIAL NEEDLE ASPIRATION BIOPSIES;  Surgeon: Prudy Brownie, DO;  Location: [facility];  Service: Pulmonary;;   COLONOSCOPY     finger surgery Right    Index   KNEE SURGERY Left    KNEE SURGERY Right    ORIF PATELLA Right    REVERSE SHOULDER ARTHROPLASTY Right 08/31/2020   Procedure: REVERSE SHOULDER ARTHROPLASTY;  Surgeon: Micheline Ahr, MD;  Location: [facility];  Service: Orthopedics;  Laterality: Right;   SHOULDER ARTHROSCOPY WITH ROTATOR CUFF REPAIR Left 02/04/2020   Procedure: SHOULDER ARTHROSCOPY WITH ROTATOR CUFF REPAIR WITH BICEP TENODESIS;  Surgeon: Micheline Ahr, MD;  Location: [facility];  Service: Orthopedics;  Laterality: Left;   VIDEO BRONCHOSCOPY WITH ENDOBRONCHIAL ULTRASOUND Left 05/29/2021   Procedure: VIDEO BRONCHOSCOPY WITH ENDOBRONCHIAL ULTRASOUND;  Surgeon: Prudy Brownie, DO;  Location: [facility];  Service: Pulmonary;  Laterality: Left;  SOCIAL HISTORY: Social History   Socioeconomic History   Marital status: Married    Spouse name: Not on file   Number of children: Not on file   Years of education: Not on file   Highest education level: 12th grade  Occupational History   Not on file  Tobacco Use   Smoking status: Former    Current packs/day: 0.00    Average packs/day: 1.5 packs/day for 25.0 years (37.5 ttl pk-yrs)    Types: Cigarettes    Start date: 04/1995    Quit date: 04/2020    Years since quitting: 3.9    Passive exposure: Past   Smokeless tobacco: Never  Vaping Use    Vaping status: Never Used  Substance and Sexual Activity   Alcohol use: Not Currently    Comment: occasionally   Drug use: Never   Sexual activity: Yes    Partners: Female  Other Topics Concern   Not on file  Social History Narrative   Not on file   Social Drivers of Health   Financial Resource Strain: High Risk (12/28/2023)   Overall Financial Resource Strain (CARDIA)    Difficulty of Paying Living Expenses: Very hard  Food Insecurity: Food Insecurity Present (12/28/2023)   Hunger Vital Sign    Worried About Running Out of Food in the Last Year: Often true    Ran Out of Food in the Last Year: Often true  Transportation Needs: No Transportation Needs (12/28/2023)   PRAPARE - Administrator, Civil Service (Medical): No    Lack of Transportation (Non-Medical): No  Physical Activity: Insufficiently Active (12/28/2023)   Exercise Vital Sign    Days of Exercise per Week: 3 days    Minutes of Exercise per Session: 10 min  Stress: Stress Concern Present (12/28/2023)   [employer] of Occupational Health - Occupational Stress Questionnaire    Feeling of Stress : To some extent  Social Connections: Socially Isolated (12/28/2023)   Social Connection and Isolation Panel [NHANES]    Frequency of Communication with Friends and Family: Twice a week    Frequency of Social Gatherings with Friends and Family: Never    Attends Religious Services: Never    Diplomatic Services operational officer: No    Attends Engineer, structural: Not on file    Marital Status: Married  Catering manager Violence: Not on file    FAMILY HISTORY: Family History  Problem Relation Age of Onset   Cancer Mother        breast cancer    Hypertension Mother    Diabetes Mother    Venous thrombosis Son    Colon cancer Neg Hx    Esophageal cancer Neg Hx    Stomach cancer Neg Hx    Rectal cancer Neg Hx     ALLERGIES:  is allergic to gazyva  [obinutuzumab ].  MEDICATIONS:  Current  Outpatient Medications  Medication Sig Dispense Refill   allopurinol  (ZYLOPRIM ) 300 MG tablet Take 1 tablet (300 mg total) by mouth daily. 30 tablet 3   amLODipine  (NORVASC ) 10 MG tablet Take 1 tablet (10 mg total) by mouth at bedtime. 90 tablet 0   atorvastatin  (LIPITOR) 10 MG tablet TAKE 1 TABLET BY MOUTH DAILY 30 tablet 3   dapagliflozin  propanediol (FARXIGA ) 10 MG TABS tablet Take 1 tablet (10 mg total) by mouth daily before breakfast. 90 tablet 3   diclofenac (VOLTAREN) 75 MG EC tablet Take 75 mg by mouth 2 (two) times daily.  glucose blood test strip Use as instructed to test blood sugar 2 times daily E11.65    Onetouch strips 100 each 12   indapamide  (LOZOL ) 1.25 MG tablet TAKE 1 TABLET BY MOUTH DAILY 90 tablet 0   insulin  glargine (LANTUS  SOLOSTAR) 100 UNIT/ML Solostar Pen Inject 30 Units into the skin daily. 30 mL 6   Insulin  Pen Needle 29G X MISC 1 Device by Does not apply route daily in the afternoon. 100 each 3   losartan  (COZAAR ) 100 MG tablet Take 1 tablet (100 mg total) by mouth daily. 90 tablet 0   metFORMIN  (GLUCOPHAGE ) 1000 MG tablet Take 1 tablet (1,000 mg total) by mouth 2 (two) times daily with a meal. 180 tablet 3   Multiple Vitamin (MULTIVITAMIN WITH MINERALS) TABS tablet Take 1 tablet by mouth daily.     nebivolol  (BYSTOLIC ) 10 MG tablet Take 1 tablet (10 mg total) by mouth daily. 90 tablet 0   ondansetron  (ZOFRAN ) 8 MG tablet Take 1 tablet (8 mg total) by mouth every 8 (eight) hours as needed for nausea or vomiting. 20 tablet 2   oseltamivir  (TAMIFLU ) 75 MG capsule Take 1 capsule (75 mg total) by mouth 2 (two) times daily. (Patient not taking: Reported on 02/05/2024) 10 capsule 0   prochlorperazine  (COMPAZINE ) 10 MG tablet Take 1 tablet (10 mg total) by mouth every 6 (six) hours as needed for nausea or vomiting. 30 tablet 2   promethazine -dextromethorphan (PROMETHAZINE -DM) 6.25-15 MG/5ML syrup Take 5 mLs by mouth 3 (three) times daily as needed for cough. (Patient not  taking: Reported on 02/05/2024) 200 mL 0   Semaglutide , 2 MG/DOSE, (OZEMPIC , 2 MG/DOSE,) 8 MG/3ML SOPN DIAL AND INJECT UNDER THE SKIN 2 MG WEEKLY 3 mL 1   spironolactone  (ALDACTONE ) 50 MG tablet TAKE 1 TABLET BY MOUTH DAILY 90 tablet 0   tadalafil  (CIALIS ) 20 MG tablet TAKE 1 TABLET BY MOUTH DAILY AS NEEDED FOR ERECTILE DYSFUNCTION 10 tablet 2   venetoclax  (VENCLEXTA ) 100 MG tablet Take 4 tablets (400 mg total) by mouth daily. Tablets should be swallowed whole with a meal and a full glass of water. 112 tablet 0   No current facility-administered medications for this visit.    REVIEW OF SYSTEMS:   Constitutional: ( - ) fevers, ( - )  chills , ( - ) night sweats Eyes: ( - ) blurriness of vision, ( - ) double vision, ( - ) watery eyes Ears, nose, mouth, throat, and face: ( - ) mucositis, ( - ) sore throat Respiratory: ( - ) cough, ( - ) dyspnea, ( - ) wheezes Cardiovascular: ( - ) palpitation, ( - ) chest discomfort, ( - ) lower extremity swelling Gastrointestinal:  ( - ) nausea, ( - ) heartburn, ( - ) change in bowel habits Skin: ( - ) abnormal skin rashes Lymphatics: ( - ) new lymphadenopathy, ( - ) easy bruising Neurological: ( - ) numbness, ( - ) tingling, ( - ) new weaknesses Behavioral/Psych: ( - ) mood change, ( - ) new changes  All other systems were reviewed with the patient and are negative.  PHYSICAL EXAMINATION: ECOG PERFORMANCE STATUS: 0 - Asymptomatic  There were no vitals filed for this visit.     There were no vitals filed for this visit.      GENERAL: Well-appearing middle-aged African-American male alert, no distress and comfortable SKIN: skin color, texture, turgor are normal, no rashes or significant lesions EYES: conjunctiva are pink and non-injected, sclera clear  NECK: supple, non-tender LYMPH: Mildly prominent left-sided cervical lymph nodes, no other overt lymphadenopathy noted on exam LUNGS: clear to auscultation and percussion with normal breathing  effort HEART: regular rate & rhythm and no murmurs and no lower extremity edema Musculoskeletal: no cyanosis of digits and no clubbing  PSYCH: alert & oriented x 3, fluent speech NEURO: no focal motor/sensory deficits  LABORATORY DATA:  I have reviewed the data as listed    Latest Ref Rng & Units 02/24/2024    9:18 AM 02/18/2024    9:08 AM 02/17/2024    9:07 AM  CBC  WBC 4.0 - 10.5 K/uL 6.2  5.0  5.3   Hemoglobin 13.0 - 17.0 g/dL 16.0  10.9  32.3   Hematocrit 39.0 - 52.0 % 46.4  46.3  46.7   Platelets 150 - 400 K/uL 189  156  158        Latest Ref Rng & Units 02/24/2024    9:18 AM 02/18/2024    9:08 AM 02/17/2024    9:07 AM  CMP  Glucose 70 - 99 mg/dL 557  322  025   BUN 6 - 20 mg/dL 18  16  20    Creatinine 0.61 - 1.24 mg/dL 4.27  0.62  3.76   Sodium 135 - 145 mmol/L 139  140  137   Potassium 3.5 - 5.1 mmol/L 3.9  3.9  4.1   Chloride 98 - 111 mmol/L 107  106  106   CO2 22 - 32 mmol/L 24  27  25    Calcium  8.9 - 10.3 mg/dL 9.5  9.2  9.2   Total Protein 6.5 - 8.1 g/dL 7.1  7.1  7.0   Total Bilirubin 0.0 - 1.2 mg/dL 0.4  0.4  0.4   Alkaline Phos 38 - 126 U/L 56  53  55   AST 15 - 41 U/L 16  15  15    ALT 0 - 44 U/L 15  14  14      RADIOGRAPHIC STUDIES:  No results found.  ASSESSMENT & PLAN James Parrish 58 y.o. male with medical history significant for CLL who presents for a follow up visit.  # CLL, Rai Stage 0 --Due to rapidly rising white blood cell count patient was started on obinutuzumab /venetoclax .  Cycle 1 day 1 was on 01/13/2024.  --Patient has started therapy and is tolerating it well. --Labs show white blood cell count *** -- Venetoclax  to be added with cycle 2. Will need labs on Days 1,2, 8, and 9, then weekly thereafter.  --RTC for Cycle 2 Day 1 with the start of Venetoclax  on 02/10/2024.   No orders of the defined types were placed in this encounter.   All questions were answered. The patient knows to call the clinic with any problems, questions or concerns.  I  have spent a total of 30 minutes minutes of face-to-face and non-face-to-face time, preparing to see the patient,  performing a medically appropriate examination, counseling and educating the patient, documenting clinical information in the electronic health record, and care coordination.   Rogerio Clay, MD Department of Hematology/Oncology [facility] Phone: [phone] Pager: [phone] Email: [email or web address]   03/09/2024 10:42 PM  Armando Lance BD, Catovsky D, Caligaris-Cappio F, Dighiero G, Dhner H, Hillmen P, Keating M, Montserrat E, Chiorazzi N, Stilgenbauer S, Rai KR, Bel[place], Eichhorst B, O'Brien S, Robak T, Seymour JF, Kipps TJ. iwCLL guidelines for diagnosis, indications for treatment, response assessment, and  supportive management of CLL. Blood. 2018 Jun 21;131(25):2745-2760.  Active disease should be clearly documented to initiate therapy. At least 1 of the following criteria should be met.  1) Evidence of progressive marrow failure as manifested by the development of, or worsening of, anemia and/or thrombocytopenia. Cutoff levels of Hb <10 g/dL or platelet counts <161  109/L are generally regarded as indication for treatment. However, in some patients, platelet counts <100  109/L may remain stable over a long period; this situation does not automatically require therapeutic intervention. 2) Massive (ie, >=6 cm below the left costal margin) or progressive or symptomatic splenomegaly. 3) Massive nodes (ie, >=10 cm in longest diameter) or progressive or symptomatic lymphadenopathy. 4) Progressive lymphocytosis with an increase of >=50% over a 59-month period, or lymphocyte doubling time (LDT) <6 months. LDT can be obtained by linear regression extrapolation of absolute lymphocyte counts obtained at intervals of 2 weeks over an observation period of 2 to 3 months; patients with initial blood lymphocyte counts <30  109/L may  require a longer observation period to determine the LDT. Factors contributing to lymphocytosis other than CLL (eg, infections, steroid administration) should be excluded. 5) Autoimmune complications including anemia or thrombocytopenia poorly responsive to corticosteroids. 6) Symptomatic or functional extranodal involvement (eg, skin, kidney, lung, spine). Disease-related symptoms as defined by any of the following: Unintentional weight loss >=10% within the previous 6 months. Significant fatigue (ie, ECOG performance scale 2 or worse; cannot work or unable to perform usual activities). Fevers >=100.57F or 38.0C for 2 or more weeks without evidence of infection. Night sweats for >=1 month without evidence of infection.

## 2024-03-10 ENCOUNTER — Inpatient Hospital Stay: Admitting: Hematology and Oncology

## 2024-03-10 ENCOUNTER — Inpatient Hospital Stay

## 2024-03-10 VITALS — BP 135/89 | HR 94 | Temp 97.5°F | Resp 17 | Wt 261.5 lb

## 2024-03-10 VITALS — BP 116/76 | HR 89

## 2024-03-10 DIAGNOSIS — D72820 Lymphocytosis (symptomatic): Secondary | ICD-10-CM

## 2024-03-10 DIAGNOSIS — Z5112 Encounter for antineoplastic immunotherapy: Secondary | ICD-10-CM | POA: Diagnosis not present

## 2024-03-10 DIAGNOSIS — C9110 Chronic lymphocytic leukemia of B-cell type not having achieved remission: Secondary | ICD-10-CM | POA: Diagnosis not present

## 2024-03-10 LAB — CMP (CANCER CENTER ONLY)
ALT: 14 U/L (ref 0–44)
AST: 16 U/L (ref 15–41)
Albumin: 4.6 g/dL (ref 3.5–5.0)
Alkaline Phosphatase: 55 U/L (ref 38–126)
Anion gap: 11 (ref 5–15)
BUN: 16 mg/dL (ref 6–20)
CO2: 22 mmol/L (ref 22–32)
Calcium: 9.6 mg/dL (ref 8.9–10.3)
Chloride: 106 mmol/L (ref 98–111)
Creatinine: 0.92 mg/dL (ref 0.61–1.24)
GFR, Estimated: >60 mL/min (ref >60)
Glucose, Bld: 131 mg/dL — ABNORMAL HIGH (ref 70–99)
Potassium: 3.8 mmol/L (ref 3.5–5.1)
Sodium: 139 mmol/L (ref 135–145)
Total Bilirubin: 0.3 mg/dL (ref 0.0–1.2)
Total Protein: 7.1 g/dL (ref 6.5–8.1)

## 2024-03-10 LAB — CBC WITH DIFFERENTIAL (CANCER CENTER ONLY)
Abs Immature Granulocytes: 0.03 10*3/uL (ref 0.00–0.07)
Basophils Absolute: 0 10*3/uL (ref 0.0–0.1)
Basophils Relative: 1 %
Eosinophils Absolute: 0 10*3/uL (ref 0.0–0.5)
Eosinophils Relative: 0 %
HCT: 46.2 % (ref 39.0–52.0)
Hemoglobin: 15.2 g/dL (ref 13.0–17.0)
Immature Granulocytes: 1 %
Lymphocytes Relative: 35 %
Lymphs Abs: 1.8 10*3/uL (ref 0.7–4.0)
MCH: 24.5 pg — ABNORMAL LOW (ref 26.0–34.0)
MCHC: 32.9 g/dL (ref 30.0–36.0)
MCV: 74.5 fL — ABNORMAL LOW (ref 80.0–100.0)
Monocytes Absolute: 0.6 10*3/uL (ref 0.1–1.0)
Monocytes Relative: 11 %
Neutro Abs: 2.7 10*3/uL (ref 1.7–7.7)
Neutrophils Relative %: 52 %
Platelet Count: 203 10*3/uL (ref 150–400)
RBC: 6.2 MIL/uL — ABNORMAL HIGH (ref 4.22–5.81)
RDW: 15.3 % (ref 11.5–15.5)
WBC Count: 5.1 10*3/uL (ref 4.0–10.5)
nRBC: 0 % (ref 0.0–0.2)

## 2024-03-10 LAB — URIC ACID: Uric Acid, Serum: 4.3 mg/dL (ref 3.7–8.6)

## 2024-03-10 LAB — LACTATE DEHYDROGENASE: LDH: 131 U/L (ref 98–192)

## 2024-03-10 MED ORDER — ACETAMINOPHEN 325 MG PO TABS
650.0000 mg | ORAL_TABLET | Freq: Once | ORAL | Status: AC
  Administered 2024-03-10: 650 mg via ORAL
  Filled 2024-03-10: qty 2

## 2024-03-10 MED ORDER — FAMOTIDINE IN NACL 20-0.9 MG/50ML-% IV SOLN
20.0000 mg | Freq: Once | INTRAVENOUS | Status: AC
  Administered 2024-03-10: 20 mg via INTRAVENOUS
  Filled 2024-03-10: qty 50

## 2024-03-10 MED ORDER — SODIUM CHLORIDE 0.9 % IV SOLN
1000.0000 mg | Freq: Once | INTRAVENOUS | Status: AC
  Administered 2024-03-10: 1000 mg via INTRAVENOUS
  Filled 2024-03-10: qty 40

## 2024-03-10 MED ORDER — SODIUM CHLORIDE 0.9 % IV SOLN: INTRAVENOUS | Status: DC

## 2024-03-10 MED ORDER — DIPHENHYDRAMINE HCL 50 MG/ML IJ SOLN
50.0000 mg | Freq: Once | INTRAMUSCULAR | Status: AC
  Administered 2024-03-10: 50 mg via INTRAVENOUS
  Filled 2024-03-10: qty 1

## 2024-03-10 MED ORDER — SODIUM CHLORIDE 0.9 % IV SOLN
20.0000 mg | Freq: Once | INTRAVENOUS | Status: AC
  Administered 2024-03-10: 20 mg via INTRAVENOUS
  Filled 2024-03-10: qty 20

## 2024-03-10 NOTE — Patient Instructions (Signed)
 [facility] - A DEPT OF [facility]  Discharge Instructions: Thank you for choosing [facility] to provide your oncology and hematology care.   If you have a lab appointment with the Cancer Center, please go directly to the Cancer Center and check in at the registration area.   Wear comfortable clothing and clothing appropriate for easy access to any Portacath or PICC line.   We strive to give you quality time with your provider. You may need to reschedule your appointment if you arrive late (15 or more minutes).  Arriving late affects you and other patients whose appointments are after yours.  Also, if you miss three or more appointments without notifying the office, you may be dismissed from the clinic at the provider's discretion.      For prescription refill requests, have your pharmacy contact our office and allow 72 hours for refills to be completed.    Today you received the following chemotherapy and/or immunotherapy agents gazyva       To help prevent nausea and vomiting after your treatment, we encourage you to take your nausea medication as directed.  BELOW ARE SYMPTOMS THAT SHOULD BE REPORTED IMMEDIATELY: *FEVER GREATER THAN 100.4 F (38 C) OR HIGHER *CHILLS OR SWEATING *NAUSEA AND VOMITING THAT IS NOT CONTROLLED WITH YOUR NAUSEA MEDICATION *UNUSUAL SHORTNESS OF BREATH *UNUSUAL BRUISING OR BLEEDING *URINARY PROBLEMS (pain or burning when urinating, or frequent urination) *BOWEL PROBLEMS (unusual diarrhea, constipation, pain near the anus) TENDERNESS IN MOUTH AND THROAT WITH OR WITHOUT PRESENCE OF ULCERS (sore throat, sores in mouth, or a toothache) UNUSUAL RASH, SWELLING OR PAIN  UNUSUAL VAGINAL DISCHARGE OR ITCHING   Items with * indicate a potential emergency and should be followed up as soon as possible or go to the Emergency Department if any problems should occur.  Please show the CHEMOTHERAPY ALERT CARD or IMMUNOTHERAPY  ALERT CARD at check-in to the Emergency Department and triage nurse.  Should you have questions after your visit or need to cancel or reschedule your appointment, please contact [facility] - A DEPT OF [facility]  Dept: [phone]  and follow the prompts.  Office hours are 8:00 a.m. to 4:30 p.m. Monday - Friday. Please note that voicemails left after 4:00 p.m. may not be returned until the following business day.  We are closed weekends and major holidays. You have access to a nurse at all times for urgent questions. Please call the main number to the clinic Dept: [phone] and follow the prompts.   For any non-urgent questions, you may also contact your provider using MyChart. We now offer e-Visits for anyone 57 and older to request care online for non-urgent symptoms. For details visit [email or web address].   Also download the MyChart app! Go to the app store, search "MyChart", open the app, select [facility], and log in with your MyChart username and password.

## 2024-03-17 ENCOUNTER — Telehealth: Payer: Self-pay

## 2024-03-17 NOTE — Telephone Encounter (Signed)
 [facility] CSW Progress Note  Patient called and left vm for updates on financial aid application. CSW attempted to reach patient by phone. CSW left VM confirming application for Cancer Services was sent on 4/30. CSW provided patient with contact information to call and check status.    Maudie Sorrow, LCSW Clinical Social Worker [facility]

## 2024-03-19 ENCOUNTER — Other Ambulatory Visit: Payer: Self-pay | Admitting: Acute Care

## 2024-03-19 DIAGNOSIS — Z87891 Personal history of nicotine dependence: Secondary | ICD-10-CM

## 2024-03-19 DIAGNOSIS — Z122 Encounter for screening for malignant neoplasm of respiratory organs: Secondary | ICD-10-CM

## 2024-03-26 ENCOUNTER — Encounter: Payer: Self-pay | Admitting: Hematology and Oncology

## 2024-03-30 ENCOUNTER — Other Ambulatory Visit: Payer: Self-pay

## 2024-03-30 ENCOUNTER — Encounter (INDEPENDENT_AMBULATORY_CARE_PROVIDER_SITE_OTHER): Payer: Self-pay

## 2024-03-30 ENCOUNTER — Other Ambulatory Visit: Payer: Self-pay | Admitting: Hematology and Oncology

## 2024-03-30 DIAGNOSIS — C9110 Chronic lymphocytic leukemia of B-cell type not having achieved remission: Secondary | ICD-10-CM

## 2024-03-30 MED ORDER — VENETOCLAX 100 MG PO TABS
400.0000 mg | ORAL_TABLET | Freq: Every day | ORAL | 0 refills | Status: DC
  Filled 2024-03-30 – 2024-03-31 (×2): qty 112, 28d supply, fill #0

## 2024-03-31 ENCOUNTER — Other Ambulatory Visit (HOSPITAL_COMMUNITY): Payer: Self-pay

## 2024-03-31 ENCOUNTER — Other Ambulatory Visit: Payer: Self-pay

## 2024-03-31 ENCOUNTER — Telehealth: Payer: Self-pay

## 2024-03-31 NOTE — Progress Notes (Signed)
 Specialty Pharmacy Refill Coordination Note  MyChart Questionnaire Submission  James Parrish is a 59 y.o. male contacted today regarding refills of specialty medication(s) Venclexta .  Patient requested: (Patient-Rptd) Pickup at [facility] date: (Patient-Rptd) 04/03/24  Medication will be filled on 04/02/24.

## 2024-03-31 NOTE — Telephone Encounter (Signed)
 [facility] CSW Progress Note  Patient left CSW vm requesting status of financial assistance. CSW attempted to contact patient and spouse on this date for additional information. No answer on either phone numbers, CSW unable to leave VM. CSW will attempt to reach patient again.    Maudie Sorrow, LCSW Clinical Social Worker [facility]

## 2024-03-31 NOTE — Telephone Encounter (Signed)
 [facility] CSW Progress Note  Clinical Social Worker attempted to reach patient and spouse x2 for updates on financial assistance application through [employer]. CSW was unable to reach patient or spouse. CSW left Vm providing contact for Cancer Services.  Maudie Sorrow, LCSW Clinical Social Worker [facility]

## 2024-04-01 ENCOUNTER — Other Ambulatory Visit: Payer: Self-pay

## 2024-04-03 MED FILL — Dexamethasone Sodium Phosphate Inj 100 MG/10ML: INTRAMUSCULAR | Qty: 2 | Status: AC

## 2024-04-04 ENCOUNTER — Other Ambulatory Visit (HOSPITAL_COMMUNITY): Payer: Self-pay

## 2024-04-06 ENCOUNTER — Inpatient Hospital Stay: Attending: Hematology and Oncology

## 2024-04-06 ENCOUNTER — Inpatient Hospital Stay: Admitting: Hematology and Oncology

## 2024-04-06 ENCOUNTER — Telehealth: Payer: Self-pay

## 2024-04-06 ENCOUNTER — Inpatient Hospital Stay

## 2024-04-06 VITALS — BP 146/92 | HR 87 | Temp 97.8°F | Resp 16 | Wt 261.2 lb

## 2024-04-06 DIAGNOSIS — Z5112 Encounter for antineoplastic immunotherapy: Secondary | ICD-10-CM | POA: Diagnosis present

## 2024-04-06 DIAGNOSIS — D72820 Lymphocytosis (symptomatic): Secondary | ICD-10-CM

## 2024-04-06 DIAGNOSIS — Z803 Family history of malignant neoplasm of breast: Secondary | ICD-10-CM | POA: Insufficient documentation

## 2024-04-06 DIAGNOSIS — R5383 Other fatigue: Secondary | ICD-10-CM | POA: Diagnosis not present

## 2024-04-06 DIAGNOSIS — C9110 Chronic lymphocytic leukemia of B-cell type not having achieved remission: Secondary | ICD-10-CM

## 2024-04-06 DIAGNOSIS — Z87891 Personal history of nicotine dependence: Secondary | ICD-10-CM | POA: Insufficient documentation

## 2024-04-06 LAB — CBC WITH DIFFERENTIAL (CANCER CENTER ONLY)
Abs Immature Granulocytes: 0.02 10*3/uL (ref 0.00–0.07)
Basophils Absolute: 0 10*3/uL (ref 0.0–0.1)
Basophils Relative: 0 %
Eosinophils Absolute: 0 10*3/uL (ref 0.0–0.5)
Eosinophils Relative: 0 %
HCT: 46.1 % (ref 39.0–52.0)
Hemoglobin: 15.1 g/dL (ref 13.0–17.0)
Immature Granulocytes: 1 %
Lymphocytes Relative: 37 %
Lymphs Abs: 1.6 10*3/uL (ref 0.7–4.0)
MCH: 24.4 pg — ABNORMAL LOW (ref 26.0–34.0)
MCHC: 32.8 g/dL (ref 30.0–36.0)
MCV: 74.6 fL — ABNORMAL LOW (ref 80.0–100.0)
Monocytes Absolute: 0.3 10*3/uL (ref 0.1–1.0)
Monocytes Relative: 7 %
Neutro Abs: 2.4 10*3/uL (ref 1.7–7.7)
Neutrophils Relative %: 55 %
Platelet Count: 244 10*3/uL (ref 150–400)
RBC: 6.18 MIL/uL — ABNORMAL HIGH (ref 4.22–5.81)
RDW: 15.2 % (ref 11.5–15.5)
WBC Count: 4.3 10*3/uL (ref 4.0–10.5)
nRBC: 0 % (ref 0.0–0.2)

## 2024-04-06 LAB — CMP (CANCER CENTER ONLY)
ALT: 12 U/L (ref 0–44)
AST: 14 U/L — ABNORMAL LOW (ref 15–41)
Albumin: 4.5 g/dL (ref 3.5–5.0)
Alkaline Phosphatase: 58 U/L (ref 38–126)
Anion gap: 10 (ref 5–15)
BUN: 14 mg/dL (ref 6–20)
CO2: 24 mmol/L (ref 22–32)
Calcium: 9.5 mg/dL (ref 8.9–10.3)
Chloride: 108 mmol/L (ref 98–111)
Creatinine: 0.91 mg/dL (ref 0.61–1.24)
GFR, Estimated: >60 mL/min (ref >60)
Glucose, Bld: 153 mg/dL — ABNORMAL HIGH (ref 70–99)
Potassium: 3.5 mmol/L (ref 3.5–5.1)
Sodium: 142 mmol/L (ref 135–145)
Total Bilirubin: 0.4 mg/dL (ref 0.0–1.2)
Total Protein: 7.2 g/dL (ref 6.5–8.1)

## 2024-04-06 LAB — URIC ACID: Uric Acid, Serum: 4.6 mg/dL (ref 3.7–8.6)

## 2024-04-06 LAB — LACTATE DEHYDROGENASE: LDH: 116 U/L (ref 98–192)

## 2024-04-06 MED ORDER — SODIUM CHLORIDE 0.9 % IV SOLN: INTRAVENOUS | Status: DC

## 2024-04-06 MED ORDER — SODIUM CHLORIDE 0.9 % IV SOLN
1000.0000 mg | Freq: Once | INTRAVENOUS | Status: AC
  Administered 2024-04-06: 1000 mg via INTRAVENOUS
  Filled 2024-04-06: qty 40

## 2024-04-06 MED ORDER — SODIUM CHLORIDE 0.9 % IV SOLN
20.0000 mg | Freq: Once | INTRAVENOUS | Status: AC
  Administered 2024-04-06: 20 mg via INTRAVENOUS
  Filled 2024-04-06: qty 20

## 2024-04-06 MED ORDER — FAMOTIDINE IN NACL 20-0.9 MG/50ML-% IV SOLN
20.0000 mg | Freq: Once | INTRAVENOUS | Status: AC
  Administered 2024-04-06: 20 mg via INTRAVENOUS
  Filled 2024-04-06: qty 50

## 2024-04-06 MED ORDER — ACETAMINOPHEN 325 MG PO TABS
650.0000 mg | ORAL_TABLET | Freq: Once | ORAL | Status: AC
Start: 2024-04-06 — End: 2024-04-06
  Administered 2024-04-06: 650 mg via ORAL
  Filled 2024-04-06: qty 2

## 2024-04-06 MED ORDER — DIPHENHYDRAMINE HCL 50 MG/ML IJ SOLN
50.0000 mg | Freq: Once | INTRAMUSCULAR | Status: AC
  Administered 2024-04-06: 50 mg via INTRAVENOUS
  Filled 2024-04-06: qty 1

## 2024-04-06 NOTE — Patient Instructions (Signed)
 [facility] - A DEPT OF [facility]  Discharge Instructions: Thank you for choosing [facility] to provide your oncology and hematology care.   If you have a lab appointment with the Cancer Center, please go directly to the Cancer Center and check in at the registration area.   Wear comfortable clothing and clothing appropriate for easy access to any Portacath or PICC line.   We strive to give you quality time with your provider. You may need to reschedule your appointment if you arrive late (15 or more minutes).  Arriving late affects you and other patients whose appointments are after yours.  Also, if you miss three or more appointments without notifying the office, you may be dismissed from the clinic at the provider's discretion.      For prescription refill requests, have your pharmacy contact our office and allow 72 hours for refills to be completed.    Today you received the following chemotherapy and/or immunotherapy agents gazyva       To help prevent nausea and vomiting after your treatment, we encourage you to take your nausea medication as directed.  BELOW ARE SYMPTOMS THAT SHOULD BE REPORTED IMMEDIATELY: *FEVER GREATER THAN 100.4 F (38 C) OR HIGHER *CHILLS OR SWEATING *NAUSEA AND VOMITING THAT IS NOT CONTROLLED WITH YOUR NAUSEA MEDICATION *UNUSUAL SHORTNESS OF BREATH *UNUSUAL BRUISING OR BLEEDING *URINARY PROBLEMS (pain or burning when urinating, or frequent urination) *BOWEL PROBLEMS (unusual diarrhea, constipation, pain near the anus) TENDERNESS IN MOUTH AND THROAT WITH OR WITHOUT PRESENCE OF ULCERS (sore throat, sores in mouth, or a toothache) UNUSUAL RASH, SWELLING OR PAIN  UNUSUAL VAGINAL DISCHARGE OR ITCHING   Items with * indicate a potential emergency and should be followed up as soon as possible or go to the Emergency Department if any problems should occur.  Please show the CHEMOTHERAPY ALERT CARD or IMMUNOTHERAPY  ALERT CARD at check-in to the Emergency Department and triage nurse.  Should you have questions after your visit or need to cancel or reschedule your appointment, please contact [facility] - A DEPT OF [facility]  Dept: [phone]  and follow the prompts.  Office hours are 8:00 a.m. to 4:30 p.m. Monday - Friday. Please note that voicemails left after 4:00 p.m. may not be returned until the following business day.  We are closed weekends and major holidays. You have access to a nurse at all times for urgent questions. Please call the main number to the clinic Dept: [phone] and follow the prompts.   For any non-urgent questions, you may also contact your provider using MyChart. We now offer e-Visits for anyone 57 and older to request care online for non-urgent symptoms. For details visit [email or web address].   Also download the MyChart app! Go to the app store, search "MyChart", open the app, select [facility], and log in with your MyChart username and password.

## 2024-04-06 NOTE — Telephone Encounter (Signed)
 [facility] CSW Progress Note  Clinical Child psychotherapist contacted patient by phone to follow-up on LLS application through [employer]. CSW confirmed medical documentation has been submitted. Patient denied any other needs or concerns at this time.    Lizbeth Sprague, LCSW Clinical Social Worker [facility]

## 2024-04-06 NOTE — Progress Notes (Signed)
 [facility] [phone]   [phone]  PROGRESS NOTE  Patient Care Team: Joshua Debby CROME, MD as PCP - General (Internal Medicine) Shlomo Wilbert SAUNDERS, MD as PCP - Sleep Medicine (Cardiology)  Hematological/Oncological History # CLL, Rai Stage 0. Del 11 01/25/2020: WBC 9.6, Hgb 15.6, MCV 79.1, Plt 241 05/29/2021: WBC 13.3, Hgb 15.4, MCV 76.9, Plt 203 11/16/2021: patient had routine CBC collected, showed WBC 15.9, Hgb 14.8, MCV 76.4, ALC 11.6 12/14/2021: establish care with Dr. Federico  05/24/2023: WBC 43.2, Hgb 14.6, MCV 77.8, Plt 217 11/25/2023: WBC 95.9, Hgb 15.4, MCV 77.8, Plt 221  01/13/2024: start of obintuzumab therapy. Cycle 1 Day 1  02/10/2024: Cycle 2 Day 1 of obinutuzumab . Start of venetoclax  03/09/2024: Cycle 3 Day 1 of obinutuzumab /Venetoclax  04/06/2024: Cycle 3 Day 1 of obinutuzumab /Venetoclax    Interval History:  James Parrish 58 y.o. male with medical history significant for CLL who presents for a follow up visit. The patient's last visit was on 03/11/2023. In the interim, he denies any changes to his health.   On exam today James Parrish reports he has been well overall in the interim since her last visit and is tolerating his treatment well.  He reports that he does not have great endurance but overall his energy levels are good.  His appetite has decreased but his weight has been steadily increasing.  He is tolerating the venetoclax  pills well but no nausea, vomiting, or diarrhea.  He is not having any fevers, chills, sweats is not currently having any infectious symptoms.  Overall he feels well and is willing and able to continue treatment at this time.  A full 10 point ROS is otherwise negative.  MEDICAL HISTORY:  Past Medical History:  Diagnosis Date   CLL (chronic lymphocytic leukemia) (HCC)    Complication of anesthesia    hard to wake up    Diabetes mellitus without complication [facility])    ED (erectile dysfunction)    History of COVID-19 04/2019    Hyperlipidemia    Hypertension    Sleep apnea    uses CPAP nightly    SURGICAL HISTORY: Past Surgical History:  Procedure Laterality Date   BRONCHIAL NEEDLE ASPIRATION BIOPSY  05/29/2021   Procedure: BRONCHIAL NEEDLE ASPIRATION BIOPSIES;  Surgeon: Brenna Adine CROME, DO;  Location: [facility];  Service: Pulmonary;;   COLONOSCOPY     finger surgery Right    Index   KNEE SURGERY Left    KNEE SURGERY Right    ORIF PATELLA Right    REVERSE SHOULDER ARTHROPLASTY Right 08/31/2020   Procedure: REVERSE SHOULDER ARTHROPLASTY;  Surgeon: Cristy Bonner DASEN, MD;  Location: [facility];  Service: Orthopedics;  Laterality: Right;   SHOULDER ARTHROSCOPY WITH ROTATOR CUFF REPAIR Left 02/04/2020   Procedure: SHOULDER ARTHROSCOPY WITH ROTATOR CUFF REPAIR WITH BICEP TENODESIS;  Surgeon: Cristy Bonner DASEN, MD;  Location: [facility];  Service: Orthopedics;  Laterality: Left;   VIDEO BRONCHOSCOPY WITH ENDOBRONCHIAL ULTRASOUND Left 05/29/2021   Procedure: VIDEO BRONCHOSCOPY WITH ENDOBRONCHIAL ULTRASOUND;  Surgeon: Brenna Adine CROME, DO;  Location: [facility];  Service: Pulmonary;  Laterality: Left;    SOCIAL HISTORY: Social History   Socioeconomic History   Marital status: Married    Spouse name: Not on file   Number of children: Not on file   Years of education: Not on file   Highest education level: 12th grade  Occupational History   Not on file  Tobacco Use   Smoking status: Former  Current packs/day: 0.00    Average packs/day: 1.5 packs/day for 25.0 years (37.5 ttl pk-yrs)    Types: Cigarettes    Start date: 04/1995    Quit date: 04/2020    Years since quitting: 3.9    Passive exposure: Past   Smokeless tobacco: Never  Vaping Use   Vaping status: Never Used  Substance and Sexual Activity   Alcohol use: Not Currently    Comment: occasionally   Drug use: Never   Sexual activity: Yes    Partners: Female  Other Topics Concern   Not on file  Social History  Narrative   Not on file   Social Drivers of Health   Financial Resource Strain: High Risk (12/28/2023)   Overall Financial Resource Strain (CARDIA)    Difficulty of Paying Living Expenses: Very hard  Food Insecurity: Food Insecurity Present (12/28/2023)   Hunger Vital Sign    Worried About Running Out of Food in the Last Year: Often true    Ran Out of Food in the Last Year: Often true  Transportation Needs: No Transportation Needs (12/28/2023)   PRAPARE - Administrator, Civil Service (Medical): No    Lack of Transportation (Non-Medical): No  Physical Activity: Insufficiently Active (12/28/2023)   Exercise Vital Sign    Days of Exercise per Week: 3 days    Minutes of Exercise per Session: 10 min  Stress: Stress Concern Present (12/28/2023)   [employer] of Occupational Health - Occupational Stress Questionnaire    Feeling of Stress : To some extent  Social Connections: Socially Isolated (12/28/2023)   Social Connection and Isolation Panel    Frequency of Communication with Friends and Family: Twice a week    Frequency of Social Gatherings with Friends and Family: Never    Attends Religious Services: Never    Diplomatic Services operational officer: No    Attends Engineer, structural: Not on file    Marital Status: Married  Catering manager Violence: Not on file    FAMILY HISTORY: Family History  Problem Relation Age of Onset   Cancer Mother        breast cancer    Hypertension Mother    Diabetes Mother    Venous thrombosis Son    Colon cancer Neg Hx    Esophageal cancer Neg Hx    Stomach cancer Neg Hx    Rectal cancer Neg Hx     ALLERGIES:  is allergic to gazyva  [obinutuzumab ].  MEDICATIONS:  Current Outpatient Medications  Medication Sig Dispense Refill   allopurinol  (ZYLOPRIM ) 300 MG tablet Take 1 tablet (300 mg total) by mouth daily. 30 tablet 3   amLODipine  (NORVASC ) 10 MG tablet Take 1 tablet (10 mg total) by mouth at bedtime. 90 tablet 0    atorvastatin  (LIPITOR) 10 MG tablet TAKE 1 TABLET BY MOUTH DAILY 30 tablet 3   dapagliflozin  propanediol (FARXIGA ) 10 MG TABS tablet Take 1 tablet (10 mg total) by mouth daily before breakfast. 90 tablet 3   diclofenac (VOLTAREN) 75 MG EC tablet Take 75 mg by mouth 2 (two) times daily.     glucose blood test strip Use as instructed to test blood sugar 2 times daily E11.65    Onetouch strips 100 each 12   indapamide  (LOZOL ) 1.25 MG tablet TAKE 1 TABLET BY MOUTH DAILY 90 tablet 0   insulin  glargine (LANTUS  SOLOSTAR) 100 UNIT/ML Solostar Pen Inject 30 Units into the skin daily. 30 mL 6   Insulin   Pen Needle 29G X MISC 1 Device by Does not apply route daily in the afternoon. 100 each 3   losartan  (COZAAR ) 100 MG tablet Take 1 tablet (100 mg total) by mouth daily. 90 tablet 0   metFORMIN  (GLUCOPHAGE ) 1000 MG tablet Take 1 tablet (1,000 mg total) by mouth 2 (two) times daily with a meal. 180 tablet 3   Multiple Vitamin (MULTIVITAMIN WITH MINERALS) TABS tablet Take 1 tablet by mouth daily.     nebivolol  (BYSTOLIC ) 10 MG tablet Take 1 tablet (10 mg total) by mouth daily. 90 tablet 0   ondansetron  (ZOFRAN ) 8 MG tablet Take 1 tablet (8 mg total) by mouth every 8 (eight) hours as needed for nausea or vomiting. 20 tablet 2   oseltamivir  (TAMIFLU ) 75 MG capsule Take 1 capsule (75 mg total) by mouth 2 (two) times daily. 10 capsule 0   prochlorperazine  (COMPAZINE ) 10 MG tablet Take 1 tablet (10 mg total) by mouth every 6 (six) hours as needed for nausea or vomiting. 30 tablet 2   promethazine -dextromethorphan (PROMETHAZINE -DM) 6.25-15 MG/5ML syrup Take 5 mLs by mouth 3 (three) times daily as needed for cough. 200 mL 0   Semaglutide , 2 MG/DOSE, (OZEMPIC , 2 MG/DOSE,) 8 MG/3ML SOPN DIAL AND INJECT UNDER THE SKIN 2 MG WEEKLY 3 mL 1   spironolactone  (ALDACTONE ) 50 MG tablet TAKE 1 TABLET BY MOUTH DAILY 90 tablet 0   tadalafil  (CIALIS ) 20 MG tablet TAKE 1 TABLET BY MOUTH DAILY AS NEEDED FOR ERECTILE DYSFUNCTION 10  tablet 2   venetoclax  (VENCLEXTA ) 100 MG tablet Take 4 tablets (400 mg total) by mouth daily. Tablets should be swallowed whole with a meal and a full glass of water. 112 tablet 0   No current facility-administered medications for this visit.   Facility-Administered Medications Ordered in Other Visits  Medication Dose Route Frequency Provider Last Rate Last Admin   0.9 %  sodium chloride  infusion   Intravenous Continuous Federico Norleen ONEIDA MADISON, MD 10 mL/hr at 04/06/24 1228 New Bag at 04/06/24 1228    REVIEW OF SYSTEMS:   Constitutional: ( - ) fevers, ( - )  chills , ( - ) night sweats Eyes: ( - ) blurriness of vision, ( - ) double vision, ( - ) watery eyes Ears, nose, mouth, throat, and face: ( - ) mucositis, ( - ) sore throat Respiratory: ( - ) cough, ( - ) dyspnea, ( - ) wheezes Cardiovascular: ( - ) palpitation, ( - ) chest discomfort, ( - ) lower extremity swelling Gastrointestinal:  ( - ) nausea, ( - ) heartburn, ( - ) change in bowel habits Skin: ( - ) abnormal skin rashes Lymphatics: ( - ) new lymphadenopathy, ( - ) easy bruising Neurological: ( - ) numbness, ( - ) tingling, ( - ) new weaknesses Behavioral/Psych: ( - ) mood change, ( - ) new changes  All other systems were reviewed with the patient and are negative.  PHYSICAL EXAMINATION: ECOG PERFORMANCE STATUS: 0 - Asymptomatic  There were no vitals filed for this visit.      There were no vitals filed for this visit.       GENERAL: Well-appearing middle-aged African-American male alert, no distress and comfortable SKIN: skin color, texture, turgor are normal, no rashes or significant lesions EYES: conjunctiva are pink and non-injected, sclera clear NECK: supple, non-tender LYMPH: Mildly prominent left-sided cervical lymph nodes, no other overt lymphadenopathy noted on exam LUNGS: clear to auscultation and percussion with normal breathing effort  HEART: regular rate & rhythm and no murmurs and no lower extremity  edema Musculoskeletal: no cyanosis of digits and no clubbing  PSYCH: alert & oriented x 3, fluent speech NEURO: no focal motor/sensory deficits  LABORATORY DATA:  I have reviewed the data as listed    Latest Ref Rng & Units 04/06/2024   11:08 AM 03/10/2024   11:06 AM 02/24/2024    9:18 AM  CBC  WBC 4.0 - 10.5 K/uL 4.3  5.1  6.2   Hemoglobin 13.0 - 17.0 g/dL 84.8  84.7  84.7   Hematocrit 39.0 - 52.0 % 46.1  46.2  46.4   Platelets 150 - 400 K/uL 244  203  189        Latest Ref Rng & Units 04/06/2024   11:08 AM 03/10/2024   11:06 AM 02/24/2024    9:18 AM  CMP  Glucose 70 - 99 mg/dL 846  868  880   BUN 6 - 20 mg/dL 14  16  18    Creatinine 0.61 - 1.24 mg/dL 9.08  9.07  9.00   Sodium 135 - 145 mmol/L 142  139  139   Potassium 3.5 - 5.1 mmol/L 3.5  3.8  3.9   Chloride 98 - 111 mmol/L 108  106  107   CO2 22 - 32 mmol/L 24  22  24    Calcium  8.9 - 10.3 mg/dL 9.5  9.6  9.5   Total Protein 6.5 - 8.1 g/dL 7.2  7.1  7.1   Total Bilirubin 0.0 - 1.2 mg/dL 0.4  0.3  0.4   Alkaline Phos 38 - 126 U/L 58  55  56   AST 15 - 41 U/L 14  16  16    ALT 0 - 44 U/L 12  14  15      RADIOGRAPHIC STUDIES:  No results found.  ASSESSMENT & PLAN James Parrish 58 y.o. male with medical history significant for CLL who presents for a follow up visit.  # CLL, Rai Stage 0 --Due to rapidly rising white blood cell count patient was started on obinutuzumab /venetoclax .  Cycle 1 day 1 was on 01/13/2024. Obinutuzmab to continue x 6 months.  --Patient has started therapy and is tolerating it well. --Labs show white blood cell count 4.3, hemoglobin 15.1, MCV 74.6, platelets 244 -- Venetoclax  added with cycle 2. Plan to continue x 12 months.  --RTC for Cycle 5 Day 1 with continued interval venetoclax .   No orders of the defined types were placed in this encounter.   All questions were answered. The patient knows to call the clinic with any problems, questions or concerns.  I have spent a total of 30 minutes  minutes of face-to-face and non-face-to-face time, preparing to see the patient,  performing a medically appropriate examination, counseling and educating the patient, documenting clinical information in the electronic health record, and care coordination.   Norleen IVAR Kidney, MD Department of Hematology/Oncology [facility] Phone: [phone] Pager: [phone] Email: [email or web address]   04/06/2024 2:31 PM  Shermon CHRISTELLA Hearing BD, Catovsky D, Caligaris-Cappio F, Dighiero G, Dhner H, Hillmen P, Keating M, Montserrat E, Chiorazzi N, Stilgenbauer S, Rai KR, [place], Eichhorst B, O'Brien S, Robak T, Seymour JF, Kipps TJ. iwCLL guidelines for diagnosis, indications for treatment, response assessment, and supportive management of CLL. Blood. 2018 Jun 21;131(25):2745-2760.  Active disease should be clearly documented to initiate therapy. At least 1 of the following criteria should be met.  1)  Evidence of progressive marrow failure as manifested by the development of, or worsening of, anemia and/or thrombocytopenia. Cutoff levels of Hb <10 g/dL or platelet counts <899  109/L are generally regarded as indication for treatment. However, in some patients, platelet counts <100  109/L may remain stable over a long period; this situation does not automatically require therapeutic intervention. 2) Massive (ie, >=6 cm below the left costal margin) or progressive or symptomatic splenomegaly. 3) Massive nodes (ie, >=10 cm in longest diameter) or progressive or symptomatic lymphadenopathy. 4) Progressive lymphocytosis with an increase of >=50% over a 70-month period, or lymphocyte doubling time (LDT) <6 months. LDT can be obtained by linear regression extrapolation of absolute lymphocyte counts obtained at intervals of 2 weeks over an observation period of 2 to 3 months; patients with initial blood lymphocyte counts <30  109/L may require a longer observation period to  determine the LDT. Factors contributing to lymphocytosis other than CLL (eg, infections, steroid administration) should be excluded. 5) Autoimmune complications including anemia or thrombocytopenia poorly responsive to corticosteroids. 6) Symptomatic or functional extranodal involvement (eg, skin, kidney, lung, spine). Disease-related symptoms as defined by any of the following: Unintentional weight loss >=10% within the previous 6 months. Significant fatigue (ie, ECOG performance scale 2 or worse; cannot work or unable to perform usual activities). Fevers >=100.73F or 38.0C for 2 or more weeks without evidence of infection. Night sweats for >=1 month without evidence of infection.

## 2024-04-25 ENCOUNTER — Other Ambulatory Visit: Payer: Self-pay | Admitting: Internal Medicine

## 2024-04-25 DIAGNOSIS — N5201 Erectile dysfunction due to arterial insufficiency: Secondary | ICD-10-CM

## 2024-04-29 ENCOUNTER — Other Ambulatory Visit: Payer: Self-pay | Admitting: Hematology and Oncology

## 2024-04-29 ENCOUNTER — Other Ambulatory Visit: Payer: Self-pay

## 2024-04-29 DIAGNOSIS — C9110 Chronic lymphocytic leukemia of B-cell type not having achieved remission: Secondary | ICD-10-CM

## 2024-04-30 ENCOUNTER — Other Ambulatory Visit: Payer: Self-pay

## 2024-04-30 ENCOUNTER — Other Ambulatory Visit (HOSPITAL_COMMUNITY): Payer: Self-pay

## 2024-04-30 NOTE — Progress Notes (Signed)
 Specialty Pharmacy Refill Coordination Note  James Parrish is a 58 y.o. male contacted today regarding refills of specialty medication(s) Venetoclax  (VENCLEXTA )   Patient requested [name] at [facility] [place] date: 05/04/24   Medication will be filled on 05/01/24.   This fill date is pending response to refill request from provider. Patient is aware and if they have not received fill by intended date they must follow up with pharmacy.

## 2024-05-01 ENCOUNTER — Other Ambulatory Visit: Payer: Self-pay

## 2024-05-01 ENCOUNTER — Other Ambulatory Visit (HOSPITAL_COMMUNITY): Payer: Self-pay

## 2024-05-01 MED ORDER — VENETOCLAX 100 MG PO TABS
400.0000 mg | ORAL_TABLET | Freq: Every day | ORAL | 0 refills | Status: DC
  Filled 2024-05-01: qty 112, 28d supply, fill #0

## 2024-05-01 MED FILL — Dexamethasone Sodium Phosphate Inj 100 MG/10ML: INTRAMUSCULAR | Qty: 2 | Status: AC

## 2024-05-04 ENCOUNTER — Inpatient Hospital Stay

## 2024-05-04 ENCOUNTER — Inpatient Hospital Stay: Attending: Hematology and Oncology | Admitting: Hematology and Oncology

## 2024-05-04 ENCOUNTER — Inpatient Hospital Stay: Attending: Hematology and Oncology

## 2024-05-04 VITALS — BP 144/99 | HR 90 | Temp 98.4°F | Resp 18 | Ht 74.0 in | Wt 264.4 lb

## 2024-05-04 VITALS — BP 138/84 | HR 95 | Temp 97.6°F | Resp 16

## 2024-05-04 DIAGNOSIS — Z803 Family history of malignant neoplasm of breast: Secondary | ICD-10-CM | POA: Insufficient documentation

## 2024-05-04 DIAGNOSIS — D72820 Lymphocytosis (symptomatic): Secondary | ICD-10-CM

## 2024-05-04 DIAGNOSIS — R5383 Other fatigue: Secondary | ICD-10-CM | POA: Insufficient documentation

## 2024-05-04 DIAGNOSIS — Z87891 Personal history of nicotine dependence: Secondary | ICD-10-CM | POA: Insufficient documentation

## 2024-05-04 DIAGNOSIS — C9110 Chronic lymphocytic leukemia of B-cell type not having achieved remission: Secondary | ICD-10-CM | POA: Diagnosis not present

## 2024-05-04 DIAGNOSIS — Z5112 Encounter for antineoplastic immunotherapy: Secondary | ICD-10-CM | POA: Diagnosis present

## 2024-05-04 LAB — CMP (CANCER CENTER ONLY)
ALT: 14 U/L (ref 0–44)
AST: 15 U/L (ref 15–41)
Albumin: 4.5 g/dL (ref 3.5–5.0)
Alkaline Phosphatase: 55 U/L (ref 38–126)
Anion gap: 8 (ref 5–15)
BUN: 13 mg/dL (ref 6–20)
CO2: 23 mmol/L (ref 22–32)
Calcium: 9.1 mg/dL (ref 8.9–10.3)
Chloride: 110 mmol/L (ref 98–111)
Creatinine: 0.94 mg/dL (ref 0.61–1.24)
GFR, Estimated: >60 mL/min (ref >60)
Glucose, Bld: 126 mg/dL — ABNORMAL HIGH (ref 70–99)
Potassium: 3.7 mmol/L (ref 3.5–5.1)
Sodium: 141 mmol/L (ref 135–145)
Total Bilirubin: 0.5 mg/dL (ref 0.0–1.2)
Total Protein: 7.1 g/dL (ref 6.5–8.1)

## 2024-05-04 LAB — CBC WITH DIFFERENTIAL (CANCER CENTER ONLY)
Abs Immature Granulocytes: 0.07 K/uL (ref 0.00–0.07)
Basophils Absolute: 0 K/uL (ref 0.0–0.1)
Basophils Relative: 0 %
Eosinophils Absolute: 0 K/uL (ref 0.0–0.5)
Eosinophils Relative: 0 %
HCT: 46.9 % (ref 39.0–52.0)
Hemoglobin: 15.5 g/dL (ref 13.0–17.0)
Immature Granulocytes: 1 %
Lymphocytes Relative: 37 %
Lymphs Abs: 2.2 K/uL (ref 0.7–4.0)
MCH: 24.6 pg — ABNORMAL LOW (ref 26.0–34.0)
MCHC: 33 g/dL (ref 30.0–36.0)
MCV: 74.3 fL — ABNORMAL LOW (ref 80.0–100.0)
Monocytes Absolute: 0.8 K/uL (ref 0.1–1.0)
Monocytes Relative: 13 %
Neutro Abs: 2.9 K/uL (ref 1.7–7.7)
Neutrophils Relative %: 49 %
Platelet Count: 157 K/uL (ref 150–400)
RBC: 6.31 MIL/uL — ABNORMAL HIGH (ref 4.22–5.81)
RDW: 15.2 % (ref 11.5–15.5)
WBC Count: 5.9 K/uL (ref 4.0–10.5)
nRBC: 0 % (ref 0.0–0.2)

## 2024-05-04 LAB — URIC ACID: Uric Acid, Serum: 3.8 mg/dL (ref 3.7–8.6)

## 2024-05-04 LAB — LACTATE DEHYDROGENASE: LDH: 137 U/L (ref 98–192)

## 2024-05-04 MED ORDER — SODIUM CHLORIDE 0.9 % IV SOLN: INTRAVENOUS | Status: DC

## 2024-05-04 MED ORDER — SODIUM CHLORIDE 0.9 % IV SOLN
20.0000 mg | Freq: Once | INTRAVENOUS | Status: AC
  Administered 2024-05-04: 20 mg via INTRAVENOUS
  Filled 2024-05-04: qty 20

## 2024-05-04 MED ORDER — FAMOTIDINE IN NACL 20-0.9 MG/50ML-% IV SOLN
20.0000 mg | Freq: Once | INTRAVENOUS | Status: AC
  Administered 2024-05-04: 20 mg via INTRAVENOUS
  Filled 2024-05-04: qty 50

## 2024-05-04 MED ORDER — ACETAMINOPHEN 325 MG PO TABS
650.0000 mg | ORAL_TABLET | Freq: Once | ORAL | Status: AC
Start: 2024-05-04 — End: 2024-05-04
  Administered 2024-05-04: 650 mg via ORAL
  Filled 2024-05-04: qty 2

## 2024-05-04 MED ORDER — SODIUM CHLORIDE 0.9 % IV SOLN
1000.0000 mg | Freq: Once | INTRAVENOUS | Status: AC
  Administered 2024-05-04: 1000 mg via INTRAVENOUS
  Filled 2024-05-04: qty 40

## 2024-05-04 MED ORDER — DIPHENHYDRAMINE HCL 50 MG/ML IJ SOLN
50.0000 mg | Freq: Once | INTRAMUSCULAR | Status: AC
  Administered 2024-05-04: 50 mg via INTRAVENOUS
  Filled 2024-05-04: qty 1

## 2024-05-04 NOTE — Progress Notes (Unsigned)
 [facility] [phone]   [phone]  PROGRESS NOTE  Patient Care Team: Joshua Debby CROME, MD as PCP - General (Internal Medicine) Shlomo Wilbert SAUNDERS, MD as PCP - Sleep Medicine (Cardiology)  Hematological/Oncological History # CLL, Rai Stage 0. Del 11 01/25/2020: WBC 9.6, Hgb 15.6, MCV 79.1, Plt 241 05/29/2021: WBC 13.3, Hgb 15.4, MCV 76.9, Plt 203 11/16/2021: patient had routine CBC collected, showed WBC 15.9, Hgb 14.8, MCV 76.4, ALC 11.6 12/14/2021: establish care with Dr. Federico  05/24/2023: WBC 43.2, Hgb 14.6, MCV 77.8, Plt 217 11/25/2023: WBC 95.9, Hgb 15.4, MCV 77.8, Plt 221  01/13/2024: start of obintuzumab therapy. Cycle 1 Day 1  02/10/2024: Cycle 2 Day 1 of obinutuzumab . Start of venetoclax  03/09/2024: Cycle 3 Day 1 of obinutuzumab /Venetoclax  04/06/2024: Cycle 4 Day 1 of obinutuzumab /Venetoclax  05/04/2024: Cycle 5 Day 1 of obinutuzumab /Venetoclax    Interval History:  James Parrish 58 y.o. male with medical history significant for CLL who presents for a follow up visit. The patient's last visit was on 04/06/2024.  In the interim, he denies any changes to his health.   On exam today James Parrish reports he has been not bad in the interim since our last visit.  He presents today for cycle #5.  He reports that he is tolerating it well with no major side effects.  He does occasionally get some shortness of breath when doing yard work or walking up the stairs.  He did have nosebleeds a few weeks ago but not particularly heavy.  He reports it occurred after he got out of the shower.  His weight has increased by 5 pounds in the interim since April.  He reports that he is tolerating venetoclax  pills well with no major side effects there.  He reports he is not having any nausea, vomiting, or diarrhea.  He did also recently start a CPAP machine and has given him a lot of energy back.  He reports that he also recently celebrated his 24th anniversary with his wife.  They  went to the casino in [place].  A full 10 point ROS is otherwise negative.  MEDICAL HISTORY:  Past Medical History:  Diagnosis Date   CLL (chronic lymphocytic leukemia) (HCC)    Complication of anesthesia    hard to wake up    Diabetes mellitus without complication [facility])    ED (erectile dysfunction)    History of COVID-19 04/2019   Hyperlipidemia    Hypertension    Sleep apnea    uses CPAP nightly    SURGICAL HISTORY: Past Surgical History:  Procedure Laterality Date   BRONCHIAL NEEDLE ASPIRATION BIOPSY  05/29/2021   Procedure: BRONCHIAL NEEDLE ASPIRATION BIOPSIES;  Surgeon: Brenna Adine CROME, DO;  Location: [facility];  Service: Pulmonary;;   COLONOSCOPY     finger surgery Right    Index   KNEE SURGERY Left    KNEE SURGERY Right    ORIF PATELLA Right    REVERSE SHOULDER ARTHROPLASTY Right 08/31/2020   Procedure: REVERSE SHOULDER ARTHROPLASTY;  Surgeon: Cristy Bonner DASEN, MD;  Location: [facility];  Service: Orthopedics;  Laterality: Right;   SHOULDER ARTHROSCOPY WITH ROTATOR CUFF REPAIR Left 02/04/2020   Procedure: SHOULDER ARTHROSCOPY WITH ROTATOR CUFF REPAIR WITH BICEP TENODESIS;  Surgeon: Cristy Bonner DASEN, MD;  Location: [facility];  Service: Orthopedics;  Laterality: Left;   VIDEO BRONCHOSCOPY WITH ENDOBRONCHIAL ULTRASOUND Left 05/29/2021   Procedure: VIDEO BRONCHOSCOPY WITH ENDOBRONCHIAL ULTRASOUND;  Surgeon: Brenna Adine CROME, DO;  Location: [facility];  Service: Pulmonary;  Laterality: Left;    SOCIAL HISTORY: Social History   Socioeconomic History   Marital status: Married    Spouse name: Not on file   Number of children: Not on file   Years of education: Not on file   Highest education level: 12th grade  Occupational History   Not on file  Tobacco Use   Smoking status: Former    Current packs/day: 0.00    Average packs/day: 1.5 packs/day for 25.0 years (37.5 ttl pk-yrs)    Types: Cigarettes    Start date: 04/1995    Quit date:  04/2020    Years since quitting: 4.0    Passive exposure: Past   Smokeless tobacco: Never  Vaping Use   Vaping status: Never Used  Substance and Sexual Activity   Alcohol use: Not Currently    Comment: occasionally   Drug use: Never   Sexual activity: Yes    Partners: Female  Other Topics Concern   Not on file  Social History Narrative   Not on file   Social Drivers of Health   Financial Resource Strain: High Risk (12/28/2023)   Overall Financial Resource Strain (CARDIA)    Difficulty of Paying Living Expenses: Very hard  Food Insecurity: Food Insecurity Present (12/28/2023)   Hunger Vital Sign    Worried About Running Out of Food in the Last Year: Often true    Ran Out of Food in the Last Year: Often true  Transportation Needs: No Transportation Needs (12/28/2023)   PRAPARE - Administrator, Civil Service (Medical): No    Lack of Transportation (Non-Medical): No  Physical Activity: Insufficiently Active (12/28/2023)   Exercise Vital Sign    Days of Exercise per Week: 3 days    Minutes of Exercise per Session: 10 min  Stress: Stress Concern Present (12/28/2023)   [employer] of Occupational Health - Occupational Stress Questionnaire    Feeling of Stress : To some extent  Social Connections: Socially Isolated (12/28/2023)   Social Connection and Isolation Panel    Frequency of Communication with Friends and Family: Twice a week    Frequency of Social Gatherings with Friends and Family: Never    Attends Religious Services: Never    Diplomatic Services operational officer: No    Attends Engineer, structural: Not on file    Marital Status: Married  Catering manager Violence: Not on file    FAMILY HISTORY: Family History  Problem Relation Age of Onset   Cancer Mother        breast cancer    Hypertension Mother    Diabetes Mother    Venous thrombosis Son    Colon cancer Neg Hx    Esophageal cancer Neg Hx    Stomach cancer Neg Hx    Rectal  cancer Neg Hx     ALLERGIES:  is allergic to gazyva  [obinutuzumab ].  MEDICATIONS:  Current Outpatient Medications  Medication Sig Dispense Refill   allopurinol  (ZYLOPRIM ) 300 MG tablet Take 1 tablet (300 mg total) by mouth daily. 30 tablet 3   amLODipine  (NORVASC ) 10 MG tablet Take 1 tablet (10 mg total) by mouth at bedtime. 90 tablet 0   atorvastatin  (LIPITOR) 10 MG tablet TAKE 1 TABLET BY MOUTH DAILY 30 tablet 3   dapagliflozin  propanediol (FARXIGA ) 10 MG TABS tablet Take 1 tablet (10 mg total) by mouth daily before breakfast. 90 tablet 3   diclofenac (VOLTAREN) 75 MG EC tablet  Take 75 mg by mouth 2 (two) times daily.     glucose blood test strip Use as instructed to test blood sugar 2 times daily E11.65    Onetouch strips 100 each 12   indapamide  (LOZOL ) 1.25 MG tablet TAKE 1 TABLET BY MOUTH DAILY 90 tablet 0   insulin  glargine (LANTUS  SOLOSTAR) 100 UNIT/ML Solostar Pen Inject 30 Units into the skin daily. 30 mL 6   Insulin  Pen Needle 29G X MISC 1 Device by Does not apply route daily in the afternoon. 100 each 3   losartan  (COZAAR ) 100 MG tablet Take 1 tablet (100 mg total) by mouth daily. 90 tablet 0   metFORMIN  (GLUCOPHAGE ) 1000 MG tablet Take 1 tablet (1,000 mg total) by mouth 2 (two) times daily with a meal. 180 tablet 3   Multiple Vitamin (MULTIVITAMIN WITH MINERALS) TABS tablet Take 1 tablet by mouth daily.     nebivolol  (BYSTOLIC ) 10 MG tablet Take 1 tablet (10 mg total) by mouth daily. 90 tablet 0   ondansetron  (ZOFRAN ) 8 MG tablet Take 1 tablet (8 mg total) by mouth every 8 (eight) hours as needed for nausea or vomiting. 20 tablet 2   oseltamivir  (TAMIFLU ) 75 MG capsule Take 1 capsule (75 mg total) by mouth 2 (two) times daily. 10 capsule 0   prochlorperazine  (COMPAZINE ) 10 MG tablet Take 1 tablet (10 mg total) by mouth every 6 (six) hours as needed for nausea or vomiting. 30 tablet 2   promethazine -dextromethorphan (PROMETHAZINE -DM) 6.25-15 MG/5ML syrup Take 5 mLs by mouth 3  (three) times daily as needed for cough. 200 mL 0   Semaglutide , 2 MG/DOSE, (OZEMPIC , 2 MG/DOSE,) 8 MG/3ML SOPN DIAL AND INJECT UNDER THE SKIN 2 MG WEEKLY 3 mL 1   spironolactone  (ALDACTONE ) 50 MG tablet TAKE 1 TABLET BY MOUTH DAILY 90 tablet 0   tadalafil  (CIALIS ) 20 MG tablet TAKE 1 TABLET BY MOUTH DAILY AS NEEDED FOR ERECTILE DYSFUNCTION 10 tablet 2   venetoclax  (VENCLEXTA ) 100 MG tablet Take 4 tablets (400 mg total) by mouth daily. Tablets should be swallowed whole with a meal and a full glass of water. 112 tablet 0   No current facility-administered medications for this visit.    REVIEW OF SYSTEMS:   Constitutional: ( - ) fevers, ( - )  chills , ( - ) night sweats Eyes: ( - ) blurriness of vision, ( - ) double vision, ( - ) watery eyes Ears, nose, mouth, throat, and face: ( - ) mucositis, ( - ) sore throat Respiratory: ( - ) cough, ( - ) dyspnea, ( - ) wheezes Cardiovascular: ( - ) palpitation, ( - ) chest discomfort, ( - ) lower extremity swelling Gastrointestinal:  ( - ) nausea, ( - ) heartburn, ( - ) change in bowel habits Skin: ( - ) abnormal skin rashes Lymphatics: ( - ) new lymphadenopathy, ( - ) easy bruising Neurological: ( - ) numbness, ( - ) tingling, ( - ) new weaknesses Behavioral/Psych: ( - ) mood change, ( - ) new changes  All other systems were reviewed with the patient and are negative.  PHYSICAL EXAMINATION: ECOG PERFORMANCE STATUS: 0 - Asymptomatic  Vitals:   05/04/24 1005 05/04/24 1006  BP: (!) 144/94 (!) 144/99  Pulse: 90   Resp: 18   Temp: 98.4 F (36.9 C)   SpO2: 96%    Filed Weights   05/04/24 1005  Weight: 264 lb 6 oz (119.9 kg)    GENERAL: Well-appearing middle-aged  African-American male alert, no distress and comfortable SKIN: skin color, texture, turgor are normal, no rashes or significant lesions EYES: conjunctiva are pink and non-injected, sclera clear NECK: supple, non-tender LYMPH: Mildly prominent left-sided cervical lymph nodes, no  other overt lymphadenopathy noted on exam LUNGS: clear to auscultation and percussion with normal breathing effort HEART: regular rate & rhythm and no murmurs and no lower extremity edema Musculoskeletal: no cyanosis of digits and no clubbing  PSYCH: alert & oriented x 3, fluent speech NEURO: no focal motor/sensory deficits  LABORATORY DATA:  I have reviewed the data as listed    Latest Ref Rng & Units 05/04/2024    9:40 AM 04/06/2024   11:08 AM 03/10/2024   11:06 AM  CBC  WBC 4.0 - 10.5 K/uL 5.9  4.3  5.1   Hemoglobin 13.0 - 17.0 g/dL 84.4  84.8  84.7   Hematocrit 39.0 - 52.0 % 46.9  46.1  46.2   Platelets 150 - 400 K/uL 157  244  203        Latest Ref Rng & Units 05/04/2024    9:40 AM 04/06/2024   11:08 AM 03/10/2024   11:06 AM  CMP  Glucose 70 - 99 mg/dL 873  846  868   BUN 6 - 20 mg/dL 13  14  16    Creatinine 0.61 - 1.24 mg/dL 9.05  9.08  9.07   Sodium 135 - 145 mmol/L 141  142  139   Potassium 3.5 - 5.1 mmol/L 3.7  3.5  3.8   Chloride 98 - 111 mmol/L 110  108  106   CO2 22 - 32 mmol/L 23  24  22    Calcium  8.9 - 10.3 mg/dL 9.1  9.5  9.6   Total Protein 6.5 - 8.1 g/dL 7.1  7.2  7.1   Total Bilirubin 0.0 - 1.2 mg/dL 0.5  0.4  0.3   Alkaline Phos 38 - 126 U/L 55  58  55   AST 15 - 41 U/L 15  14  16    ALT 0 - 44 U/L 14  12  14      RADIOGRAPHIC STUDIES:  No results found.  ASSESSMENT & PLAN James Parrish 58 y.o. male with medical history significant for CLL who presents for a follow up visit.   Due to progression of disease with rapidly rising white blood cell count the patient was started on obinutuzumab /venetoclax  therapy.  This was after discussion of different options including acalabrutinib/zanubrutinib.  The patient preferred the limited duration therapy.  # CLL, Rai Stage 0 --Due to rapidly rising white blood cell count patient was started on obinutuzumab /venetoclax .  Cycle 1 day 1 was on 01/13/2024. Obinutuzmab to continue x 6 months.  --Patient has started  therapy and is tolerating it well. --Labs show white blood cell count 5.9, hemoglobin 15.5, MCV 74.3, platelets 157  -- Venetoclax  added with cycle 2. Plan to continue x 12 months.  --RTC for Cycle 6 Day 1 with continued interval venetoclax .   No orders of the defined types were placed in this encounter.   All questions were answered. The patient knows to call the clinic with any problems, questions or concerns.  I have spent a total of 30 minutes minutes of face-to-face and non-face-to-face time, preparing to see the patient,  performing a medically appropriate examination, counseling and educating the patient, documenting clinical information in the electronic health record, and care coordination.   Norleen IVAR Kidney, MD Department of Hematology/Oncology [facility] Phone: [phone] Pager: [phone] Email: [email or web address]   05/05/2024 8:38 PM  Shermon CHRISTELLA Hearing BD, Catovsky D, Caligaris-Cappio F, Dighiero G, Dhner H, Hillmen P, Keating M, Montserrat E, Chiorazzi N, Stilgenbauer S, Rai KR, [place], Eichhorst B, O'Brien S, Robak T, Seymour JF, Kipps TJ. iwCLL guidelines for diagnosis, indications for treatment, response assessment, and supportive management of CLL. Blood. 2018 Jun 21;131(25):2745-2760.  Active disease should be clearly documented to initiate therapy. At least 1 of the following criteria should be met.  1) Evidence of progressive marrow failure as manifested by the development of, or worsening of, anemia and/or thrombocytopenia. Cutoff levels of Hb <10 g/dL or platelet counts <899  109/L are generally regarded as indication for treatment. However, in some patients, platelet counts <100  109/L may remain stable over a long period; this situation does not automatically require therapeutic intervention. 2) Massive (ie, >=6 cm below the left costal margin) or progressive or symptomatic splenomegaly. 3) Massive nodes (ie,  >=10 cm in longest diameter) or progressive or symptomatic lymphadenopathy. 4) Progressive lymphocytosis with an increase of >=50% over a 32-month period, or lymphocyte doubling time (LDT) <6 months. LDT can be obtained by linear regression extrapolation of absolute lymphocyte counts obtained at intervals of 2 weeks over an observation period of 2 to 3 months; patients with initial blood lymphocyte counts <30  109/L may require a longer observation period to determine the LDT. Factors contributing to lymphocytosis other than CLL (eg, infections, steroid administration) should be excluded. 5) Autoimmune complications including anemia or thrombocytopenia poorly responsive to corticosteroids. 6) Symptomatic or functional extranodal involvement (eg, skin, kidney, lung, spine). Disease-related symptoms as defined by any of the following: Unintentional weight loss >=10% within the previous 6 months. Significant fatigue (ie, ECOG performance scale 2 or worse; cannot work or unable to perform usual activities). Fevers >=100.54F or 38.0C for 2 or more weeks without evidence of infection. Night sweats for >=1 month without evidence of infection.

## 2024-05-04 NOTE — Patient Instructions (Signed)
 [facility] - A DEPT OF [place] [facility]  Discharge Instructions: Thank you for choosing [facility] to provide your oncology and hematology care.   If you have a lab appointment with the Cancer Center, please go directly to the Cancer Center and check in at the registration area.   Wear comfortable clothing and clothing appropriate for easy access to any Portacath or PICC line.   We strive to give you quality time with your provider. You may need to reschedule your appointment if you arrive late (15 or more minutes).  Arriving late affects you and other patients whose appointments are after yours.  Also, if you miss three or more appointments without notifying the office, you may be dismissed from the clinic at the provider's discretion.      For prescription refill requests, have your pharmacy contact our office and allow 72 hours for refills to be completed.    Today you received the following chemotherapy and/or immunotherapy agent: Obinutuzumab  (Gazyva )   To help prevent nausea and vomiting after your treatment, we encourage you to take your nausea medication as directed.  BELOW ARE SYMPTOMS THAT SHOULD BE REPORTED IMMEDIATELY: *FEVER GREATER THAN 100.4 F (38 C) OR HIGHER *CHILLS OR SWEATING *NAUSEA AND VOMITING THAT IS NOT CONTROLLED WITH YOUR NAUSEA MEDICATION *UNUSUAL SHORTNESS OF BREATH *UNUSUAL BRUISING OR BLEEDING *URINARY PROBLEMS (pain or burning when urinating, or frequent urination) *BOWEL PROBLEMS (unusual diarrhea, constipation, pain near the anus) TENDERNESS IN MOUTH AND THROAT WITH OR WITHOUT PRESENCE OF ULCERS (sore throat, sores in mouth, or a toothache) UNUSUAL RASH, SWELLING OR PAIN  UNUSUAL VAGINAL DISCHARGE OR ITCHING   Items with * indicate a potential emergency and should be followed up as soon as possible or go to the Emergency Department if any problems should occur.  Please show the CHEMOTHERAPY ALERT CARD or  IMMUNOTHERAPY ALERT CARD at check-in to the Emergency Department and triage nurse.  Should you have questions after your visit or need to cancel or reschedule your appointment, please contact [facility] - A DEPT OF [facility]  Dept: [phone]  and follow the prompts.  Office hours are 8:00 a.m. to 4:30 p.m. Monday - Friday. Please note that voicemails left after 4:00 p.m. may not be returned until the following business day.  We are closed weekends and major holidays. You have access to a nurse at all times for urgent questions. Please call the main number to the clinic Dept: [phone] and follow the prompts.   For any non-urgent questions, you may also contact your provider using MyChart. We now offer e-Visits for anyone 66 and older to request care online for non-urgent symptoms. For details visit [email or web address].   Also download the MyChart app! Go to the app store, search MyChart, open the app, select [place], and log in with your MyChart username and password.  Obinutuzumab  Injection What is this medication? OBINUTUZUMAB  (OH bi nue TOOZ ue mab) treats leukemia and lymphoma. It works by blocking a protein that causes cancer cells to grow and multiply. This helps to slow or stop the spread of cancer cells. It is a monoclonal antibody. This medicine may be used for other purposes; ask your health care provider or pharmacist if you have questions. COMMON BRAND NAME(S): GAZYVA  What should I tell my care team before I take this medication? They need to know if you have any of these conditions: Heart  disease Infection, especially a viral infection, such as hepatitis B Lung or breathing disease Take medications that treat or prevent blood clots An unusual or allergic reaction to obinutuzumab , other medications, foods, dyes, or preservatives Pregnant or trying to get pregnant Breastfeeding How should I use this medication? This medication is  for infusion into a vein. It is given by a care team in a hospital or clinic setting. Talk to your care team about the use of this medication in children. Special care may be needed. Overdosage: If you think you have taken too much of this medicine contact a poison control center or emergency room at once. NOTE: This medicine is only for you. Do not share this medicine with others. What if I miss a dose? Keep appointments for follow-up doses as directed. It is important not to miss your dose. Call your care team if you are unable to keep an appointment. What may interact with this medication? Live virus vaccines This list may not describe all possible interactions. Give your health care provider a list of all the medicines, herbs, non-prescription drugs, or dietary supplements you use. Also tell them if you smoke, drink alcohol, or use illegal drugs. Some items may interact with your medicine. What should I watch for while using this medication? Report any side effects that you notice during your treatment right away, such as changes in your breathing, fever, chills, dizziness or lightheadedness. These effects are more common with the first dose. Visit your care team for checks on your progress. You will need to have regular blood work. Report any other side effects. The side effects of this medication can continue after you finish your treatment. Continue your course of treatment even though you feel ill unless your care team tells you to stop. Call your care team for advice if you get a fever, chills or sore throat, or other symptoms of a cold or flu. Do not treat yourself. This medication decreases your body's ability to fight infections. Try to avoid being around people who are sick. This medication may increase your risk to bruise or bleed. Call your care team if you notice any unusual bleeding. Do not become pregnant while taking this medication or for 6 months after stopping it. Inform your care  team if you wish to become pregnant or think you might be pregnant. There is a potential for serious side effects to an unborn child. Talk to your care team or pharmacist for more information. Do not breast-feed an infant while taking this medication or for 6 months after stopping it. What side effects may I notice from receiving this medication? Side effects that you should report to your care team as soon as possible: Allergic reactions--skin rash, itching, hives, swelling of the face, lips, tongue, or throat Bleeding--bloody or black, tar-like stools, vomiting blood or brown material that looks like coffee grounds, red or dark brown urine, small red or purple spots on skin, unusual bruising or bleeding Blood clot--pain, swelling, or warmth in the leg, shortness of breath, chest pain Dizziness, loss of balance or coordination, confusion or trouble speaking Infection--fever, chills, cough, sore throat, wounds that don't heal, pain or trouble when passing urine, general feeling of discomfort or being unwell Infusion reactions--chest pain, shortness of breath or trouble breathing, feeling faint or lightheaded Liver injury--right upper belly pain, loss of appetite, nausea, light-colored stool, dark yellow or brown urine, yellowing skin or eyes, unusual weakness or fatigue Tumor lysis syndrome (TLS)--nausea, vomiting, diarrhea, decrease in the  amount of urine, dark urine, unusual weakness or fatigue, confusion, muscle pain or cramps, fast or irregular heartbeat, joint pain Side effects that usually do not require medical attention (report to your care team if they continue or are bothersome): Bone, joint, or muscle pain Constipation Diarrhea Fatigue Runny or stuffy nose Sore throat This list may not describe all possible side effects. Call your doctor for medical advice about side effects. You may report side effects to FDA at 1-800-FDA-1088. Where should I keep my medication? This medication is  only given in a hospital or clinic and will not be stored at home. NOTE: This sheet is a summary. It may not cover all possible information. If you have questions about this medicine, talk to your doctor, pharmacist, or health care provider.  2024 Elsevier/Gold Standard (2022-02-21 00:00:00)

## 2024-05-05 ENCOUNTER — Encounter: Payer: Self-pay | Admitting: Hematology and Oncology

## 2024-05-12 ENCOUNTER — Other Ambulatory Visit (HOSPITAL_COMMUNITY): Payer: Self-pay

## 2024-05-13 ENCOUNTER — Other Ambulatory Visit: Payer: Self-pay | Admitting: Internal Medicine

## 2024-05-13 DIAGNOSIS — I10 Essential (primary) hypertension: Secondary | ICD-10-CM

## 2024-05-13 DIAGNOSIS — E119 Type 2 diabetes mellitus without complications: Secondary | ICD-10-CM

## 2024-05-14 ENCOUNTER — Other Ambulatory Visit: Payer: Self-pay | Admitting: *Deleted

## 2024-05-14 MED ORDER — ALLOPURINOL 300 MG PO TABS: 300.0000 mg | ORAL_TABLET | Freq: Every day | ORAL | 3 refills | Status: DC

## 2024-05-18 ENCOUNTER — Ambulatory Visit (INDEPENDENT_AMBULATORY_CARE_PROVIDER_SITE_OTHER): Payer: 59 | Admitting: Internal Medicine

## 2024-05-18 ENCOUNTER — Encounter: Payer: Self-pay | Admitting: Hematology and Oncology

## 2024-05-18 ENCOUNTER — Encounter: Payer: Self-pay | Admitting: Internal Medicine

## 2024-05-18 VITALS — BP 136/84 | HR 71 | Ht 74.0 in | Wt 271.0 lb

## 2024-05-18 DIAGNOSIS — E119 Type 2 diabetes mellitus without complications: Secondary | ICD-10-CM

## 2024-05-18 DIAGNOSIS — Z794 Long term (current) use of insulin: Secondary | ICD-10-CM | POA: Diagnosis not present

## 2024-05-18 LAB — POCT GLYCOSYLATED HEMOGLOBIN (HGB A1C): Hemoglobin A1C: 7.1 % — AB (ref 4.0–5.6)

## 2024-05-18 LAB — POCT GLUCOSE (DEVICE FOR HOME USE): POC Glucose: 139 mg/dL — AB (ref 70–99)

## 2024-05-18 NOTE — Progress Notes (Unsigned)
 Name: James Parrish  Age/ Sex: 58 y.o., male   MRN/ DOB: [id number], Mar 24, 1966     PCP: Joshua Debby CROME, MD   Reason for Endocrinology Evaluation: Type 2 Diabetes Mellitus  Initial Endocrine Consultative Visit: 07/13/2019    PATIENT IDENTIFIER: James Parrish is a 58 y.o. male with a past medical history of T2Dm, HTN and Dyslipidemia, CLL (12/2021). The patient has followed with Endocrinology clinic since 07/13/2019 for consultative assistance with management of his diabetes.  DIABETIC HISTORY:  James Parrish was diagnosed with DM prior to 2010, was initially on oral glycemic agents, insulin  started in 2013. His hemoglobin A1c has ranged from 7.2% in 2018, peaking at 8.8% in 2019.  On his initial visit to our clinic, his A1c 7.7% . He was on lantus , victoza, metformin  and Jardiance . We switched Victoza to Ozempic .    He is a [employer]    THYROID  HISTORY: He is S/P  left thyroidectomy including substernal thyroid  10/19/2021 with pathology report consistent with benign nodular hyperplasia, negative for malignancy   SUBJECTIVE:   During the last visit (11/18/2023): A1c 6.0%    Today (05/18/2024): James Parrish is here for a  follow up on diabetes.  He has not been checking glucose at home.   He was diagnosed with CLL 12/2021, follows up with oncology, he was started on Obinutuzumab  due to rising white cell count.  The patient also receives 100 mg of dexamethasone  during this infusions, will recoieve his last treatment on 8/18th  Than he will start Venclexta  for a year   He did have a transient episode of diarrhea but no vomiting  Had an episode of pruritus that resolved with antihistamine  He has lost weight  Continues with occasional fatigue   HOME DIABETES REGIMEN:  Ozempic  2 mg weekly ( Fridays )  Farxiga  10 mg daily  Metformin  1000 mg Twice daily  Lantus   34 units daily      DIABETIC COMPLICATIONS: Microvascular complications:    Denies: CKD, retinopathy,  neuropathy  Last eye exam: Completed 10/2021   Macrovascular complications:    Denies: CAD, PVD, CVA  METER DOWNLOAD SUMMARY: n/a     HISTORY:  Past Medical History:  Past Medical History:  Diagnosis Date   CLL (chronic lymphocytic leukemia) (HCC)    Complication of anesthesia    hard to wake up    Diabetes mellitus without complication (HCC)    ED (erectile dysfunction)    History of COVID-19 04/2019   Hyperlipidemia    Hypertension    Sleep apnea    uses CPAP nightly   Past Surgical History:  Past Surgical History:  Procedure Laterality Date   BRONCHIAL NEEDLE ASPIRATION BIOPSY  05/29/2021   Procedure: BRONCHIAL NEEDLE ASPIRATION BIOPSIES;  Surgeon: Brenna Adine CROME, DO;  Location: [facility];  Service: Pulmonary;;   COLONOSCOPY     finger surgery Right    Index   KNEE SURGERY Left    KNEE SURGERY Right    ORIF PATELLA Right    REVERSE SHOULDER ARTHROPLASTY Right 08/31/2020   Procedure: REVERSE SHOULDER ARTHROPLASTY;  Surgeon: Cristy Bonner DASEN, MD;  Location: [facility];  Service: Orthopedics;  Laterality: Right;   SHOULDER ARTHROSCOPY WITH ROTATOR CUFF REPAIR Left 02/04/2020   Procedure: SHOULDER ARTHROSCOPY WITH ROTATOR CUFF REPAIR WITH BICEP TENODESIS;  Surgeon: Cristy Bonner DASEN, MD;  Location: [facility];  Service: Orthopedics;  Laterality: Left;   VIDEO BRONCHOSCOPY WITH ENDOBRONCHIAL ULTRASOUND  Left 05/29/2021   Procedure: VIDEO BRONCHOSCOPY WITH ENDOBRONCHIAL ULTRASOUND;  Surgeon: Brenna Adine CROME, DO;  Location: [facility];  Service: Pulmonary;  Laterality: Left;   Social History:  reports that he quit smoking about 4 years ago. His smoking use included cigarettes. He started smoking about 29 years ago. He has a 37.5 pack-year smoking history. He has been exposed to tobacco smoke. He has never used smokeless tobacco. He reports that he does not currently use alcohol. He reports that he does not use drugs. Family History:  Family History   Problem Relation Age of Onset   Cancer Mother        breast cancer    Hypertension Mother    Diabetes Mother    Venous thrombosis Son    Colon cancer Neg Hx    Esophageal cancer Neg Hx    Stomach cancer Neg Hx    Rectal cancer Neg Hx      HOME MEDICATIONS: Allergies as of 05/18/2024       Reactions   Gazyva  [obinutuzumab ] Other (See Comments)   Pt with mild to moderate back pain beginning 2 hours after starting infusion. Resolved with emergency meds. See MAR on 01/13/24.        Medication List        Accurate as of May 18, 2024  2:42 PM. If you have any questions, ask your nurse or doctor.          allopurinol  300 MG tablet Commonly known as: ZYLOPRIM  Take 1 tablet (300 mg total) by mouth daily.   amLODipine  10 MG tablet Commonly known as: NORVASC  TAKE 1 TABLET BY MOUTH AT BEDTIME   atorvastatin  10 MG tablet Commonly known as: LIPITOR TAKE 1 TABLET BY MOUTH DAILY   dapagliflozin  propanediol 10 MG Tabs tablet Commonly known as: Farxiga  Take 1 tablet (10 mg total) by mouth daily before breakfast.   diclofenac 75 MG EC tablet Commonly known as: VOLTAREN Take 75 mg by mouth 2 (two) times daily.   glucose blood test strip Use as instructed to test blood sugar 2 times daily E11.65    Onetouch strips   indapamide  1.25 MG tablet Commonly known as: LOZOL  TAKE 1 TABLET BY MOUTH DAILY   Insulin  Pen Needle 29G X Misc 1 Device by Does not apply route daily in the afternoon.   Lantus  SoloStar 100 UNIT/ML Solostar Pen Generic drug: insulin  glargine Inject 30 Units into the skin daily.   losartan  100 MG tablet Commonly known as: COZAAR  TAKE 1 TABLET BY MOUTH DAILY   metFORMIN  1000 MG tablet Commonly known as: GLUCOPHAGE  Take 1 tablet (1,000 mg total) by mouth 2 (two) times daily with a meal.   multivitamin with minerals Tabs tablet Take 1 tablet by mouth daily.   nebivolol  10 MG tablet Commonly known as: BYSTOLIC  TAKE 1 TABLET BY MOUTH DAILY    ondansetron  8 MG tablet Commonly known as: ZOFRAN  Take 1 tablet (8 mg total) by mouth every 8 (eight) hours as needed for nausea or vomiting.   oseltamivir  75 MG capsule Commonly known as: TAMIFLU  Take 1 capsule (75 mg total) by mouth 2 (two) times daily.   Ozempic  (2 MG/DOSE) 8 MG/3ML Sopn Generic drug: Semaglutide  (2 MG/DOSE) DIAL AND INJECT UNDER THE SKIN 2 MG WEEKLY   prochlorperazine  10 MG tablet Commonly known as: COMPAZINE  Take 1 tablet (10 mg total) by mouth every 6 (six) hours as needed for nausea or vomiting.   promethazine -dextromethorphan 6.25-15 MG/5ML syrup Commonly known as:  PROMETHAZINE -DM Take 5 mLs by mouth 3 (three) times daily as needed for cough.   spironolactone  50 MG tablet Commonly known as: ALDACTONE  TAKE 1 TABLET BY MOUTH DAILY   tadalafil  20 MG tablet Commonly known as: CIALIS  TAKE 1 TABLET BY MOUTH DAILY AS NEEDED FOR ERECTILE DYSFUNCTION   Venclexta  100 MG tablet Generic drug: venetoclax  Take 4 tablets (400 mg total) by mouth daily. Tablets should be swallowed whole with a meal and a full glass of water.        OBJECTIVE:   Vital Signs: BP 136/84 (BP Location: Left Arm, Patient Position: Sitting, Cuff Size: Normal)   Pulse 71   Ht 6' 2 (1.88 m)   Wt 271 lb (122.9 kg)   SpO2 97%   BMI 34.79 kg/m    Wt Readings from Last 3 Encounters:  05/18/24 271 lb (122.9 kg)  05/04/24 264 lb 6 oz (119.9 kg)  04/06/24 261 lb 4 oz (118.5 kg)     Exam: General: Pt appears well and is in NAD  Lungs: Clear with good BS bilat   Heart: RRR   Extremities: No pretibial edema.   Neuro: MS is good with appropriate affect, pt is alert and Ox3    DM foot exam: 11/18/2023 The skin of the feet is intact without sores or ulcerations. The pedal pulses are 2+ on right and 2+ on left. The sensation is intact to a screening 5.07, 10 gram monofilament bilaterally     DATA REVIEWED:  Lab Results  Component Value Date   HGBA1C 6.0 (A) 11/18/2023    HGBA1C 6.8 (H) 07/08/2023   HGBA1C 7.4 (H) 01/28/2023    Latest Reference Range & Units 05/04/24 09:40  Sodium 135 - 145 mmol/L 141  Potassium 3.5 - 5.1 mmol/L 3.7  Chloride 98 - 111 mmol/L 110  CO2 22 - 32 mmol/L 23  Glucose 70 - 99 mg/dL 873 (H)  BUN 6 - 20 mg/dL 13  Creatinine 9.38 - 8.75 mg/dL 9.05  Calcium  8.9 - 10.3 mg/dL 9.1  Anion gap 5 - 15  8  Alkaline Phosphatase 38 - 126 U/L 55  Albumin 3.5 - 5.0 g/dL 4.5  Uric Acid, Serum 3.7 - 8.6 mg/dL 3.8  AST 15 - 41 U/L 15  ALT 0 - 44 U/L 14  Total Protein 6.5 - 8.1 g/dL 7.1  Total Bilirubin 0.0 - 1.2 mg/dL 0.5  GFR, Est Non African American >60 mL/min >60    In office BG 139 mg/dL   ASSESSMENT / PLAN / RECOMMENDATIONS:   1) Type 2 Diabetes Mellitus,Sub- Optimally controlled, Without  complications - Most recent A1c of 7.1 %. Goal A1c < 7.0 %.     - His A1c has trended up , most likely due to dexamethasone  intake during infusions, he has one more infusion -Discussed the importance of lifestyle changes with low-carb diet and exercise -No changes at this time   MEDICATIONS: -Continue  Ozempic  2 mg weekly  -Continue Farxiga  10 mg daily  mg daily  -Continue Metformin  1000 mg Twice daily  -Continue  Lantus  30 units daily    EDUCATION / INSTRUCTIONS: BG monitoring instructions: Patient is instructed to check his blood sugars 1 time a day. Call [facility] if: BG persistently < 70  I reviewed the Rule of 15 for the treatment of hypoglycemia in detail with the patient. Literature supplied.     F/U in 4 months   I spent 25 minutes preparing to see the patient by  review of recent labs, imaging and procedures, obtaining and reviewing separately obtained history, communicating with the patient, ordering medications, tests or procedures, and documenting clinical information in the EHR including the differential Dx, treatment, and any further evaluation and other management    Signed electronically by: Stefano Redgie Butts, MD  [facility] Endocrinology  [facility] [address] [place] [place], [place] [address] Phone: [phone] FAX: [phone]   CC: Joshua Debby CROME, MD [address] [place] [address] Phone: [phone]  Fax: [phone]  Return to Endocrinology clinic as below: Future Appointments  Date Time Provider Department Center  06/01/2024 10:15 AM [facility] LAB [facility] None  06/01/2024 10:40 AM Federico Norleen ONEIDA MADISON, MD [facility] None  06/01/2024 11:30 AM [facility] INFUSION [facility] None  07/06/2024  1:40 PM Joshua Debby CROME, MD [facility] None

## 2024-05-18 NOTE — Patient Instructions (Signed)
-   Continue Ozempic  2 mg weekly  - Continue Metformin  1000 mg, 1 tablet twice daily  - Continue Farxiga  10 mg , 1 tablet with Breakfast  - Continue Lantus   30 units daily     HOW TO TREAT LOW BLOOD SUGARS (Blood sugar LESS THAN 70 MG/DL) Please follow the RULE OF 15 for the treatment of hypoglycemia treatment (when your (blood sugars are less than 70 mg/dL)   STEP 1: Take 15 grams of carbohydrates when your blood sugar is low, which includes:  3-4 GLUCOSE TABS  OR 3-4 OZ OF JUICE OR REGULAR SODA OR ONE TUBE OF GLUCOSE GEL    STEP 2: RECHECK blood sugar in 15 MINUTES STEP 3: If your blood sugar is still low at the 15 minute recheck --> then, go back to STEP 1 and treat AGAIN with another 15 grams of carbohydrates.

## 2024-05-19 ENCOUNTER — Other Ambulatory Visit: Payer: Self-pay

## 2024-05-19 ENCOUNTER — Other Ambulatory Visit: Payer: Self-pay | Admitting: Hematology and Oncology

## 2024-05-19 ENCOUNTER — Other Ambulatory Visit (HOSPITAL_COMMUNITY): Payer: Self-pay

## 2024-05-19 DIAGNOSIS — C9110 Chronic lymphocytic leukemia of B-cell type not having achieved remission: Secondary | ICD-10-CM

## 2024-05-19 MED ORDER — VENETOCLAX 100 MG PO TABS
400.0000 mg | ORAL_TABLET | Freq: Every day | ORAL | 0 refills | Status: DC
  Filled 2024-05-19 – 2024-05-22 (×2): qty 112, 28d supply, fill #0

## 2024-05-22 ENCOUNTER — Other Ambulatory Visit (HOSPITAL_COMMUNITY): Payer: Self-pay

## 2024-05-22 ENCOUNTER — Other Ambulatory Visit: Payer: Self-pay

## 2024-05-22 NOTE — Progress Notes (Signed)
 Specialty Pharmacy Refill Coordination Note  Spoke with [name], [name] (wife)  James Parrish is a 58 y.o. male contacted today regarding refills of specialty medication(s) Venetoclax  (VENCLEXTA )  Doses on hand: 13   Patient requested: Pickup at [facility] at [place] date: 05/26/24  Medication will be filled on 05/25/24.

## 2024-05-25 ENCOUNTER — Other Ambulatory Visit: Payer: Self-pay | Admitting: Internal Medicine

## 2024-05-25 ENCOUNTER — Other Ambulatory Visit: Payer: Self-pay

## 2024-05-25 DIAGNOSIS — I10 Essential (primary) hypertension: Secondary | ICD-10-CM

## 2024-05-26 ENCOUNTER — Other Ambulatory Visit (HOSPITAL_COMMUNITY): Payer: Self-pay

## 2024-05-26 NOTE — Progress Notes (Signed)
 Specialty Pharmacy Ongoing Clinical Assessment Note  I spoke to the patient's wife, James Parrish. James Parrish is a 58 y.o. male who is being followed by the specialty pharmacy service for [facility] On[facility]   Patient's specialty medication(s) reviewed today: Venetoclax  (VENCLEXTA )   Missed doses in the last 4 weeks: 1 (was away from home)   Patient/Caregiver did not have any additional questions or concerns.   Therapeutic benefit summary: Unable to assess   Adverse events/side effects summary: No adverse events/side effects   Patient's therapy is appropriate to: Continue    Goals Addressed             This Visit's Progress    Maintain optimal adherence to therapy   On track    Patient is on track. Patient will maintain adherence         Follow up: 3 months  James Parrish Specialty Pharmacist

## 2024-05-29 MED FILL — Dexamethasone Sodium Phosphate Inj 100 MG/10ML: INTRAMUSCULAR | Qty: 2 | Status: AC

## 2024-06-01 ENCOUNTER — Inpatient Hospital Stay

## 2024-06-01 ENCOUNTER — Inpatient Hospital Stay: Attending: Hematology and Oncology | Admitting: Hematology and Oncology

## 2024-06-01 VITALS — BP 128/89 | HR 87 | Resp 16

## 2024-06-01 VITALS — BP 134/92 | HR 92 | Temp 98.4°F | Resp 18 | Ht 74.0 in | Wt 269.8 lb

## 2024-06-01 DIAGNOSIS — Z5112 Encounter for antineoplastic immunotherapy: Secondary | ICD-10-CM | POA: Diagnosis present

## 2024-06-01 DIAGNOSIS — C9110 Chronic lymphocytic leukemia of B-cell type not having achieved remission: Secondary | ICD-10-CM

## 2024-06-01 DIAGNOSIS — D72820 Lymphocytosis (symptomatic): Secondary | ICD-10-CM

## 2024-06-01 DIAGNOSIS — Z87891 Personal history of nicotine dependence: Secondary | ICD-10-CM | POA: Insufficient documentation

## 2024-06-01 DIAGNOSIS — Z803 Family history of malignant neoplasm of breast: Secondary | ICD-10-CM | POA: Diagnosis not present

## 2024-06-01 LAB — CMP (CANCER CENTER ONLY)
ALT: 18 U/L (ref 0–44)
AST: 20 U/L (ref 15–41)
Albumin: 4.6 g/dL (ref 3.5–5.0)
Alkaline Phosphatase: 55 U/L (ref 38–126)
Anion gap: 10 (ref 5–15)
BUN: 12 mg/dL (ref 6–20)
CO2: 24 mmol/L (ref 22–32)
Calcium: 9.2 mg/dL (ref 8.9–10.3)
Chloride: 107 mmol/L (ref 98–111)
Creatinine: 0.89 mg/dL (ref 0.61–1.24)
GFR, Estimated: >60 mL/min (ref >60)
Glucose, Bld: 145 mg/dL — ABNORMAL HIGH (ref 70–99)
Potassium: 3.8 mmol/L (ref 3.5–5.1)
Sodium: 141 mmol/L (ref 135–145)
Total Bilirubin: 0.3 mg/dL (ref 0.0–1.2)
Total Protein: 7 g/dL (ref 6.5–8.1)

## 2024-06-01 LAB — CBC WITH DIFFERENTIAL (CANCER CENTER ONLY)
Abs Immature Granulocytes: 0.13 K/uL — ABNORMAL HIGH (ref 0.00–0.07)
Basophils Absolute: 0 K/uL (ref 0.0–0.1)
Basophils Relative: 1 %
Eosinophils Absolute: 0 K/uL (ref 0.0–0.5)
Eosinophils Relative: 0 %
HCT: 47.2 % (ref 39.0–52.0)
Hemoglobin: 15.4 g/dL (ref 13.0–17.0)
Immature Granulocytes: 4 %
Lymphocytes Relative: 49 %
Lymphs Abs: 1.9 K/uL (ref 0.7–4.0)
MCH: 24.7 pg — ABNORMAL LOW (ref 26.0–34.0)
MCHC: 32.6 g/dL (ref 30.0–36.0)
MCV: 75.6 fL — ABNORMAL LOW (ref 80.0–100.0)
Monocytes Absolute: 0.5 K/uL (ref 0.1–1.0)
Monocytes Relative: 14 %
Neutro Abs: 1.2 K/uL — ABNORMAL LOW (ref 1.7–7.7)
Neutrophils Relative %: 32 %
Platelet Count: 209 K/uL (ref 150–400)
RBC: 6.24 MIL/uL — ABNORMAL HIGH (ref 4.22–5.81)
RDW: 15.8 % — ABNORMAL HIGH (ref 11.5–15.5)
WBC Count: 3.7 K/uL — ABNORMAL LOW (ref 4.0–10.5)
nRBC: 0 % (ref 0.0–0.2)

## 2024-06-01 LAB — LACTATE DEHYDROGENASE: LDH: 146 U/L (ref 98–192)

## 2024-06-01 LAB — URIC ACID: Uric Acid, Serum: 4.8 mg/dL (ref 3.7–8.6)

## 2024-06-01 MED ORDER — ACETAMINOPHEN 325 MG PO TABS
650.0000 mg | ORAL_TABLET | Freq: Once | ORAL | Status: AC
  Administered 2024-06-01: 650 mg via ORAL
  Filled 2024-06-01: qty 2

## 2024-06-01 MED ORDER — DIPHENHYDRAMINE HCL 50 MG/ML IJ SOLN
50.0000 mg | Freq: Once | INTRAMUSCULAR | Status: AC
  Administered 2024-06-01: 50 mg via INTRAVENOUS
  Filled 2024-06-01: qty 1

## 2024-06-01 MED ORDER — SODIUM CHLORIDE 0.9 % IV SOLN: INTRAVENOUS | Status: DC

## 2024-06-01 MED ORDER — SODIUM CHLORIDE 0.9 % IV SOLN
1000.0000 mg | Freq: Once | INTRAVENOUS | Status: AC
  Administered 2024-06-01: 1000 mg via INTRAVENOUS
  Filled 2024-06-01: qty 40

## 2024-06-01 MED ORDER — SODIUM CHLORIDE 0.9 % IV SOLN
20.0000 mg | Freq: Once | INTRAVENOUS | Status: AC
  Administered 2024-06-01: 20 mg via INTRAVENOUS
  Filled 2024-06-01: qty 20

## 2024-06-01 MED ORDER — FAMOTIDINE IN NACL 20-0.9 MG/50ML-% IV SOLN
20.0000 mg | Freq: Once | INTRAVENOUS | Status: AC
  Administered 2024-06-01: 20 mg via INTRAVENOUS
  Filled 2024-06-01: qty 50

## 2024-06-01 NOTE — Patient Instructions (Signed)
 [facility] - A DEPT OF [place] [facility]  Discharge Instructions: Thank you for choosing [facility] to provide your oncology and hematology care.   If you have a lab appointment with the Cancer Center, please go directly to the Cancer Center and check in at the registration area.   Wear comfortable clothing and clothing appropriate for easy access to any Portacath or PICC line.   We strive to give you quality time with your provider. You may need to reschedule your appointment if you arrive late (15 or more minutes).  Arriving late affects you and other patients whose appointments are after yours.  Also, if you miss three or more appointments without notifying the office, you may be dismissed from the clinic at the provider's discretion.      For prescription refill requests, have your pharmacy contact our office and allow 72 hours for refills to be completed.    Today you received the following chemotherapy and/or immunotherapy agent: Obinutuzumab  (Gazyva )   To help prevent nausea and vomiting after your treatment, we encourage you to take your nausea medication as directed.  BELOW ARE SYMPTOMS THAT SHOULD BE REPORTED IMMEDIATELY: *FEVER GREATER THAN 100.4 F (38 C) OR HIGHER *CHILLS OR SWEATING *NAUSEA AND VOMITING THAT IS NOT CONTROLLED WITH YOUR NAUSEA MEDICATION *UNUSUAL SHORTNESS OF BREATH *UNUSUAL BRUISING OR BLEEDING *URINARY PROBLEMS (pain or burning when urinating, or frequent urination) *BOWEL PROBLEMS (unusual diarrhea, constipation, pain near the anus) TENDERNESS IN MOUTH AND THROAT WITH OR WITHOUT PRESENCE OF ULCERS (sore throat, sores in mouth, or a toothache) UNUSUAL RASH, SWELLING OR PAIN  UNUSUAL VAGINAL DISCHARGE OR ITCHING   Items with * indicate a potential emergency and should be followed up as soon as possible or go to the Emergency Department if any problems should occur.  Please show the CHEMOTHERAPY ALERT CARD or  IMMUNOTHERAPY ALERT CARD at check-in to the Emergency Department and triage nurse.  Should you have questions after your visit or need to cancel or reschedule your appointment, please contact [facility] - A DEPT OF [facility]  Dept: [phone]  and follow the prompts.  Office hours are 8:00 a.m. to 4:30 p.m. Monday - Friday. Please note that voicemails left after 4:00 p.m. may not be returned until the following business day.  We are closed weekends and major holidays. You have access to a nurse at all times for urgent questions. Please call the main number to the clinic Dept: [phone] and follow the prompts.   For any non-urgent questions, you may also contact your provider using MyChart. We now offer e-Visits for anyone 66 and older to request care online for non-urgent symptoms. For details visit [email or web address].   Also download the MyChart app! Go to the app store, search MyChart, open the app, select [place], and log in with your MyChart username and password.  Obinutuzumab  Injection What is this medication? OBINUTUZUMAB  (OH bi nue TOOZ ue mab) treats leukemia and lymphoma. It works by blocking a protein that causes cancer cells to grow and multiply. This helps to slow or stop the spread of cancer cells. It is a monoclonal antibody. This medicine may be used for other purposes; ask your health care provider or pharmacist if you have questions. COMMON BRAND NAME(S): GAZYVA  What should I tell my care team before I take this medication? They need to know if you have any of these conditions: Heart  disease Infection, especially a viral infection, such as hepatitis B Lung or breathing disease Take medications that treat or prevent blood clots An unusual or allergic reaction to obinutuzumab , other medications, foods, dyes, or preservatives Pregnant or trying to get pregnant Breastfeeding How should I use this medication? This medication is  for infusion into a vein. It is given by a care team in a hospital or clinic setting. Talk to your care team about the use of this medication in children. Special care may be needed. Overdosage: If you think you have taken too much of this medicine contact a poison control center or emergency room at once. NOTE: This medicine is only for you. Do not share this medicine with others. What if I miss a dose? Keep appointments for follow-up doses as directed. It is important not to miss your dose. Call your care team if you are unable to keep an appointment. What may interact with this medication? Live virus vaccines This list may not describe all possible interactions. Give your health care provider a list of all the medicines, herbs, non-prescription drugs, or dietary supplements you use. Also tell them if you smoke, drink alcohol, or use illegal drugs. Some items may interact with your medicine. What should I watch for while using this medication? Report any side effects that you notice during your treatment right away, such as changes in your breathing, fever, chills, dizziness or lightheadedness. These effects are more common with the first dose. Visit your care team for checks on your progress. You will need to have regular blood work. Report any other side effects. The side effects of this medication can continue after you finish your treatment. Continue your course of treatment even though you feel ill unless your care team tells you to stop. Call your care team for advice if you get a fever, chills or sore throat, or other symptoms of a cold or flu. Do not treat yourself. This medication decreases your body's ability to fight infections. Try to avoid being around people who are sick. This medication may increase your risk to bruise or bleed. Call your care team if you notice any unusual bleeding. Do not become pregnant while taking this medication or for 6 months after stopping it. Inform your care  team if you wish to become pregnant or think you might be pregnant. There is a potential for serious side effects to an unborn child. Talk to your care team or pharmacist for more information. Do not breast-feed an infant while taking this medication or for 6 months after stopping it. What side effects may I notice from receiving this medication? Side effects that you should report to your care team as soon as possible: Allergic reactions--skin rash, itching, hives, swelling of the face, lips, tongue, or throat Bleeding--bloody or black, tar-like stools, vomiting blood or brown material that looks like coffee grounds, red or dark brown urine, small red or purple spots on skin, unusual bruising or bleeding Blood clot--pain, swelling, or warmth in the leg, shortness of breath, chest pain Dizziness, loss of balance or coordination, confusion or trouble speaking Infection--fever, chills, cough, sore throat, wounds that don't heal, pain or trouble when passing urine, general feeling of discomfort or being unwell Infusion reactions--chest pain, shortness of breath or trouble breathing, feeling faint or lightheaded Liver injury--right upper belly pain, loss of appetite, nausea, light-colored stool, dark yellow or brown urine, yellowing skin or eyes, unusual weakness or fatigue Tumor lysis syndrome (TLS)--nausea, vomiting, diarrhea, decrease in the  amount of urine, dark urine, unusual weakness or fatigue, confusion, muscle pain or cramps, fast or irregular heartbeat, joint pain Side effects that usually do not require medical attention (report to your care team if they continue or are bothersome): Bone, joint, or muscle pain Constipation Diarrhea Fatigue Runny or stuffy nose Sore throat This list may not describe all possible side effects. Call your doctor for medical advice about side effects. You may report side effects to FDA at 1-800-FDA-1088. Where should I keep my medication? This medication is  only given in a hospital or clinic and will not be stored at home. NOTE: This sheet is a summary. It may not cover all possible information. If you have questions about this medicine, talk to your doctor, pharmacist, or health care provider.  2024 Elsevier/Gold Standard (2022-02-21 00:00:00)

## 2024-06-01 NOTE — Progress Notes (Signed)
 [facility] [phone]   [phone]  PROGRESS NOTE  Patient Care Team: Joshua Debby CROME, MD as PCP - General (Internal Medicine) Shlomo Wilbert SAUNDERS, MD as PCP - Sleep Medicine (Cardiology)  Hematological/Oncological History # CLL, Rai Stage 0. Del 11 01/25/2020: WBC 9.6, Hgb 15.6, MCV 79.1, Plt 241 05/29/2021: WBC 13.3, Hgb 15.4, MCV 76.9, Plt 203 11/16/2021: patient had routine CBC collected, showed WBC 15.9, Hgb 14.8, MCV 76.4, ALC 11.6 12/14/2021: establish care with Dr. Federico  05/24/2023: WBC 43.2, Hgb 14.6, MCV 77.8, Plt 217 11/25/2023: WBC 95.9, Hgb 15.4, MCV 77.8, Plt 221  01/13/2024: start of obintuzumab therapy. Cycle 1 Day 1  02/10/2024: Cycle 2 Day 1 of obinutuzumab . Start of venetoclax  03/09/2024: Cycle 3 Day 1 of obinutuzumab /Venetoclax  04/06/2024: Cycle 4 Day 1 of obinutuzumab /Venetoclax  05/04/2024: Cycle 5 Day 1 of obinutuzumab /Venetoclax  06/01/2024: Cycle 6 Day 1 of obinutuzumab /Venetoclax   Interval History:  James Parrish 58 y.o. male with medical history significant for CLL who presents for a follow up visit. The patient's last visit was on 05/04/2024.  In the interim, he denies any changes to his health.   On exam today James Parrish reports he has been well overall in interim since her last visit and is looking forward to this being his last infusion.  He reports he is tolerating his venetoclax  pills well and has only missed 1 dose since our last visit.  He reports he tries to take the medicine between noon and 3 PM.  He notes he is not having any trouble with nausea, vomiting, or diarrhea.  He reports that his bowel movements are regular but can be a little loose.  He reports he is having some fatigue but otherwise is able to work without difficulty.  He has been doing day drives rather than long-haul driving.  He does go to [place]  and back but is able to spend every night at home.  He did have a cold recently this took a little longer than usual to  shift.  Overall he feels well and is willing and able to proceed with his infusion and p.o. medications today.  MEDICAL HISTORY:  Past Medical History:  Diagnosis Date   CLL (chronic lymphocytic leukemia) (HCC)    Complication of anesthesia    hard to wake up    Diabetes mellitus without complication [facility])    ED (erectile dysfunction)    History of COVID-19 04/2019   Hyperlipidemia    Hypertension    Sleep apnea    uses CPAP nightly    SURGICAL HISTORY: Past Surgical History:  Procedure Laterality Date   BRONCHIAL NEEDLE ASPIRATION BIOPSY  05/29/2021   Procedure: BRONCHIAL NEEDLE ASPIRATION BIOPSIES;  Surgeon: Brenna Adine CROME, DO;  Location: [facility];  Service: Pulmonary;;   COLONOSCOPY     finger surgery Right    Index   KNEE SURGERY Left    KNEE SURGERY Right    ORIF PATELLA Right    REVERSE SHOULDER ARTHROPLASTY Right 08/31/2020   Procedure: REVERSE SHOULDER ARTHROPLASTY;  Surgeon: Cristy Bonner DASEN, MD;  Location: [facility];  Service: Orthopedics;  Laterality: Right;   SHOULDER ARTHROSCOPY WITH ROTATOR CUFF REPAIR Left 02/04/2020   Procedure: SHOULDER ARTHROSCOPY WITH ROTATOR CUFF REPAIR WITH BICEP TENODESIS;  Surgeon: Cristy Bonner DASEN, MD;  Location: [facility];  Service: Orthopedics;  Laterality: Left;   VIDEO BRONCHOSCOPY WITH ENDOBRONCHIAL ULTRASOUND Left 05/29/2021   Procedure: VIDEO BRONCHOSCOPY WITH ENDOBRONCHIAL ULTRASOUND;  Surgeon: Brenna Adine  L, DO;  Location: [facility];  Service: Pulmonary;  Laterality: Left;    SOCIAL HISTORY: Social History   Socioeconomic History   Marital status: Married    Spouse name: Not on file   Number of children: Not on file   Years of education: Not on file   Highest education level: 12th grade  Occupational History   Not on file  Tobacco Use   Smoking status: Former    Current packs/day: 0.00    Average packs/day: 1.5 packs/day for 25.0 years (37.5 ttl pk-yrs)    Types: Cigarettes     Start date: 04/1995    Quit date: 04/2020    Years since quitting: 4.1    Passive exposure: Past   Smokeless tobacco: Never  Vaping Use   Vaping status: Never Used  Substance and Sexual Activity   Alcohol use: Not Currently    Comment: occasionally   Drug use: Never   Sexual activity: Yes    Partners: Female  Other Topics Concern   Not on file  Social History Narrative   Not on file   Social Drivers of Health   Financial Resource Strain: High Risk (12/28/2023)   Overall Financial Resource Strain (CARDIA)    Difficulty of Paying Living Expenses: Very hard  Food Insecurity: Food Insecurity Present (12/28/2023)   Hunger Vital Sign    Worried About Running Out of Food in the Last Year: Often true    Ran Out of Food in the Last Year: Often true  Transportation Needs: No Transportation Needs (12/28/2023)   PRAPARE - Administrator, Civil Service (Medical): No    Lack of Transportation (Non-Medical): No  Physical Activity: Insufficiently Active (12/28/2023)   Exercise Vital Sign    Days of Exercise per Week: 3 days    Minutes of Exercise per Session: 10 min  Stress: Stress Concern Present (12/28/2023)   [employer] of Occupational Health - Occupational Stress Questionnaire    Feeling of Stress : To some extent  Social Connections: Socially Isolated (12/28/2023)   Social Connection and Isolation Panel    Frequency of Communication with Friends and Family: Twice a week    Frequency of Social Gatherings with Friends and Family: Never    Attends Religious Services: Never    Diplomatic Services operational officer: No    Attends Engineer, structural: Not on file    Marital Status: Married  Catering manager Violence: Not on file    FAMILY HISTORY: Family History  Problem Relation Age of Onset   Cancer Mother        breast cancer    Hypertension Mother    Diabetes Mother    Venous thrombosis Son    Colon cancer Neg Hx    Esophageal cancer Neg Hx     Stomach cancer Neg Hx    Rectal cancer Neg Hx     ALLERGIES:  is allergic to gazyva  [obinutuzumab ].  MEDICATIONS:  Current Outpatient Medications  Medication Sig Dispense Refill   allopurinol  (ZYLOPRIM ) 300 MG tablet Take 1 tablet (300 mg total) by mouth daily. 30 tablet 3   amLODipine  (NORVASC ) 10 MG tablet TAKE 1 TABLET BY MOUTH AT BEDTIME 90 tablet 1   atorvastatin  (LIPITOR) 10 MG tablet TAKE 1 TABLET BY MOUTH DAILY 30 tablet 3   dapagliflozin  propanediol (FARXIGA ) 10 MG TABS tablet Take 1 tablet (10 mg total) by mouth daily before breakfast. 90 tablet 3   diclofenac (VOLTAREN) 75 MG EC tablet  Take 75 mg by mouth 2 (two) times daily.     glucose blood test strip Use as instructed to test blood sugar 2 times daily E11.65    Onetouch strips 100 each 12   indapamide  (LOZOL ) 1.25 MG tablet TAKE 1 TABLET BY MOUTH DAILY 90 tablet 0   insulin  glargine (LANTUS  SOLOSTAR) 100 UNIT/ML Solostar Pen Inject 30 Units into the skin daily. 30 mL 6   Insulin  Pen Needle 29G X MISC 1 Device by Does not apply route daily in the afternoon. 100 each 3   losartan  (COZAAR ) 100 MG tablet TAKE 1 TABLET BY MOUTH DAILY 90 tablet 1   metFORMIN  (GLUCOPHAGE ) 1000 MG tablet Take 1 tablet (1,000 mg total) by mouth 2 (two) times daily with a meal. 180 tablet 3   Multiple Vitamin (MULTIVITAMIN WITH MINERALS) TABS tablet Take 1 tablet by mouth daily.     nebivolol  (BYSTOLIC ) 10 MG tablet TAKE 1 TABLET BY MOUTH DAILY 90 tablet 1   ondansetron  (ZOFRAN ) 8 MG tablet Take 1 tablet (8 mg total) by mouth every 8 (eight) hours as needed for nausea or vomiting. 20 tablet 2   oseltamivir  (TAMIFLU ) 75 MG capsule Take 1 capsule (75 mg total) by mouth 2 (two) times daily. (Patient not taking: Reported on 05/18/2024) 10 capsule 0   prochlorperazine  (COMPAZINE ) 10 MG tablet Take 1 tablet (10 mg total) by mouth every 6 (six) hours as needed for nausea or vomiting. 30 tablet 2   promethazine -dextromethorphan (PROMETHAZINE -DM) 6.25-15  MG/5ML syrup Take 5 mLs by mouth 3 (three) times daily as needed for cough. 200 mL 0   Semaglutide , 2 MG/DOSE, (OZEMPIC , 2 MG/DOSE,) 8 MG/3ML SOPN DIAL AND INJECT UNDER THE SKIN 2 MG WEEKLY 3 mL 1   spironolactone  (ALDACTONE ) 50 MG tablet TAKE 1 TABLET BY MOUTH DAILY 90 tablet 0   tadalafil  (CIALIS ) 20 MG tablet TAKE 1 TABLET BY MOUTH DAILY AS NEEDED FOR ERECTILE DYSFUNCTION 10 tablet 2   venetoclax  (VENCLEXTA ) 100 MG tablet Take 4 tablets (400 mg total) by mouth daily. Tablets should be swallowed whole with a meal and a full glass of water. 112 tablet 0   No current facility-administered medications for this visit.   Facility-Administered Medications Ordered in Other Visits  Medication Dose Route Frequency Provider Last Rate Last Admin   0.9 %  sodium chloride  infusion   Intravenous Continuous James Norleen ONEIDA MADISON, MD 10 mL/hr at 06/01/24 1131 New Bag at 06/01/24 1131   dexamethasone  (DECADRON ) 20 mg in sodium chloride  0.9 % 50 mL IVPB  20 mg Intravenous Once Dawnielle Christiana T IV, MD       famotidine  (PEPCID ) IVPB 20 mg premix  20 mg Intravenous Once Jacey Eckerson T IV, MD       obinutuzumab  (GAZYVA ) 1,000 mg in sodium chloride  0.9 % 250 mL (3.4483 mg/mL) chemo infusion  1,000 mg Intravenous Once Zacari Stiff T IV, MD        REVIEW OF SYSTEMS:   Constitutional: ( - ) fevers, ( - )  chills , ( - ) night sweats Eyes: ( - ) blurriness of vision, ( - ) double vision, ( - ) watery eyes Ears, nose, mouth, throat, and face: ( - ) mucositis, ( - ) sore throat Respiratory: ( - ) cough, ( - ) dyspnea, ( - ) wheezes Cardiovascular: ( - ) palpitation, ( - ) chest discomfort, ( - ) lower extremity swelling Gastrointestinal:  ( - ) nausea, ( - ) heartburn, ( - )  change in bowel habits Skin: ( - ) abnormal skin rashes Lymphatics: ( - ) new lymphadenopathy, ( - ) easy bruising Neurological: ( - ) numbness, ( - ) tingling, ( - ) new weaknesses Behavioral/Psych: ( - ) mood change, ( - ) new changes  All other  systems were reviewed with the patient and are negative.  PHYSICAL EXAMINATION: ECOG PERFORMANCE STATUS: 0 - Asymptomatic  Vitals:   06/01/24 1046  BP: (!) 134/92  Pulse: 92  Resp: 18  Temp: 98.4 F (36.9 C)  SpO2: 97%    Filed Weights   06/01/24 1046  Weight: 269 lb 12.8 oz (122.4 kg)     GENERAL: Well-appearing middle-aged African-American male alert, no distress and comfortable SKIN: skin color, texture, turgor are normal, no rashes or significant lesions EYES: conjunctiva are pink and non-injected, sclera clear NECK: supple, non-tender LYMPH: Mildly prominent left-sided cervical lymph nodes, no other overt lymphadenopathy noted on exam LUNGS: clear to auscultation and percussion with normal breathing effort HEART: regular rate & rhythm and no murmurs and no lower extremity edema Musculoskeletal: no cyanosis of digits and no clubbing  PSYCH: alert & oriented x 3, fluent speech NEURO: no focal motor/sensory deficits  LABORATORY DATA:  I have reviewed the data as listed    Latest Ref Rng & Units 06/01/2024   10:22 AM 05/04/2024    9:40 AM 04/06/2024   11:08 AM  CBC  WBC 4.0 - 10.5 K/uL 3.7  5.9  4.3   Hemoglobin 13.0 - 17.0 g/dL 84.5  84.4  84.8   Hematocrit 39.0 - 52.0 % 47.2  46.9  46.1   Platelets 150 - 400 K/uL 209  157  244        Latest Ref Rng & Units 06/01/2024   10:22 AM 05/04/2024    9:40 AM 04/06/2024   11:08 AM  CMP  Glucose 70 - 99 mg/dL 854  873  846   BUN 6 - 20 mg/dL 12  13  14    Creatinine 0.61 - 1.24 mg/dL 9.10  9.05  9.08   Sodium 135 - 145 mmol/L 141  141  142   Potassium 3.5 - 5.1 mmol/L 3.8  3.7  3.5   Chloride 98 - 111 mmol/L 107  110  108   CO2 22 - 32 mmol/L 24  23  24    Calcium  8.9 - 10.3 mg/dL 9.2  9.1  9.5   Total Protein 6.5 - 8.1 g/dL 7.0  7.1  7.2   Total Bilirubin 0.0 - 1.2 mg/dL 0.3  0.5  0.4   Alkaline Phos 38 - 126 U/L 55  55  58   AST 15 - 41 U/L 20  15  14    ALT 0 - 44 U/L 18  14  12      RADIOGRAPHIC STUDIES:  No  results found.  ASSESSMENT & PLAN BRIGHAM COBBINS 58 y.o. male with medical history significant for CLL who presents for a follow up visit.   Due to progression of disease with rapidly rising white blood cell count the patient was started on obinutuzumab /venetoclax  therapy.  This was after discussion of different options including acalabrutinib/zanubrutinib.  The patient preferred the limited duration therapy.  # CLL, Rai Stage 0 --Due to rapidly rising white blood cell count patient was started on obinutuzumab /venetoclax .  Cycle 1 day 1 was on 01/13/2024. Obinutuzmab to continue x 6 months.  --Patient has started therapy and is tolerating it well. --Labs show white blood cell  count 3.7, Hgb 15.4, MCV 75.6, Plt 209. ANC 1.2.  Cr 0.89 -- Venetoclax  added with cycle 2. Plan to continue x 12 months.  --RTC in 8 weeks with interval 4 week labs.    No orders of the defined types were placed in this encounter.   All questions were answered. The patient knows to call the clinic with any problems, questions or concerns.  I have spent a total of 30 minutes minutes of face-to-face and non-face-to-face time, preparing to see the patient,  performing a medically appropriate examination, counseling and educating the patient, documenting clinical information in the electronic health record, and care coordination.   Norleen IVAR Kidney, MD Department of Hematology/Oncology [facility] at [facility] Phone: [phone] Pager: [phone] Email: [email or web address]   06/01/2024 11:35 AM  Shermon CHRISTELLA Hearing BD, Catovsky D, Caligaris-Cappio F, Dighiero G, Dhner H, Hillmen P, Keating M, Montserrat E, Chiorazzi N, Stilgenbauer S, Rai KR, [place], Eichhorst B, O'Brien S, Robak T, Seymour JF, Kipps TJ. iwCLL guidelines for diagnosis, indications for treatment, response assessment, and supportive management of CLL. Blood. 2018 Jun 21;131(25):2745-2760.  Active disease should be  clearly documented to initiate therapy. At least 1 of the following criteria should be met.  1) Evidence of progressive marrow failure as manifested by the development of, or worsening of, anemia and/or thrombocytopenia. Cutoff levels of Hb <10 g/dL or platelet counts <899  109/L are generally regarded as indication for treatment. However, in some patients, platelet counts <100  109/L may remain stable over a long period; this situation does not automatically require therapeutic intervention. 2) Massive (ie, >=6 cm below the left costal margin) or progressive or symptomatic splenomegaly. 3) Massive nodes (ie, >=10 cm in longest diameter) or progressive or symptomatic lymphadenopathy. 4) Progressive lymphocytosis with an increase of >=50% over a 60-month period, or lymphocyte doubling time (LDT) <6 months. LDT can be obtained by linear regression extrapolation of absolute lymphocyte counts obtained at intervals of 2 weeks over an observation period of 2 to 3 months; patients with initial blood lymphocyte counts <30  109/L may require a longer observation period to determine the LDT. Factors contributing to lymphocytosis other than CLL (eg, infections, steroid administration) should be excluded. 5) Autoimmune complications including anemia or thrombocytopenia poorly responsive to corticosteroids. 6) Symptomatic or functional extranodal involvement (eg, skin, kidney, lung, spine). Disease-related symptoms as defined by any of the following: Unintentional weight loss >=10% within the previous 6 months. Significant fatigue (ie, ECOG performance scale 2 or worse; cannot work or unable to perform usual activities). Fevers >=100.57F or 38.0C for 2 or more weeks without evidence of infection. Night sweats for >=1 month without evidence of infection.

## 2024-06-03 ENCOUNTER — Other Ambulatory Visit: Payer: Self-pay

## 2024-06-04 ENCOUNTER — Other Ambulatory Visit: Payer: Self-pay

## 2024-06-04 ENCOUNTER — Other Ambulatory Visit: Payer: Self-pay | Admitting: Internal Medicine

## 2024-06-04 DIAGNOSIS — I10 Essential (primary) hypertension: Secondary | ICD-10-CM

## 2024-06-17 ENCOUNTER — Other Ambulatory Visit: Payer: Self-pay | Admitting: Hematology and Oncology

## 2024-06-17 ENCOUNTER — Other Ambulatory Visit: Payer: Self-pay

## 2024-06-17 DIAGNOSIS — C9110 Chronic lymphocytic leukemia of B-cell type not having achieved remission: Secondary | ICD-10-CM

## 2024-06-18 ENCOUNTER — Other Ambulatory Visit: Payer: Self-pay

## 2024-06-18 MED ORDER — VENETOCLAX 100 MG PO TABS
400.0000 mg | ORAL_TABLET | Freq: Every day | ORAL | 0 refills | Status: DC
  Filled 2024-06-18 – 2024-06-19 (×2): qty 112, 28d supply, fill #0

## 2024-06-19 ENCOUNTER — Other Ambulatory Visit (HOSPITAL_COMMUNITY): Payer: Self-pay

## 2024-06-19 ENCOUNTER — Other Ambulatory Visit: Payer: Self-pay | Admitting: Pharmacy Technician

## 2024-06-19 ENCOUNTER — Other Ambulatory Visit: Payer: Self-pay

## 2024-06-19 NOTE — Progress Notes (Signed)
 Specialty Pharmacy Refill Coordination Note  James Parrish is a 58 y.o. male contacted today regarding refills of specialty medication(s) Venetoclax  (VENCLEXTA )  Spoke with wife  Patient requested Delivery   Delivery date: 06/25/24   Verified address: [address] [place]   Medication will be filled on 06/24/24.

## 2024-06-24 ENCOUNTER — Other Ambulatory Visit: Payer: Self-pay

## 2024-06-29 ENCOUNTER — Inpatient Hospital Stay: Attending: Hematology and Oncology

## 2024-06-29 DIAGNOSIS — C9110 Chronic lymphocytic leukemia of B-cell type not having achieved remission: Secondary | ICD-10-CM | POA: Diagnosis present

## 2024-06-29 LAB — CBC WITH DIFFERENTIAL (CANCER CENTER ONLY)
Abs Immature Granulocytes: 0.07 K/uL (ref 0.00–0.07)
Basophils Absolute: 0 K/uL (ref 0.0–0.1)
Basophils Relative: 1 %
Eosinophils Absolute: 0 K/uL (ref 0.0–0.5)
Eosinophils Relative: 0 %
HCT: 45.6 % (ref 39.0–52.0)
Hemoglobin: 15 g/dL (ref 13.0–17.0)
Immature Granulocytes: 1 %
Lymphocytes Relative: 39 %
Lymphs Abs: 1.9 K/uL (ref 0.7–4.0)
MCH: 24.5 pg — ABNORMAL LOW (ref 26.0–34.0)
MCHC: 32.9 g/dL (ref 30.0–36.0)
MCV: 74.4 fL — ABNORMAL LOW (ref 80.0–100.0)
Monocytes Absolute: 0.4 K/uL (ref 0.1–1.0)
Monocytes Relative: 8 %
Neutro Abs: 2.5 K/uL (ref 1.7–7.7)
Neutrophils Relative %: 51 %
Platelet Count: 187 K/uL (ref 150–400)
RBC: 6.13 MIL/uL — ABNORMAL HIGH (ref 4.22–5.81)
RDW: 16.4 % — ABNORMAL HIGH (ref 11.5–15.5)
WBC Count: 4.9 K/uL (ref 4.0–10.5)
nRBC: 0 % (ref 0.0–0.2)

## 2024-06-29 LAB — LACTATE DEHYDROGENASE: LDH: 175 U/L (ref 98–192)

## 2024-06-29 LAB — URIC ACID: Uric Acid, Serum: 4.8 mg/dL (ref 3.7–8.6)

## 2024-06-29 LAB — CMP (CANCER CENTER ONLY)
ALT: 18 U/L (ref 0–44)
AST: 19 U/L (ref 15–41)
Albumin: 4.6 g/dL (ref 3.5–5.0)
Alkaline Phosphatase: 50 U/L (ref 38–126)
Anion gap: 7 (ref 5–15)
BUN: 19 mg/dL (ref 6–20)
CO2: 25 mmol/L (ref 22–32)
Calcium: 9 mg/dL (ref 8.9–10.3)
Chloride: 108 mmol/L (ref 98–111)
Creatinine: 1.05 mg/dL (ref 0.61–1.24)
GFR, Estimated: >60 mL/min (ref >60)
Glucose, Bld: 143 mg/dL — ABNORMAL HIGH (ref 70–99)
Potassium: 3.7 mmol/L (ref 3.5–5.1)
Sodium: 140 mmol/L (ref 135–145)
Total Bilirubin: 0.3 mg/dL (ref 0.0–1.2)
Total Protein: 7 g/dL (ref 6.5–8.1)

## 2024-07-06 ENCOUNTER — Other Ambulatory Visit (HOSPITAL_COMMUNITY): Payer: Self-pay

## 2024-07-06 ENCOUNTER — Ambulatory Visit: Admitting: Internal Medicine

## 2024-07-06 ENCOUNTER — Ambulatory Visit (INDEPENDENT_AMBULATORY_CARE_PROVIDER_SITE_OTHER)

## 2024-07-06 ENCOUNTER — Encounter: Payer: Self-pay | Admitting: Internal Medicine

## 2024-07-06 ENCOUNTER — Ambulatory Visit: Payer: Self-pay | Admitting: Internal Medicine

## 2024-07-06 VITALS — BP 116/80 | HR 95 | Temp 97.2°F | Resp 16 | Ht 74.0 in | Wt 265.0 lb

## 2024-07-06 DIAGNOSIS — I10 Essential (primary) hypertension: Secondary | ICD-10-CM | POA: Diagnosis not present

## 2024-07-06 DIAGNOSIS — R9431 Abnormal electrocardiogram [ECG] [EKG]: Secondary | ICD-10-CM

## 2024-07-06 DIAGNOSIS — R10A1 Flank pain, right side: Secondary | ICD-10-CM | POA: Insufficient documentation

## 2024-07-06 DIAGNOSIS — R109 Unspecified abdominal pain: Secondary | ICD-10-CM

## 2024-07-06 DIAGNOSIS — R748 Abnormal levels of other serum enzymes: Secondary | ICD-10-CM | POA: Insufficient documentation

## 2024-07-06 DIAGNOSIS — Z23 Encounter for immunization: Secondary | ICD-10-CM | POA: Insufficient documentation

## 2024-07-06 DIAGNOSIS — G8929 Other chronic pain: Secondary | ICD-10-CM

## 2024-07-06 DIAGNOSIS — E785 Hyperlipidemia, unspecified: Secondary | ICD-10-CM

## 2024-07-06 DIAGNOSIS — R0609 Other forms of dyspnea: Secondary | ICD-10-CM

## 2024-07-06 DIAGNOSIS — Z125 Encounter for screening for malignant neoplasm of prostate: Secondary | ICD-10-CM | POA: Insufficient documentation

## 2024-07-06 DIAGNOSIS — B356 Tinea cruris: Secondary | ICD-10-CM

## 2024-07-06 LAB — URINALYSIS, ROUTINE W REFLEX MICROSCOPIC
Bilirubin Urine: NEGATIVE
Hgb urine dipstick: NEGATIVE
Ketones, ur: NEGATIVE
Leukocytes,Ua: NEGATIVE
Nitrite: NEGATIVE
Specific Gravity, Urine: 1.015 (ref 1.000–1.030)
Total Protein, Urine: NEGATIVE
Urine Glucose: >=1000 — AB
Urobilinogen, UA: 1 (ref 0.0–1.0)
pH: 6 (ref 5.0–8.0)

## 2024-07-06 LAB — TSH: TSH: 1.84 u[IU]/mL (ref 0.35–5.50)

## 2024-07-06 LAB — LIPID PANEL
Cholesterol: 116 mg/dL (ref 0–200)
HDL: 28.7 mg/dL — ABNORMAL LOW (ref >39.00)
LDL Cholesterol: 41 mg/dL (ref 0–99)
NonHDL: 87.66
Total CHOL/HDL Ratio: 4
Triglycerides: 232 mg/dL — ABNORMAL HIGH (ref 0.0–149.0)
VLDL: 46.4 mg/dL — ABNORMAL HIGH (ref 0.0–40.0)

## 2024-07-06 LAB — LIPASE: Lipase: 67 U/L — ABNORMAL HIGH (ref 11.0–59.0)

## 2024-07-06 LAB — PSA: PSA: 0.51 ng/mL (ref 0.10–4.00)

## 2024-07-06 NOTE — Patient Instructions (Signed)
 Hypertension, Adult High blood pressure (hypertension) is when the force of blood pumping through the arteries is too strong. The arteries are the blood vessels that carry blood from the heart throughout the body. Hypertension forces the heart to work harder to pump blood and may cause arteries to become narrow or stiff. Untreated or uncontrolled hypertension can lead to a heart attack, heart failure, a stroke, kidney disease, and other problems. A blood pressure reading consists of a higher number over a lower number. Ideally, your blood pressure should be below 120/80. The first ("top") number is called the systolic pressure. It is a measure of the pressure in your arteries as your heart beats. The second ("bottom") number is called the diastolic pressure. It is a measure of the pressure in your arteries as the heart relaxes. What are the causes? The exact cause of this condition is not known. There are some conditions that result in high blood pressure. What increases the risk? Certain factors may make you more likely to develop high blood pressure. Some of these risk factors are under your control, including: Smoking. Not getting enough exercise or physical activity. Being overweight. Having too much fat, sugar, calories, or salt (sodium) in your diet. Drinking too much alcohol. Other risk factors include: Having a personal history of heart disease, diabetes, high cholesterol, or kidney disease. Stress. Having a family history of high blood pressure and high cholesterol. Having obstructive sleep apnea. Age. The risk increases with age. What are the signs or symptoms? High blood pressure may not cause symptoms. Very high blood pressure (hypertensive crisis) may cause: Headache. Fast or irregular heartbeats (palpitations). Shortness of breath. Nosebleed. Nausea and vomiting. Vision changes. Severe chest pain, dizziness, and seizures. How is this diagnosed? This condition is diagnosed by  measuring your blood pressure while you are seated, with your arm resting on a flat surface, your legs uncrossed, and your feet flat on the floor. The cuff of the blood pressure monitor will be placed directly against the skin of your upper arm at the level of your heart. Blood pressure should be measured at least twice using the same arm. Certain conditions can cause a difference in blood pressure between your right and left arms. If you have a high blood pressure reading during one visit or you have normal blood pressure with other risk factors, you may be asked to: Return on a different day to have your blood pressure checked again. Monitor your blood pressure at home for 1 week or longer. If you are diagnosed with hypertension, you may have other blood or imaging tests to help your health care provider understand your overall risk for other conditions. How is this treated? This condition is treated by making healthy lifestyle changes, such as eating healthy foods, exercising more, and reducing your alcohol intake. You may be referred for counseling on a healthy diet and physical activity. Your health care provider may prescribe medicine if lifestyle changes are not enough to get your blood pressure under control and if: Your systolic blood pressure is above 130. Your diastolic blood pressure is above 80. Your personal target blood pressure may vary depending on your medical conditions, your age, and other factors. Follow these instructions at home: Eating and drinking  Eat a diet that is high in fiber and potassium, and low in sodium, added sugar, and fat. An example of this eating plan is called the DASH diet. DASH stands for Dietary Approaches to Stop Hypertension. To eat this way: Eat  plenty of fresh fruits and vegetables. Try to fill one half of your plate at each meal with fruits and vegetables. Eat whole grains, such as whole-wheat pasta, brown rice, or whole-grain bread. Fill about one  fourth of your plate with whole grains. Eat or drink low-fat dairy products, such as skim milk or low-fat yogurt. Avoid fatty cuts of meat, processed or cured meats, and poultry with skin. Fill about one fourth of your plate with lean proteins, such as fish, chicken without skin, beans, eggs, or tofu. Avoid pre-made and processed foods. These tend to be higher in sodium, added sugar, and fat. Reduce your daily sodium intake. Many people with hypertension should eat less than 1,500 mg of sodium a day. Do not drink alcohol if: Your health care provider tells you not to drink. You are pregnant, may be pregnant, or are planning to become pregnant. If you drink alcohol: Limit how much you have to: 0-1 drink a day for women. 0-2 drinks a day for men. Know how much alcohol is in your drink. In the [place] one drink equals one 12 oz bottle of beer (355 mL), one 5 oz glass of wine (148 mL), or one 1 oz glass of hard liquor (44 mL). Lifestyle  Work with your health care provider to maintain a healthy body weight or to lose weight. Ask what an ideal weight is for you. Get at least 30 minutes of exercise that causes your heart to beat faster (aerobic exercise) most days of the week. Activities may include walking, swimming, or biking. Include exercise to strengthen your muscles (resistance exercise), such as Pilates or lifting weights, as part of your weekly exercise routine. Try to do these types of exercises for 30 minutes at least 3 days a week. Do not use any products that contain nicotine or tobacco. These products include cigarettes, chewing tobacco, and vaping devices, such as e-cigarettes. If you need help quitting, ask your health care provider. Monitor your blood pressure at home as told by your health care provider. Keep all follow-up visits. This is important. Medicines Take over-the-counter and prescription medicines only as told by your health care provider. Follow directions carefully. Blood  pressure medicines must be taken as prescribed. Do not skip doses of blood pressure medicine. Doing this puts you at risk for problems and can make the medicine less effective. Ask your health care provider about side effects or reactions to medicines that you should watch for. Contact a health care provider if you: Think you are having a reaction to a medicine you are taking. Have headaches that keep coming back (recurring). Feel dizzy. Have swelling in your ankles. Have trouble with your vision. Get help right away if you: Develop a severe headache or confusion. Have unusual weakness or numbness. Feel faint. Have severe pain in your chest or abdomen. Vomit repeatedly. Have trouble breathing. These symptoms may be an emergency. Get help right away. Call 911. Do not wait to see if the symptoms will go away. Do not drive yourself to the hospital. Summary Hypertension is when the force of blood pumping through your arteries is too strong. If this condition is not controlled, it may put you at risk for serious complications. Your personal target blood pressure may vary depending on your medical conditions, your age, and other factors. For most people, a normal blood pressure is less than 120/80. Hypertension is treated with lifestyle changes, medicines, or a combination of both. Lifestyle changes include losing weight, eating a healthy,  low-sodium diet, exercising more, and limiting alcohol. This information is not intended to replace advice given to you by your health care provider. Make sure you discuss any questions you have with your health care provider. Document Revised: 08/08/2021 Document Reviewed: 08/08/2021 Elsevier Patient Education  2024 [employer].

## 2024-07-06 NOTE — Progress Notes (Unsigned)
 Subjective:  Patient ID: James Parrish, male    DOB: 1966/09/04  Age: 58 y.o. MRN: [id number]  CC: Hypertension   HPI James Parrish presents for f/up ----  Discussed the use of AI scribe software for clinical note transcription with the patient, who gave verbal consent to proceed.  History of Present Illness James Parrish is a 58 year old male with chronic lymphocytic leukemia who presents with shortness of breath and right flank pain.  He has completed intensive chemotherapy and is currently on oral chemotherapy for chronic lymphocytic leukemia. He generally feels well but experiences occasional shortness of breath, particularly during physical exertion. This symptom has been present since undergoing chemotherapy treatments.  He reports a new onset of right flank pain that began approximately two months ago. The pain occurs during climax during intercourse and sometimes while sitting or driving. No urinary symptoms such as hematuria, dysuria, or increased frequency. No issues with bowel movements, including no hematochezia or constipation.  His recent A1c level was 7.1%, which he attributes to steroid use. He recalls his last colonoscopy was about four years ago, and his last eye exam was more than six months ago at [facility]  No chest pain, hematuria, dysuria, frequent urination, hematochezia, or constipation.     Outpatient Medications Prior to Visit  Medication Sig Dispense Refill   allopurinol  (ZYLOPRIM ) 300 MG tablet Take 1 tablet (300 mg total) by mouth daily. 30 tablet 3   amLODipine  (NORVASC ) 10 MG tablet TAKE 1 TABLET BY MOUTH AT BEDTIME 90 tablet 1   atorvastatin  (LIPITOR) 10 MG tablet TAKE 1 TABLET BY MOUTH DAILY 30 tablet 3   dapagliflozin  propanediol (FARXIGA ) 10 MG TABS tablet Take 1 tablet (10 mg total) by mouth daily before breakfast. 90 tablet 3   diclofenac (VOLTAREN) 75 MG EC tablet Take 75 mg by mouth 2 (two) times daily.     glucose blood test strip Use  as instructed to test blood sugar 2 times daily E11.65    Onetouch strips 100 each 12   insulin  glargine (LANTUS  SOLOSTAR) 100 UNIT/ML Solostar Pen Inject 30 Units into the skin daily. 30 mL 6   Insulin  Pen Needle 29G X MISC 1 Device by Does not apply route daily in the afternoon. 100 each 3   losartan  (COZAAR ) 100 MG tablet TAKE 1 TABLET BY MOUTH DAILY 90 tablet 1   metFORMIN  (GLUCOPHAGE ) 1000 MG tablet Take 1 tablet (1,000 mg total) by mouth 2 (two) times daily with a meal. 180 tablet 3   Multiple Vitamin (MULTIVITAMIN WITH MINERALS) TABS tablet Take 1 tablet by mouth daily.     nebivolol  (BYSTOLIC ) 10 MG tablet TAKE 1 TABLET BY MOUTH DAILY 90 tablet 1   Semaglutide , 2 MG/DOSE, (OZEMPIC , 2 MG/DOSE,) 8 MG/3ML SOPN DIAL AND INJECT UNDER THE SKIN 2 MG WEEKLY 3 mL 1   spironolactone  (ALDACTONE ) 50 MG tablet TAKE 1 TABLET BY MOUTH DAILY 90 tablet 0   tadalafil  (CIALIS ) 20 MG tablet TAKE 1 TABLET BY MOUTH DAILY AS NEEDED FOR ERECTILE DYSFUNCTION 10 tablet 2   venetoclax  (VENCLEXTA ) 100 MG tablet Take 4 tablets (400 mg total) by mouth daily. Tablets should be swallowed whole with a meal and a full glass of water. 112 tablet 0   indapamide  (LOZOL ) 1.25 MG tablet TAKE 1 TABLET BY MOUTH DAILY 90 tablet 0   ondansetron  (ZOFRAN ) 8 MG tablet Take 1 tablet (8 mg total) by mouth every 8 (eight) hours as needed  for nausea or vomiting. (Patient not taking: Reported on 07/06/2024) 20 tablet 2   oseltamivir  (TAMIFLU ) 75 MG capsule Take 1 capsule (75 mg total) by mouth 2 (two) times daily. (Patient not taking: Reported on 05/18/2024) 10 capsule 0   prochlorperazine  (COMPAZINE ) 10 MG tablet Take 1 tablet (10 mg total) by mouth every 6 (six) hours as needed for nausea or vomiting. (Patient not taking: Reported on 07/06/2024) 30 tablet 2   promethazine -dextromethorphan (PROMETHAZINE -DM) 6.25-15 MG/5ML syrup Take 5 mLs by mouth 3 (three) times daily as needed for cough. 200 mL 0   No facility-administered medications  prior to visit.    ROS Review of Systems  Respiratory:  Positive for shortness of breath.   Genitourinary:  Positive for flank pain.  Hematological:  Negative for adenopathy. Does not bruise/bleed easily.  Psychiatric/Behavioral: Negative.      Objective:  BP 116/80 (BP Location: Left Arm, Patient Position: Sitting, Cuff Size: Normal)   Pulse 95   Temp (!) 97.2 F (36.2 C) (Oral)   Resp 16   Ht 6' 2 (1.88 m)   Wt 265 lb (120.2 kg)   SpO2 95%   BMI 34.02 kg/m   BP Readings from Last 3 Encounters:  07/06/24 116/80  06/01/24 128/89  06/01/24 (!) 134/92    Wt Readings from Last 3 Encounters:  07/06/24 265 lb (120.2 kg)  06/01/24 269 lb 12.8 oz (122.4 kg)  05/18/24 271 lb (122.9 kg)    Physical Exam Vitals reviewed.  Constitutional:      Appearance: Normal appearance.  HENT:     Nose: Nose normal.     Mouth/Throat:     Mouth: Mucous membranes are moist.  Eyes:     General: No scleral icterus.    Conjunctiva/sclera: Conjunctivae normal.  Cardiovascular:     Rate and Rhythm: Normal rate and regular rhythm.     Heart sounds: No murmur heard.    No friction rub. No gallop.     Comments: EKG -- NSR, 88 bpm NS ST/T wave changes Isolated Q in III Unchanged  Pulmonary:     Effort: Pulmonary effort is normal.     Breath sounds: No stridor. No wheezing, rhonchi or rales.  Abdominal:     General: Abdomen is protuberant. Bowel sounds are normal. There is no distension.     Palpations: Abdomen is soft. There is no hepatomegaly, splenomegaly or mass.     Tenderness: There is no abdominal tenderness. There is no guarding.     Hernia: No hernia is present. There is no hernia in the left inguinal area or right inguinal area.  Genitourinary:    Pubic Area: Rash present.     Penis: Normal and circumcised.      Testes: Normal.        Left: Undescended: scaling and hyperpigmentation.     Epididymis:     Right: Normal.     Left: Normal.     Prostate: Normal. Not  enlarged, not tender and no nodules present.     Rectum: Normal. Guaiac result negative. No mass, tenderness, anal fissure, external hemorrhoid or internal hemorrhoid. Normal anal tone.        Comments: Scaling and hyperpigmentation Musculoskeletal:        General: Normal range of motion.     Cervical back: Neck supple.     Right lower leg: No edema.     Left lower leg: No edema.  Lymphadenopathy:     Cervical: No cervical adenopathy.  Lower Body: No right inguinal adenopathy. No left inguinal adenopathy.  Skin:    General: Skin is warm.     Coloration: Skin is not jaundiced or pale.     Findings: Rash present. No erythema or lesion.  Neurological:     General: No focal deficit present.     Mental Status: He is alert. Mental status is at baseline.  Psychiatric:        Mood and Affect: Mood normal.        Behavior: Behavior normal.     Lab Results  Component Value Date   WBC 4.9 06/29/2024   HGB 15.0 06/29/2024   HCT 45.6 06/29/2024   PLT 187 06/29/2024   GLUCOSE 143 (H) 06/29/2024   CHOL 116 07/06/2024   TRIG 232.0 (H) 07/06/2024   HDL 28.70 (L) 07/06/2024   LDLDIRECT 63.0 11/01/2022   LDLCALC 41 07/06/2024   ALT 18 06/29/2024   AST 19 06/29/2024   NA 140 06/29/2024   K 3.7 06/29/2024   CL 108 06/29/2024   CREATININE 1.05 06/29/2024   BUN 19 06/29/2024   CO2 25 06/29/2024   TSH 1.84 07/06/2024   PSA 0.51 07/06/2024   HGBA1C 7.1 (A) 05/18/2024   MICROALBUR <0.7 12/30/2023    CT CHEST LUNG CA SCREEN LOW DOSE W/O CM Result Date: 03/18/2024 CLINICAL DATA:  Lung cancer screening. Former asymptomatic smoker with 39 pack-year smoking history. History of chronic lymphocytic leukemia. EXAM: CT CHEST WITHOUT CONTRAST LOW-DOSE FOR LUNG CANCER SCREENING TECHNIQUE: Multidetector CT imaging of the chest was performed following the standard protocol without IV contrast. RADIATION DOSE REDUCTION: This exam was performed according to the departmental dose-optimization program  which includes automated exposure control, adjustment of the mA and/or kV according to patient size and/or use of iterative reconstruction technique. COMPARISON:  02/22/2023 FINDINGS: Cardiovascular: Normal heart size. Aortic atherosclerosis. No pericardial effusion. Mediastinum/Nodes: In keeping with the clinical history of CLL there are multiple enlarged or borderline enlarged mediastinal and axillary lymph nodes. Index left axillary lymph node measures 1.2 cm, image 13/2. Previously 1.6 cm. Index right axillary node measures 1.4 cm, image 13/2. Previously 2.0 cm. Index low right paratracheal lymph node measures 1.2 cm, image 21/2. Previously 1.5 cm. Thyroid  gland, trachea and esophagus are unremarkable. Lungs/Pleura: No pleural fluid, interstitial edema or airspace disease. Mild emphysema with diffuse bronchial wall thickening. Calcified granuloma identified within the superior segment of right lower lobe. Unchanged appearance of tiny subpleural nodule in the posteromedial right lower lobe which has a mean derived diameter of 2.4 mm. Upper Abdomen: No acute abnormality. Index gastrohepatic ligament node has a short axis of 2.6 cm, image 62/2. Previously this measured the same. Musculoskeletal: No chest wall mass or suspicious bone lesions identified. IMPRESSION: 1. Lung-RADS 2, benign appearance or behavior. Continue annual screening with low-dose chest CT without contrast in 12 months. 2. Thoracic and upper abdominal adenopathy consistent with the clinical history of CLL. Index lymph nodes are mildly decreased in size from previous exam. 3.  Aortic Atherosclerosis (ICD10-I70.0). Electronically Signed   By: Waddell Calk M.D.   On: 03/18/2024 09:45   DG ABD ACUTE 2+V W 1V CHEST Result Date: 07/06/2024 EXAM: UPRIGHT AND SUPINE XRAY VIEWS OF THE ABDOMEN AND 1 VIEW OF THE CHEST 07/06/2024 02:46:33 PM COMPARISON: Chest radiograph 11/16/2021. CLINICAL HISTORY: Right flank pain. RUQ/right side discomfort x 1-2  months. FINDINGS: LUNGS AND PLEURA: No consolidation or pulmonary edema. No pleural effusion or pneumothorax. HEART AND MEDIASTINUM: No acute abnormality of  the cardiac and mediastinal silhouettes. BOWEL: The bowel gas pattern is nonspecific. No bowel obstruction. PERITONEUM AND SOFT TISSUES: No abnormal calcifications. No free air. BONES: No acute osseous abnormality. Status post right shoulder arthroplasty. IMPRESSION: 1. No acute cardiopulmonary or abdominal process identified. Electronically signed by: Waddell Calk MD 07/06/2024 03:38 PM EDT RP Workstation: [id number]     Assessment & Plan:  Need for influenza vaccination -     Flu vaccine trivalent PF, 6mos and older(Flulaval,Afluria,Fluarix,Fluzone)  Right flank pain, chronic -     DG ABD ACUTE 2+V W 1V CHEST; Future -     Lipase; Future -     Urinalysis, Routine w reflex microscopic; Future -     CT ABDOMEN PELVIS W CONTRAST; Future  Abnormal electrocardiogram  Primary hypertension -     TSH; Future -     EKG 12-Lead  Prostate cancer screening -     PSA; Future  Hyperlipidemia with target LDL less than 100 -     Lipid panel; Future -     TSH; Future  Elevated lipase -     CT ABDOMEN PELVIS W CONTRAST; Future     Follow-up: Return in about 3 months (around 10/05/2024).  Debby Molt, MD

## 2024-07-07 ENCOUNTER — Other Ambulatory Visit: Payer: Self-pay

## 2024-07-07 DIAGNOSIS — R0609 Other forms of dyspnea: Secondary | ICD-10-CM | POA: Insufficient documentation

## 2024-07-07 DIAGNOSIS — B356 Tinea cruris: Secondary | ICD-10-CM | POA: Insufficient documentation

## 2024-07-07 MED ORDER — KETOCONAZOLE 2 % EX CREA
1.0000 | TOPICAL_CREAM | Freq: Two times a day (BID) | CUTANEOUS | 2 refills | Status: AC
Start: 2024-07-07 — End: ?

## 2024-07-10 ENCOUNTER — Inpatient Hospital Stay: Admission: RE | Admit: 2024-07-10 | Source: Ambulatory Visit

## 2024-07-10 ENCOUNTER — Telehealth: Payer: Self-pay | Admitting: *Deleted

## 2024-07-10 NOTE — Progress Notes (Signed)
 Care Guide Pharmacy Note  07/10/2024 Name: ZYLEN WENIG MRN: [id number] DOB: Jun 17, 1966  Referred By: Joshua Debby CROME, MD Reason for referral: Call Attempt #1 and Complex Care Management (Outreach to schedule referral with pharmacist )   RISHON THILGES is a 58 y.o. year old male who is a primary care patient of Joshua Debby CROME, MD.  Elgin FORBES Eagles was referred to the pharmacist for assistance related to: HTN  An unsuccessful telephone outreach was attempted today to contact the patient who was referred to the pharmacy team for assistance with medication management. Additional attempts will be made to contact the patient.  Thedford Franks, CMA [place]  [facility], [facility] Guide Direct Dial: [phone]  Fax: [phone] Website: [email or web address]

## 2024-07-13 NOTE — Progress Notes (Signed)
 Care Guide Pharmacy Note  07/13/2024 Name: PETER KEYWORTH MRN: [id number] DOB: 10-30-1965  Referred By: Joshua Debby CROME, MD Reason for referral: Call Attempt #1 and Complex Care Management (Outreach to schedule referral with pharmacist )   MARQUIES WANAT is a 58 y.o. year old male who is a primary care patient of Joshua Debby CROME, MD.  KENSLEY LARES was referred to the pharmacist for assistance related to: HTN  Successful contact was made with the patient to discuss pharmacy services including being ready for the pharmacist to call at least 5 minutes before the scheduled appointment time and to have medication bottles and any blood pressure readings ready for review. The patient agreed to meet with the pharmacist via telephone visit on 07/21/2024  Thedford Franks, CMA [place]  [facility], [facility] Guide Direct Dial: [phone]  Fax: [phone] Website: [email or web address]

## 2024-07-15 ENCOUNTER — Other Ambulatory Visit: Payer: Self-pay | Admitting: *Deleted

## 2024-07-15 ENCOUNTER — Other Ambulatory Visit: Payer: Self-pay | Admitting: Hematology and Oncology

## 2024-07-15 ENCOUNTER — Other Ambulatory Visit: Payer: Self-pay

## 2024-07-15 DIAGNOSIS — C9110 Chronic lymphocytic leukemia of B-cell type not having achieved remission: Secondary | ICD-10-CM

## 2024-07-15 MED ORDER — VENETOCLAX 100 MG PO TABS
400.0000 mg | ORAL_TABLET | Freq: Every day | ORAL | 0 refills | Status: DC
  Filled 2024-07-15: qty 140, 35d supply, fill #0
  Filled 2024-07-17: qty 120, 30d supply, fill #0

## 2024-07-16 ENCOUNTER — Inpatient Hospital Stay
Admission: RE | Admit: 2024-07-16 | Discharge: 2024-07-16 | Disposition: A | Source: Ambulatory Visit | Attending: Internal Medicine | Admitting: Internal Medicine

## 2024-07-16 DIAGNOSIS — R748 Abnormal levels of other serum enzymes: Secondary | ICD-10-CM

## 2024-07-16 DIAGNOSIS — G8929 Other chronic pain: Secondary | ICD-10-CM

## 2024-07-16 MED ORDER — IOPAMIDOL (ISOVUE-370) INJECTION 76%
80.0000 mL | Freq: Once | INTRAVENOUS | Status: AC | PRN
  Administered 2024-07-16: 80 mL via INTRAVENOUS

## 2024-07-17 ENCOUNTER — Other Ambulatory Visit: Payer: Self-pay

## 2024-07-17 NOTE — Progress Notes (Signed)
 Specialty Pharmacy Refill Coordination Note  James Parrish is a 58 y.o. male contacted today regarding refills of specialty medication(s) Venetoclax  (VENCLEXTA )   Patient requested Delivery   Delivery date: 07/21/24   Verified address: [address] [place]   Medication will be filled on 07/20/24.

## 2024-07-21 ENCOUNTER — Other Ambulatory Visit (INDEPENDENT_AMBULATORY_CARE_PROVIDER_SITE_OTHER): Admitting: Pharmacist

## 2024-07-21 DIAGNOSIS — I10 Essential (primary) hypertension: Secondary | ICD-10-CM

## 2024-07-21 NOTE — Patient Instructions (Signed)
 It was a pleasure speaking with you today!  Recommended to check home blood pressure and heart rate twice daily for the rest of this week while on indapamide . Will message  on Friday to get BP readings and decide on treatment plan.  Feel free to call with any questions or concerns!  Darrelyn Drum, PharmD, BCPS, CPP Clinical Pharmacist Practitioner [place] [facility] [phone]

## 2024-07-21 NOTE — Progress Notes (Signed)
 07/21/2024 Name: James Parrish MRN: [id number] DOB: 1966/07/28  Chief Complaint  Patient presents with   Hypertension   Medication Management    James Parrish is a 58 y.o. year old male who presented for a telephone visit.   They were referred to the pharmacist by their PCP for assistance in managing hypertension.   Subjective:  Care Team: Primary Care Provider: Joshua Debby CROME, MD ; Next Scheduled Visit: 10/05/24  Medication Access/Adherence  Current Pharmacy:  [facility] [id number] [place], [place] - [address] [place] [address] Phone: [phone] Fax: [phone]  [facility] - [place], [place] - [address] [place] [address] Phone: [phone] Fax: [phone]  [facility] [id number] - [place], [place] - [address] [facility] [address] [facility] [facility] [address] [place] [address] Phone: [phone] Fax: [phone]  DARRYLE LONG - [facility] [address] [place] [address] Phone: [phone] Fax: [phone]   Patient reports affordability concerns with their medications: No  Patient reports access/transportation concerns to their pharmacy: No  Patient reports adherence concerns with their medications:  No    Pt asks about recent CT results that showed fatty liver. Inquires what the plan is for addressing this.   Hypertension:  Current medications: amlodipine  10 mg daily, losartan  100 mg daily, nebivolol  10 mg daily, spironolactone  50 mg daily, indapamide  1.25 mg daily  *Indapamide  was discontinued per PCP note 07/06/24 however pt notes he was not aware this was stopped and has still been taking.  Patient has a validated, automated, upper arm home BP cuff Current blood pressure readings readings: 134/86 at home prior to last OV  Patient denies hypotensive s/sx including dizziness, lightheadedness.     Objective:  Lab Results  Component Value  Date   HGBA1C 7.1 (A) 05/18/2024    Lab Results  Component Value Date   CREATININE 1.05 06/29/2024   BUN 19 06/29/2024   NA 140 06/29/2024   K 3.7 06/29/2024   CL 108 06/29/2024   CO2 25 06/29/2024    Lab Results  Component Value Date   CHOL 116 07/06/2024   HDL 28.70 (L) 07/06/2024   LDLCALC 41 07/06/2024   LDLDIRECT 63.0 11/01/2022   TRIG 232.0 (H) 07/06/2024   CHOLHDL 4 07/06/2024    Medications Reviewed Today     Reviewed by Merceda Lela SAUNDERS, RPH (Pharmacist) on 07/21/24 at 1613  Med List Status: <None>   Medication Order Taking? Sig Documenting Provider Last Dose Status Informant  allopurinol  (ZYLOPRIM ) 300 MG tablet [id number] Yes Take 1 tablet (300 mg total) by mouth daily. Federico Norleen ONEIDA MADISON, MD  Active   amLODipine  (NORVASC ) 10 MG tablet [id number] Yes TAKE 1 TABLET BY MOUTH AT BEDTIME Joshua Debby CROME, MD  Active   atorvastatin  (LIPITOR) 10 MG tablet [id number] Yes TAKE 1 TABLET BY MOUTH DAILY Shamleffer, Ibtehal Jaralla, MD  Active   dapagliflozin  propanediol (FARXIGA ) 10 MG TABS tablet [id number] Yes Take 1 tablet (10 mg total) by mouth daily before breakfast. Shamleffer, Ibtehal Jaralla, MD  Active   diclofenac (VOLTAREN) 75 MG EC tablet [id number] Yes Take 75 mg by mouth 2 (two) times daily. [provider]  Active   glucose blood test strip [id number] Yes Use as instructed to test blood sugar 2 times daily E11.65    Onetouch strips Shamleffer, Ibtehal Jaralla, MD  Active Self  indapamide  (LOZOL ) 1.25 MG tablet [id number]  Yes Take 1.25 mg by mouth daily. [provider]  Active   insulin  glargine (LANTUS  SOLOSTAR) 100 UNIT/ML Solostar Pen [id number] Yes Inject 30 Units into the skin daily. Shamleffer, Donell Cardinal, MD  Active   Insulin  Pen Needle 29G X MISC [id number] Yes 1 Device by Does not apply route daily in the afternoon. Shamleffer, Ibtehal Jaralla, MD  Active   ketoconazole  (NIZORAL ) 2 % cream [id number] Yes Apply 1 Application topically 2  (two) times daily. Joshua Debby CROME, MD  Active   losartan  (COZAAR ) 100 MG tablet [id number] Yes TAKE 1 TABLET BY MOUTH DAILY Joshua Debby CROME, MD  Active   metFORMIN  (GLUCOPHAGE ) 1000 MG tablet [id number] Yes Take 1 tablet (1,000 mg total) by mouth 2 (two) times daily with a meal. Shamleffer, Ibtehal Jaralla, MD  Active   Multiple Vitamin (MULTIVITAMIN WITH MINERALS) TABS tablet [id number] Yes Take 1 tablet by mouth daily. [provider]  Active Self  nebivolol  (BYSTOLIC ) 10 MG tablet [id number] Yes TAKE 1 TABLET BY MOUTH DAILY Joshua Debby CROME, MD  Active   Semaglutide , 2 MG/DOSE, (OZEMPIC , 2 MG/DOSE,) 8 MG/3ML SOPN [id number] Yes DIAL AND INJECT UNDER THE SKIN 2 MG WEEKLY Shamleffer, Ibtehal Jaralla, MD  Active   spironolactone  (ALDACTONE ) 50 MG tablet [id number] Yes TAKE 1 TABLET BY MOUTH DAILY Joshua Debby CROME, MD  Active   tadalafil  (CIALIS ) 20 MG tablet [id number] Yes TAKE 1 TABLET BY MOUTH DAILY AS NEEDED FOR ERECTILE DYSFUNCTION Joshua Debby CROME, MD  Active   venetoclax  (VENCLEXTA ) 100 MG tablet [id number] Yes Take 4 tablets (400 mg total) by mouth daily. Tablets should be swallowed whole with a meal and a full glass of water. Federico Norleen ONEIDA MADISON, MD  Active               Assessment/Plan:   Hypertension: - Currently controlled, BP goal <130/80. BP was well controlled in office 9/22 and BP at home prior to OV was above goal on current regimen. - Reviewed long term cardiovascular and renal outcomes of uncontrolled blood pressure - Reviewed appropriate blood pressure monitoring technique and reviewed goal blood pressure. Recommended to check home blood pressure and heart rate twice daily for the rest of this week while on indapamide . Will message pt on Friday to get BP readings and decide on treatment plan. - Scr and K WNL - Recommend to continue current regimen   Fatty Liver: - Encouraged focus on lifestyle/dietary changes along with lipid and T2DM control to address fatty liver -  Encouraged increased protein/fiber and watching portion sizes of high fat and starchy foods - Pt already on Ozempic  and LDL currently controlled <70   Follow Up Plan: 10/10   Darrelyn Drum, PharmD, BCPS, CPP Clinical Pharmacist Practitioner [place] [facility] [phone]

## 2024-07-28 ENCOUNTER — Telehealth: Payer: Self-pay

## 2024-07-28 ENCOUNTER — Inpatient Hospital Stay: Attending: Hematology and Oncology

## 2024-07-28 ENCOUNTER — Inpatient Hospital Stay (HOSPITAL_BASED_OUTPATIENT_CLINIC_OR_DEPARTMENT_OTHER): Admitting: Hematology and Oncology

## 2024-07-28 ENCOUNTER — Inpatient Hospital Stay: Admitting: Hematology and Oncology

## 2024-07-28 ENCOUNTER — Telehealth: Payer: Self-pay | Admitting: Pharmacist

## 2024-07-28 VITALS — BP 143/95 | HR 84 | Temp 97.7°F | Resp 15 | Wt 266.0 lb

## 2024-07-28 DIAGNOSIS — Z803 Family history of malignant neoplasm of breast: Secondary | ICD-10-CM | POA: Diagnosis not present

## 2024-07-28 DIAGNOSIS — D72820 Lymphocytosis (symptomatic): Secondary | ICD-10-CM

## 2024-07-28 DIAGNOSIS — C9110 Chronic lymphocytic leukemia of B-cell type not having achieved remission: Secondary | ICD-10-CM

## 2024-07-28 DIAGNOSIS — R5383 Other fatigue: Secondary | ICD-10-CM | POA: Diagnosis not present

## 2024-07-28 DIAGNOSIS — Z87891 Personal history of nicotine dependence: Secondary | ICD-10-CM | POA: Insufficient documentation

## 2024-07-28 DIAGNOSIS — I10 Essential (primary) hypertension: Secondary | ICD-10-CM

## 2024-07-28 LAB — CBC WITH DIFFERENTIAL (CANCER CENTER ONLY)
Abs Immature Granulocytes: 0.07 K/uL (ref 0.00–0.07)
Basophils Absolute: 0 K/uL (ref 0.0–0.1)
Basophils Relative: 0 %
Eosinophils Absolute: 0 K/uL (ref 0.0–0.5)
Eosinophils Relative: 0 %
HCT: 46.1 % (ref 39.0–52.0)
Hemoglobin: 15.2 g/dL (ref 13.0–17.0)
Immature Granulocytes: 1 %
Lymphocytes Relative: 37 %
Lymphs Abs: 2 K/uL (ref 0.7–4.0)
MCH: 24.8 pg — ABNORMAL LOW (ref 26.0–34.0)
MCHC: 33 g/dL (ref 30.0–36.0)
MCV: 75.1 fL — ABNORMAL LOW (ref 80.0–100.0)
Monocytes Absolute: 0.8 K/uL (ref 0.1–1.0)
Monocytes Relative: 14 %
Neutro Abs: 2.5 K/uL (ref 1.7–7.7)
Neutrophils Relative %: 48 %
Platelet Count: 177 K/uL (ref 150–400)
RBC: 6.14 MIL/uL — ABNORMAL HIGH (ref 4.22–5.81)
RDW: 16.4 % — ABNORMAL HIGH (ref 11.5–15.5)
WBC Count: 5.4 K/uL (ref 4.0–10.5)
nRBC: 0 % (ref 0.0–0.2)

## 2024-07-28 LAB — URIC ACID: Uric Acid, Serum: 5.3 mg/dL (ref 3.7–8.6)

## 2024-07-28 LAB — CMP (CANCER CENTER ONLY)
ALT: 15 U/L (ref 0–44)
AST: 17 U/L (ref 15–41)
Albumin: 4.5 g/dL (ref 3.5–5.0)
Alkaline Phosphatase: 48 U/L (ref 38–126)
Anion gap: 8 (ref 5–15)
BUN: 18 mg/dL (ref 6–20)
CO2: 27 mmol/L (ref 22–32)
Calcium: 9.6 mg/dL (ref 8.9–10.3)
Chloride: 107 mmol/L (ref 98–111)
Creatinine: 1.06 mg/dL (ref 0.61–1.24)
GFR, Estimated: >60 mL/min (ref >60)
Glucose, Bld: 123 mg/dL — ABNORMAL HIGH (ref 70–99)
Potassium: 4 mmol/L (ref 3.5–5.1)
Sodium: 142 mmol/L (ref 135–145)
Total Bilirubin: 0.3 mg/dL (ref 0.0–1.2)
Total Protein: 6.7 g/dL (ref 6.5–8.1)

## 2024-07-28 LAB — LACTATE DEHYDROGENASE: LDH: 125 U/L (ref 98–192)

## 2024-07-28 MED ORDER — CHLORTHALIDONE 25 MG PO TABS: 25.0000 mg | ORAL_TABLET | Freq: Every day | ORAL | 2 refills | Status: DC

## 2024-07-28 NOTE — Telephone Encounter (Signed)
 Contacted patient to follow up on blood pressure readings that were sent last week. No answer, left voicemail with call back number.  Darrelyn Drum, PharmD, BCPS, CPP Clinical Pharmacist Practitioner [place] [facility] at [facility] [phone]

## 2024-07-28 NOTE — Progress Notes (Signed)
   07/28/2024 Name: James Parrish MRN: [id number] DOB: August 22, 1966  Chief Complaint  Patient presents with   Medication Management    James Parrish is a 58 y.o. year old male who presented for a telephone visit.   They were referred to the pharmacist by their PCP for assistance in managing hypertension.   Subjective:  Care Team: Primary Care Provider: Joshua Debby CROME, MD ; Next Scheduled Visit: 10/05/24  Medication Access/Adherence  Current Pharmacy:  [facility] [id number] [place] [place], [place] - [address] [place] [address] Phone: [phone] Fax: [phone]  [facility] - [place], [place] - [address] [place] [address] Phone: [phone] Fax: [phone]  [facility] [id number] - [place], [place] - [address] [facility] [address] [facility] [address] [address] [place] [address] Phone: [phone] Fax: [phone]  DARR[facility] LONG - [facility] [address] [place] [address] Phone: [phone] Fax: [phone]   Patient reports affordability concerns with their medications: No  Patient reports access/transportation concerns to their pharmacy: No  Patient reports adherence concerns with their medications:  No    Pt asks about recent CT results that showed fatty liver. Inquires what the plan is for addressing this.   Hypertension:  Current medications: amlodipine  10 mg daily, losartan  100 mg daily, nebivolol  10 mg daily, spironolactone  50 mg daily, indapamide  1.25 mg daily  *Indapamide  was discontinued per PCP note 07/06/24 however pt notes he was not aware this was stopped and has still been taking.  Patient has a validated, automated, upper arm home BP cuff Current blood pressure readings readings: 126/90, 148/93, 135/83, 144/96, 138/96, 139/95 at home BP at onc office visit was 143/95 today  Patient denies hypotensive s/sx including dizziness, lightheadedness.      Objective:  BP Readings from Last 3 Encounters:  07/28/24 (!) 143/95  07/06/24 116/80  06/01/24 128/89     Lab Results  Component Value Date   HGBA1C 7.1 (A) 05/18/2024    Lab Results  Component Value Date   CREATININE 1.06 07/28/2024   BUN 18 07/28/2024   NA 142 07/28/2024   K 4.0 07/28/2024   CL 107 07/28/2024   CO2 27 07/28/2024    Lab Results  Component Value Date   CHOL 116 07/06/2024   HDL 28.70 (L) 07/06/2024   LDLCALC 41 07/06/2024   LDLDIRECT 63.0 11/01/2022   TRIG 232.0 (H) 07/06/2024   CHOLHDL 4 07/06/2024    Medications Reviewed Today   Medications were not reviewed in this encounter       Assessment/Plan:   Hypertension: - Currently uncontrolled, BP goal <130/80. BP at home and OV today has been uncontrolled on current regimen. - Reviewed long term cardiovascular and renal outcomes of uncontrolled blood pressure - Reviewed appropriate blood pressure monitoring technique and reviewed goal blood pressure. Recommended to check home blood pressure and heart rate twice daily  - Scr and K WNL today - Recommend to stop indapamide  and start chlorthalidone 25 mg daily  Fatty Liver: - Encouraged focus on lifestyle/dietary changes along with lipid and T2DM control to address fatty liver - Encouraged increased protein/fiber and watching portion sizes of high fat and starchy foods - Pt already on Ozempic  and LDL currently controlled <70   Follow Up Plan: 10/28  Darrelyn Drum, PharmD, BCPS, CPP Clinical Pharmacist Practitioner [place] [facility] at [facility] [phone]

## 2024-07-28 NOTE — Progress Notes (Unsigned)
Rescheduled same day

## 2024-07-28 NOTE — Telephone Encounter (Signed)
 Please call patient back

## 2024-07-28 NOTE — Telephone Encounter (Signed)
 Pt requested fax to dental office (Dr Carolee, [phone]) for ok for routine cleaning. Fax sent to [facility] at [phone].

## 2024-07-28 NOTE — Progress Notes (Signed)
 [facility] [phone]   [phone]  PROGRESS NOTE  Patient Care Team: Joshua Debby CROME, MD as PCP - General (Internal Medicine) Shlomo Wilbert SAUNDERS, MD as PCP - Sleep Medicine (Cardiology) Merceda Lela SAUNDERS, [facility] (Pharmacist)  Hematological/Oncological History # CLL, Rai Stage 0. Del 11 01/25/2020: WBC 9.6, Hgb 15.6, MCV 79.1, Plt 241 05/29/2021: WBC 13.3, Hgb 15.4, MCV 76.9, Plt 203 11/16/2021: patient had routine CBC collected, showed WBC 15.9, Hgb 14.8, MCV 76.4, ALC 11.6 12/14/2021: establish care with Dr. Federico  05/24/2023: WBC 43.2, Hgb 14.6, MCV 77.8, Plt 217 11/25/2023: WBC 95.9, Hgb 15.4, MCV 77.8, Plt 221  01/13/2024: start of obintuzumab therapy. Cycle 1 Day 1  02/10/2024: Cycle 2 Day 1 of obinutuzumab . Start of venetoclax  03/09/2024: Cycle 3 Day 1 of obinutuzumab /Venetoclax  04/06/2024: Cycle 4 Day 1 of obinutuzumab /Venetoclax  05/04/2024: Cycle 5 Day 1 of obinutuzumab /Venetoclax  06/01/2024: Cycle 6 Day 1 of obinutuzumab /Venetoclax   Interval History:  James Parrish 58 y.o. male with medical history significant for CLL who presents for a follow up visit. The patient's last visit was on 06/01/2024.  In the interim, he denies any changes to his health.   On exam today Mr. Dorris reports he has been well overall in the room since our last visit and is tolerating his venetoclax  well.  His appetite and energy are both good.  He does have some occasional fatigue and rarely misses a dose of his medication.  He normally tries to take the medication between noon and 3 PM.  He notes that he is interested in having dental cleaning done but needs a form sent to his dentist.  Otherwise he has not had any infectious symptoms such as runny nose, sore throat, cough.  He denies any fevers, chills, sweats, nausea, vomiting or diarrhea.  Overall he is willing and able to continue on his venetoclax  therapy at this time.  A full 10 point ROS otherwise negative.  MEDICAL  HISTORY:  Past Medical History:  Diagnosis Date   CLL (chronic lymphocytic leukemia) (HCC)    Complication of anesthesia    hard to wake up    Diabetes mellitus without complication [facility])    ED (erectile dysfunction)    History of COVID-19 04/2019   Hyperlipidemia    Hypertension    Sleep apnea    uses CPAP nightly    SURGICAL HISTORY: Past Surgical History:  Procedure Laterality Date   BRONCHIAL NEEDLE ASPIRATION BIOPSY  05/29/2021   Procedure: BRONCHIAL NEEDLE ASPIRATION BIOPSIES;  Surgeon: Brenna Adine CROME, DO;  Location: [facility];  Service: Pulmonary;;   COLONOSCOPY     finger surgery Right    Index   KNEE SURGERY Left    KNEE SURGERY Right    ORIF PATELLA Right    REVERSE SHOULDER ARTHROPLASTY Right 08/31/2020   Procedure: REVERSE SHOULDER ARTHROPLASTY;  Surgeon: Cristy Bonner DASEN, MD;  Location: [facility];  Service: Orthopedics;  Laterality: Right;   SHOULDER ARTHROSCOPY WITH ROTATOR CUFF REPAIR Left 02/04/2020   Procedure: SHOULDER ARTHROSCOPY WITH ROTATOR CUFF REPAIR WITH BICEP TENODESIS;  Surgeon: Cristy Bonner DASEN, MD;  Location: [facility];  Service: Orthopedics;  Laterality: Left;   VIDEO BRONCHOSCOPY WITH ENDOBRONCHIAL ULTRASOUND Left 05/29/2021   Procedure: VIDEO BRONCHOSCOPY WITH ENDOBRONCHIAL ULTRASOUND;  Surgeon: Brenna Adine CROME, DO;  Location: [facility];  Service: Pulmonary;  Laterality: Left;    SOCIAL HISTORY: Social History   Socioeconomic History   Marital status: Married    Spouse name:  Not on file   Number of children: Not on file   Years of education: Not on file   Highest education level: 12th grade  Occupational History   Not on file  Tobacco Use   Smoking status: Former    Current packs/day: 0.00    Average packs/day: 1.5 packs/day for 25.0 years (37.5 ttl pk-yrs)    Types: Cigarettes    Start date: 04/1995    Quit date: 04/2020    Years since quitting: 4.2    Passive exposure: Past   Smokeless tobacco:  Never  Vaping Use   Vaping status: Never Used  Substance and Sexual Activity   Alcohol use: Not Currently    Comment: occasionally   Drug use: Never   Sexual activity: Yes    Partners: Female  Other Topics Concern   Not on file  Social History Narrative   Not on file   Social Drivers of Health   Financial Resource Strain: Medium Risk (07/04/2024)   Overall Financial Resource Strain (CARDIA)    Difficulty of Paying Living Expenses: Somewhat hard  Food Insecurity: Food Insecurity Present (07/04/2024)   Hunger Vital Sign    Worried About Running Out of Food in the Last Year: Often true    Ran Out of Food in the Last Year: Sometimes true  Transportation Needs: Unmet Transportation Needs (07/04/2024)   PRAPARE - Transportation    Lack of Transportation (Medical): Yes    Lack of Transportation (Non-Medical): Yes  Physical Activity: Insufficiently Active (07/04/2024)   Exercise Vital Sign    Days of Exercise per Week: 7 days    Minutes of Exercise per Session: 10 min  Stress: No Stress Concern Present (07/04/2024)   [employer] of Occupational Health - Occupational Stress Questionnaire    Feeling of Stress: Only a little  Social Connections: Moderately Isolated (07/04/2024)   Social Connection and Isolation Panel    Frequency of Communication with Friends and Family: Three times a week    Frequency of Social Gatherings with Friends and Family: Once a week    Attends Religious Services: Never    Database administrator or Organizations: No    Attends Engineer, structural: Not on file    Marital Status: Married  Catering manager Violence: Not on file    FAMILY HISTORY: Family History  Problem Relation Age of Onset   Cancer Mother        breast cancer    Hypertension Mother    Diabetes Mother    Venous thrombosis Son    Colon cancer Neg Hx    Esophageal cancer Neg Hx    Stomach cancer Neg Hx    Rectal cancer Neg Hx     ALLERGIES:  is allergic to gazyva   [obinutuzumab ].  MEDICATIONS:  Current Outpatient Medications  Medication Sig Dispense Refill   allopurinol  (ZYLOPRIM ) 300 MG tablet Take 1 tablet (300 mg total) by mouth daily. 30 tablet 3   amLODipine  (NORVASC ) 10 MG tablet TAKE 1 TABLET BY MOUTH AT BEDTIME 90 tablet 1   atorvastatin  (LIPITOR) 10 MG tablet TAKE 1 TABLET BY MOUTH DAILY 30 tablet 3   dapagliflozin  propanediol (FARXIGA ) 10 MG TABS tablet Take 1 tablet (10 mg total) by mouth daily before breakfast. 90 tablet 3   diclofenac (VOLTAREN) 75 MG EC tablet Take 75 mg by mouth 2 (two) times daily.     glucose blood test strip Use as instructed to test blood sugar 2 times daily E11.65  Onetouch strips 100 each 12   indapamide  (LOZOL ) 1.25 MG tablet Take 1.25 mg by mouth daily.     insulin  glargine (LANTUS  SOLOSTAR) 100 UNIT/ML Solostar Pen Inject 30 Units into the skin daily. 30 mL 6   Insulin  Pen Needle 29G X MISC 1 Device by Does not apply route daily in the afternoon. 100 each 3   ketoconazole  (NIZORAL ) 2 % cream Apply 1 Application topically 2 (two) times daily. 60 g 2   losartan  (COZAAR ) 100 MG tablet TAKE 1 TABLET BY MOUTH DAILY 90 tablet 1   metFORMIN  (GLUCOPHAGE ) 1000 MG tablet Take 1 tablet (1,000 mg total) by mouth 2 (two) times daily with a meal. 180 tablet 3   Multiple Vitamin (MULTIVITAMIN WITH MINERALS) TABS tablet Take 1 tablet by mouth daily.     nebivolol  (BYSTOLIC ) 10 MG tablet TAKE 1 TABLET BY MOUTH DAILY 90 tablet 1   Semaglutide , 2 MG/DOSE, (OZEMPIC , 2 MG/DOSE,) 8 MG/3ML SOPN DIAL AND INJECT UNDER THE SKIN 2 MG WEEKLY 3 mL 1   spironolactone  (ALDACTONE ) 50 MG tablet TAKE 1 TABLET BY MOUTH DAILY 90 tablet 0   tadalafil  (CIALIS ) 20 MG tablet TAKE 1 TABLET BY MOUTH DAILY AS NEEDED FOR ERECTILE DYSFUNCTION 10 tablet 2   venetoclax  (VENCLEXTA ) 100 MG tablet Take 4 tablets (400 mg total) by mouth daily. Tablets should be swallowed whole with a meal and a full glass of water. 120 tablet 0   No current  facility-administered medications for this visit.    REVIEW OF SYSTEMS:   Constitutional: ( - ) fevers, ( - )  chills , ( - ) night sweats Eyes: ( - ) blurriness of vision, ( - ) double vision, ( - ) watery eyes Ears, nose, mouth, throat, and face: ( - ) mucositis, ( - ) sore throat Respiratory: ( - ) cough, ( - ) dyspnea, ( - ) wheezes Cardiovascular: ( - ) palpitation, ( - ) chest discomfort, ( - ) lower extremity swelling Gastrointestinal:  ( - ) nausea, ( - ) heartburn, ( - ) change in bowel habits Skin: ( - ) abnormal skin rashes Lymphatics: ( - ) new lymphadenopathy, ( - ) easy bruising Neurological: ( - ) numbness, ( - ) tingling, ( - ) new weaknesses Behavioral/Psych: ( - ) mood change, ( - ) new changes  All other systems were reviewed with the patient and are negative.  PHYSICAL EXAMINATION: ECOG PERFORMANCE STATUS: 0 - Asymptomatic  Vitals:   07/28/24 0945  BP: (!) 143/95  Pulse: 84  Resp: 15  Temp: 97.7 F (36.5 C)  SpO2: 99%     Filed Weights   07/28/24 0945  Weight: 266 lb (120.7 kg)      GENERAL: Well-appearing middle-aged African-American male alert, no distress and comfortable SKIN: skin color, texture, turgor are normal, no rashes or significant lesions EYES: conjunctiva are pink and non-injected, sclera clear NECK: supple, non-tender LYMPH: Mildly prominent left-sided cervical lymph nodes, no other overt lymphadenopathy noted on exam LUNGS: clear to auscultation and percussion with normal breathing effort HEART: regular rate & rhythm and no murmurs and no lower extremity edema Musculoskeletal: no cyanosis of digits and no clubbing  PSYCH: alert & oriented x 3, fluent speech NEURO: no focal motor/sensory deficits  LABORATORY DATA:  I have reviewed the data as listed    Latest Ref Rng & Units 07/28/2024    8:11 AM 06/29/2024    7:47 AM 06/01/2024   10:22 AM  CBC  WBC 4.0 - 10.5 K/uL 5.4  4.9  3.7   Hemoglobin 13.0 - 17.0 g/dL 84.7  84.9  84.5    Hematocrit 39.0 - 52.0 % 46.1  45.6  47.2   Platelets 150 - 400 K/uL 177  187  209        Latest Ref Rng & Units 07/28/2024    8:11 AM 06/29/2024    7:47 AM 06/01/2024   10:22 AM  CMP  Glucose 70 - 99 mg/dL 876  856  854   BUN 6 - 20 mg/dL 18  19  12    Creatinine 0.61 - 1.24 mg/dL 8.93  8.94  9.10   Sodium 135 - 145 mmol/L 142  140  141   Potassium 3.5 - 5.1 mmol/L 4.0  3.7  3.8   Chloride 98 - 111 mmol/L 107  108  107   CO2 22 - 32 mmol/L 27  25  24    Calcium  8.9 - 10.3 mg/dL 9.6  9.0  9.2   Total Protein 6.5 - 8.1 g/dL 6.7  7.0  7.0   Total Bilirubin 0.0 - 1.2 mg/dL 0.3  0.3  0.3   Alkaline Phos 38 - 126 U/L 48  50  55   AST 15 - 41 U/L 17  19  20    ALT 0 - 44 U/L 15  18  18      RADIOGRAPHIC STUDIES:  CT ABDOMEN PELVIS W CONTRAST Result Date: 07/16/2024 EXAM: CT ABDOMEN AND PELVIS WITH CONTRAST 07/16/2024 04:00:00 PM TECHNIQUE: CT of the abdomen and pelvis was performed with the administration of 80 mL of iopamidol (ISOVUE-370) 76% injection. Multiplanar reformatted images are provided for review. Automated exposure control, iterative reconstruction, and/or weight-based adjustment of the mA/kV was utilized to reduce the radiation dose to as low as reasonably achievable. COMPARISON: Radiographs 07/06/2024 and PET CT 02/06/2021. CLINICAL HISTORY: Flank pain, elevated lipase, CLL on oral XRT. FINDINGS: LOWER CHEST: Linear subsegmental atelectasis or scarring in both lower lobes. LIVER: Suspected diffuse hepatic steatosis. GALLBLADDER AND BILE DUCTS: Gallbladder is unremarkable. No biliary ductal dilatation. SPLEEN: No acute abnormality. PANCREAS: No acute abnormality. ADRENAL GLANDS: No acute abnormality. KIDNEYS, URETERS AND BLADDER: 1.3 cm simple left renal cyst noted on image 31 series 2. No further imaging workup of this lesion is indicated. An additional subcentimeter hypodense lesion in the left kidney is likely a cyst and warrants no further imaging workup. No stones in the kidneys  or ureters. No hydronephrosis. No perinephric or periureteral stranding. Urinary bladder is unremarkable. GI AND BOWEL: Stomach demonstrates no acute abnormality. There is no bowel obstruction. PERITONEUM AND RETROPERITONEUM: No ascites. No free air. VASCULATURE: Aorta is normal in caliber. LYMPH NODES: No lymphadenopathy. REPRODUCTIVE ORGANS: No acute abnormality. BONES AND SOFT TISSUES: Lumbar spondylosis and degenerative disc disease with suspected moderate bilateral foraminal impingement at L3-L4 and moderate right foraminal impingement at L4-L5. Possible left foraminal impingement due to facet spurring at T11-T12. Moderate to prominent degenerative hip arthropathy bilaterally. No acute osseous abnormality. No focal soft tissue abnormality. IMPRESSION: 1. No acute findings in the abdomen or pelvis. 2. Suspected diffuse hepatic steatosis. 3. Lumbar spondylosis and degenerative disc disease with moderate bilateral foraminal impingement at L3L4 and moderate right foraminal impingement at L4L5. Possible left foraminal impingement at T11T12 due to facet spurring. 4. Moderate to prominent bilateral hip degenerative arthropathy. 5. Linear subsegmental atelectasis or scarring in both lower lobes. Electronically signed by: Ryan Salvage MD 07/16/2024 04:46 PM EDT RP Workstation: [id number]   DG  ABD ACUTE 2+V W 1V CHEST Result Date: 07/06/2024 EXAM: UPRIGHT AND SUPINE XRAY VIEWS OF THE ABDOMEN AND 1 VIEW OF THE CHEST 07/06/2024 02:46:33 PM COMPARISON: Chest radiograph 11/16/2021. CLINICAL HISTORY: Right flank pain. RUQ/right side discomfort x 1-2 months. FINDINGS: LUNGS AND PLEURA: No consolidation or pulmonary edema. No pleural effusion or pneumothorax. HEART AND MEDIASTINUM: No acute abnormality of the cardiac and mediastinal silhouettes. BOWEL: The bowel gas pattern is nonspecific. No bowel obstruction. PERITONEUM AND SOFT TISSUES: No abnormal calcifications. No free air. BONES: No acute osseous abnormality.  Status post right shoulder arthroplasty. IMPRESSION: 1. No acute cardiopulmonary or abdominal process identified. Electronically signed by: Waddell Calk MD 07/06/2024 03:38 PM EDT RP Workstation: [id number]    ASSESSMENT & PLAN Elgin FORBES Eagles 58 y.o. male with medical history significant for CLL who presents for a follow up visit.   Due to progression of disease with rapidly rising white blood cell count the patient was started on obinutuzumab /venetoclax  therapy.  This was after discussion of different options including acalabrutinib/zanubrutinib.  The patient preferred the limited duration therapy.  # CLL, Rai Stage 0 --Due to rapidly rising white blood cell count patient was started on obinutuzumab /venetoclax .  Cycle 1 day 1 was on 01/13/2024. Obinutuzmab to continue x 6 months.  --Patient has started therapy and is tolerating it well. --Labs show white blood cell count 5.4, hemoglobin 10.2, MCV 75.1, platelets 177  -- Venetoclax  added with cycle 2. Plan to continue x 12 months.  --RTC in 8 weeks with interval 4 week labs.    No orders of the defined types were placed in this encounter.   All questions were answered. The patient knows to call the clinic with any problems, questions or concerns.  I have spent a total of 30 minutes minutes of face-to-face and non-face-to-face time, preparing to see the patient,  performing a medically appropriate examination, counseling and educating the patient, documenting clinical information in the electronic health record, and care coordination.   Norleen IVAR Kidney, MD Department of Hematology/Oncology [facility] at [facility] Phone: [phone] Pager: [phone] Email: [email or web address]   07/28/2024 10:51 AM  Shermon CHRISTELLA Hearing BD, Catovsky D, Caligaris-Cappio F, Dighiero G, Dhner H, Hillmen P, Keating M, Montserrat E, Chiorazzi N, Stilgenbauer S, Rai KR, [place], Eichhorst B, O'Brien S, Robak T, Seymour JF,  Kipps TJ. iwCLL guidelines for diagnosis, indications for treatment, response assessment, and supportive management of CLL. Blood. 2018 Jun 21;131(25):2745-2760.  Active disease should be clearly documented to initiate therapy. At least 1 of the following criteria should be met.  1) Evidence of progressive marrow failure as manifested by the development of, or worsening of, anemia and/or thrombocytopenia. Cutoff levels of Hb <10 g/dL or platelet counts <899  109/L are generally regarded as indication for treatment. However, in some patients, platelet counts <100  109/L may remain stable over a long period; this situation does not automatically require therapeutic intervention. 2) Massive (ie, >=6 cm below the left costal margin) or progressive or symptomatic splenomegaly. 3) Massive nodes (ie, >=10 cm in longest diameter) or progressive or symptomatic lymphadenopathy. 4) Progressive lymphocytosis with an increase of >=50% over a 49-month period, or lymphocyte doubling time (LDT) <6 months. LDT can be obtained by linear regression extrapolation of absolute lymphocyte counts obtained at intervals of 2 weeks over an observation period of 2 to 3 months; patients with initial blood lymphocyte counts <30  109/L may require a longer observation period to determine the LDT. Factors  contributing to lymphocytosis other than CLL (eg, infections, steroid administration) should be excluded. 5) Autoimmune complications including anemia or thrombocytopenia poorly responsive to corticosteroids. 6) Symptomatic or functional extranodal involvement (eg, skin, kidney, lung, spine). Disease-related symptoms as defined by any of the following: Unintentional weight loss >=10% within the previous 6 months. Significant fatigue (ie, ECOG performance scale 2 or worse; cannot work or unable to perform usual activities). Fevers >=100.31F or 38.0C for 2 or more weeks without evidence of infection. Night sweats for >=1 month  without evidence of infection.

## 2024-07-29 ENCOUNTER — Inpatient Hospital Stay: Admitting: Hematology and Oncology

## 2024-07-29 ENCOUNTER — Other Ambulatory Visit: Payer: Self-pay | Admitting: Internal Medicine

## 2024-07-29 DIAGNOSIS — N5201 Erectile dysfunction due to arterial insufficiency: Secondary | ICD-10-CM

## 2024-07-30 ENCOUNTER — Encounter: Payer: Self-pay | Admitting: Hematology and Oncology

## 2024-07-30 ENCOUNTER — Other Ambulatory Visit: Payer: Self-pay

## 2024-08-11 ENCOUNTER — Other Ambulatory Visit: Admitting: Pharmacist

## 2024-08-11 ENCOUNTER — Other Ambulatory Visit: Payer: Self-pay

## 2024-08-11 DIAGNOSIS — I10 Essential (primary) hypertension: Secondary | ICD-10-CM

## 2024-08-11 NOTE — Patient Instructions (Signed)
 It was a pleasure speaking with you today!  Continue your current regimen. Continue monitoring blood pressure at home.  Feel free to call with any questions or concerns!  Darrelyn Drum, PharmD, BCPS, CPP Clinical Pharmacist Practitioner [facility] [phone]

## 2024-08-11 NOTE — Progress Notes (Signed)
 08/11/2024 Name: James Parrish FILL MRN: [id number] DOB: November 28, 1965  Chief Complaint  Patient presents with   Hypertension   Medication Management    James Parrish is a 58 y.o. year old male who presented for a telephone visit.   They were referred to the pharmacist by their PCP for assistance in managing hypertension.   Subjective:  Care Team: Primary Care Provider: Joshua Debby CROME, MD ; Next Scheduled Visit: 10/05/24  Medication Access/Adherence  Current Pharmacy:  [facility] [id number] [place],  - [address] [place] [address] Phone: [phone] Fax: [phone]  [facility] - [place], [place] - [address] [place] [address] Phone: [phone] Fax: [phone]  [facility] [id number] - [place], [place] - [address] [facility] [address] [facility] [address] [place] [address] Phone: [phone] Fax: [phone]  DARRYLE LONG - [facility] [address] [place] [address] Phone: [phone] Fax: [phone]   Patient reports affordability concerns with their medications: No  Patient reports access/transportation concerns to their pharmacy: No  Patient reports adherence concerns with their medications:  No     Hypertension:  Current medications: amlodipine  10 mg daily, losartan  100 mg daily, nebivolol  10 mg daily, spironolactone  50 mg daily, chlorthalidone 25 mg daily Previous medication: indapamide  (ineffective)  Patient has a validated, automated, upper arm home BP cuff Current blood pressure readings readings: 137/93 last night, 138/83 one time, mostly been 130-140/90s  Previous: 126/90, 148/93, 135/83, 144/96, 138/96, 139/95 at home   Patient denies hypotensive s/sx including dizziness, lightheadedness.   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 Not getting restful sleep due to issues with CPAP  machine.  Pt often gets [employer] which he notes may be contributing to high BP    Objective:  BP Readings from Last 3 Encounters:  07/28/24 (!) 143/95  07/06/24 116/80  06/01/24 128/89     Lab Results  Component Value Date   HGBA1C 7.1 (A) 05/18/2024    Lab Results  Component Value Date   CREATININE 1.06 07/28/2024   BUN 18 07/28/2024   NA 142 07/28/2024   K 4.0 07/28/2024   CL 107 07/28/2024   CO2 27 07/28/2024    Lab Results  Component Value Date   CHOL 116 07/06/2024   HDL 28.70 (L) 07/06/2024   LDLCALC 41 07/06/2024   LDLDIRECT 63.0 11/01/2022   TRIG 232.0 (H) 07/06/2024   CHOLHDL 4 07/06/2024    Medications Reviewed Today     Reviewed by Merceda Lela SAUNDERS, RPH (Pharmacist) on 08/11/24 at 1458  Med List Status: <None>   Medication Order Taking? Sig Documenting Provider Last Dose Status Informant  allopurinol  (ZYLOPRIM ) 300 MG tablet [id number]  Take 1 tablet (300 mg total) by mouth daily. James Parrish ONEIDA MADISON, MD  Active   amLODipine  (NORVASC ) 10 MG tablet [id number] Yes TAKE 1 TABLET BY MOUTH AT BEDTIME Joshua Debby CROME, MD  Active   atorvastatin  (LIPITOR) 10 MG tablet [id number]  TAKE 1 TABLET BY MOUTH DAILY Shamleffer, Ibtehal Jaralla, MD  Active   chlorthalidone (HYGROTON) 25 MG tablet [id number] Yes Take 1 tablet (25 mg total) by mouth daily. Joshua Debby CROME, MD  Active   dapagliflozin  propanediol (FARXIGA ) 10 MG TABS tablet [id number]  Take 1 tablet (10 mg total) by mouth daily before breakfast. Shamleffer, Ibtehal Jaralla,  MD  Active   diclofenac (VOLTAREN) 75 MG EC tablet [id number]  Take 75 mg by mouth 2 (two) times daily. [provider]  Active   glucose blood test strip [id number]  Use as instructed to test blood sugar 2 times daily E11.65    Onetouch strips Shamleffer, Ibtehal Jaralla, MD  Active Self  insulin  glargine (LANTUS  SOLOSTAR) 100 UNIT/ML Solostar Pen [id number]  Inject 30 Units into the skin daily. Shamleffer, James Cardinal, MD  Active   Insulin  Pen Needle 29G X MISC [id number]  1 Device by Does not apply route daily in the afternoon. Shamleffer, Ibtehal Jaralla, MD  Active   ketoconazole  (NIZORAL ) 2 % cream [id number]  Apply 1 Application topically 2 (two) times daily. Joshua Debby CROME, MD  Active   losartan  (COZAAR ) 100 MG tablet [id number] Yes TAKE 1 TABLET BY MOUTH DAILY Joshua Debby CROME, MD  Active   metFORMIN  (GLUCOPHAGE ) 1000 MG tablet [id number]  Take 1 tablet (1,000 mg total) by mouth 2 (two) times daily with a meal. Shamleffer, Ibtehal Jaralla, MD  Active   Multiple Vitamin (MULTIVITAMIN WITH MINERALS) TABS tablet [id number]  Take 1 tablet by mouth daily. [provider]  Active Self  nebivolol  (BYSTOLIC ) 10 MG tablet [id number]  TAKE 1 TABLET BY MOUTH DAILY Joshua Debby CROME, MD  Active   Semaglutide , 2 MG/DOSE, (OZEMPIC , 2 MG/DOSE,) 8 MG/3ML SOPN [id number]  DIAL AND INJECT UNDER THE SKIN 2 MG WEEKLY Shamleffer, Ibtehal Jaralla, MD  Active   spironolactone  (ALDACTONE ) 50 MG tablet [id number] Yes TAKE 1 TABLET BY MOUTH DAILY Joshua Debby CROME, MD  Active   tadalafil  (CIALIS ) 20 MG tablet [id number]  TAKE 1 TABLET BY MOUTH DAILY AS NEEDED FOR ERECTILE DYSFUNCTION Joshua Debby CROME, MD  Active   venetoclax  (VENCLEXTA ) 100 MG tablet [id number]  Take 4 tablets (400 mg total) by mouth daily. Tablets should be swallowed whole with a meal and a full glass of water. James Parrish ONEIDA MADISON, MD  Active               Assessment/Plan:   Hypertension: - Currently uncontrolled, BP goal <130/80. BP at home has still been elevated above goal - Reviewed long term cardiovascular and renal outcomes of uncontrolled blood pressure - Reviewed appropriate blood pressure monitoring technique and reviewed goal blood pressure. Recommended to check home blood pressure and heart rate twice daily  - Continue current regimen. Recommended to trial losartan  at bedtime instead of in the AM. - Pt to work on reducing caffeine  intake - Await lab work that is already scheduled through [facility] on 11/10 to check potassium and Scr - F/u in 2 weeks for BP readings  Fatty Liver: - Encouraged focus on lifestyle/dietary changes along with lipid and T2DM control to address fatty liver - Encouraged increased protein/fiber and watching portion sizes of high fat and starchy foods - Pt already on Ozempic  and LDL currently controlled <70   Follow Up Plan: 11/13  Darrelyn Drum, PharmD, BCPS, CPP Clinical Pharmacist Practitioner [place] [facility] at [facility] [phone]

## 2024-08-13 ENCOUNTER — Other Ambulatory Visit: Payer: Self-pay

## 2024-08-13 ENCOUNTER — Other Ambulatory Visit: Payer: Self-pay | Admitting: Hematology and Oncology

## 2024-08-13 DIAGNOSIS — C9110 Chronic lymphocytic leukemia of B-cell type not having achieved remission: Secondary | ICD-10-CM

## 2024-08-13 NOTE — Progress Notes (Signed)
 Specialty Pharmacy Ongoing Clinical Assessment Note  James Parrish is a 58 y.o. male who is being followed by the specialty pharmacy service for RxSp Oncology   Patient's specialty medication(s) reviewed today: Venetoclax  (VENCLEXTA )   Missed doses in the last 4 weeks: 0   Patient/Caregiver did not have any additional questions or concerns.   Therapeutic benefit summary: Patient is achieving benefit   Adverse events/side effects summary: No adverse events/side effects   Patient's therapy is appropriate to: Continue    Goals Addressed             This Visit's Progress    Maintain optimal adherence to therapy   On track    Patient is on track. Patient will maintain adherence         Follow up: 3 months  [facility] Specialty Pharmacist

## 2024-08-13 NOTE — Progress Notes (Signed)
 Specialty Pharmacy Refill Coordination Note  James Parrish is a 58 y.o. male contacted today regarding refills of specialty medication(s) Venetoclax  (VENCLEXTA )   Patient requested Delivery   Delivery date: 08/27/24   Verified address: [address] [place]   Medication will be filled on: 08/26/24

## 2024-08-14 MED ORDER — VENETOCLAX 100 MG PO TABS
400.0000 mg | ORAL_TABLET | Freq: Every day | ORAL | 0 refills | Status: DC
  Filled 2024-08-17 – 2024-08-26 (×2): qty 120, 30d supply, fill #0

## 2024-08-17 ENCOUNTER — Other Ambulatory Visit: Payer: Self-pay

## 2024-08-24 ENCOUNTER — Telehealth: Payer: Self-pay | Admitting: Hematology and Oncology

## 2024-08-24 ENCOUNTER — Inpatient Hospital Stay

## 2024-08-26 ENCOUNTER — Inpatient Hospital Stay: Attending: Hematology and Oncology

## 2024-08-26 ENCOUNTER — Other Ambulatory Visit: Payer: Self-pay

## 2024-08-26 DIAGNOSIS — C9110 Chronic lymphocytic leukemia of B-cell type not having achieved remission: Secondary | ICD-10-CM

## 2024-08-26 LAB — CMP (CANCER CENTER ONLY)
ALT: 18 U/L (ref 0–44)
AST: 17 U/L (ref 15–41)
Albumin: 4.7 g/dL (ref 3.5–5.0)
Alkaline Phosphatase: 52 U/L (ref 38–126)
Anion gap: 9 (ref 5–15)
BUN: 18 mg/dL (ref 6–20)
CO2: 26 mmol/L (ref 22–32)
Calcium: 9.5 mg/dL (ref 8.9–10.3)
Chloride: 104 mmol/L (ref 98–111)
Creatinine: 1.12 mg/dL (ref 0.61–1.24)
GFR, Estimated: >60 mL/min (ref >60)
Glucose, Bld: 123 mg/dL — ABNORMAL HIGH (ref 70–99)
Potassium: 3.6 mmol/L (ref 3.5–5.1)
Sodium: 139 mmol/L (ref 135–145)
Total Bilirubin: 0.5 mg/dL (ref 0.0–1.2)
Total Protein: 7.4 g/dL (ref 6.5–8.1)

## 2024-08-26 LAB — CBC WITH DIFFERENTIAL (CANCER CENTER ONLY)
Abs Immature Granulocytes: 0.03 K/uL (ref 0.00–0.07)
Basophils Absolute: 0 K/uL (ref 0.0–0.1)
Basophils Relative: 0 %
Eosinophils Absolute: 0.1 K/uL (ref 0.0–0.5)
Eosinophils Relative: 2 %
HCT: 48 % (ref 39.0–52.0)
Hemoglobin: 16 g/dL (ref 13.0–17.0)
Immature Granulocytes: 1 %
Lymphocytes Relative: 46 %
Lymphs Abs: 2.6 K/uL (ref 0.7–4.0)
MCH: 25.1 pg — ABNORMAL LOW (ref 26.0–34.0)
MCHC: 33.3 g/dL (ref 30.0–36.0)
MCV: 75.4 fL — ABNORMAL LOW (ref 80.0–100.0)
Monocytes Absolute: 0.5 K/uL (ref 0.1–1.0)
Monocytes Relative: 9 %
Neutro Abs: 2.4 K/uL (ref 1.7–7.7)
Neutrophils Relative %: 42 %
Platelet Count: 249 K/uL (ref 150–400)
RBC: 6.37 MIL/uL — ABNORMAL HIGH (ref 4.22–5.81)
RDW: 15.9 % — ABNORMAL HIGH (ref 11.5–15.5)
WBC Count: 5.7 K/uL (ref 4.0–10.5)
nRBC: 0 % (ref 0.0–0.2)

## 2024-08-26 LAB — URIC ACID: Uric Acid, Serum: 5.1 mg/dL (ref 3.7–8.6)

## 2024-08-26 LAB — LACTATE DEHYDROGENASE: LDH: 147 U/L (ref 105–235)

## 2024-08-27 ENCOUNTER — Other Ambulatory Visit: Admitting: Pharmacist

## 2024-08-27 DIAGNOSIS — I10 Essential (primary) hypertension: Secondary | ICD-10-CM

## 2024-08-27 MED ORDER — CARVEDILOL 12.5 MG PO TABS: 12.5000 mg | ORAL_TABLET | Freq: Two times a day (BID) | ORAL | 0 refills | Status: DC

## 2024-08-27 NOTE — Patient Instructions (Signed)
 It was a pleasure speaking with you today!  Stop nebivolol  and start carvedilol 12.5 mg twice daily.  Continue monitoring blood pressure readings.  Feel free to call with any questions or concerns!  Darrelyn Drum, PharmD, BCPS, CPP Clinical Pharmacist Practitioner [place] [facility] at [facility] [phone]

## 2024-08-27 NOTE — Progress Notes (Signed)
 08/27/2024 Name: James Parrish MRN: [id number] DOB: Jan 29, 1966  Chief Complaint  Patient presents with   Hypertension   Medication Management    James Parrish is a 58 y.o. year old male who presented for a telephone visit.   They were referred to the pharmacist by their PCP for assistance in managing hypertension.    Subjective:  Care Team: Primary Care Provider: Joshua Debby CROME, MD ; Next Scheduled Visit: 10/05/24  Medication Access/Adherence  Current Pharmacy:  [facility] [id number] [place] Par[place], [place] - [address] James Parrish [place] [address] Phone: [phone] Fax: [phone]  James Parrish, [place] - [address] [place] Par[place] [address] Phone: [phone] Fax: [phone]  [facility] [id number] - [place], [place] - [address] [facility] [address] [facility] [address] [place] [address] Phone: [phone] Fax: [phone]  James Parrish - [facility] [address] [place] [address] Phone: [phone] Fax: [phone]   Patient reports affordability concerns with their medications: No  Patient reports access/transportation concerns to their pharmacy: No  Patient reports adherence concerns with their medications:  No     Hypertension:  Current medications: amlodipine  10 mg daily, losartan  100 mg daily, nebivolol  10 mg daily, spironolactone  50 mg daily, chlorthalidone 25 mg daily Previous medication: indapamide  (ineffective)  Patient has a validated, automated, upper arm home BP cuff Current blood pressure readings readings: Last week was 143/93 then 137/91 This week 137/88, last night 137/89   Patient denies hypotensive s/sx including dizziness, lightheadedness.   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 Not getting restful sleep due to issues with CPAP machine.  Pt  has reduced caffeine intake since last  appt    Objective:  BP Readings from Last 3 Encounters:  07/28/24 (!) 143/95  07/06/24 116/80  06/01/24 128/89     Lab Results  Component Value Date   HGBA1C 7.1 (A) 05/18/2024    Lab Results  Component Value Date   CREATININE 1.12 08/26/2024   BUN 18 08/26/2024   NA 139 08/26/2024   K 3.6 08/26/2024   CL 104 08/26/2024   CO2 26 08/26/2024    Lab Results  Component Value Date   CHOL 116 07/06/2024   HDL 28.70 (L) 07/06/2024   LDLCALC 41 07/06/2024   LDLDIRECT 63.0 11/01/2022   TRIG 232.0 (H) 07/06/2024   CHOLHDL 4 07/06/2024    Medications Reviewed Today     Reviewed by James Parrish, RPH (Pharmacist) on 08/27/24 at 1458  Med List Status: <None>   Medication Order Taking? Sig Documenting Provider Last Dose Status Informant  allopurinol  (ZYLOPRIM ) 300 MG tablet [id number]  Take 1 tablet (300 mg total) by mouth daily. James [place] ONEIDA MADISON, MD  Active   amLODipine  (NORVASC ) 10 MG tablet [id number] Yes TAKE 1 TABLET BY MOUTH AT BEDTIME James Debby CROME, MD  Active   atorvastatin  (LIPITOR) 10 MG tablet [id number]  TAKE 1 TABLET BY MOUTH DAILY Parrish, James Jaralla, MD  Active   chlorthalidone (HYGROTON) 25 MG tablet [id number] Yes Take 1 tablet (25 mg total) by mouth daily. James Debby CROME, MD  Active   dapagliflozin  propanediol (FARXIGA ) 10 MG TABS tablet [id number]  Take 1 tablet (10 mg total) by mouth daily before breakfast. Parrish, James Jaralla, MD  Active   diclofenac (VOLTAREN) 75 MG EC tablet [id number]  Take 75 mg by mouth 2 (two) times daily. [provider]  Active   glucose blood test strip [id number]  Use as instructed to test blood sugar 2 times daily E11.65    James Parrish strips Parrish, James Jaralla, MD  Active Self  insulin  glargine (LANTUS  SOLOSTAR) 100 UNIT/ML Solostar Pen [id number]  Inject 30 Units into the skin daily. Parrish, Donell Cardinal, MD  Active   Insulin  Pen Needle 29G X MISC [id number]  1 Device by Does not apply  route daily in the afternoon. Parrish, James Jaralla, MD  Active   ketoconazole  (NIZORAL ) 2 % cream [id number]  Apply 1 Application topically 2 (two) times daily. James Debby CROME, MD  Active   losartan  (COZAAR ) 100 MG tablet [id number] Yes TAKE 1 TABLET BY MOUTH DAILY James Debby CROME, MD  Active   metFORMIN  (GLUCOPHAGE ) 1000 MG tablet [id number]  Take 1 tablet (1,000 mg total) by mouth 2 (two) times daily with a meal. Parrish, James Jaralla, MD  Active   Multiple Vitamin (MULTIVITAMIN WITH MINERALS) TABS tablet [id number]  Take 1 tablet by mouth daily. [provider]  Active Self  nebivolol  (BYSTOLIC ) 10 MG tablet [id number] Yes TAKE 1 TABLET BY MOUTH DAILY James Debby CROME, MD  Active   Semaglutide , 2 MG/DOSE, (OZEMPIC , 2 MG/DOSE,) 8 MG/3ML SOPN [id number]  DIAL AND INJECT UNDER THE SKIN 2 MG WEEKLY Parrish, James Jaralla, MD  Active   spironolactone  (ALDACTONE ) 50 MG tablet [id number] Yes TAKE 1 TABLET BY MOUTH DAILY James Debby CROME, MD  Active   tadalafil  (CIALIS ) 20 MG tablet [id number]  TAKE 1 TABLET BY MOUTH DAILY AS NEEDED FOR ERECTILE DYSFUNCTION James Debby CROME, MD  Active   venetoclax  (VENCLEXTA ) 100 MG tablet [id number]  Take 4 tablets (400 mg total) by mouth daily. Tablets should be swallowed whole with a meal and a full glass of water. James Norleen ONEIDA MADISON, MD  Active               Assessment/Plan:   Hypertension: - Currently uncontrolled, BP goal <130/80. BP at home has still been elevated above goal - Reviewed Parrish term cardiovascular and renal outcomes of uncontrolled blood pressure - Reviewed appropriate blood pressure monitoring technique and reviewed goal blood pressure. Recommended to check home blood pressure and heart rate twice daily  - CMP 11/12 shows Scr stable and K WNL - Stop nebivolol , start carvedilol 12.5 mg twice daily - F/u in 2 weeks for BP readings   Fatty Liver: - Encouraged focus on lifestyle/dietary changes along with lipid and T2DM  control to address fatty liver - Encouraged increased protein/fiber and watching portion sizes of high fat and starchy foods - Pt already on Ozempic  and LDL currently controlled <70   Follow Up Plan: 11/25  Darrelyn Drum, PharmD, BCPS, CPP Clinical Pharmacist Practitioner [place] [facility] at [facility] [phone]

## 2024-09-02 ENCOUNTER — Other Ambulatory Visit: Payer: Self-pay | Admitting: Internal Medicine

## 2024-09-02 DIAGNOSIS — I10 Essential (primary) hypertension: Secondary | ICD-10-CM

## 2024-09-07 ENCOUNTER — Other Ambulatory Visit: Payer: Self-pay | Admitting: Internal Medicine

## 2024-09-07 ENCOUNTER — Other Ambulatory Visit: Payer: Self-pay

## 2024-09-07 MED ORDER — ALLOPURINOL 300 MG PO TABS: 300.0000 mg | ORAL_TABLET | Freq: Every day | ORAL | 3 refills | Status: AC

## 2024-09-08 ENCOUNTER — Other Ambulatory Visit: Admitting: Pharmacist

## 2024-09-08 ENCOUNTER — Telehealth: Payer: Self-pay

## 2024-09-08 DIAGNOSIS — I10 Essential (primary) hypertension: Secondary | ICD-10-CM

## 2024-09-08 NOTE — Telephone Encounter (Signed)
 Copied from CRM [id number]. Topic: General - Other >> Sep 08, 2024  3:44 PM Sasha M wrote: Reason for CRM: Pt had an appt with Tully (pharmacy) and was waiting for her to call him back today. I reached out to CAL and she was told that Tully would reach out at a later date. I informed pt of this and he understood

## 2024-09-08 NOTE — Progress Notes (Signed)
 09/08/2024 Name: James Parrish MRN: [id number] DOB: 1966-03-21  Chief Complaint  Patient presents with   Hypertension   Medication Management    James Parrish is a 58 y.o. year old male who presented for a telephone visit.   They were referred to the pharmacist by their PCP for assistance in managing hypertension.    Subjective:  Care Team: Primary Care Provider: Joshua Debby CROME, MD ; Next Scheduled Visit: 10/05/24  Medication Access/Adherence  Current Pharmacy:  [facility] [id number] [place], [place] - [address] [place] [address] Phone: [phone] Fax: [phone]  [facility] - [place], [place] - [address] [place] [address] Phone: [phone] Fax: [phone]  [facility] [id number] - [place], [place] - [address] [facility] [address] [facility] [address] [place] [address] Phone: [phone] Fax: [phone]  DARRYLE LONG - [facility] [address] [place] [address] Phone: [phone] Fax: [phone]   Patient reports affordability concerns with their medications: No  Patient reports access/transportation concerns to their pharmacy: No  Patient reports adherence concerns with their medications:  No     Hypertension:  Current medications: amlodipine  10 mg daily, losartan  100 mg daily, carvedilol  12.5 mg twice daily, spironolactone  50 mg daily, chlorthalidone  25 mg daily Previous medication: indapamide  (ineffective), nebivolol  (switched to carvedilol  for more BP lowering)  Patient has a validated, automated, upper arm home BP cuff Current blood pressure readings readings:  11/19 125/87 11/20 129/88 11/21 127/87 11/22 126/88 11/23 134/85 11/24 127/92, 134/90  Bps previously 137-140s/90s (prior ot nebivolol  switched to carvedilol   Patient denies hypotensive s/sx including dizziness, lightheadedness.   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 Not getting restful sleep due to issues with CPAP machine.  Pt  has reduced caffeine intake since last appt    Objective:  BP Readings from Last 3 Encounters:  07/28/24 (!) 143/95  07/06/24 116/80  06/01/24 128/89     Lab Results  Component Value Date   HGBA1C 7.1 (A) 05/18/2024    Lab Results  Component Value Date   CREATININE 1.12 08/26/2024   BUN 18 08/26/2024   NA 139 08/26/2024   K 3.6 08/26/2024   CL 104 08/26/2024   CO2 26 08/26/2024    Lab Results  Component Value Date   CHOL 116 07/06/2024   HDL 28.70 (L) 07/06/2024   LDLCALC 41 07/06/2024   LDLDIRECT 63.0 11/01/2022   TRIG 232.0 (H) 07/06/2024   CHOLHDL 4 07/06/2024    Medications Reviewed Today     Reviewed by Merceda Lela SAUNDERS, RPH (Pharmacist) on 09/08/24 at 1558  Med List Status: <None>   Medication Order Taking? Sig Documenting Provider Last Dose Status Informant  allopurinol  (ZYLOPRIM ) 300 MG tablet [id number]  Take 1 tablet (300 mg total) by mouth daily. Federico [place] ONEIDA MADISON, MD  Active   amLODipine  (NORVASC ) 10 MG tablet [id number] Yes TAKE 1 TABLET BY MOUTH AT BEDTIME Joshua Debby CROME, MD  Active   atorvastatin  (LIPITOR) 10 MG tablet [id number]  TAKE 1 TABLET BY MOUTH DAILY Shamleffer, Ibtehal Jaralla, MD  Active   carvedilol  (COREG ) 12.5 MG tablet [id number] Yes Take 1 tablet (12.5 mg total) by mouth 2 (two) times daily with a meal. Joshua Debby CROME, MD  Active   chlorthalidone  (HYGROTON ) 25 MG tablet [id number] Yes Take 1  tablet (25 mg total) by mouth daily. Joshua Debby CROME, MD  Active   dapagliflozin  propanediol (FARXIGA ) 10 MG TABS tablet [id number]  Take 1 tablet (10 mg total) by mouth daily before breakfast. Shamleffer, Ibtehal Jaralla, MD  Active   diclofenac (VOLTAREN) 75 MG EC tablet [id number]  Take 75 mg by mouth 2 (two) times daily. [provider]  Active   glucose blood test strip [id number]  Use as instructed to test blood sugar 2  times daily E11.65    Onetouch strips Shamleffer, Ibtehal Jaralla, MD  Active Self  insulin  glargine (LANTUS  SOLOSTAR) 100 UNIT/ML Solostar Pen [id number]  Inject 30 Units into the skin daily. Shamleffer, Donell Cardinal, MD  Active   Insulin  Pen Needle 29G X MISC [id number]  1 Device by Does not apply route daily in the afternoon. Shamleffer, Donell Cardinal, MD  Active   ketoconazole  (NIZORAL ) 2 % cream [id number]  Apply 1 Application topically 2 (two) times daily. Joshua Debby CROME, MD  Active   losartan  (COZAAR ) 100 MG tablet [id number] Yes TAKE 1 TABLET BY MOUTH DAILY Joshua Debby CROME, MD  Active   metFORMIN  (GLUCOPHAGE ) 1000 MG tablet [id number]  Take 1 tablet (1,000 mg total) by mouth 2 (two) times daily with a meal. Shamleffer, Ibtehal Jaralla, MD  Active   Multiple Vitamin (MULTIVITAMIN WITH MINERALS) TABS tablet [id number]  Take 1 tablet by mouth daily. [provider]  Active Self  Semaglutide , 2 MG/DOSE, (OZEMPIC , 2 MG/DOSE,) 8 MG/3ML SOPN [id number]  DIAL AND INJECT UNDER THE SKIN 2 MG WEEKLY Shamleffer, Ibtehal Jaralla, MD  Active   spironolactone  (ALDACTONE ) 50 MG tablet [id number] Yes TAKE 1 TABLET BY MOUTH DAILY Joshua Debby CROME, MD  Active   tadalafil  (CIALIS ) 20 MG tablet [id number]  TAKE 1 TABLET BY MOUTH DAILY AS NEEDED FOR ERECTILE DYSFUNCTION Joshua Debby CROME, MD  Active   venetoclax  (VENCLEXTA ) 100 MG tablet [id number]  Take 4 tablets (400 mg total) by mouth daily. Tablets should be swallowed whole with a meal and a full glass of water. Federico [place]leen ONEIDA MADISON, MD  Active               Assessment/Plan:   Hypertension: - Currently borderling uncontrolled, BP goal <130/80. Systolic BP now at goal, diastolic remains above goal but has improved to the 80s, down from the 90s - Reviewed long term cardiovascular and renal outcomes of uncontrolled blood pressure - Reviewed appropriate blood pressure monitoring technique and reviewed goal blood pressure. Recommended to check home  blood pressure and heart rate twice daily  -  Continue amlodipine  10 mg daily, losartan  100 mg daily, carvedilol  12.5 mg twice daily, spironolactone  50 mg daily, chlorthalidone  25 mg daily - Will wait for 12/8 and 12/9 OV to review office BP checks from those appts. - If diastolic remains uncontrolled, consider increasing carvedilol  to 25 mg BID, depending on HR  Fatty Liver: - Encouraged focus on lifestyle/dietary changes along with lipid and T2DM control to address fatty liver - Encouraged increased protein/fiber and watching portion sizes of high fat and starchy foods - Pt already on Ozempic  and LDL currently controlled <70   Follow Up Plan: Will check chart for Bps from upcoming appts, PCP f/u 12/22  Darrelyn Drum, PharmD, BCPS, CPP Clinical Pharmacist Practitioner [place] [facility] [phone]

## 2024-09-08 NOTE — Patient Instructions (Signed)
 It was a pleasure speaking with you today!  -  Continue amlodipine  10 mg daily, losartan  100 mg daily, carvedilol  12.5 mg twice daily, spironolactone  50 mg daily, chlorthalidone  25 mg daily - Will wait for 12/8 and 12/9 OV to review office BP checks from those appts.  Feel free to call with any questions or concerns!  Darrelyn Drum, PharmD, BCPS, CPP Clinical Pharmacist Practitioner [place] [facility] at [facility] [phone]

## 2024-09-17 ENCOUNTER — Other Ambulatory Visit: Payer: Self-pay | Admitting: Hematology and Oncology

## 2024-09-17 ENCOUNTER — Other Ambulatory Visit: Payer: Self-pay

## 2024-09-17 DIAGNOSIS — C9110 Chronic lymphocytic leukemia of B-cell type not having achieved remission: Secondary | ICD-10-CM

## 2024-09-17 MED ORDER — VENETOCLAX 100 MG PO TABS
400.0000 mg | ORAL_TABLET | Freq: Every day | ORAL | 0 refills | Status: DC
  Filled 2024-09-18 (×2): qty 120, 30d supply, fill #0

## 2024-09-18 ENCOUNTER — Other Ambulatory Visit: Payer: Self-pay

## 2024-09-18 ENCOUNTER — Other Ambulatory Visit (HOSPITAL_COMMUNITY): Payer: Self-pay

## 2024-09-18 ENCOUNTER — Other Ambulatory Visit: Payer: Self-pay | Admitting: Pharmacy Technician

## 2024-09-18 NOTE — Progress Notes (Signed)
 Specialty Pharmacy Refill Coordination Note  CHI GARLOW is a 58 y.o. male contacted today regarding refills of specialty medication(s) Venetoclax  (VENCLEXTA )   Patient requested Delivery   Delivery date: 09/23/24   Verified address: [address] [place]   Medication will be filled on: 09/22/24

## 2024-09-18 NOTE — Progress Notes (Signed)
 Specialty Pharmacy Refill Coordination Note  James Parrish is a 58 y.o. male contacted today regarding refills of specialty medication(s)Venetoclax  (VENCLEXTA )    Patient requested (Patient-Rptd) Delivery   Delivery date: 09/23/2024 Verified address: (Patient-Rptd) Monday-Friday [address] [place]   Medication will be filled on: 09/22/2024  Left Voicemail for patient to call & confirm address. Pt enter Monday-Friday. Shipping to address on file where we have been shipping last fill.

## 2024-09-21 ENCOUNTER — Other Ambulatory Visit: Payer: Self-pay | Admitting: Physician Assistant

## 2024-09-21 ENCOUNTER — Encounter: Payer: Self-pay | Admitting: Hematology and Oncology

## 2024-09-21 ENCOUNTER — Ambulatory Visit: Admitting: Internal Medicine

## 2024-09-21 DIAGNOSIS — C9110 Chronic lymphocytic leukemia of B-cell type not having achieved remission: Secondary | ICD-10-CM

## 2024-09-21 NOTE — Progress Notes (Deleted)
 Name: James Parrish  Age/ Sex: 58 y.o., male   MRN/ DOB: [id number], 1966/02/10     PCP: Joshua Debby CROME, MD   Reason for Endocrinology Evaluation: Type 2 Diabetes Mellitus  Initial Endocrine Consultative Visit: 07/13/2019    PATIENT IDENTIFIER: James Parrish is a 58 y.o. male with a past medical history of T2Dm, HTN and Dyslipidemia, CLL (12/2021). The patient has followed with Endocrinology clinic since 07/13/2019 for consultative assistance with management of his diabetes.  DIABETIC HISTORY:  James Parrish was diagnosed with DM prior to 2010, was initially on oral glycemic agents, insulin  started in 2013. His hemoglobin A1c has ranged from 7.2% in 2018, peaking at 8.8% in 2019.  On his initial visit to our clinic, his A1c 7.7% . He was on lantus , victoza, metformin  and Jardiance . We switched Victoza to Ozempic .    He is a naval architect    THYROID  HISTORY: He is S/P  left thyroidectomy including substernal thyroid  10/19/2021 with pathology report consistent with benign nodular hyperplasia, negative for malignancy   SUBJECTIVE:   During the last visit (05/18/2024): A1c 6.0%    Today (09/21/2024): James Parrish is here for a  follow up on diabetes.  He has not been checking glucose at home.   He was diagnosed with CLL 12/2021.  He continues to follow-up with oncology , due to rapidly rising white cell count he was started on Obinutuzumab   The patient also receives 100 mg of dexamethasone  during this infusions, will recoieve his last treatment on 8/18th  Than he will start Venclexta  for a year   He did have a transient episode of diarrhea but no vomiting  Had an episode of pruritus that resolved with antihistamine  He has lost weight  Continues with occasional fatigue   HOME DIABETES REGIMEN:  Ozempic  2 mg weekly ( Fridays )  Farxiga  10 mg daily  Metformin  1000 mg Twice daily  Lantus   34 units daily      DIABETIC COMPLICATIONS: Microvascular complications:    Denies:  CKD, retinopathy, neuropathy  Last eye exam: Completed 10/2021   Macrovascular complications:    Denies: CAD, PVD, CVA  METER DOWNLOAD SUMMARY: n/a     HISTORY:  Past Medical History:  Past Medical History:  Diagnosis Date   CLL (chronic lymphocytic leukemia) (HCC)    Complication of anesthesia    hard to wake up    Diabetes mellitus without complication (HCC)    ED (erectile dysfunction)    History of COVID-19 04/2019   Hyperlipidemia    Hypertension    Sleep apnea    uses CPAP nightly   Past Surgical History:  Past Surgical History:  Procedure Laterality Date   BRONCHIAL NEEDLE ASPIRATION BIOPSY  05/29/2021   Procedure: BRONCHIAL NEEDLE ASPIRATION BIOPSIES;  Surgeon: Brenna Adine CROME, DO;  Location: [facility];  Service: Pulmonary;;   COLONOSCOPY     finger surgery Right    Index   KNEE SURGERY Left    KNEE SURGERY Right    ORIF PATELLA Right    REVERSE SHOULDER ARTHROPLASTY Right 08/31/2020   Procedure: REVERSE SHOULDER ARTHROPLASTY;  Surgeon: Cristy Bonner DASEN, MD;  Location: [facility];  Service: Orthopedics;  Laterality: Right;   SHOULDER ARTHROSCOPY WITH ROTATOR CUFF REPAIR Left 02/04/2020   Procedure: SHOULDER ARTHROSCOPY WITH ROTATOR CUFF REPAIR WITH BICEP TENODESIS;  Surgeon: Cristy Bonner DASEN, MD;  Location: [facility];  Service: Orthopedics;  Laterality: Left;  VIDEO BRONCHOSCOPY WITH ENDOBRONCHIAL ULTRASOUND Left 05/29/2021   Procedure: VIDEO BRONCHOSCOPY WITH ENDOBRONCHIAL ULTRASOUND;  Surgeon: Brenna Adine CROME, DO;  Location: [facility];  Service: Pulmonary;  Laterality: Left;   Social History:  reports that he quit smoking about 4 years ago. His smoking use included cigarettes. He started smoking about 29 years ago. He has a 37.5 pack-year smoking history. He has been exposed to tobacco smoke. He has never used smokeless tobacco. He reports that he does not currently use alcohol. He reports that he does not use drugs. Family  History:  Family History  Problem Relation Age of Onset   Cancer Mother        breast cancer    Hypertension Mother    Diabetes Mother    Venous thrombosis Son    Colon cancer Neg Hx    Esophageal cancer Neg Hx    Stomach cancer Neg Hx    Rectal cancer Neg Hx      HOME MEDICATIONS: Allergies as of 09/21/2024       Reactions   Gazyva  [obinutuzumab ] Other (See Comments)   Pt with mild to moderate back pain beginning 2 hours after starting infusion. Resolved with emergency meds. See MAR on 01/13/24.        Medication List        Accurate as of September 21, 2024  1:49 PM. If you have any questions, ask your nurse or doctor.          allopurinol  300 MG tablet Commonly known as: ZYLOPRIM  Take 1 tablet (300 mg total) by mouth daily.   amLODipine  10 MG tablet Commonly known as: NORVASC  TAKE 1 TABLET BY MOUTH AT BEDTIME   atorvastatin  10 MG tablet Commonly known as: LIPITOR TAKE 1 TABLET BY MOUTH DAILY   carvedilol  12.5 MG tablet Commonly known as: COREG  Take 1 tablet (12.5 mg total) by mouth 2 (two) times daily with a meal.   chlorthalidone  25 MG tablet Commonly known as: HYGROTON  Take 1 tablet (25 mg total) by mouth daily.   dapagliflozin  propanediol 10 MG Tabs tablet Commonly known as: Farxiga  Take 1 tablet (10 mg total) by mouth daily before breakfast.   diclofenac 75 MG EC tablet Commonly known as: VOLTAREN Take 75 mg by mouth 2 (two) times daily.   glucose blood test strip Use as instructed to test blood sugar 2 times daily E11.65    Onetouch strips   Insulin  Pen Needle 29G X Misc 1 Device by Does not apply route daily in the afternoon.   ketoconazole  2 % cream Commonly known as: NIZORAL  Apply 1 Application topically 2 (two) times daily.   Lantus  SoloStar 100 UNIT/ML Solostar Pen Generic drug: insulin  glargine Inject 30 Units into the skin daily.   losartan  100 MG tablet Commonly known as: COZAAR  TAKE 1 TABLET BY MOUTH DAILY   metFORMIN   1000 MG tablet Commonly known as: GLUCOPHAGE  Take 1 tablet (1,000 mg total) by mouth 2 (two) times daily with a meal.   multivitamin with minerals Tabs tablet Take 1 tablet by mouth daily.   Ozempic  (2 MG/DOSE) 8 MG/3ML Sopn Generic drug: Semaglutide  (2 MG/DOSE) DIAL AND INJECT UNDER THE SKIN 2 MG WEEKLY   spironolactone  50 MG tablet Commonly known as: ALDACTONE  TAKE 1 TABLET BY MOUTH DAILY   tadalafil  20 MG tablet Commonly known as: CIALIS  TAKE 1 TABLET BY MOUTH DAILY AS NEEDED FOR ERECTILE DYSFUNCTION   venetoclax  100 MG tablet Commonly known as: VENCLEXTA  Take 4 tablets (400 mg  total) by mouth daily. Tablets should be swallowed whole with a meal and a full glass of water.        OBJECTIVE:   Vital Signs: There were no vitals taken for this visit.   Wt Readings from Last 3 Encounters:  07/28/24 266 lb (120.7 kg)  07/06/24 265 lb (120.2 kg)  06/01/24 269 lb 12.8 oz (122.4 kg)     Exam: General: Pt appears well and is in NAD  Lungs: Clear with good BS bilat   Heart: RRR   Extremities: No pretibial edema.   Neuro: MS is good with appropriate affect, pt is alert and Ox3    DM foot exam: 11/18/2023 The skin of the feet is intact without sores or ulcerations. The pedal pulses are 2+ on right and 2+ on left. The sensation is intact to a screening 5.07, 10 gram monofilament bilaterally     DATA REVIEWED:  Lab Results  Component Value Date   HGBA1C 7.1 (A) 05/18/2024   HGBA1C 6.0 (A) 11/18/2023   HGBA1C 6.8 (H) 07/08/2023    Latest Reference Range & Units 05/04/24 09:40  Sodium 135 - 145 mmol/L 141  Potassium 3.5 - 5.1 mmol/L 3.7  Chloride 98 - 111 mmol/L 110  CO2 22 - 32 mmol/L 23  Glucose 70 - 99 mg/dL 873 (H)  BUN 6 - 20 mg/dL 13  Creatinine 9.38 - 8.75 mg/dL 9.05  Calcium  8.9 - 10.3 mg/dL 9.1  Anion gap 5 - 15  8  Alkaline Phosphatase 38 - 126 U/L 55  Albumin 3.5 - 5.0 g/dL 4.5  Uric Acid, Serum 3.7 - 8.6 mg/dL 3.8  AST 15 - 41 U/L 15  ALT 0 -  44 U/L 14  Total Protein 6.5 - 8.1 g/dL 7.1  Total Bilirubin 0.0 - 1.2 mg/dL 0.5  GFR, Est Non African American >60 mL/min >60    In office BG 139 mg/dL   ASSESSMENT / PLAN / RECOMMENDATIONS:   1) Type 2 Diabetes Mellitus,Sub- Optimally controlled, Without  complications - Most recent A1c of 7.1 %. Goal A1c < 7.0 %.     - His A1c has trended up , most likely due to dexamethasone  intake during infusions, he has one more infusion -Discussed the importance of lifestyle changes with low-carb diet and exercise -No changes at this time   MEDICATIONS: -Continue  Ozempic  2 mg weekly  -Continue Farxiga  10 mg daily  mg daily  -Continue Metformin  1000 mg Twice daily  -Continue  Lantus  30 units daily    EDUCATION / INSTRUCTIONS: BG monitoring instructions: Patient is instructed to check his blood sugars 1 time a day. Call [facility] if: BG persistently < 70  I reviewed the Rule of 15 for the treatment of hypoglycemia in detail with the patient. Literature supplied.     F/U in 4 months    Signed electronically by: James Parrish Butts, MD  [facility]  [facility] [address] [place] [place], [place] [address] Phone: [phone] FAX: [phone]   CC: Joshua Debby CROME, MD [address] [place] [address] Phone: [phone]  Fax: [phone]  Return to Endocrinology clinic as below: Future Appointments  Date Time Provider Department Center  09/21/2024  2:20 PM James Parrish, James Redgie, MD [facility] None  09/22/2024 10:15 AM [facility] None  09/22/2024 10:40 AM Neomi Lis T, PA-C [facility] None  09/24/2024  4:05 PM [facility] 5 [facility] H&V  10/05/2024  2:00 PM Joshua Debby CROME,  MD [facility]  10/20/2024 10:30 AM [facility] None  11/16/2024 10:45 AM [facility] None  11/16/2024 11:20 AM Federico Norleen DASEN IV, MD [facility] None  12/15/2024 10:30 AM  [facility] None  01/11/2025 10:30 AM [facility] None  01/11/2025 11:00 AM Federico Norleen DASEN MAD[place], MD [facility] None

## 2024-09-22 ENCOUNTER — Inpatient Hospital Stay: Attending: Hematology and Oncology | Admitting: Physician Assistant

## 2024-09-22 ENCOUNTER — Inpatient Hospital Stay: Attending: Hematology and Oncology

## 2024-09-22 ENCOUNTER — Other Ambulatory Visit: Payer: Self-pay

## 2024-09-22 DIAGNOSIS — C9110 Chronic lymphocytic leukemia of B-cell type not having achieved remission: Secondary | ICD-10-CM | POA: Insufficient documentation

## 2024-09-22 DIAGNOSIS — Z803 Family history of malignant neoplasm of breast: Secondary | ICD-10-CM | POA: Insufficient documentation

## 2024-09-22 DIAGNOSIS — Z87891 Personal history of nicotine dependence: Secondary | ICD-10-CM | POA: Diagnosis not present

## 2024-09-22 LAB — URIC ACID: Uric Acid, Serum: 5.1 mg/dL (ref 3.7–8.6)

## 2024-09-22 LAB — CMP (CANCER CENTER ONLY)
ALT: 21 U/L (ref 0–44)
AST: 21 U/L (ref 15–41)
Albumin: 4.6 g/dL (ref 3.5–5.0)
Alkaline Phosphatase: 57 U/L (ref 38–126)
Anion gap: 12 (ref 5–15)
BUN: 19 mg/dL (ref 6–20)
CO2: 24 mmol/L (ref 22–32)
Calcium: 9.5 mg/dL (ref 8.9–10.3)
Chloride: 103 mmol/L (ref 98–111)
Creatinine: 1.04 mg/dL (ref 0.61–1.24)
GFR, Estimated: >60 mL/min (ref >60)
Glucose, Bld: 130 mg/dL — ABNORMAL HIGH (ref 70–99)
Potassium: 3.7 mmol/L (ref 3.5–5.1)
Sodium: 139 mmol/L (ref 135–145)
Total Bilirubin: 0.3 mg/dL (ref 0.0–1.2)
Total Protein: 7.2 g/dL (ref 6.5–8.1)

## 2024-09-22 LAB — LACTATE DEHYDROGENASE: LDH: 150 U/L (ref 105–235)

## 2024-09-22 LAB — CBC WITH DIFFERENTIAL (CANCER CENTER ONLY)
Abs Immature Granulocytes: 0.06 K/uL (ref 0.00–0.07)
Basophils Absolute: 0 K/uL (ref 0.0–0.1)
Basophils Relative: 0 %
Eosinophils Absolute: 0 K/uL (ref 0.0–0.5)
Eosinophils Relative: 0 %
HCT: 46.5 % (ref 39.0–52.0)
Hemoglobin: 15.6 g/dL (ref 13.0–17.0)
Immature Granulocytes: 1 %
Lymphocytes Relative: 31 %
Lymphs Abs: 1.9 K/uL (ref 0.7–4.0)
MCH: 25.2 pg — ABNORMAL LOW (ref 26.0–34.0)
MCHC: 33.5 g/dL (ref 30.0–36.0)
MCV: 75.1 fL — ABNORMAL LOW (ref 80.0–100.0)
Monocytes Absolute: 0.7 K/uL (ref 0.1–1.0)
Monocytes Relative: 11 %
Neutro Abs: 3.5 K/uL (ref 1.7–7.7)
Neutrophils Relative %: 57 %
Platelet Count: 179 K/uL (ref 150–400)
RBC: 6.19 MIL/uL — ABNORMAL HIGH (ref 4.22–5.81)
RDW: 15.4 % (ref 11.5–15.5)
WBC Count: 6.2 K/uL (ref 4.0–10.5)
nRBC: 0 % (ref 0.0–0.2)

## 2024-09-24 ENCOUNTER — Telehealth (HOSPITAL_COMMUNITY): Payer: Self-pay | Admitting: Internal Medicine

## 2024-09-24 ENCOUNTER — Encounter (HOSPITAL_COMMUNITY): Payer: Self-pay

## 2024-09-24 ENCOUNTER — Ambulatory Visit (HOSPITAL_COMMUNITY)

## 2024-09-24 NOTE — Telephone Encounter (Signed)
 Patient called to cancel his scheduled echocardiogram for today. He did not wish to reschedule. Order will be removed  form the active echo WQ. Thank you.

## 2024-10-04 ENCOUNTER — Other Ambulatory Visit: Payer: Self-pay | Admitting: Internal Medicine

## 2024-10-05 ENCOUNTER — Ambulatory Visit: Admitting: Internal Medicine

## 2024-10-13 ENCOUNTER — Inpatient Hospital Stay

## 2024-10-13 ENCOUNTER — Telehealth: Payer: Self-pay

## 2024-10-13 ENCOUNTER — Inpatient Hospital Stay: Admitting: Hematology and Oncology

## 2024-10-13 ENCOUNTER — Other Ambulatory Visit (HOSPITAL_COMMUNITY): Payer: Self-pay

## 2024-10-13 VITALS — BP 126/89 | HR 86 | Temp 98.4°F | Resp 17 | Ht 74.0 in | Wt 261.0 lb

## 2024-10-13 DIAGNOSIS — C9110 Chronic lymphocytic leukemia of B-cell type not having achieved remission: Secondary | ICD-10-CM | POA: Diagnosis not present

## 2024-10-13 DIAGNOSIS — D72820 Lymphocytosis (symptomatic): Secondary | ICD-10-CM

## 2024-10-13 LAB — CMP (CANCER CENTER ONLY)
ALT: 19 U/L (ref 0–44)
AST: 20 U/L (ref 15–41)
Albumin: 4.7 g/dL (ref 3.5–5.0)
Alkaline Phosphatase: 55 U/L (ref 38–126)
Anion gap: 12 (ref 5–15)
BUN: 12 mg/dL (ref 6–20)
CO2: 24 mmol/L (ref 22–32)
Calcium: 9.3 mg/dL (ref 8.9–10.3)
Chloride: 102 mmol/L (ref 98–111)
Creatinine: 0.97 mg/dL (ref 0.61–1.24)
GFR, Estimated: >60 mL/min
Glucose, Bld: 111 mg/dL — ABNORMAL HIGH (ref 70–99)
Potassium: 3.5 mmol/L (ref 3.5–5.1)
Sodium: 138 mmol/L (ref 135–145)
Total Bilirubin: 0.6 mg/dL (ref 0.0–1.2)
Total Protein: 7.2 g/dL (ref 6.5–8.1)

## 2024-10-13 LAB — LACTATE DEHYDROGENASE: LDH: 133 U/L (ref 105–235)

## 2024-10-13 LAB — CBC WITH DIFFERENTIAL (CANCER CENTER ONLY)
Abs Immature Granulocytes: 0.02 K/uL (ref 0.00–0.07)
Basophils Absolute: 0 K/uL (ref 0.0–0.1)
Basophils Relative: 0 %
Eosinophils Absolute: 0 K/uL (ref 0.0–0.5)
Eosinophils Relative: 0 %
HCT: 46.2 % (ref 39.0–52.0)
Hemoglobin: 15.6 g/dL (ref 13.0–17.0)
Immature Granulocytes: 1 %
Lymphocytes Relative: 46 %
Lymphs Abs: 1.8 K/uL (ref 0.7–4.0)
MCH: 25.2 pg — ABNORMAL LOW (ref 26.0–34.0)
MCHC: 33.8 g/dL (ref 30.0–36.0)
MCV: 74.5 fL — ABNORMAL LOW (ref 80.0–100.0)
Monocytes Absolute: 0.4 K/uL (ref 0.1–1.0)
Monocytes Relative: 10 %
Neutro Abs: 1.6 K/uL — ABNORMAL LOW (ref 1.7–7.7)
Neutrophils Relative %: 43 %
Platelet Count: 243 K/uL (ref 150–400)
RBC: 6.2 MIL/uL — ABNORMAL HIGH (ref 4.22–5.81)
RDW: 15.3 % (ref 11.5–15.5)
WBC Count: 3.8 K/uL — ABNORMAL LOW (ref 4.0–10.5)
nRBC: 0 % (ref 0.0–0.2)

## 2024-10-13 LAB — URIC ACID: Uric Acid, Serum: 4.7 mg/dL (ref 3.7–8.6)

## 2024-10-13 NOTE — Progress Notes (Signed)
 " [name] [phone]   [phone]  PROGRESS NOTE  Patient Care Team: Joshua Debby CROME, MD as PCP - General (Internal Medicine) Shlomo Wilbert SAUNDERS, MD as PCP - Sleep Medicine (Cardiology) Merceda Lela SAUNDERS, RPH-CPP (Pharmacist)  Hematological/Oncological History # CLL, Rai Stage 0. Del 11 01/25/2020: WBC 9.6, Hgb 15.6, MCV 79.1, Plt 241 05/29/2021: WBC 13.3, Hgb 15.4, MCV 76.9, Plt 203 11/16/2021: patient had routine CBC collected, showed WBC 15.9, Hgb 14.8, MCV 76.4, ALC 11.6 12/14/2021: establish care with Dr. Federico  05/24/2023: WBC 43.2, Hgb 14.6, MCV 77.8, Plt 217 11/25/2023: WBC 95.9, Hgb 15.4, MCV 77.8, Plt 221  01/13/2024: start of obintuzumab therapy. Cycle 1 Day 1  02/10/2024: Cycle 2 Day 1 of obinutuzumab . Start of venetoclax  03/09/2024: Cycle 3 Day 1 of obinutuzumab /Venetoclax  04/06/2024: Cycle 4 Day 1 of obinutuzumab /Venetoclax  05/04/2024: Cycle 5 Day 1 of obinutuzumab /Venetoclax  06/01/2024: Cycle 6 Day 1 of obinutuzumab /Venetoclax   Interval History:  James Parrish 58 y.o. male with medical history significant for CLL who presents for a follow up visit. The patient's last visit was on 07/28/2024.  In the interim, he denies any changes to his health.   On exam today James Parrish reports he has been well overall with interim since our last visit.  He reports he had a quiet Christmas and got his wife and espresso machine.  He reports his energy levels are so-so, currently ranking them as 6 out of 10.  His appetite is less than he would like and he is down about 5 pounds since our last visit.  He reports he is tolerating the venetoclax  well with no nausea, vomiting, or diarrhea.  He is having no lightheadedness, dizziness, shortness of breath.  He did get his flu shot this year.  He is having some occasional itchiness on his foot but overall is taking his venetoclax  faithfully and tolerating it well without any difficulty.  A full 10 point ROS is otherwise  negative.  MEDICAL HISTORY:  Past Medical History:  Diagnosis Date   CLL (chronic lymphocytic leukemia) (HCC)    Complication of anesthesia    hard to wake up    Diabetes mellitus without complication [facility])    ED (erectile dysfunction)    History of COVID-19 04/2019   Hyperlipidemia    Hypertension    Sleep apnea    uses CPAP nightly    SURGICAL HISTORY: Past Surgical History:  Procedure Laterality Date   BRONCHIAL NEEDLE ASPIRATION BIOPSY  05/29/2021   Procedure: BRONCHIAL NEEDLE ASPIRATION BIOPSIES;  Surgeon: Brenna Adine CROME, DO;  Location: [facility];  Service: Pulmonary;;   COLONOSCOPY     finger surgery Right    Index   KNEE SURGERY Left    KNEE SURGERY Right    ORIF PATELLA Right    REVERSE SHOULDER ARTHROPLASTY Right 08/31/2020   Procedure: REVERSE SHOULDER ARTHROPLASTY;  Surgeon: Cristy Bonner DASEN, MD;  Location: [facility];  Service: Orthopedics;  Laterality: Right;   SHOULDER ARTHROSCOPY WITH ROTATOR CUFF REPAIR Left 02/04/2020   Procedure: SHOULDER ARTHROSCOPY WITH ROTATOR CUFF REPAIR WITH BICEP TENODESIS;  Surgeon: Cristy Bonner DASEN, MD;  Location: [facility];  Service: Orthopedics;  Laterality: Left;   VIDEO BRONCHOSCOPY WITH ENDOBRONCHIAL ULTRASOUND Left 05/29/2021   Procedure: VIDEO BRONCHOSCOPY WITH ENDOBRONCHIAL ULTRASOUND;  Surgeon: Brenna Adine CROME, DO;  Location: [facility];  Service: Pulmonary;  Laterality: Left;    SOCIAL HISTORY: Social History   Socioeconomic History   Marital status: Married  Spouse name: Not on file   Number of children: Not on file   Years of education: Not on file   Highest education level: 12th grade  Occupational History   Not on file  Tobacco Use   Smoking status: Former    Current packs/day: 0.00    Average packs/day: 1.5 packs/day for 25.0 years (37.5 ttl pk-yrs)    Types: Cigarettes    Start date: 04/1995    Quit date: 04/2020    Years since quitting: 4.5    Passive exposure: Past    Smokeless tobacco: Never  Vaping Use   Vaping status: Never Used  Substance and Sexual Activity   Alcohol use: Not Currently    Comment: occasionally   Drug use: Never   Sexual activity: Yes    Partners: Female  Other Topics Concern   Not on file  Social History Narrative   Not on file   Social Drivers of Health   Tobacco Use: Medium Risk (07/06/2024)   Patient History    Smoking Tobacco Use: Former    Smokeless Tobacco Use: Never    Passive Exposure: Past  Physicist, Medical Strain: Medium Risk (07/04/2024)   Overall Financial Resource Strain (CARDIA)    Difficulty of Paying Living Expenses: Somewhat hard  Food Insecurity: Food Insecurity Present (07/04/2024)   Epic    Worried About Programme Researcher, Broadcasting/film/video in the Last Year: Often true    Ran Out of Food in the Last Year: Sometimes true  Transportation Needs: Unmet Transportation Needs (07/04/2024)   Epic    Lack of Transportation (Medical): Yes    Lack of Transportation (Non-Medical): Yes  Physical Activity: Insufficiently Active (07/04/2024)   Exercise Vital Sign    Days of Exercise per Week: 7 days    Minutes of Exercise per Session: 10 min  Stress: No Stress Concern Present (07/04/2024)   [employer] of Occupational Health - Occupational Stress Questionnaire    Feeling of Stress: Only a little  Social Connections: Moderately Isolated (07/04/2024)   Social Connection and Isolation Panel    Frequency of Communication with Friends and Family: Three times a week    Frequency of Social Gatherings with Friends and Family: Once a week    Attends Religious Services: Never    Database Administrator or Organizations: No    Attends Engineer, Structural: Not on file    Marital Status: Married  Catering Manager Violence: Not on file  Depression (PHQ2-9): Low Risk (05/04/2024)   Depression (PHQ2-9)    PHQ-2 Score: 0  Alcohol Screen: Low Risk (07/04/2024)   Alcohol Screen    Last Alcohol Screening Score (AUDIT): 1   Housing: High Risk (07/04/2024)   Epic    Unable to Pay for Housing in the Last Year: Yes    Number of Times Moved in the Last Year: 0    Homeless in the Last Year: No  Utilities: Not on file  Health Literacy: Not on file    FAMILY HISTORY: Family History  Problem Relation Age of Onset   Cancer Mother        breast cancer    Hypertension Mother    Diabetes Mother    Venous thrombosis Son    Colon cancer Neg Hx    Esophageal cancer Neg Hx    Stomach cancer Neg Hx    Rectal cancer Neg Hx     ALLERGIES:  is allergic to gazyva  [obinutuzumab ].  MEDICATIONS:  Current Outpatient Medications  Medication  Sig Dispense Refill   allopurinol  (ZYLOPRIM ) 300 MG tablet Take 1 tablet (300 mg total) by mouth daily. 30 tablet 3   amLODipine  (NORVASC ) 10 MG tablet TAKE 1 TABLET BY MOUTH AT BEDTIME 90 tablet 1   atorvastatin  (LIPITOR) 10 MG tablet TAKE 1 TABLET BY MOUTH DAILY 30 tablet 0   carvedilol  (COREG ) 12.5 MG tablet Take 1 tablet (12.5 mg total) by mouth 2 (two) times daily with a meal. 180 tablet 0   chlorthalidone  (HYGROTON ) 25 MG tablet Take 1 tablet (25 mg total) by mouth daily. 30 tablet 2   dapagliflozin  propanediol (FARXIGA ) 10 MG TABS tablet Take 1 tablet (10 mg total) by mouth daily before breakfast. 90 tablet 3   diclofenac (VOLTAREN) 75 MG EC tablet Take 75 mg by mouth 2 (two) times daily.     glucose blood test strip Use as instructed to test blood sugar 2 times daily E11.65    Onetouch strips 100 each 12   insulin  glargine (LANTUS  SOLOSTAR) 100 UNIT/ML Solostar Pen Inject 30 Units into the skin daily. 30 mL 6   Insulin  Pen Needle 29G X MISC 1 Device by Does not apply route daily in the afternoon. 100 each 3   ketoconazole  (NIZORAL ) 2 % cream Apply 1 Application topically 2 (two) times daily. 60 g 2   losartan  (COZAAR ) 100 MG tablet TAKE 1 TABLET BY MOUTH DAILY 90 tablet 1   metFORMIN  (GLUCOPHAGE ) 1000 MG tablet Take 1 tablet (1,000 mg total) by mouth 2 (two) times daily  with a meal. 180 tablet 3   Multiple Vitamin (MULTIVITAMIN WITH MINERALS) TABS tablet Take 1 tablet by mouth daily.     Semaglutide , 2 MG/DOSE, (OZEMPIC , 2 MG/DOSE,) 8 MG/3ML SOPN DIAL AND INJECT UNDER THE SKIN 2 MG WEEKLY 3 mL 1   spironolactone  (ALDACTONE ) 50 MG tablet TAKE 1 TABLET BY MOUTH DAILY 90 tablet 1   tadalafil  (CIALIS ) 20 MG tablet TAKE 1 TABLET BY MOUTH DAILY AS NEEDED FOR ERECTILE DYSFUNCTION 10 tablet 2   venetoclax  (VENCLEXTA ) 100 MG tablet Take 4 tablets (400 mg total) by mouth daily. Tablets should be swallowed whole with a meal and a full glass of water. 120 tablet 0   No current facility-administered medications for this visit.    REVIEW OF SYSTEMS:   Constitutional: ( - ) fevers, ( - )  chills , ( - ) night sweats Eyes: ( - ) blurriness of vision, ( - ) double vision, ( - ) watery eyes Ears, nose, mouth, throat, and face: ( - ) mucositis, ( - ) sore throat Respiratory: ( - ) cough, ( - ) dyspnea, ( - ) wheezes Cardiovascular: ( - ) palpitation, ( - ) chest discomfort, ( - ) lower extremity swelling Gastrointestinal:  ( - ) nausea, ( - ) heartburn, ( - ) change in bowel habits Skin: ( - ) abnormal skin rashes Lymphatics: ( - ) new lymphadenopathy, ( - ) easy bruising Neurological: ( - ) numbness, ( - ) tingling, ( - ) new weaknesses Behavioral/Psych: ( - ) mood change, ( - ) new changes  All other systems were reviewed with the patient and are negative.  PHYSICAL EXAMINATION: ECOG PERFORMANCE STATUS: 0 - Asymptomatic  Vitals:   10/13/24 1000 10/13/24 1001  BP: (!) 138/96 126/89  Pulse: 86   Resp: 17   Temp: 98.4 F (36.9 C)   SpO2: 96%    Filed Weights   10/13/24 1000  Weight: 261 lb (118.4 kg)  GENERAL: Well-appearing middle-aged African-American male alert, no distress and comfortable SKIN: skin color, texture, turgor are normal, no rashes or significant lesions EYES: conjunctiva are pink and non-injected, sclera clear NECK: supple,  non-tender LYMPH: Mildly prominent left-sided cervical lymph nodes, no other overt lymphadenopathy noted on exam LUNGS: clear to auscultation and percussion with normal breathing effort HEART: regular rate & rhythm and no murmurs and no lower extremity edema Musculoskeletal: no cyanosis of digits and no clubbing  PSYCH: alert & oriented x 3, fluent speech NEURO: no focal motor/sensory deficits  LABORATORY DATA:  I have reviewed the data as listed    Latest Ref Rng & Units 10/13/2024    9:14 AM 09/22/2024    8:11 AM 08/26/2024    7:19 AM  CBC  WBC 4.0 - 10.5 K/uL 3.8  6.2  5.7   Hemoglobin 13.0 - 17.0 g/dL 84.3  84.3  83.9   Hematocrit 39.0 - 52.0 % 46.2  46.5  48.0   Platelets 150 - 400 K/uL 243  179  249        Latest Ref Rng & Units 10/13/2024    9:14 AM 09/22/2024    8:11 AM 08/26/2024    7:19 AM  CMP  Glucose 70 - 99 mg/dL 888  869  876   BUN 6 - 20 mg/dL 12  19  18    Creatinine 0.61 - 1.24 mg/dL 9.02  8.95  8.87   Sodium 135 - 145 mmol/L 138  139  139   Potassium 3.5 - 5.1 mmol/L 3.5  3.7  3.6   Chloride 98 - 111 mmol/L 102  103  104   CO2 22 - 32 mmol/L 24  24  26    Calcium  8.9 - 10.3 mg/dL 9.3  9.5  9.5   Total Protein 6.5 - 8.1 g/dL 7.2  7.2  7.4   Total Bilirubin 0.0 - 1.2 mg/dL 0.6  0.3  0.5   Alkaline Phos 38 - 126 U/L 55  57  52   AST 15 - 41 U/L 20  21  17    ALT 0 - 44 U/L 19  21  18      RADIOGRAPHIC STUDIES:  No results found.   ASSESSMENT & PLAN James Parrish 58 y.o. male with medical history significant for CLL who presents for a follow up visit.   Due to progression of disease with rapidly rising white blood cell count the patient was started on obinutuzumab /venetoclax  therapy.  This was after discussion of different options including acalabrutinib/zanubrutinib.  The patient preferred the limited duration therapy.  # CLL, Rai Stage 0 --Due to rapidly rising white blood cell count patient was started on obinutuzumab /venetoclax .  Cycle 1 day 1 was  on 01/13/2024. Obinutuzmab to continue x 6 months.  --Patient has started therapy and is tolerating it well. --Labs show white blood cell count 3.8, Hgb 15.6, MCV 74.5, Plt 243   -- Venetoclax  added with cycle 2. Plan to continue x 12 months (April 2026)  --RTC in 8 weeks with interval 4 week labs.    No orders of the defined types were placed in this encounter.   All questions were answered. The patient knows to call the clinic with any problems, questions or concerns.  I have spent a total of 30 minutes minutes of face-to-face and non-face-to-face time, preparing to see the patient,  performing a medically appropriate examination, counseling and educating the patient, documenting clinical information in the electronic health record, and care coordination.  Norleen IVAR Kidney, MD Department of Hematology/Oncology [facility] Phone: [phone] Pager: [phone] Email: [email or web address]   10/13/2024 3:06 PM  Shermon CHRISTELLA Hearing BD, Catovsky D, Caligaris-Cappio F, Dighiero G, Dhner H, Hillmen P, Keating M, Montserrat E, Chiorazzi N, Stilgenbauer S, Rai KR, [place], Eichhorst B, O'Brien S, Robak T, Seymour JF, Kipps TJ. iwCLL guidelines for diagnosis, indications for treatment, response assessment, and supportive management of CLL. Blood. 2018 Jun 21;131(25):2745-2760.  Active disease should be clearly documented to initiate therapy. At least 1 of the following criteria should be met.  1) Evidence of progressive marrow failure as manifested by the development of, or worsening of, anemia and/or thrombocytopenia. Cutoff levels of Hb <10 g/dL or platelet counts <899  109/L are generally regarded as indication for treatment. However, in some patients, platelet counts <100  109/L may remain stable over a long period; this situation does not automatically require therapeutic intervention. 2) Massive (ie, >=6 cm below the left costal margin) or  progressive or symptomatic splenomegaly. 3) Massive nodes (ie, >=10 cm in longest diameter) or progressive or symptomatic lymphadenopathy. 4) Progressive lymphocytosis with an increase of >=50% over a 36-month period, or lymphocyte doubling time (LDT) <6 months. LDT can be obtained by linear regression extrapolation of absolute lymphocyte counts obtained at intervals of 2 weeks over an observation period of 2 to 3 months; patients with initial blood lymphocyte counts <30  109/L may require a longer observation period to determine the LDT. Factors contributing to lymphocytosis other than CLL (eg, infections, steroid administration) should be excluded. 5) Autoimmune complications including anemia or thrombocytopenia poorly responsive to corticosteroids. 6) Symptomatic or functional extranodal involvement (eg, skin, kidney, lung, spine). Disease-related symptoms as defined by any of the following: Unintentional weight loss >=10% within the previous 6 months. Significant fatigue (ie, ECOG performance scale 2 or worse; cannot work or unable to perform usual activities). Fevers >=100.17F or 38.0C for 2 or more weeks without evidence of infection. Night sweats for >=1 month without evidence of infection. "

## 2024-10-13 NOTE — Telephone Encounter (Signed)
 Copied from CRM #[id number]. Topic: General - Other >> Oct 13, 2024  4:16 PM China J wrote: Reason for CRM: Patient was given new blood pressure medication and would like to relay his new readings to [facility]:  12/01:128/83 12/02:128/84 12/03 132/87 12/04: 134/82 12/05: 125/89 12/06: 130/88 12/07: 126/89 12/08: 123/86 12/09: 125/82 12/10: 125/81 12/17: 117/85 12/18: 126/90 12/20:129/83 12/22:123/81 12/23: 123/89 12/27: 122/81 12/30: 126/89

## 2024-10-14 ENCOUNTER — Other Ambulatory Visit: Payer: Self-pay

## 2024-10-14 ENCOUNTER — Other Ambulatory Visit: Payer: Self-pay | Admitting: Hematology and Oncology

## 2024-10-14 ENCOUNTER — Other Ambulatory Visit (HOSPITAL_COMMUNITY): Payer: Self-pay

## 2024-10-14 DIAGNOSIS — C9110 Chronic lymphocytic leukemia of B-cell type not having achieved remission: Secondary | ICD-10-CM

## 2024-10-14 MED ORDER — VENETOCLAX 100 MG PO TABS
400.0000 mg | ORAL_TABLET | Freq: Every day | ORAL | 0 refills | Status: DC
  Filled 2024-10-14 – 2024-10-27 (×9): qty 120, 30d supply, fill #0

## 2024-10-16 ENCOUNTER — Other Ambulatory Visit: Payer: Self-pay

## 2024-10-16 ENCOUNTER — Encounter: Payer: Self-pay | Admitting: Hematology and Oncology

## 2024-10-16 ENCOUNTER — Other Ambulatory Visit: Payer: Self-pay | Admitting: Pharmacist

## 2024-10-16 ENCOUNTER — Other Ambulatory Visit (HOSPITAL_COMMUNITY): Payer: Self-pay

## 2024-10-16 ENCOUNTER — Telehealth: Payer: Self-pay

## 2024-10-16 DIAGNOSIS — I10 Essential (primary) hypertension: Secondary | ICD-10-CM

## 2024-10-16 NOTE — Telephone Encounter (Signed)
 Contacted patient to discuss BP readings.

## 2024-10-16 NOTE — Telephone Encounter (Signed)
 Oral Oncology Patient Advocate Encounter   Received notification that prior authorization for venetoclax  (VENCLEXTA ) 100 MG tablet  is required.   PA submitted on 10/16/24 Key [id number] Status is pending     Lucie Lamer, CPhT [place]  [facility] Oncology Pharmacy Patient Advocate Specialist [facility] [place] Phone: [phone]  Fax: [phone] [email or web address]

## 2024-10-16 NOTE — Progress Notes (Signed)
 "  10/16/2024 Name: James Parrish MRN: [id number] DOB: Nov 12, 1965  Chief Complaint  Patient presents with   Hypertension   Medication Management    James Parrish is a 59 y.o. year old male who presented for a telephone visit.   They were referred to the pharmacist by their PCP for assistance in managing hypertension.    Subjective:  Care Team: Primary Care Provider: Joshua Debby CROME, MD ; Next Scheduled Visit: 10/05/24  Medication Access/Adherence  Current Pharmacy:  [facility] [id number] [place],  - [address] [place] [address] Phone: [phone] Fax: [phone]  [facility] - [place], [place] - [address] [place] [address] Phone: [phone] Fax: [phone]  [facility] [id number] - [place], [place] - [address] [facility] [address] [facility] [address] [place] [address] Phone: [phone] Fax: [phone]  [facility] - [facility] [address] [place] [address] Phone: [phone] Fax: [phone]   Patient reports affordability concerns with their medications: No  Patient reports access/transportation concerns to their pharmacy: No  Patient reports adherence concerns with their medications:  No     Hypertension:  Current medications: amlodipine  10 mg daily, losartan  100 mg daily, carvedilol  12.5 mg twice daily, spironolactone  50 mg daily, chlorthalidone  25 mg daily Previous medication: indapamide  (ineffective), nebivolol  (switched to carvedilol  for more BP lowering)  Patient has a validated, automated, upper arm home BP cuff Current blood pressure readings readings:  12/01:128/83 12/02:128/84 12/03 132/87 12/04: 134/82 12/05: 125/89 12/06: 130/88 12/07: 126/89 12/08: 123/86 12/09: 125/82 12/10: 125/81 12/17: 117/85 12/18: 126/90 12/20:129/83 12/22:123/81 12/23: 123/89 12/27: 122/81 12/30: 126/89  Bps previously  137-140s/90s (prior ot nebivolol  switched to carvedilol   Patient denies hypotensive s/sx including dizziness, lightheadedness.   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 Not getting restful sleep due to issues with CPAP machine.  Pt  has reduced caffeine intake since last appt    Objective:  BP Readings from Last 3 Encounters:  10/13/24 126/89  07/28/24 (!) 143/95  07/06/24 116/80     Lab Results  Component Value Date   HGBA1C 7.1 (A) 05/18/2024    Lab Results  Component Value Date   CREATININE 0.97 10/13/2024   BUN 12 10/13/2024   NA 138 10/13/2024   K 3.5 10/13/2024   CL 102 10/13/2024   CO2 24 10/13/2024    Lab Results  Component Value Date   CHOL 116 07/06/2024   HDL 28.70 (L) 07/06/2024   LDLCALC 41 07/06/2024   LDLDIRECT 63.0 11/01/2022   TRIG 232.0 (H) 07/06/2024   CHOLHDL 4 07/06/2024    Medications Reviewed Today     Reviewed by Merceda Lela SAUNDERS, RPH-CPP (Pharmacist) on 10/16/24 at 1327  Med List Status: <None>   Medication Order Taking? Sig Documenting Provider Last Dose Status Informant  allopurinol  (ZYLOPRIM ) 300 MG tablet [id number] Yes Take 1 tablet (300 mg total) by mouth daily. Federico Norleen ONEIDA MADISON, MD  Active   amLODipine  (NORVASC ) 10 MG tablet [id number] Yes TAKE 1 TABLET BY MOUTH AT BEDTIME Joshua Debby CROME, MD  Active   atorvastatin  (LIPITOR) 10 MG tablet [id number] Yes TAKE 1 TABLET BY MOUTH DAILY Shamleffer, Ibtehal Jaralla, MD  Active   carvedilol  (COREG ) 12.5 MG tablet [id number] Yes Take 1 tablet (12.5 mg total) by mouth 2 (two) times daily with a  meal. Joshua Debby CROME, MD  Active   chlorthalidone  (HYGROTON ) 25 MG tablet [id number] Yes Take 1 tablet (25 mg total) by mouth daily. Joshua Debby CROME, MD  Active   dapagliflozin  propanediol (FARXIGA ) 10 MG TABS tablet [id number] Yes Take 1 tablet (10 mg total) by mouth daily before breakfast. Shamleffer, Ibtehal Jaralla, MD  Active   diclofenac (VOLTAREN) 75 MG  EC tablet [id number] Yes Take 75 mg by mouth 2 (two) times daily. [provider]  Active   glucose blood test strip [id number] Yes Use as instructed to test blood sugar 2 times daily E11.65    Onetouch strips Shamleffer, Ibtehal Jaralla, MD  Active Self  insulin  glargine (LANTUS  SOLOSTAR) 100 UNIT/ML Solostar Pen [id number] Yes Inject 30 Units into the skin daily. Shamleffer, Donell Cardinal, MD  Active   Insulin  Pen Needle 29G X MISC [id number] Yes 1 Device by Does not apply route daily in the afternoon. Shamleffer, Donell Cardinal, MD  Active   ketoconazole  (NIZORAL ) 2 % cream [id number] Yes Apply 1 Application topically 2 (two) times daily. Joshua Debby CROME, MD  Active   losartan  (COZAAR ) 100 MG tablet [id number] Yes TAKE 1 TABLET BY MOUTH DAILY Joshua Debby CROME, MD  Active   metFORMIN  (GLUCOPHAGE ) 1000 MG tablet [id number] Yes Take 1 tablet (1,000 mg total) by mouth 2 (two) times daily with a meal. Shamleffer, Ibtehal Jaralla, MD  Active   Multiple Vitamin (MULTIVITAMIN WITH MINERALS) TABS tablet [id number] Yes Take 1 tablet by mouth daily. [provider]  Active Self  Semaglutide , 2 MG/DOSE, (OZEMPIC , 2 MG/DOSE,) 8 MG/3ML SOPN [id number] Yes DIAL AND INJECT UNDER THE SKIN 2 MG WEEKLY Shamleffer, Ibtehal Jaralla, MD  Active   spironolactone  (ALDACTONE ) 50 MG tablet [id number] Yes TAKE 1 TABLET BY MOUTH DAILY Joshua Debby CROME, MD  Active   tadalafil  (CIALIS ) 20 MG tablet [id number] Yes TAKE 1 TABLET BY MOUTH DAILY AS NEEDED FOR ERECTILE DYSFUNCTION Joshua Debby CROME, MD  Active   venetoclax  (VENCLEXTA ) 100 MG tablet [id number] Yes Take 4 tablets (400 mg total) by mouth daily. Tablets should be swallowed whole with a meal and a full glass of water. Federico Norleen ONEIDA MADISON, MD  Active               Assessment/Plan:   Hypertension: - Currently borderline uncontrolled, BP goal <130/80. Systolic BP now at goal, diastolic remains above goal but has improved to the 80s, down from the 90s -  Reviewed long term cardiovascular and renal outcomes of uncontrolled blood pressure - Reviewed appropriate blood pressure monitoring technique and reviewed goal blood pressure. Recommended to check home blood pressure and heart rate twice daily  -  Continue amlodipine  10 mg daily, losartan  100 mg daily, carvedilol  12.5 mg twice daily, spironolactone  50 mg daily, chlorthalidone  25 mg daily - BP at recent OV was similar to home readings on recheck (systolic at goal, diastolic elevated) - If diastolic remains uncontrolled, consider increasing carvedilol  to 25 mg BID, depending on HR  Fatty Liver: - Encouraged focus on lifestyle/dietary changes along with lipid and T2DM control to address fatty liver - Encouraged increased protein/fiber and watching portion sizes of high fat and starchy foods - Pt already on Ozempic  and LDL currently controlled <70   Follow Up Plan: PCP f/u 1/27  Darrelyn Drum, PharmD, BCPS, CPP Clinical Pharmacist Practitioner [facility] [phone]    "

## 2024-10-19 ENCOUNTER — Encounter: Payer: Self-pay | Admitting: Hematology and Oncology

## 2024-10-19 ENCOUNTER — Other Ambulatory Visit (HOSPITAL_COMMUNITY): Payer: Self-pay

## 2024-10-19 NOTE — Telephone Encounter (Signed)
 Pt left message that he has forms that needed to be completed. Returned call to patient to inquire about forms and left message to return our call.  No ID on voicemail.

## 2024-10-19 NOTE — Telephone Encounter (Signed)
 Oral Oncology Patient Advocate Encounter  Prior Authorization for Venclexta  has been approved.    PA# [id number] Effective dates: 10/16/24 through 04/15/25   Lucie Lamer, CPhT [place]  [facility] Services Oncology Pharmacy Patient Advocate Specialist [facility] [place] Phone: [phone]  Fax: [phone] [email or web address]

## 2024-10-20 ENCOUNTER — Inpatient Hospital Stay

## 2024-10-21 ENCOUNTER — Other Ambulatory Visit (HOSPITAL_COMMUNITY): Payer: Self-pay

## 2024-10-21 ENCOUNTER — Other Ambulatory Visit: Payer: Self-pay

## 2024-10-22 ENCOUNTER — Other Ambulatory Visit (HOSPITAL_COMMUNITY): Payer: Self-pay

## 2024-10-22 ENCOUNTER — Other Ambulatory Visit: Payer: Self-pay

## 2024-10-22 ENCOUNTER — Encounter: Payer: Self-pay | Admitting: Hematology and Oncology

## 2024-10-23 ENCOUNTER — Other Ambulatory Visit: Payer: Self-pay

## 2024-10-23 ENCOUNTER — Other Ambulatory Visit: Payer: Self-pay | Admitting: Internal Medicine

## 2024-10-23 DIAGNOSIS — I10 Essential (primary) hypertension: Secondary | ICD-10-CM

## 2024-10-27 ENCOUNTER — Ambulatory Visit: Admitting: Internal Medicine

## 2024-10-27 ENCOUNTER — Other Ambulatory Visit (HOSPITAL_COMMUNITY): Payer: Self-pay

## 2024-10-27 ENCOUNTER — Encounter: Payer: Self-pay | Admitting: Internal Medicine

## 2024-10-27 ENCOUNTER — Other Ambulatory Visit: Payer: Self-pay

## 2024-10-27 ENCOUNTER — Other Ambulatory Visit

## 2024-10-27 VITALS — BP 144/88 | Ht 74.0 in | Wt 259.0 lb

## 2024-10-27 DIAGNOSIS — Z794 Long term (current) use of insulin: Secondary | ICD-10-CM

## 2024-10-27 DIAGNOSIS — E119 Type 2 diabetes mellitus without complications: Secondary | ICD-10-CM

## 2024-10-27 LAB — POCT GLYCOSYLATED HEMOGLOBIN (HGB A1C): Hemoglobin A1C: 6.6 % — AB (ref 4.0–5.6)

## 2024-10-27 MED ORDER — METFORMIN HCL 1000 MG PO TABS: 1000.0000 mg | ORAL_TABLET | Freq: Two times a day (BID) | ORAL | 3 refills | Status: AC

## 2024-10-27 MED ORDER — LANTUS SOLOSTAR 100 UNIT/ML ~~LOC~~ SOPN: 30.0000 [IU] | PEN_INJECTOR | Freq: Every day | SUBCUTANEOUS | 6 refills | Status: AC

## 2024-10-27 MED ORDER — OZEMPIC (2 MG/DOSE) 8 MG/3ML ~~LOC~~ SOPN: 2.0000 mg | PEN_INJECTOR | SUBCUTANEOUS | 3 refills | Status: AC

## 2024-10-27 MED ORDER — INSULIN PEN NEEDLE 29G X 5MM MISC: 1.0000 | Freq: Every day | 3 refills | Status: AC

## 2024-10-27 MED ORDER — DAPAGLIFLOZIN PROPANEDIOL 10 MG PO TABS: 10.0000 mg | ORAL_TABLET | Freq: Every day | ORAL | 3 refills | Status: AC

## 2024-10-27 NOTE — Progress Notes (Signed)
 Specialty Pharmacy Refill Coordination Note  James Parrish is a 59 y.o. male contacted today regarding refills of specialty medication(s) Venetoclax  (VENCLEXTA )   Patient requested Delivery   Delivery date: 10/29/24   Verified address: [address] [place]   Medication will be filled on: 10/28/24

## 2024-10-27 NOTE — Patient Instructions (Signed)
-   Continue Ozempic  2 mg weekly  - Continue Metformin  1000 mg, 1 tablet twice daily  - Continue Farxiga  10 mg , 1 tablet with Breakfast  - Continue Lantus   30 units daily     HOW TO TREAT LOW BLOOD SUGARS (Blood sugar LESS THAN 70 MG/DL) Please follow the RULE OF 15 for the treatment of hypoglycemia treatment (when your (blood sugars are less than 70 mg/dL)   STEP 1: Take 15 grams of carbohydrates when your blood sugar is low, which includes:  3-4 GLUCOSE TABS  OR 3-4 OZ OF JUICE OR REGULAR SODA OR ONE TUBE OF GLUCOSE GEL    STEP 2: RECHECK blood sugar in 15 MINUTES STEP 3: If your blood sugar is still low at the 15 minute recheck --> then, go back to STEP 1 and treat AGAIN with another 15 grams of carbohydrates.

## 2024-10-27 NOTE — Progress Notes (Signed)
 "        Name: James Parrish  Age/ Sex: 59 y.o., male   MRN/ DOB: [id number], 05-31-1966     PCP: Joshua Debby CROME, MD   Reason for Endocrinology Evaluation: Type 2 Diabetes Mellitus  Initial Endocrine Consultative Visit: 07/13/2019    PATIENT IDENTIFIER: [name] is a 59 y.o. male with a past medical history of T2Dm, HTN and Dyslipidemia, CLL (12/2021). The patient has followed with Endocrinology clinic since 07/13/2019 for consultative assistance with management of his diabetes.  DIABETIC HISTORY:  Mr. [name] was diagnosed with DM prior to 2010, was initially on oral glycemic agents, insulin  started in 2013. His hemoglobin A1c has ranged from 7.2% in 2018, peaking at 8.8% in 2019.  On his initial visit to our clinic, his A1c 7.7% . He was on lantus , victoza, metformin  and Jardiance . We switched Victoza to Ozempic .    He is a naval architect  THYROID  HISTORY: He is S/P  left thyroidectomy including substernal thyroid  10/19/2021 with pathology report consistent with benign nodular hyperplasia, negative for malignancy   SUBJECTIVE:   During the last visit (05/18/2024): A1c 7.1%    Today (10/27/2024): [name] is here for a  follow up on diabetes.  He has not been checking glucose at home.   He was diagnosed with CLL 12/2021, he continues to follow-up with oncology, onobinutuzumab/venetoclax  therapy . Entertaining the idea of discontinuation in 01/2025  Continues with fatigue and gets winded easily  Patient continues with weight loss No nausea or vomiting  No constipation or diarrhea  Has noted left eye blurriness    HOME DIABETES REGIMEN:  Ozempic  2 mg weekly  Farxiga  10 mg daily  Metformin  1000 mg Twice daily  Lantus   30 units daily      DIABETIC COMPLICATIONS: Microvascular complications:    Denies: CKD, retinopathy, neuropathy  Last eye exam: Completed 10/2021   Macrovascular complications:    Denies: CAD, PVD, CVA  METER DOWNLOAD SUMMARY:  n/a     HISTORY:  Past Medical History:  Past Medical History:  Diagnosis Date   CLL (chronic lymphocytic leukemia) (HCC)    Complication of anesthesia    hard to wake up    Diabetes mellitus without complication (HCC)    ED (erectile dysfunction)    History of COVID-19 04/2019   Hyperlipidemia    Hypertension    Sleep apnea    uses CPAP nightly   Past Surgical History:  Past Surgical History:  Procedure Laterality Date   BRONCHIAL NEEDLE ASPIRATION BIOPSY  05/29/2021   Procedure: BRONCHIAL NEEDLE ASPIRATION BIOPSIES;  Surgeon: Brenna Adine CROME, DO;  Location: [facility];  Service: Pulmonary;;   COLONOSCOPY     finger surgery Right    Index   KNEE SURGERY Left    KNEE SURGERY Right    ORIF PATELLA Right    REVERSE SHOULDER ARTHROPLASTY Right 08/31/2020   Procedure: REVERSE SHOULDER ARTHROPLASTY;  Surgeon: Cristy Bonner DASEN, MD;  Location: [facility];  Service: Orthopedics;  Laterality: Right;   SHOULDER ARTHROSCOPY WITH ROTATOR CUFF REPAIR Left 02/04/2020   Procedure: SHOULDER ARTHROSCOPY WITH ROTATOR CUFF REPAIR WITH BICEP TENODESIS;  Surgeon: Cristy Bonner DASEN, MD;  Location: [facility];  Service: Orthopedics;  Laterality: Left;   VIDEO BRONCHOSCOPY WITH ENDOBRONCHIAL ULTRASOUND Left 05/29/2021   Procedure: VIDEO BRONCHOSCOPY WITH ENDOBRONCHIAL ULTRASOUND;  Surgeon: Brenna Adine CROME, DO;  Location: [facility];  Service: Pulmonary;  Laterality: Left;   Social History:  reports that he quit smoking about 4 years ago. His smoking use included cigarettes. He started smoking about 29 years ago. He has a 37.5 pack-year smoking history. He has been exposed to tobacco smoke. He has never used smokeless tobacco. He reports that he does not currently use alcohol. He reports that he does not use drugs. Family History:  Family History  Problem Relation Age of Onset   Cancer Mother        breast cancer    Hypertension Mother    Diabetes Mother    Venous  thrombosis Son    Colon cancer Neg Hx    Esophageal cancer Neg Hx    Stomach cancer Neg Hx    Rectal cancer Neg Hx      HOME MEDICATIONS: Allergies as of 10/27/2024       Reactions   Gazyva  [obinutuzumab ] Other (See Comments)   Pt with mild to moderate back pain beginning 2 hours after starting infusion. Resolved with emergency meds. See MAR on 01/13/24.        Medication List        Accurate as of October 27, 2024  9:22 AM. If you have any questions, ask your nurse or doctor.          allopurinol  300 MG tablet Commonly known as: ZYLOPRIM  Take 1 tablet (300 mg total) by mouth daily.   amLODipine  10 MG tablet Commonly known as: NORVASC  TAKE 1 TABLET BY MOUTH AT BEDTIME   atorvastatin  10 MG tablet Commonly known as: LIPITOR TAKE 1 TABLET BY MOUTH DAILY   carvedilol  12.5 MG tablet Commonly known as: COREG  Take 1 tablet (12.5 mg total) by mouth 2 (two) times daily with a meal.   chlorthalidone  25 MG tablet Commonly known as: HYGROTON  TAKE 1 TABLET BY MOUTH DAILY   dapagliflozin  propanediol 10 MG Tabs tablet Commonly known as: Farxiga  Take 1 tablet (10 mg total) by mouth daily before breakfast.   diclofenac 75 MG EC tablet Commonly known as: VOLTAREN Take 75 mg by mouth 2 (two) times daily.   glucose blood test strip Use as instructed to test blood sugar 2 times daily E11.65    Onetouch strips   Insulin  Pen Needle 29G X Misc 1 Device by Does not apply route daily in the afternoon.   ketoconazole  2 % cream Commonly known as: NIZORAL  Apply 1 Application topically 2 (two) times daily.   Lantus  SoloStar 100 UNIT/ML Solostar Pen Generic drug: insulin  glargine Inject 30 Units into the skin daily.   losartan  100 MG tablet Commonly known as: COZAAR  TAKE 1 TABLET BY MOUTH DAILY   metFORMIN  1000 MG tablet Commonly known as: GLUCOPHAGE  Take 1 tablet (1,000 mg total) by mouth 2 (two) times daily with a meal.   multivitamin with minerals Tabs tablet Take  1 tablet by mouth daily.   Ozempic  (2 MG/DOSE) 8 MG/3ML Sopn Generic drug: Semaglutide  (2 MG/DOSE) DIAL AND INJECT UNDER THE SKIN 2 MG WEEKLY   spironolactone  50 MG tablet Commonly known as: ALDACTONE  TAKE 1 TABLET BY MOUTH DAILY   tadalafil  20 MG tablet Commonly known as: CIALIS  TAKE 1 TABLET BY MOUTH DAILY AS NEEDED FOR ERECTILE DYSFUNCTION   venetoclax  100 MG tablet Commonly known as: VENCLEXTA  Take 4 tablets (400 mg total) by mouth daily. Tablets should be swallowed whole with a meal and a full glass of water.        OBJECTIVE:   Vital Signs: BP (!) 144/88   Ht 6' 2 (1.88  m)   Wt 259 lb (117.5 kg)   BMI 33.25 kg/m    Wt Readings from Last 3 Encounters:  10/27/24 259 lb (117.5 kg)  10/13/24 261 lb (118.4 kg)  07/28/24 266 lb (120.7 kg)     Exam: General: Pt appears well and is in NAD  Lungs: Clear with good BS bilat   Heart: RRR   Extremities: No pretibial edema.   Neuro: MS is good with appropriate affect, pt is alert and Ox3    DM foot exam: 10/27/2024 The skin of the feet is intact without sores or ulcerations. The pedal pulses are 2+ on right and 2+ on left. The sensation is intact to a screening 5.07, 10 gram monofilament bilaterally     DATA REVIEWED:  Lab Results  Component Value Date   HGBA1C 7.1 (A) 05/18/2024   HGBA1C 6.0 (A) 11/18/2023   HGBA1C 6.8 (H) 07/08/2023    Latest Reference Range & Units 10/13/24 09:14  Sodium 135 - 145 mmol/L 138  Potassium 3.5 - 5.1 mmol/L 3.5  Chloride 98 - 111 mmol/L 102  CO2 22 - 32 mmol/L 24  Glucose 70 - 99 mg/dL 888 (H)  BUN 6 - 20 mg/dL 12  Creatinine 9.38 - 8.75 mg/dL 9.02  Calcium  8.9 - 10.3 mg/dL 9.3  Anion gap 5 - 15  12  Alkaline Phosphatase 38 - 126 U/L 55  Albumin 3.5 - 5.0 g/dL 4.7  Uric Acid, Serum 3.7 - 8.6 mg/dL 4.7  AST 15 - 41 U/L 20  ALT 0 - 44 U/L 19  Total Protein 6.5 - 8.1 g/dL 7.2  Total Bilirubin 0.0 - 1.2 mg/dL 0.6  GFR, Est Non African American >60 mL/min >60  LDH 105  - 235 U/L 133     Old records , labs and images have been reviewed.    ASSESSMENT / PLAN / RECOMMENDATIONS:   1) Type 2 Diabetes Mellitus, Optimally controlled, Without  complications - Most recent A1c of 6.6%. Goal A1c < 7.0 %.     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 Call [facility] if: BG persistently < 70  I reviewed the Rule of 15 for the treatment of hypoglycemia in detail with the patient. Literature supplied.     F/U in 6 months    Signed electronically by: Stefano Redgie Butts, MD  [facility] End[facility]ology  [facility] [address] [place] [place], [place] [address] Phone: [phone] FAX: [phone]   CC: Joshua Debby CROME, MD [address] [place] [address] Phone: [phone]  Fax: [phone]  Return to Endocrinology clinic as below: Future Appointments  Date Time Provider Department Center  10/27/2024  9:50 AM Labrian Torregrossa, Donell Redgie, MD [facility] None  11/10/2024  7:30 AM [facility] None  11/10/2024  8:00 AM Joshua Debby CROME, MD [facility]  11/12/2024  3:05 PM [facility] 5 [facility] H&V  12/09/2024  8:15 AM [facility] None  12/09/2024  8:40 AM Federico Norleen ONEIDA MADISON, MD [facility] None  01/06/2025  7:30 AM [facility] None  02/03/2025  7:45 AM [facility] None  02/03/2025  8:20 AM Federico Norleen ONEIDA MADISON, MD [facility] None     "

## 2024-10-27 NOTE — Progress Notes (Signed)
 Specialty Pharmacy Ongoing Clinical Assessment Note  Spoke to patient's wife, James Parrish is a 59 y.o. male who is being followed by the specialty pharmacy service for RxSp Oncology   Patient's specialty medication(s) reviewed today: Venetoclax  (VENCLEXTA )   Missed doses in the last 4 weeks: 0   Patient/Caregiver did not have any additional questions or concerns.   Therapeutic benefit summary: Patient is achieving benefit   Adverse events/side effects summary: No adverse events/side effects   Patient's therapy is appropriate to: Continue    Goals Addressed             This Visit's Progress    Maintain optimal adherence to therapy   On track    Patient is on track. Patient will maintain adherence         Follow up: 3 months  [facility] Specialty Pharmacist

## 2024-10-28 ENCOUNTER — Other Ambulatory Visit: Payer: Self-pay

## 2024-10-28 ENCOUNTER — Ambulatory Visit: Payer: Self-pay | Admitting: Internal Medicine

## 2024-10-28 LAB — MICROALBUMIN / CREATININE URINE RATIO
Creatinine, Urine: 119 mg/dL (ref 20–320)
Microalb Creat Ratio: 2 mg/g{creat}
Microalb, Ur: 0.2 mg/dL

## 2024-11-02 ENCOUNTER — Telehealth: Payer: Self-pay

## 2024-11-02 NOTE — Telephone Encounter (Signed)
 Spoke with regard his form been completed,faxed, and confirmation received. Pt stated that he will pick up his copies tomorrow 11/03/2024.

## 2024-11-03 ENCOUNTER — Other Ambulatory Visit: Payer: Self-pay | Admitting: Internal Medicine

## 2024-11-05 ENCOUNTER — Other Ambulatory Visit: Payer: Self-pay | Admitting: Internal Medicine

## 2024-11-05 DIAGNOSIS — E119 Type 2 diabetes mellitus without complications: Secondary | ICD-10-CM

## 2024-11-05 DIAGNOSIS — I10 Essential (primary) hypertension: Secondary | ICD-10-CM

## 2024-11-10 ENCOUNTER — Telehealth: Payer: Self-pay | Admitting: Hematology and Oncology

## 2024-11-10 ENCOUNTER — Inpatient Hospital Stay

## 2024-11-10 ENCOUNTER — Ambulatory Visit: Admitting: Internal Medicine

## 2024-11-10 ENCOUNTER — Inpatient Hospital Stay: Attending: Hematology and Oncology

## 2024-11-10 NOTE — Telephone Encounter (Signed)
 Pt called about rescheduling an appt

## 2024-11-11 ENCOUNTER — Inpatient Hospital Stay: Attending: Hematology and Oncology

## 2024-11-11 DIAGNOSIS — Z803 Family history of malignant neoplasm of breast: Secondary | ICD-10-CM | POA: Insufficient documentation

## 2024-11-11 DIAGNOSIS — C9110 Chronic lymphocytic leukemia of B-cell type not having achieved remission: Secondary | ICD-10-CM | POA: Diagnosis present

## 2024-11-11 DIAGNOSIS — Z87891 Personal history of nicotine dependence: Secondary | ICD-10-CM | POA: Insufficient documentation

## 2024-11-11 LAB — CMP (CANCER CENTER ONLY)
ALT: 17 U/L (ref 0–44)
AST: 20 U/L (ref 15–41)
Albumin: 4.6 g/dL (ref 3.5–5.0)
Alkaline Phosphatase: 51 U/L (ref 38–126)
Anion gap: 13 (ref 5–15)
BUN: 15 mg/dL (ref 6–20)
CO2: 24 mmol/L (ref 22–32)
Calcium: 9.1 mg/dL (ref 8.9–10.3)
Chloride: 103 mmol/L (ref 98–111)
Creatinine: 1 mg/dL (ref 0.61–1.24)
GFR, Estimated: >60 mL/min
Glucose, Bld: 134 mg/dL — ABNORMAL HIGH (ref 70–99)
Potassium: 3.6 mmol/L (ref 3.5–5.1)
Sodium: 139 mmol/L (ref 135–145)
Total Bilirubin: 0.4 mg/dL (ref 0.0–1.2)
Total Protein: 7 g/dL (ref 6.5–8.1)

## 2024-11-11 LAB — CBC WITH DIFFERENTIAL (CANCER CENTER ONLY)
Abs Immature Granulocytes: 0.02 10*3/uL (ref 0.00–0.07)
Basophils Absolute: 0 10*3/uL (ref 0.0–0.1)
Basophils Relative: 0 %
Eosinophils Absolute: 0 10*3/uL (ref 0.0–0.5)
Eosinophils Relative: 0 %
HCT: 45.1 % (ref 39.0–52.0)
Hemoglobin: 15.3 g/dL (ref 13.0–17.0)
Immature Granulocytes: 0 %
Lymphocytes Relative: 38 %
Lymphs Abs: 2.3 10*3/uL (ref 0.7–4.0)
MCH: 25.2 pg — ABNORMAL LOW (ref 26.0–34.0)
MCHC: 33.9 g/dL (ref 30.0–36.0)
MCV: 74.4 fL — ABNORMAL LOW (ref 80.0–100.0)
Monocytes Absolute: 0.6 10*3/uL (ref 0.1–1.0)
Monocytes Relative: 10 %
Neutro Abs: 3 10*3/uL (ref 1.7–7.7)
Neutrophils Relative %: 52 %
Platelet Count: 197 10*3/uL (ref 150–400)
RBC: 6.06 MIL/uL — ABNORMAL HIGH (ref 4.22–5.81)
RDW: 15 % (ref 11.5–15.5)
WBC Count: 6 10*3/uL (ref 4.0–10.5)
nRBC: 0 % (ref 0.0–0.2)

## 2024-11-12 ENCOUNTER — Ambulatory Visit (HOSPITAL_COMMUNITY)

## 2024-11-13 ENCOUNTER — Ambulatory Visit: Admitting: Internal Medicine

## 2024-11-13 ENCOUNTER — Encounter: Payer: Self-pay | Admitting: Internal Medicine

## 2024-11-13 VITALS — BP 134/86 | HR 92 | Temp 98.2°F | Ht 74.0 in | Wt 259.8 lb

## 2024-11-13 DIAGNOSIS — I10 Essential (primary) hypertension: Secondary | ICD-10-CM

## 2024-11-13 DIAGNOSIS — Z794 Long term (current) use of insulin: Secondary | ICD-10-CM | POA: Diagnosis not present

## 2024-11-13 DIAGNOSIS — E119 Type 2 diabetes mellitus without complications: Secondary | ICD-10-CM | POA: Diagnosis not present

## 2024-11-13 DIAGNOSIS — H538 Other visual disturbances: Secondary | ICD-10-CM | POA: Insufficient documentation

## 2024-11-13 NOTE — Progress Notes (Signed)
 "  Subjective:  Patient ID: James Parrish, male    DOB: 1965-11-24  Age: 59 y.o. MRN: [id number]  CC: Hypertension and Hyperlipidemia   HPI James Parrish presents for f/up ----   Discussed the use of AI scribe software for clinical note transcription with the patient, who gave verbal consent to proceed.  History of Present Illness James Parrish is a 59 year old male with hypertension and leukemia who presents with blurred vision and weight loss.  He has been experiencing blurred vision in his left eye for an extended period. He is uncertain if this symptom is related to his leukemia medication. No headaches are present. His last eye doctor visit was in April, and he plans to schedule another appointment.  He has noticed recent weight loss, which he attributes to a decreased appetite. He often skips breakfast and lunch, eating mainly in the evenings. He believes this change in appetite is due to his medication. No symptoms related to blood sugar levels.  No nausea, vomiting, constipation, or muscle and joint aches, except for occasional arthritis pain in his hands. This pain occurs in both hands, particularly in the joints, and has been present for quite some time.  He received a COVID vaccine last year but has not received the latest one. He is unsure about his hepatitis B vaccination status.     Outpatient Medications Prior to Visit  Medication Sig Dispense Refill   allopurinol  (ZYLOPRIM ) 300 MG tablet Take 1 tablet (300 mg total) by mouth daily. 30 tablet 3   amLODipine  (NORVASC ) 10 MG tablet TAKE 1 TABLET BY MOUTH AT BEDTIME 90 tablet 1   atorvastatin  (LIPITOR) 10 MG tablet TAKE 1 TABLET BY MOUTH DAILY 30 tablet 3   carvedilol  (COREG ) 12.5 MG tablet Take 1 tablet (12.5 mg total) by mouth 2 (two) times daily with a meal. 180 tablet 0   chlorthalidone  (HYGROTON ) 25 MG tablet TAKE 1 TABLET BY MOUTH DAILY 30 tablet 2   dapagliflozin  propanediol (FARXIGA ) 10 MG TABS tablet Take 1  tablet (10 mg total) by mouth daily before breakfast. 90 tablet 3   diclofenac (VOLTAREN) 75 MG EC tablet Take 75 mg by mouth 2 (two) times daily.     glucose blood test strip Use as instructed to test blood sugar 2 times daily E11.65    Onetouch strips 100 each 12   insulin  glargine (LANTUS  SOLOSTAR) 100 UNIT/ML Solostar Pen Inject 30 Units into the skin daily. 30 mL 6   Insulin  Pen Needle 29G X MISC 1 Device by Does not apply route daily in the afternoon. 100 each 3   ketoconazole  (NIZORAL ) 2 % cream Apply 1 Application topically 2 (two) times daily. 60 g 2   losartan  (COZAAR ) 100 MG tablet TAKE 1 TABLET BY MOUTH DAILY 90 tablet 1   metFORMIN  (GLUCOPHAGE ) 1000 MG tablet Take 1 tablet (1,000 mg total) by mouth 2 (two) times daily with a meal. 180 tablet 3   Multiple Vitamin (MULTIVITAMIN WITH MINERALS) TABS tablet Take 1 tablet by mouth daily.     Semaglutide , 2 MG/DOSE, (OZEMPIC , 2 MG/DOSE,) 8 MG/3ML SOPN 2 mg by Other route once a week. 9 mL 3   spironolactone  (ALDACTONE ) 50 MG tablet TAKE 1 TABLET BY MOUTH DAILY 90 tablet 1   tadalafil  (CIALIS ) 20 MG tablet TAKE 1 TABLET BY MOUTH DAILY AS NEEDED FOR ERECTILE DYSFUNCTION 10 tablet 2   venetoclax  (VENCLEXTA ) 100 MG tablet Take 4 tablets (400 mg total) by  mouth daily. Tablets should be swallowed whole with a meal and a full glass of water. 120 tablet 0   No facility-administered medications prior to visit.    ROS Review of Systems  Constitutional:  Negative for appetite change, chills, diaphoresis, fatigue and fever.  HENT: Negative.    Eyes:  Positive for visual disturbance. Negative for photophobia, pain, redness and itching.  Respiratory:  Positive for apnea. Negative for cough, chest tightness, shortness of breath and wheezing.   Cardiovascular:  Negative for chest pain, palpitations and leg swelling.  Gastrointestinal: Negative.  Negative for abdominal pain, constipation, diarrhea, nausea and vomiting.  Endocrine: Negative.    Genitourinary: Negative.  Negative for difficulty urinating.  Musculoskeletal:  Positive for arthralgias. Negative for myalgias.  Skin: Negative.   Neurological: Negative.  Negative for dizziness, weakness and light-headedness.  Hematological:  Negative for adenopathy. Does not bruise/bleed easily.  Psychiatric/Behavioral: Negative.      Objective:  BP 134/86 (BP Location: Left Arm, Patient Position: Sitting, Cuff Size: Normal)   Pulse 92   Temp 98.2 F (36.8 C) (Oral)   Ht 6' 2 (1.88 m)   Wt 259 lb 12.8 oz (117.8 kg)   SpO2 96%   BMI 33.36 kg/m   BP Readings from Last 3 Encounters:  11/13/24 134/86  10/27/24 (!) 144/88  10/13/24 126/89    Wt Readings from Last 3 Encounters:  11/13/24 259 lb 12.8 oz (117.8 kg)  10/27/24 259 lb (117.5 kg)  10/13/24 261 lb (118.4 kg)    Physical Exam Vitals reviewed.  Constitutional:      Appearance: Normal appearance.  HENT:     Mouth/Throat:     Mouth: Mucous membranes are moist.  Eyes:     General: No scleral icterus.    Conjunctiva/sclera: Conjunctivae normal.     Right eye: Right conjunctiva is not injected. No exudate or hemorrhage.    Left eye: Left conjunctiva is not injected. No exudate or hemorrhage. Cardiovascular:     Rate and Rhythm: Normal rate and regular rhythm.     Heart sounds: No murmur heard. Pulmonary:     Effort: Pulmonary effort is normal.     Breath sounds: No stridor. No wheezing, rhonchi or rales.  Abdominal:     General: Abdomen is flat.     Palpations: There is no mass.     Tenderness: There is no abdominal tenderness. There is no guarding.     Hernia: No hernia is present.  Musculoskeletal:        General: Deformity present. No swelling or tenderness. Normal range of motion.     Right hand: Deformity present. No swelling, tenderness or bony tenderness. Normal range of motion.     Left hand: Deformity present. No swelling, tenderness or bony tenderness. Normal range of motion.     Cervical back:  Neck supple.     Right lower leg: No edema.     Left lower leg: No edema.     Comments: DJD in MCP joints No swelling, effusions, tenderness, warmth, crepitance  Lymphadenopathy:     Cervical: No cervical adenopathy.  Skin:    General: Skin is warm and dry.  Neurological:     General: No focal deficit present.     Mental Status: He is alert.  Psychiatric:        Mood and Affect: Mood normal.        Behavior: Behavior normal.     Lab Results  Component Value Date   WBC 6.0  11/11/2024   HGB 15.3 11/11/2024   HCT 45.1 11/11/2024   PLT 197 11/11/2024   GLUCOSE 134 (H) 11/11/2024   CHOL 116 07/06/2024   TRIG 232.0 (H) 07/06/2024   HDL 28.70 (L) 07/06/2024   LDLDIRECT 63.0 11/01/2022   LDLCALC 41 07/06/2024   ALT 17 11/11/2024   AST 20 11/11/2024   NA 139 11/11/2024   K 3.6 11/11/2024   CL 103 11/11/2024   CREATININE 1.00 11/11/2024   BUN 15 11/11/2024   CO2 24 11/11/2024   TSH 1.84 07/06/2024   PSA 0.51 07/06/2024   HGBA1C 6.6 (A) 10/27/2024   MICROALBUR 0.2 10/27/2024    CT ABDOMEN PELVIS W CONTRAST Result Date: 07/16/2024 EXAM: CT ABDOMEN AND PELVIS WITH CONTRAST 07/16/2024 04:00:00 PM TECHNIQUE: CT of the abdomen and pelvis was performed with the administration of 80 mL of iopamidol  (ISOVUE -370) 76% injection. Multiplanar reformatted images are provided for review. Automated exposure control, iterative reconstruction, and/or weight-based adjustment of the mA/kV was utilized to reduce the radiation dose to as low as reasonably achievable. COMPARISON: Radiographs 07/06/2024 and PET CT 02/06/2021. CLINICAL HISTORY: Flank pain, elevated lipase, CLL on oral XRT. FINDINGS: LOWER CHEST: Linear subsegmental atelectasis or scarring in both lower lobes. LIVER: Suspected diffuse hepatic steatosis. GALLBLADDER AND BILE DUCTS: Gallbladder is unremarkable. No biliary ductal dilatation. SPLEEN: No acute abnormality. PANCREAS: No acute abnormality. ADRENAL GLANDS: No acute abnormality.  KIDNEYS, URETERS AND BLADDER: 1.3 cm simple left renal cyst noted on image 31 series 2. No further imaging workup of this lesion is indicated. An additional subcentimeter hypodense lesion in the left kidney is likely a cyst and warrants no further imaging workup. No stones in the kidneys or ureters. No hydronephrosis. No perinephric or periureteral stranding. Urinary bladder is unremarkable. GI AND BOWEL: Stomach demonstrates no acute abnormality. There is no bowel obstruction. PERITONEUM AND RETROPERITONEUM: No ascites. No free air. VASCULATURE: Aorta is normal in caliber. LYMPH NODES: No lymphadenopathy. REPRODUCTIVE ORGANS: No acute abnormality. BONES AND SOFT TISSUES: Lumbar spondylosis and degenerative disc disease with suspected moderate bilateral foraminal impingement at L3-L4 and moderate right foraminal impingement at L4-L5. Possible left foraminal impingement due to facet spurring at T11-T12. Moderate to prominent degenerative hip arthropathy bilaterally. No acute osseous abnormality. No focal soft tissue abnormality. IMPRESSION: 1. No acute findings in the abdomen or pelvis. 2. Suspected diffuse hepatic steatosis. 3. Lumbar spondylosis and degenerative disc disease with moderate bilateral foraminal impingement at L3L4 and moderate right foraminal impingement at L4L5. Possible left foraminal impingement at T11T12 due to facet spurring. 4. Moderate to prominent bilateral hip degenerative arthropathy. 5. Linear subsegmental atelectasis or scarring in both lower lobes. Electronically signed by: Ryan Salvage MD 07/16/2024 04:46 PM EDT RP Workstation: [id number]    Assessment & Plan:  Blurred vision, left eye -     Ambulatory referral to Ophthalmology  Type 2 diabetes mellitus without complication, with long-term current use of insulin  (HCC)- Blood sugar is well controlled. -     Ambulatory referral to Ophthalmology  Primary hypertension- BP is well controlled.     Follow-up: No  follow-ups on file.  Debby Molt, MD "

## 2024-11-16 ENCOUNTER — Inpatient Hospital Stay

## 2024-11-16 ENCOUNTER — Inpatient Hospital Stay: Admitting: Hematology and Oncology

## 2024-11-19 ENCOUNTER — Other Ambulatory Visit: Payer: Self-pay | Admitting: Internal Medicine

## 2024-11-19 ENCOUNTER — Telehealth: Payer: Self-pay | Admitting: Pharmacist

## 2024-11-19 DIAGNOSIS — R0989 Other specified symptoms and signs involving the circulatory and respiratory systems: Secondary | ICD-10-CM | POA: Insufficient documentation

## 2024-11-19 DIAGNOSIS — I10 Essential (primary) hypertension: Secondary | ICD-10-CM

## 2024-11-19 MED ORDER — CARVEDILOL 25 MG PO TABS: 25.0000 mg | ORAL_TABLET | Freq: Two times a day (BID) | ORAL | 1 refills | Status: AC

## 2024-11-19 NOTE — Telephone Encounter (Signed)
 He should under go a CT angio of the aorta to look for TAA

## 2024-11-19 NOTE — Progress Notes (Signed)
 "  11/19/2024 Name: [place] Parrish MRN: [id number] DOB: [birth year]  Chief Complaint  Patient presents with   Hypertension   Medication Management    James Parrish is a 59 y.o. year old male who presented for a telephone visit.   They were referred to the pharmacist by their PCP for assistance in managing hypertension.    Subjective:  Care Team: Primary Care Provider: Joshua Debby CROME, MD ; Next Scheduled Visit: none scheduled  Medication Access/Adherence  Current Pharmacy:  [facility] [id number] [place], [place] - [address] [place] [address] Phone: [phone] Fax: [phone]  [facility] - [place], [place] - [address] [place] [address] Phone: [phone] Fax: [phone]  [facility] [id number] - [place], [place] - [address] [place] [address] Phone: [phone] Fax: [phone]  DARR[facility] LONG - [facility] [address] [place] [address] Phone: [phone] Fax: [phone]   Patient reports affordability concerns with their medications: No  Patient reports access/transportation concerns to their pharmacy: No  Patient reports adherence concerns with their medications:  No     Hypertension:  Current medications: amlodipine  10 mg daily, losartan  100 mg daily, carvedilol  12.5 mg twice daily, spironolactone  50 mg daily, chlorthalidone  25 mg daily Previous medication: indapamide  (ineffective), nebivolol  (switched to carvedilol  for more BP lowering)  Patient has a validated, automated, upper arm home BP cuff Current blood pressure readings readings:  Generally 127/87, 121/81 117/81 happened twice  BP tends to be higher on the left arm (one time was 147 systolic on the left and 116 on the right)   Bps previously 137-140s/90s (prior ot nebivolol  switched to carvedilol )  Patient denies hypotensive s/sx  including dizziness, lightheadedness.   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 Not getting restful sleep due to issues with CPAP machine.  Pt  has reduced caffeine intake since last appt    Objective:  BP Readings from Last 3 Encounters:  11/13/24 134/86  10/27/24 (!) 144/88  10/13/24 126/89     Lab Results  Component Value Date   HGBA1C 6.6 (A) 10/27/2024    Lab Results  Component Value Date   CREATININE 1.00 11/11/2024   BUN 15 11/11/2024   NA 139 11/11/2024   K 3.6 11/11/2024   CL 103 11/11/2024   CO2 24 11/11/2024    Lab Results  Component Value Date   CHOL 116 07/06/2024   HDL 28.70 (L) 07/06/2024   LDLCALC 41 07/06/2024   LDLDIRECT 63.0 11/01/2022   TRIG 232.0 (H) 07/06/2024   CHOLHDL 4 07/06/2024    Medications Reviewed Today     Reviewed by Merceda Lela SAUNDERS, RPH-CPP (Pharmacist) on 11/19/24 at 1154  Med List Status: <None>   Medication Order Taking? Sig Documenting Provider Last Dose Status Informant  allopurinol  (ZYLOPRIM ) 300 MG tablet [id number] Yes Take 1 tablet (300 mg total) by mouth daily. Federico Norleen ONEIDA MADISON, MD  Active   amLODipine  (NORVASC ) 10 MG tablet [id number] Yes TAKE 1 TABLET BY MOUTH AT BEDTIME Joshua Debby CROME, MD  Active   atorvastatin  (LIPITOR) 10 MG tablet [id number] Yes TAKE 1 TABLET BY MOUTH DAILY Shamleffer, Ibtehal Jaralla, MD  Active   carvedilol  (COREG ) 12.5 MG tablet [id number]  TAKE 1 TABLET BY MOUTH 2 TIMES A DAY WITH A MEAL Joshua,  Debby CROME, MD  Active   chlorthalidone  (HYGROTON ) 25 MG tablet [id number] Yes TAKE 1 TABLET BY MOUTH DAILY Joshua Debby CROME, MD  Active   dapagliflozin  propanediol (FARXIGA ) 10 MG TABS tablet [id number] Yes Take 1 tablet (10 mg total) by mouth daily before breakfast. Shamleffer, Ibtehal Jaralla, MD  Active   diclofenac (VOLTAREN) 75 MG EC tablet [id number] Yes Take 75 mg by mouth 2 (two) times daily. [provider]  Active   glucose blood test strip  [id number] Yes Use as instructed to test blood sugar 2 times daily E11.65    Onetouch strips Shamleffer, Ibtehal Jaralla, MD  Active Self  insulin  glargine (LANTUS  SOLOSTAR) 100 UNIT/ML Solostar Pen [id number] Yes Inject 30 Units into the skin daily. Shamleffer, Donell Cardinal, MD  Active   Insulin  Pen Needle 29G X MISC [id number] Yes 1 Device by Does not apply route daily in the afternoon. Shamleffer, Ibtehal Jaralla, MD  Active   ketoconazole  (NIZORAL ) 2 % cream [id number] Yes Apply 1 Application topically 2 (two) times daily. Joshua Debby CROME, MD  Active   losartan  (COZAAR ) 100 MG tablet [id number] Yes TAKE 1 TABLET BY MOUTH DAILY Joshua Debby CROME, MD  Active   metFORMIN  (GLUCOPHAGE ) 1000 MG tablet [id number] Yes Take 1 tablet (1,000 mg total) by mouth 2 (two) times daily with a meal. Shamleffer, Ibtehal Jaralla, MD  Active   Multiple Vitamin (MULTIVITAMIN WITH MINERALS) TABS tablet [id number] Yes Take 1 tablet by mouth daily. [provider]  Active Self  Semaglutide , 2 MG/DOSE, (OZEMPIC , 2 MG/DOSE,) 8 MG/3ML SOPN [id number] Yes 2 mg by Other route once a week. Shamleffer, Ibtehal Jaralla, MD  Active   spironolactone  (ALDACTONE ) 50 MG tablet [id number] Yes TAKE 1 TABLET BY MOUTH DAILY Joshua Debby CROME, MD  Active   tadalafil  (CIALIS ) 20 MG tablet [id number] Yes TAKE 1 TABLET BY MOUTH DAILY AS NEEDED FOR ERECTILE DYSFUNCTION Joshua Debby CROME, MD  Active   venetoclax  (VENCLEXTA ) 100 MG tablet [id number] Yes Take 4 tablets (400 mg total) by mouth daily. Tablets should be swallowed whole with a meal and a full glass of water. Federico Norleen ONEIDA MADISON, MD  Active               Assessment/Plan:   Hypertension: - Currently borderline uncontrolled, BP goal <130/80. Systolic BP now at goal, diastolic remains above goal but has improved to the 80s, down from the 90s. HR tends to be in the 80-90s - Reviewed long term cardiovascular and renal outcomes of uncontrolled blood pressure - Reviewed appropriate  blood pressure monitoring technique and reviewed goal blood pressure. Recommended to check home blood pressure and heart rate twice daily  -  Continue amlodipine  10 mg daily, losartan  100 mg daily, carvedilol  12.5 mg twice daily, spironolactone  50 mg daily, chlorthalidone  25 mg daily - Increase carvedilol  to 25 mg twice daily  - Consulted with PCP regarding significant BP difference between right and left arms. PCP ordered CT angio. Pt notified.  Fatty Liver: - Encouraged focus on lifestyle/dietary changes along with lipid and T2DM control to address fatty liver - Encouraged increased protein/fiber and watching portion sizes of high fat and starchy foods - Pt already on Ozempic  and LDL currently controlled <70   Follow Up Plan: 2-3 weeks  Darrelyn Drum, PharmD, BCPS, CPP Clinical Pharmacist Practitioner  Primary Care at [facility] [phone]    "

## 2024-11-20 ENCOUNTER — Other Ambulatory Visit: Payer: Self-pay

## 2024-11-20 ENCOUNTER — Other Ambulatory Visit (HOSPITAL_COMMUNITY): Payer: Self-pay

## 2024-11-20 ENCOUNTER — Other Ambulatory Visit: Payer: Self-pay | Admitting: Hematology and Oncology

## 2024-11-20 DIAGNOSIS — C9110 Chronic lymphocytic leukemia of B-cell type not having achieved remission: Secondary | ICD-10-CM

## 2024-11-20 MED ORDER — VENETOCLAX 100 MG PO TABS
400.0000 mg | ORAL_TABLET | Freq: Every day | ORAL | 0 refills | Status: AC
  Filled 2024-11-20: qty 120, 30d supply, fill #0

## 2024-12-03 ENCOUNTER — Other Ambulatory Visit

## 2024-12-07 ENCOUNTER — Inpatient Hospital Stay: Attending: Hematology and Oncology

## 2024-12-09 ENCOUNTER — Inpatient Hospital Stay

## 2024-12-09 ENCOUNTER — Inpatient Hospital Stay: Admitting: Hematology and Oncology

## 2024-12-10 ENCOUNTER — Other Ambulatory Visit

## 2024-12-11 ENCOUNTER — Ambulatory Visit (HOSPITAL_COMMUNITY)

## 2024-12-15 ENCOUNTER — Inpatient Hospital Stay

## 2025-01-04 ENCOUNTER — Inpatient Hospital Stay: Attending: Hematology and Oncology

## 2025-01-06 ENCOUNTER — Inpatient Hospital Stay

## 2025-01-11 ENCOUNTER — Inpatient Hospital Stay

## 2025-01-11 ENCOUNTER — Inpatient Hospital Stay: Admitting: Hematology and Oncology

## 2025-02-01 ENCOUNTER — Inpatient Hospital Stay: Attending: Hematology and Oncology

## 2025-02-03 ENCOUNTER — Inpatient Hospital Stay

## 2025-02-03 ENCOUNTER — Inpatient Hospital Stay: Admitting: Hematology and Oncology

## 2025-05-03 ENCOUNTER — Ambulatory Visit: Admitting: Internal Medicine
